# Patient Record
Sex: Female | Born: 1985 | Race: White | Hispanic: No | Marital: Single | Smoking: Never smoker
Health system: Southern US, Community
[De-identification: ages and names within clinical notes are randomized; demographics above are authoritative.]

## PROBLEM LIST (undated history)

## (undated) DIAGNOSIS — L89151 Pressure ulcer of sacral region, stage 1: Secondary | ICD-10-CM

## (undated) DIAGNOSIS — S14109A Unspecified injury at unspecified level of cervical spinal cord, initial encounter: Secondary | ICD-10-CM

## (undated) DIAGNOSIS — L8990 Pressure ulcer of unspecified site, unspecified stage: Secondary | ICD-10-CM

## (undated) DIAGNOSIS — L89302 Pressure ulcer of unspecified buttock, stage 2: Secondary | ICD-10-CM

## (undated) DIAGNOSIS — M6250 Muscle wasting and atrophy, not elsewhere classified, unspecified site: Secondary | ICD-10-CM

## (undated) DIAGNOSIS — A419 Sepsis, unspecified organism: Secondary | ICD-10-CM

## (undated) DIAGNOSIS — M869 Osteomyelitis, unspecified: Secondary | ICD-10-CM

## (undated) DIAGNOSIS — Z95828 Presence of other vascular implants and grafts: Secondary | ICD-10-CM

## (undated) DIAGNOSIS — G904 Autonomic dysreflexia: Secondary | ICD-10-CM

## (undated) DIAGNOSIS — Z85820 Personal history of malignant melanoma of skin: Secondary | ICD-10-CM

## (undated) DIAGNOSIS — Q6602 Congenital talipes equinovarus, left foot: Secondary | ICD-10-CM

## (undated) DIAGNOSIS — N390 Urinary tract infection, site not specified: Secondary | ICD-10-CM

## (undated) DIAGNOSIS — F339 Major depressive disorder, recurrent, unspecified: Secondary | ICD-10-CM

## (undated) DIAGNOSIS — G8250 Quadriplegia, unspecified: Secondary | ICD-10-CM

## (undated) DIAGNOSIS — T83511A Infection and inflammatory reaction due to indwelling urethral catheter, initial encounter: Secondary | ICD-10-CM

## (undated) DIAGNOSIS — R896 Abnormal cytological findings in specimens from other organs, systems and tissues: Secondary | ICD-10-CM

## (undated) DIAGNOSIS — Q631 Lobulated, fused and horseshoe kidney: Secondary | ICD-10-CM

## (undated) DIAGNOSIS — L408 Other psoriasis: Secondary | ICD-10-CM

## (undated) DIAGNOSIS — M41115 Juvenile idiopathic scoliosis, thoracolumbar region: Secondary | ICD-10-CM

## (undated) DIAGNOSIS — Q6601 Congenital talipes equinovarus, right foot: Secondary | ICD-10-CM

## (undated) DIAGNOSIS — IMO0002 Reserved for concepts with insufficient information to code with codable children: Secondary | ICD-10-CM

## (undated) DIAGNOSIS — D509 Iron deficiency anemia, unspecified: Secondary | ICD-10-CM

## (undated) DIAGNOSIS — N915 Oligomenorrhea, unspecified: Secondary | ICD-10-CM

## (undated) DIAGNOSIS — H60313 Diffuse otitis externa, bilateral: Secondary | ICD-10-CM

## (undated) DIAGNOSIS — Z87440 Personal history of urinary (tract) infections: Secondary | ICD-10-CM

## (undated) DIAGNOSIS — D649 Anemia, unspecified: Secondary | ICD-10-CM

## (undated) DIAGNOSIS — M47812 Spondylosis without myelopathy or radiculopathy, cervical region: Secondary | ICD-10-CM

## (undated) DIAGNOSIS — R87619 Unspecified abnormal cytological findings in specimens from cervix uteri: Secondary | ICD-10-CM

## (undated) DIAGNOSIS — M5430 Sciatica, unspecified side: Secondary | ICD-10-CM

## (undated) DIAGNOSIS — N200 Calculus of kidney: Secondary | ICD-10-CM

## (undated) DIAGNOSIS — T148XXA Other injury of unspecified body region, initial encounter: Secondary | ICD-10-CM

## (undated) DIAGNOSIS — Q600 Renal agenesis, unilateral: Secondary | ICD-10-CM

## (undated) DIAGNOSIS — L089 Local infection of the skin and subcutaneous tissue, unspecified: Secondary | ICD-10-CM

## (undated) DIAGNOSIS — M79606 Pain in leg, unspecified: Secondary | ICD-10-CM

## (undated) DIAGNOSIS — G8929 Other chronic pain: Secondary | ICD-10-CM

## (undated) DIAGNOSIS — M2450 Contracture, unspecified joint: Secondary | ICD-10-CM

## (undated) HISTORY — PX: TONSILLECTOMY: SUR1361

## (undated) HISTORY — DX: Abnormal cytological findings in specimens from other organs, systems and tissues: R89.6

## (undated) HISTORY — PX: ORTHOPEDIC SURGERY: SHX850

## (undated) HISTORY — PX: LITHOTRIPSY: SUR834

## (undated) HISTORY — PX: VASCULAR SURGERY: SHX849

## (undated) HISTORY — DX: Personal history of urinary (tract) infections: Z87.440

## (undated) HISTORY — DX: Unspecified abnormal cytological findings in specimens from cervix uteri: R87.619

## (undated) HISTORY — PX: CLUB FOOT RELEASE: SHX1363

## (undated) HISTORY — DX: Reserved for concepts with insufficient information to code with codable children: IMO0002

## (undated) HISTORY — PX: BACK SURGERY: SHX140

---
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
## 2011-01-16 ENCOUNTER — Emergency Department (HOSPITAL_COMMUNITY)
Admission: EM | Admit: 2011-01-16 | Discharge: 2011-01-17 | Payer: Self-pay | Source: Home / Self Care | Admitting: Emergency Medicine

## 2011-01-24 ENCOUNTER — Emergency Department (HOSPITAL_COMMUNITY): Payer: Medicare Other

## 2011-01-24 ENCOUNTER — Emergency Department (HOSPITAL_COMMUNITY)
Admission: EM | Admit: 2011-01-24 | Discharge: 2011-01-24 | Disposition: A | Discharge status: Home / Self Care | Payer: Medicare Other | Attending: Emergency Medicine | Admitting: Emergency Medicine

## 2011-01-24 DIAGNOSIS — J40 Bronchitis, not specified as acute or chronic: Secondary | ICD-10-CM | POA: Insufficient documentation

## 2011-01-24 DIAGNOSIS — R339 Retention of urine, unspecified: Secondary | ICD-10-CM | POA: Insufficient documentation

## 2011-01-24 DIAGNOSIS — R079 Chest pain, unspecified: Secondary | ICD-10-CM | POA: Insufficient documentation

## 2011-01-24 DIAGNOSIS — F410 Panic disorder [episodic paroxysmal anxiety] without agoraphobia: Secondary | ICD-10-CM | POA: Insufficient documentation

## 2011-01-24 DIAGNOSIS — R0602 Shortness of breath: Secondary | ICD-10-CM | POA: Insufficient documentation

## 2011-01-24 LAB — CBC
MCH: 29.3 pg (ref 26.0–34.0)
MCV: 87.2 fL (ref 78.0–100.0)
Platelets: 282 10*3/uL (ref 150–400)
RBC: 4.6 MIL/uL (ref 3.87–5.11)
RDW: 15.1 % (ref 11.5–15.5)
WBC: 6.5 10*3/uL (ref 4.0–10.5)

## 2011-01-24 LAB — BASIC METABOLIC PANEL
BUN: 8 mg/dL (ref 6–23)
Chloride: 103 mEq/L (ref 96–112)
Creatinine, Ser: 0.54 mg/dL (ref 0.4–1.2)
GFR calc Af Amer: >60 mL/min (ref >60)
GFR calc non Af Amer: >60 mL/min (ref >60)

## 2011-01-24 LAB — DIFFERENTIAL
Basophils Relative: 1 % (ref 0–1)
Eosinophils Absolute: 0.3 10*3/uL (ref 0.0–0.7)
Eosinophils Relative: 5 % (ref 0–5)
Monocytes Relative: 6 % (ref 3–12)
Neutrophils Relative %: 51 % (ref 43–77)

## 2011-01-24 LAB — D-DIMER, QUANTITATIVE: D-Dimer, Quant: 1.82 ug/mL-FEU — ABNORMAL HIGH (ref 0.00–0.48)

## 2011-01-25 ENCOUNTER — Emergency Department (HOSPITAL_COMMUNITY): Payer: Medicare Other

## 2011-01-25 ENCOUNTER — Inpatient Hospital Stay (HOSPITAL_COMMUNITY)
Admission: EM | Admit: 2011-01-25 | Discharge: 2011-01-30 | DRG: 202 | Disposition: A | Discharge status: Home Health | Payer: Medicare Other | Attending: Internal Medicine | Admitting: Internal Medicine

## 2011-01-25 DIAGNOSIS — M549 Dorsalgia, unspecified: Secondary | ICD-10-CM | POA: Diagnosis present

## 2011-01-25 DIAGNOSIS — G8929 Other chronic pain: Secondary | ICD-10-CM | POA: Diagnosis present

## 2011-01-25 DIAGNOSIS — L8990 Pressure ulcer of unspecified site, unspecified stage: Secondary | ICD-10-CM | POA: Diagnosis present

## 2011-01-25 DIAGNOSIS — Z9119 Patient's noncompliance with other medical treatment and regimen: Secondary | ICD-10-CM

## 2011-01-25 DIAGNOSIS — L89109 Pressure ulcer of unspecified part of back, unspecified stage: Secondary | ICD-10-CM | POA: Diagnosis present

## 2011-01-25 DIAGNOSIS — J209 Acute bronchitis, unspecified: Principal | ICD-10-CM | POA: Diagnosis present

## 2011-01-25 DIAGNOSIS — Z91199 Patient's noncompliance with other medical treatment and regimen due to unspecified reason: Secondary | ICD-10-CM

## 2011-01-25 DIAGNOSIS — B961 Klebsiella pneumoniae [K. pneumoniae] as the cause of diseases classified elsewhere: Secondary | ICD-10-CM | POA: Diagnosis present

## 2011-01-25 DIAGNOSIS — N319 Neuromuscular dysfunction of bladder, unspecified: Secondary | ICD-10-CM | POA: Diagnosis present

## 2011-01-25 DIAGNOSIS — G8220 Paraplegia, unspecified: Secondary | ICD-10-CM | POA: Diagnosis present

## 2011-01-25 DIAGNOSIS — M5430 Sciatica, unspecified side: Secondary | ICD-10-CM | POA: Diagnosis present

## 2011-01-25 DIAGNOSIS — F411 Generalized anxiety disorder: Secondary | ICD-10-CM | POA: Diagnosis present

## 2011-01-25 DIAGNOSIS — N9089 Other specified noninflammatory disorders of vulva and perineum: Secondary | ICD-10-CM | POA: Diagnosis present

## 2011-01-25 DIAGNOSIS — N390 Urinary tract infection, site not specified: Secondary | ICD-10-CM | POA: Diagnosis present

## 2011-01-25 LAB — DIFFERENTIAL
Eosinophils Absolute: 0.1 10*3/uL (ref 0.0–0.7)
Eosinophils Relative: 1 % (ref 0–5)
Lymphocytes Relative: 17 % (ref 12–46)
Lymphs Abs: 1.6 10*3/uL (ref 0.7–4.0)
Monocytes Absolute: 0.5 10*3/uL (ref 0.1–1.0)
Monocytes Relative: 5 % (ref 3–12)

## 2011-01-25 LAB — URINALYSIS, ROUTINE W REFLEX MICROSCOPIC
Ketones, ur: NEGATIVE mg/dL
Nitrite: NEGATIVE
Protein, ur: 100 mg/dL — AB
pH: 7.5 (ref 5.0–8.0)

## 2011-01-25 LAB — CBC
HCT: 38.5 % (ref 36.0–46.0)
MCH: 29.4 pg (ref 26.0–34.0)
MCHC: 34 g/dL (ref 30.0–36.0)
MCV: 86.3 fL (ref 78.0–100.0)
Platelets: 320 10*3/uL (ref 150–400)
RDW: 15 % (ref 11.5–15.5)

## 2011-01-25 LAB — COMPREHENSIVE METABOLIC PANEL
Albumin: 3.5 g/dL (ref 3.5–5.2)
Alkaline Phosphatase: 82 U/L (ref 39–117)
BUN: 8 mg/dL (ref 6–23)
Calcium: 9.5 mg/dL (ref 8.4–10.5)
Creatinine, Ser: 0.65 mg/dL (ref 0.4–1.2)
Glucose, Bld: 114 mg/dL — ABNORMAL HIGH (ref 70–99)
Potassium: 4 mEq/L (ref 3.5–5.1)
Total Protein: 8 g/dL (ref 6.0–8.3)

## 2011-01-25 LAB — POCT CARDIAC MARKERS: Troponin i, poc: <0.05 ng/mL (ref 0.00–0.09)

## 2011-01-25 LAB — URINE MICROSCOPIC-ADD ON

## 2011-01-25 LAB — POCT PREGNANCY, URINE: Preg Test, Ur: NEGATIVE

## 2011-01-26 LAB — DIFFERENTIAL
Basophils Relative: 0 % (ref 0–1)
Eosinophils Absolute: 0.1 10*3/uL (ref 0.0–0.7)
Eosinophils Relative: 2 % (ref 0–5)
Lymphs Abs: 2.1 10*3/uL (ref 0.7–4.0)
Monocytes Relative: 7 % (ref 3–12)

## 2011-01-26 LAB — CBC
MCH: 28.7 pg (ref 26.0–34.0)
MCV: 87.4 fL (ref 78.0–100.0)
Platelets: 233 10*3/uL (ref 150–400)
RDW: 15.2 % (ref 11.5–15.5)

## 2011-01-27 LAB — COMPREHENSIVE METABOLIC PANEL
ALT: 27 U/L (ref 0–35)
AST: 25 U/L (ref 0–37)
Albumin: 2.7 g/dL — ABNORMAL LOW (ref 3.5–5.2)
Alkaline Phosphatase: 51 U/L (ref 39–117)
BUN: 8 mg/dL (ref 6–23)
GFR calc Af Amer: >60 mL/min (ref >60)
Potassium: 3.6 mEq/L (ref 3.5–5.1)
Sodium: 140 mEq/L (ref 135–145)
Total Protein: 6.3 g/dL (ref 6.0–8.3)

## 2011-01-27 LAB — DIFFERENTIAL
Basophils Absolute: 0 10*3/uL (ref 0.0–0.1)
Basophils Relative: 0 % (ref 0–1)
Eosinophils Absolute: 0.4 10*3/uL (ref 0.0–0.7)
Eosinophils Relative: 6 % — ABNORMAL HIGH (ref 0–5)
Neutrophils Relative %: 52 % (ref 43–77)

## 2011-01-27 LAB — CBC
Platelets: 212 10*3/uL (ref 150–400)
RDW: 15.2 % (ref 11.5–15.5)
WBC: 6.4 10*3/uL (ref 4.0–10.5)

## 2011-01-29 LAB — BASIC METABOLIC PANEL
BUN: 9 mg/dL (ref 6–23)
CO2: 28 mEq/L (ref 19–32)
Calcium: 8.8 mg/dL (ref 8.4–10.5)
Glucose, Bld: 95 mg/dL (ref 70–99)
Potassium: 4.3 mEq/L (ref 3.5–5.1)
Sodium: 142 mEq/L (ref 135–145)

## 2011-01-29 LAB — CBC
HCT: 34.1 % — ABNORMAL LOW (ref 36.0–46.0)
Hemoglobin: 11.3 g/dL — ABNORMAL LOW (ref 12.0–15.0)
MCHC: 33.1 g/dL (ref 30.0–36.0)
MCV: 87.9 fL (ref 78.0–100.0)
RDW: 15.3 % (ref 11.5–15.5)
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
## 2011-02-06 NOTE — Discharge Summary (Signed)
[name], [name]             ACCOUNT NO.:  [id number]  MEDICAL RECORD NO.:  [id number]           PATIENT TYPE:  I  LOCATION:  1332                         FACILITY:  [facility]  PHYSICIAN:  Altha Harm, MDDATE OF BIRTH:  01-Oct-1986  DATE OF ADMISSION:  01/25/2011 DATE OF DISCHARGE:  01/30/2011                              DISCHARGE SUMMARY   DISCHARGE DISPOSITION:  Home.  DISCHARGE DIAGNOSIS: 1. Klebsiella urinary tract infection. 2. Sacral decubitus. 3. Perineal trauma present prehospital felt to be secondary to     intermittent catheterization. 4. Acute bronchitis, initially thought to be a pneumonia. 5. Paraplegic. 6. Neurogenic bladder. 7. Sciatica. 8. Chronic back pain. 9. Anxiety. 10.Medical noncompliance.  DISCHARGE MEDICATIONS:  Include the following: 1. Guaifenesin 600 mg p.o. b.i.d. 2. Bactrim DS one tablet p.o. b.i.d. times 7 days. 3. Advil Migraine one to two tablets p.o. q.6 hours p.r.n. menstrual     pain. 4. Diazepam 10 mg p.o. b.i.d. 5. Dilaudid 4 mg p.o. q.4 hours p.r.n. pain. 6. Enablex 15 mg p.o. daily. 7. MS Contin 30 mg p.o. q.12 hours. 8. Naproxen sodium 220 mg two tablets p.o. q.8 hours p.r.n. pain. 9. Ortho-Novum contraceptive daily as directed. 10.Ambien 10 mg p.o. q.h.s. 11.Zyrtec 10 mg p.o. daily.  CONSULTATIONS:  None.  PROCEDURES:  None.  DIAGNOSTIC STUDIES: 1. CT angiogram of the chest done on February 3 which shows no     pulmonary embolus. Impression: 1. Lower lobe predominant linear opacities felt to represent areas of     scarring. 2. Supple reticulonodular opacities in the upper lobe.  Cannot exclude     mild subacute infection.  Atypical bacteria and viral etiologies     considered. 3. Two-view chest x-ray on February 4 which shows patchy opacities in     the bilateral lower lobes.  Cannot rule out bronchopneumonia.  PERTINENT LABORATORY STUDIES:  Urine culture which shows Klebsiella pneumonia resistant to all  tested medications except for Bactrim, imipenem and gentamicin.  CHIEF COMPLAINT:  Chest pain, nausea and vomiting.  BRIEF HISTORY OF PRESENT ILLNESS:  Please refer to the H and P dictated by Dr. Phineas Douglas for details of the HPI.  However, in short this is a 25- year-old paraplegic who was recently hospitalized and then sent to a nursing home.  The patient left the nursing home against medical advice three days ago to live with her fiance.  She presented with fever, chills, cold sweats, cough, chest pain and emesis.  HOSPITAL COURSE: 1. Hospital-acquired pneumonia versus acute bronchitis.  The patient     actually had presented to the emergency room on the day before     where she had a CT angiogram of her chest done to rule out a PE.     The CT showed areas of atelectasis and scarring in the bilateral     lower lobe.  At the time that she presented on February 4, a chest     x-ray showed opacities in the very same area.  The patient had no     elevation of her white blood cell count, however, and no recorded  fever here in the hospital or in the emergency room.  However,     please note that the patient does take Naprosyn at home which could     have masked the fever.  Nevertheless, the patient had no fever, no     elevation of white blood cell count, but was started on broad-     spectrum antibiotics including vancomycin and Zosyn for an assumed     healthcare-acquired pneumonia.  Additionally, the patient had     wheezing and received nebulizer treatments in the form of     albuterol.  The patient had no increased oxygen requirement.     Initially when she came to the emergency room with her complaints,     oxygen was placed on her.  However, that oxygen was removed within     3 to 4 hours with O2 saturations at 98%.  It is felt, in     retrospect, that the patient likely did not have a pneumonia but     rather had an acute bronchitis with the opacities representing      atelectasis and scarring in a patient who is paraplegic and does     not move or sit in the upright position on a regular basis.     Nevertheless, the patient was treated for 5 days with vancomycin     and Zosyn, mostly due to the fact that she was awaiting     sensitivities for a urinary tract infection.  Presently the patient     has no respiratory distress.  She has no increased work of     breathing.  She has no increased oxygen requirement.  She has no     wheezing and from the standpoint of respiratory the patient is     completely compensated. 2. Urinary tract infection.  The patient had an indwelling Foley that     has been there since January.  The patient does have a neurogenic     bladder secondary to her paraplegia.  However, I do believe that     the patient's symptoms were secondary to her urinary tract     infection which proved to be Klebsiella pneumoniae pan-resistant.     The patient had been on Macrodantin at some point for suppression     of her urinary tract infection.  However, on admission that     medication was not listed on her list.  At this time, I am putting     the patient on Bactrim for a total of 7 days as per the     sensitivities.  I have given the patient a copy of her urine     culture with sensitivities so that she can take this to her     urologist.  I will defer to her urologist to make a determination     as to whether or not the patient needs to go back on prophylactic     antibiotics as she has reported, but currently does not have in her     list of medications. 3. Perineal trauma.  The patient had an indwelling Foley which has     been placed there since January due to the trauma around the     perineum secondary to intermittent catheterization.  The patient is     going to be left on her indwelling Foley and she will follow up     with her urologist and her primary care physician  to further     evaluate when the Foley should be removed. 4.  Chronic pain.  The patient has had multiple back surgeries and     complains of chronic pain.  The patient is on MS Contin and     Dilaudid.  However, here in the hospital she has been demanding IV     Dilaudid.  The patient, however, is being discharged home on her     usual dose of medications and any further adjustments can be made     by her primary care physician Dr. Ladona Ridgel in [place]. 5. Bilateral ischial tuberosity ulcerations.  The patient has     bilateral ischial tuberosity ulcerations which were present pre-     admission.  Wound Ostomy Care was consulted and examined the left     ischial tuberosity.  However, the patient refused examination of     the right ischial tuberosity.  Suggestions from Wound Care were for     the patient have a silver hydrofiber strip with a Monday, Wednesday     and Friday change by a home health R.N. 6. Medical noncompliance.  The patient was noncompliant in terms of     the fact that she left against medical advice from a nursing     facility and has been noncompliant with recommendations from the     medical community.  Additionally, the patient is seeking IV     Dilaudid when she has p.o. Dilaudid.  She refuses to have a     discussion about whether or not the p.o. Dilaudid is sufficiently  controlling her pain but only states that she will only accept IV     Dilaudid.  The patient has refused her p.o. Dilaudid throughout the     night and at this time has asked for a dose of IV Dilaudid.  In     light of the fact that the patient does have chronic pain and takes     p.o. Dilaudid on a regular basis and has been without her p.o.     Dilaudid throughout the night I will give her one dose of IV     Dilaudid 2 mg as a one-time dose prior to her discharge.  However,     the patient will need to further reevaluate her pain medication     needs with her primary care physician.  PHYSICAL EXAMINATION ON DISCHARGE:  VITAL SIGNS:  Temperature  98.9, heart rate 89, blood pressure 127/91, respiratory rate 18, O2 saturations 98% on room air. HEENT EXAMINATION:  The patient is normocephalic, atraumatic.  She has a piercing in the right nostril.  Oropharynx is moist.  No exudate, erythema or lesions are noted. NECK EXAMINATION:  Trachea is midline.  No masses.  No thyromegaly.  No JVD.  No carotid bruit. RESPIRATORY EXAMINATION:  She has a normal respiratory effort.  Equal excursion bilaterally.  No wheezing or rhonchi noted.  CARDIOVASCULAR: She has a normal S1 and S2.  No murmurs, rubs or gallops are noted.  PMI is nondisplaced.  No heaves or thrills on palpation. SKIN:  The patient is refusing examination of her wounds.  They are presently covered with dressing.  DIETARY RESTRICTIONS:  None.  PHYSICAL RESTRICTIONS:  The patient is paraplegic but she will sit up in her chair as much as possible.  FOLLOWUP:  The patient is to follow up with her primary care physician Dr. Caffie Pinto within 3 to 5 days.  She is  also to follow up with her urologist as previously scheduled.  HOME HEALTH FOLLOWUP:  We will order home health nursing for wound care and the recommendation is that the patient should have a silver hydrofiber strip with dressing changes Monday, Wednesday and Friday.  Total time to coordinate this discharge 45 minutes.     Altha Harm, MD     MAM/MEDQ  D:  01/30/2011  T:  01/30/2011  Job:  [id number]  Electronically Signed by Marthann Schiller MD on 02/06/2011 12:59:30 PM

## 2011-02-07 ENCOUNTER — Emergency Department (HOSPITAL_COMMUNITY): Payer: Medicare Other

## 2011-02-07 ENCOUNTER — Emergency Department (HOSPITAL_COMMUNITY)
Admission: EM | Admit: 2011-02-07 | Discharge: 2011-02-07 | Disposition: A | Discharge status: Home / Self Care | Payer: Medicare Other | Attending: Emergency Medicine | Admitting: Emergency Medicine

## 2011-02-07 DIAGNOSIS — R059 Cough, unspecified: Secondary | ICD-10-CM | POA: Insufficient documentation

## 2011-02-07 DIAGNOSIS — R05 Cough: Secondary | ICD-10-CM | POA: Insufficient documentation

## 2011-02-07 DIAGNOSIS — R109 Unspecified abdominal pain: Secondary | ICD-10-CM | POA: Insufficient documentation

## 2011-02-07 DIAGNOSIS — R339 Retention of urine, unspecified: Secondary | ICD-10-CM | POA: Insufficient documentation

## 2011-02-07 DIAGNOSIS — G8220 Paraplegia, unspecified: Secondary | ICD-10-CM | POA: Insufficient documentation

## 2011-02-07 DIAGNOSIS — M4120 Other idiopathic scoliosis, site unspecified: Secondary | ICD-10-CM | POA: Insufficient documentation

## 2011-02-07 DIAGNOSIS — G904 Autonomic dysreflexia: Secondary | ICD-10-CM | POA: Insufficient documentation

## 2011-02-09 ENCOUNTER — Emergency Department (HOSPITAL_COMMUNITY)
Admission: EM | Admit: 2011-02-09 | Discharge: 2011-02-10 | Disposition: A | Discharge status: Home / Self Care | Payer: Medicare Other | Attending: Emergency Medicine | Admitting: Emergency Medicine

## 2011-02-09 DIAGNOSIS — R5383 Other fatigue: Secondary | ICD-10-CM | POA: Insufficient documentation

## 2011-02-09 DIAGNOSIS — G8929 Other chronic pain: Secondary | ICD-10-CM | POA: Insufficient documentation

## 2011-02-09 DIAGNOSIS — R51 Headache: Secondary | ICD-10-CM | POA: Insufficient documentation

## 2011-02-09 DIAGNOSIS — Z79899 Other long term (current) drug therapy: Secondary | ICD-10-CM | POA: Insufficient documentation

## 2011-02-09 DIAGNOSIS — R110 Nausea: Secondary | ICD-10-CM | POA: Insufficient documentation

## 2011-02-09 DIAGNOSIS — G8220 Paraplegia, unspecified: Secondary | ICD-10-CM | POA: Insufficient documentation

## 2011-02-09 DIAGNOSIS — R5381 Other malaise: Secondary | ICD-10-CM | POA: Insufficient documentation

## 2011-02-09 DIAGNOSIS — N390 Urinary tract infection, site not specified: Secondary | ICD-10-CM | POA: Insufficient documentation

## 2011-02-09 LAB — URINALYSIS, ROUTINE W REFLEX MICROSCOPIC
Ketones, ur: NEGATIVE mg/dL
Nitrite: NEGATIVE
Specific Gravity, Urine: 1.02 (ref 1.005–1.030)
pH: 6 (ref 5.0–8.0)

## 2011-02-09 LAB — RAPID URINE DRUG SCREEN, HOSP PERFORMED
Amphetamines: NOT DETECTED
Barbiturates: NOT DETECTED
Benzodiazepines: POSITIVE — AB
Tetrahydrocannabinol: NOT DETECTED

## 2011-02-09 LAB — CBC
HCT: 43.6 % (ref 36.0–46.0)
MCHC: 33.3 g/dL (ref 30.0–36.0)
MCV: 87 fL (ref 78.0–100.0)
Platelets: 375 10*3/uL (ref 150–400)
RDW: 14.8 % (ref 11.5–15.5)

## 2011-02-09 LAB — URINE MICROSCOPIC-ADD ON

## 2011-02-09 LAB — COMPREHENSIVE METABOLIC PANEL
BUN: 12 mg/dL (ref 6–23)
Calcium: 9.5 mg/dL (ref 8.4–10.5)
Creatinine, Ser: 0.68 mg/dL (ref 0.4–1.2)
Glucose, Bld: 98 mg/dL (ref 70–99)
Total Protein: 8.8 g/dL — ABNORMAL HIGH (ref 6.0–8.3)

## 2011-02-09 LAB — DIFFERENTIAL
Eosinophils Absolute: 0.3 10*3/uL (ref 0.0–0.7)
Eosinophils Relative: 4 % (ref 0–5)
Lymphocytes Relative: 37 % (ref 12–46)
Lymphs Abs: 2.9 10*3/uL (ref 0.7–4.0)
Monocytes Absolute: 0.6 10*3/uL (ref 0.1–1.0)

## 2011-02-09 LAB — PREGNANCY, URINE: Preg Test, Ur: NEGATIVE

## 2011-02-11 LAB — URINE CULTURE

## 2011-02-22 ENCOUNTER — Inpatient Hospital Stay (HOSPITAL_COMMUNITY)
Admission: EM | Admit: 2011-02-22 | Discharge: 2011-02-24 | DRG: 392 | Disposition: A | Discharge status: Home Health | Payer: Medicare Other | Attending: Internal Medicine | Admitting: Internal Medicine

## 2011-02-22 DIAGNOSIS — Z87440 Personal history of urinary (tract) infections: Secondary | ICD-10-CM

## 2011-02-22 DIAGNOSIS — Z9889 Other specified postprocedural states: Secondary | ICD-10-CM

## 2011-02-22 DIAGNOSIS — N319 Neuromuscular dysfunction of bladder, unspecified: Secondary | ICD-10-CM | POA: Diagnosis present

## 2011-02-22 DIAGNOSIS — G8220 Paraplegia, unspecified: Secondary | ICD-10-CM | POA: Diagnosis present

## 2011-02-22 DIAGNOSIS — D500 Iron deficiency anemia secondary to blood loss (chronic): Secondary | ICD-10-CM | POA: Diagnosis present

## 2011-02-22 DIAGNOSIS — R112 Nausea with vomiting, unspecified: Secondary | ICD-10-CM | POA: Diagnosis present

## 2011-02-22 DIAGNOSIS — G904 Autonomic dysreflexia: Secondary | ICD-10-CM | POA: Diagnosis present

## 2011-02-22 DIAGNOSIS — Z228 Carrier of other infectious diseases: Secondary | ICD-10-CM

## 2011-02-22 DIAGNOSIS — F411 Generalized anxiety disorder: Secondary | ICD-10-CM | POA: Diagnosis present

## 2011-02-22 DIAGNOSIS — G8929 Other chronic pain: Secondary | ICD-10-CM | POA: Diagnosis present

## 2011-02-22 DIAGNOSIS — M549 Dorsalgia, unspecified: Secondary | ICD-10-CM | POA: Diagnosis present

## 2011-02-22 DIAGNOSIS — R109 Unspecified abdominal pain: Principal | ICD-10-CM | POA: Diagnosis present

## 2011-02-22 LAB — BASIC METABOLIC PANEL
BUN: 14 mg/dL (ref 6–23)
Chloride: 103 mEq/L (ref 96–112)
Glucose, Bld: 78 mg/dL (ref 70–99)
Potassium: 4 mEq/L (ref 3.5–5.1)

## 2011-02-22 LAB — URINALYSIS, ROUTINE W REFLEX MICROSCOPIC
Bilirubin Urine: NEGATIVE
Hgb urine dipstick: NEGATIVE
Specific Gravity, Urine: 1.023 (ref 1.005–1.030)
pH: 6 (ref 5.0–8.0)

## 2011-02-22 LAB — CBC
HCT: 38.3 % (ref 36.0–46.0)
MCV: 90.5 fL (ref 78.0–100.0)
RBC: 4.23 MIL/uL (ref 3.87–5.11)
WBC: 9.1 10*3/uL (ref 4.0–10.5)

## 2011-02-22 LAB — DIFFERENTIAL
Eosinophils Relative: 3 % (ref 0–5)
Lymphocytes Relative: 32 % (ref 12–46)
Lymphs Abs: 2.9 10*3/uL (ref 0.7–4.0)

## 2011-02-22 LAB — URINE MICROSCOPIC-ADD ON

## 2011-02-23 LAB — CBC
Hemoglobin: 10.7 g/dL — ABNORMAL LOW (ref 12.0–15.0)
MCHC: 32.7 g/dL (ref 30.0–36.0)
Platelets: 178 10*3/uL (ref 150–400)
RBC: 3.61 MIL/uL — ABNORMAL LOW (ref 3.87–5.11)

## 2011-02-23 LAB — DIFFERENTIAL
Basophils Absolute: 0 10*3/uL (ref 0.0–0.1)
Basophils Relative: 1 % (ref 0–1)
Monocytes Absolute: 0.5 10*3/uL (ref 0.1–1.0)
Neutro Abs: 1.9 10*3/uL (ref 1.7–7.7)
Neutrophils Relative %: 36 % — ABNORMAL LOW (ref 43–77)

## 2011-02-23 LAB — COMPREHENSIVE METABOLIC PANEL
ALT: 17 U/L (ref 0–35)
AST: 23 U/L (ref 0–37)
Albumin: 2.8 g/dL — ABNORMAL LOW (ref 3.5–5.2)
CO2: 26 mEq/L (ref 19–32)
Calcium: 8.1 mg/dL — ABNORMAL LOW (ref 8.4–10.5)
GFR calc Af Amer: >60 mL/min (ref >60)
GFR calc non Af Amer: >60 mL/min (ref >60)
Sodium: 139 mEq/L (ref 135–145)

## 2011-02-24 LAB — URINALYSIS, DIPSTICK ONLY
Bilirubin Urine: NEGATIVE
Hgb urine dipstick: NEGATIVE
Specific Gravity, Urine: 1.009 (ref 1.005–1.030)
pH: 6.5 (ref 5.0–8.0)

## 2011-02-24 LAB — CBC
HCT: 33.7 % — ABNORMAL LOW (ref 36.0–46.0)
MCH: 29.1 pg (ref 26.0–34.0)
MCHC: 32.3 g/dL (ref 30.0–36.0)
MCV: 90.1 fL (ref 78.0–100.0)
RDW: 14.2 % (ref 11.5–15.5)

## 2011-02-24 LAB — BASIC METABOLIC PANEL
BUN: 9 mg/dL (ref 6–23)
CO2: 26 mEq/L (ref 19–32)
Calcium: 8 mg/dL — ABNORMAL LOW (ref 8.4–10.5)
Creatinine, Ser: 0.55 mg/dL (ref 0.4–1.2)
GFR calc non Af Amer: >60 mL/min (ref >60)
Glucose, Bld: 99 mg/dL (ref 70–99)
Sodium: 137 mEq/L (ref 135–145)

## 2011-02-26 ENCOUNTER — Emergency Department (HOSPITAL_COMMUNITY)
Admission: EM | Admit: 2011-02-26 | Discharge: 2011-02-27 | Disposition: A | Discharge status: Home / Self Care | Payer: Medicare Other | Attending: Emergency Medicine | Admitting: Emergency Medicine

## 2011-02-26 ENCOUNTER — Other Ambulatory Visit (HOSPITAL_COMMUNITY): Payer: Medicare Other

## 2011-02-26 ENCOUNTER — Emergency Department (HOSPITAL_COMMUNITY): Payer: Medicare Other

## 2011-02-26 DIAGNOSIS — R112 Nausea with vomiting, unspecified: Secondary | ICD-10-CM | POA: Insufficient documentation

## 2011-02-26 DIAGNOSIS — N390 Urinary tract infection, site not specified: Secondary | ICD-10-CM | POA: Insufficient documentation

## 2011-02-26 DIAGNOSIS — G8929 Other chronic pain: Secondary | ICD-10-CM | POA: Insufficient documentation

## 2011-02-26 DIAGNOSIS — G904 Autonomic dysreflexia: Secondary | ICD-10-CM | POA: Insufficient documentation

## 2011-02-26 DIAGNOSIS — G8220 Paraplegia, unspecified: Secondary | ICD-10-CM | POA: Insufficient documentation

## 2011-02-26 DIAGNOSIS — F411 Generalized anxiety disorder: Secondary | ICD-10-CM | POA: Insufficient documentation

## 2011-02-26 DIAGNOSIS — R109 Unspecified abdominal pain: Secondary | ICD-10-CM | POA: Insufficient documentation

## 2011-02-26 LAB — URINALYSIS, ROUTINE W REFLEX MICROSCOPIC
Nitrite: NEGATIVE
Protein, ur: NEGATIVE mg/dL
Specific Gravity, Urine: 1.018 (ref 1.005–1.030)
Urobilinogen, UA: 0.2 mg/dL (ref 0.0–1.0)

## 2011-02-26 LAB — BASIC METABOLIC PANEL
BUN: 16 mg/dL (ref 6–23)
CO2: 26 mEq/L (ref 19–32)
Calcium: 9.2 mg/dL (ref 8.4–10.5)
Chloride: 104 mEq/L (ref 96–112)
Creatinine, Ser: 0.72 mg/dL (ref 0.4–1.2)
GFR calc Af Amer: >60 mL/min (ref >60)
Glucose, Bld: 78 mg/dL (ref 70–99)

## 2011-02-26 LAB — CBC
HCT: 35.1 % — ABNORMAL LOW (ref 36.0–46.0)
MCHC: 32.2 g/dL (ref 30.0–36.0)
RDW: 14.8 % (ref 11.5–15.5)
WBC: 7.6 10*3/uL (ref 4.0–10.5)

## 2011-02-26 LAB — DIFFERENTIAL
Basophils Absolute: 0 10*3/uL (ref 0.0–0.1)
Basophils Relative: 1 % (ref 0–1)
Eosinophils Relative: 3 % (ref 0–5)
Lymphocytes Relative: 35 % (ref 12–46)
Neutro Abs: 4.1 10*3/uL (ref 1.7–7.7)

## 2011-02-26 LAB — URINE MICROSCOPIC-ADD ON

## 2011-02-26 LAB — POCT PREGNANCY, URINE: Preg Test, Ur: NEGATIVE

## 2011-02-27 ENCOUNTER — Ambulatory Visit (HOSPITAL_COMMUNITY)
Admission: RE | Admit: 2011-02-27 | Discharge: 2011-02-27 | Disposition: A | Discharge status: Home / Self Care | Payer: Medicare Other | Source: Ambulatory Visit | Attending: Emergency Medicine | Admitting: Emergency Medicine

## 2011-02-27 ENCOUNTER — Other Ambulatory Visit (HOSPITAL_COMMUNITY): Payer: Self-pay | Admitting: Emergency Medicine

## 2011-02-27 DIAGNOSIS — G8250 Quadriplegia, unspecified: Secondary | ICD-10-CM | POA: Insufficient documentation

## 2011-02-27 DIAGNOSIS — R1032 Left lower quadrant pain: Secondary | ICD-10-CM | POA: Insufficient documentation

## 2011-02-27 LAB — URINE CULTURE
Colony Count: >=100000
Colony Count: >=100000
Culture  Setup Time: 201202051117
Special Requests: NEGATIVE

## 2011-02-27 LAB — CULTURE, BLOOD (ROUTINE X 2): Culture  Setup Time: 201202051118
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
## 2011-03-01 NOTE — Consult Note (Signed)
  NAMESHAWNYA, [name]             ACCOUNT NO.:  [id number]  MEDICAL RECORD NO.:  [id number]           PATIENT TYPE:  LOCATION:                                 FACILITY:  PHYSICIAN:  Conley Canal, MD      DATE OF BIRTH:  Oct 08, 1986  DATE OF CONSULTATION: DATE OF DISCHARGE:                                CONSULTATION   PRIMARY CARE PHYSICIAN:  Dr. Florentina Jenny  I was asked by Dr. Ignacia Palma in the emergency room to see [name] who has been seen by the hospitalist service and was discharged on February 24, 2011.  She came in this time with complaints of some abdominal pain and said she has a urinary tract infection.  She complained of right kidney pain which she says is relieved by IV narcotics.  I went over records from the emergency room as well as from recent hospitalization to assess the need for hospitalization.  The patient herself said that she thought she needed to come in as she is convinced she has a UTI; however, there is a history of colonization as she has an indwelling Foley catheter. Since I was seeing her for the first time, I proposed to bring her in to adequately work her up; however, she was demanding IV pain medications which I did not see a justification for.  Because she did not have guarantee for IV pain meds, the patient decided not to get admitted. The emergency room physician also had doubt about her getting admitted, and he had asked [place] to see if it was appropriate to admit her, so at this point the patient is adamant that she will go home.  Her labs today include a white count of 7.6, hemoglobin 11.3, hematocrit 35.1, platelet count 205.  Urinalysis shows nitrites negative, leukocytes large. Microscopy shows bacteria too numerous to count.  Electrolytes are within normal limits, so the patient will sign herself out from the emergency room at this point.     Conley Canal, MD     SR/MEDQ  D:  02/27/2011  T:  02/27/2011  Job:  [id number]  cc:   Dr.  Florentina Jenny  Electronically Signed by Conley Canal  on 03/01/2011 05:08:13 AM

## 2011-03-08 ENCOUNTER — Emergency Department (HOSPITAL_COMMUNITY)
Admission: EM | Admit: 2011-03-08 | Discharge: 2011-03-08 | Disposition: A | Discharge status: Home / Self Care | Payer: Medicare Other | Attending: Emergency Medicine | Admitting: Emergency Medicine

## 2011-03-08 ENCOUNTER — Emergency Department (HOSPITAL_COMMUNITY): Payer: Medicare Other

## 2011-03-08 DIAGNOSIS — G8220 Paraplegia, unspecified: Secondary | ICD-10-CM | POA: Insufficient documentation

## 2011-03-08 DIAGNOSIS — R109 Unspecified abdominal pain: Secondary | ICD-10-CM | POA: Insufficient documentation

## 2011-03-08 DIAGNOSIS — N390 Urinary tract infection, site not specified: Secondary | ICD-10-CM | POA: Insufficient documentation

## 2011-03-08 DIAGNOSIS — K5900 Constipation, unspecified: Secondary | ICD-10-CM | POA: Insufficient documentation

## 2011-03-08 DIAGNOSIS — Q638 Other specified congenital malformations of kidney: Secondary | ICD-10-CM | POA: Insufficient documentation

## 2011-03-08 LAB — DIFFERENTIAL
Basophils Absolute: 0 10*3/uL (ref 0.0–0.1)
Basophils Relative: 0 % (ref 0–1)
Eosinophils Relative: 2 % (ref 0–5)
Monocytes Absolute: 0.5 10*3/uL (ref 0.1–1.0)
Monocytes Relative: 6 % (ref 3–12)
Neutro Abs: 5.7 10*3/uL (ref 1.7–7.7)

## 2011-03-08 LAB — COMPREHENSIVE METABOLIC PANEL
ALT: 21 U/L (ref 0–35)
AST: 25 U/L (ref 0–37)
Alkaline Phosphatase: 114 U/L (ref 39–117)
Glucose, Bld: 82 mg/dL (ref 70–99)
Potassium: 4.7 mEq/L (ref 3.5–5.1)
Sodium: 134 mEq/L — ABNORMAL LOW (ref 135–145)
Total Protein: 7.7 g/dL (ref 6.0–8.3)

## 2011-03-08 LAB — URINALYSIS, ROUTINE W REFLEX MICROSCOPIC
Bilirubin Urine: NEGATIVE
Glucose, UA: NEGATIVE mg/dL
Ketones, ur: NEGATIVE mg/dL
Nitrite: POSITIVE — AB
pH: 7 (ref 5.0–8.0)

## 2011-03-08 LAB — CBC
HCT: 38 % (ref 36.0–46.0)
Hemoglobin: 12.5 g/dL (ref 12.0–15.0)
MCV: 89.4 fL (ref 78.0–100.0)
Platelets: 237 10*3/uL (ref 150–400)

## 2011-03-08 LAB — URINE MICROSCOPIC-ADD ON

## 2011-03-08 LAB — LIPASE, BLOOD: Lipase: 23 U/L (ref 11–59)

## 2011-03-08 MED ORDER — IOHEXOL 300 MG/ML  SOLN
100.0000 mL | Freq: Once | INTRAMUSCULAR | Status: AC | PRN
  Administered 2011-03-08: 100 mL via INTRAVENOUS

## 2011-03-17 LAB — URINE CULTURE

## 2011-03-23 ENCOUNTER — Emergency Department (HOSPITAL_COMMUNITY)
Admission: EM | Admit: 2011-03-23 | Discharge: 2011-03-24 | Disposition: A | Discharge status: Home / Self Care | Payer: Medicare Other | Attending: Emergency Medicine | Admitting: Emergency Medicine

## 2011-03-23 DIAGNOSIS — G8929 Other chronic pain: Secondary | ICD-10-CM | POA: Insufficient documentation

## 2011-03-23 DIAGNOSIS — F411 Generalized anxiety disorder: Secondary | ICD-10-CM | POA: Insufficient documentation

## 2011-03-23 DIAGNOSIS — N390 Urinary tract infection, site not specified: Secondary | ICD-10-CM | POA: Insufficient documentation

## 2011-03-23 DIAGNOSIS — Z79899 Other long term (current) drug therapy: Secondary | ICD-10-CM | POA: Insufficient documentation

## 2011-03-23 DIAGNOSIS — G904 Autonomic dysreflexia: Secondary | ICD-10-CM | POA: Insufficient documentation

## 2011-03-23 DIAGNOSIS — N898 Other specified noninflammatory disorders of vagina: Secondary | ICD-10-CM | POA: Insufficient documentation

## 2011-03-23 DIAGNOSIS — R079 Chest pain, unspecified: Secondary | ICD-10-CM | POA: Insufficient documentation

## 2011-03-23 DIAGNOSIS — M549 Dorsalgia, unspecified: Secondary | ICD-10-CM | POA: Insufficient documentation

## 2011-03-23 DIAGNOSIS — R10819 Abdominal tenderness, unspecified site: Secondary | ICD-10-CM | POA: Insufficient documentation

## 2011-03-23 DIAGNOSIS — G8250 Quadriplegia, unspecified: Secondary | ICD-10-CM | POA: Insufficient documentation

## 2011-03-23 DIAGNOSIS — R112 Nausea with vomiting, unspecified: Secondary | ICD-10-CM | POA: Insufficient documentation

## 2011-03-23 DIAGNOSIS — R109 Unspecified abdominal pain: Secondary | ICD-10-CM | POA: Insufficient documentation

## 2011-03-23 LAB — BASIC METABOLIC PANEL
BUN: 10 mg/dL (ref 6–23)
Chloride: 106 mEq/L (ref 96–112)
Creatinine, Ser: 0.52 mg/dL (ref 0.4–1.2)
GFR calc Af Amer: >60 mL/min (ref >60)
GFR calc non Af Amer: >60 mL/min (ref >60)

## 2011-03-23 LAB — PREGNANCY, URINE: Preg Test, Ur: NEGATIVE

## 2011-03-23 LAB — URINALYSIS, ROUTINE W REFLEX MICROSCOPIC
Bilirubin Urine: NEGATIVE
Glucose, UA: NEGATIVE mg/dL
Specific Gravity, Urine: 1.021 (ref 1.005–1.030)
pH: 6.5 (ref 5.0–8.0)

## 2011-03-23 LAB — URINE MICROSCOPIC-ADD ON

## 2011-03-23 LAB — CBC
MCH: 29.9 pg (ref 26.0–34.0)
MCV: 87.7 fL (ref 78.0–100.0)
Platelets: 215 10*3/uL (ref 150–400)
RBC: 3.98 MIL/uL (ref 3.87–5.11)
RDW: 13.5 % (ref 11.5–15.5)
WBC: 6.7 10*3/uL (ref 4.0–10.5)

## 2011-03-23 LAB — DIFFERENTIAL
Basophils Relative: 0 % (ref 0–1)
Eosinophils Absolute: 0.3 10*3/uL (ref 0.0–0.7)
Eosinophils Relative: 4 % (ref 0–5)
Neutrophils Relative %: 53 % (ref 43–77)

## 2011-03-27 LAB — URINE CULTURE: Colony Count: >=100000

## 2011-04-03 NOTE — H&P (Signed)
[name], [name]             ACCOUNT NO.:  [id number]  MEDICAL RECORD NO.:  [id number]           PATIENT TYPE:  E  LOCATION:  WLED                         FACILITY:  [facility]  PHYSICIAN:  Massie Maroon, MD        DATE OF BIRTH:  16-Jul-1986  DATE OF ADMISSION:  02/22/2011 DATE OF DISCHARGE:                             HISTORY & PHYSICAL   CHIEF COMPLAINT:  Bilateral flank pain.  HISTORY OF PRESENT ILLNESS:  A 25 year old female with a history of urinary retention, autonomic dysreflexia, indwelling Foley catheter, horseshoe kidney apparently complains of flank pain starting tonight. Her urine has been dark and cloudy today but she denies any fever, chills, dysuria.  She has had some slight nausea and vomiting and has been unable to keep her pills down.  The patient will be admitted for workup of UTI/pyelonephritis.  PAST MEDICAL HISTORY: 1. Urinary retention. 2. Autonomic dysreflexia 3. Chronic back pain. 4. Scoliosis with rods in the back. 5. C4-5 fracture. 6. Anxiety. 7. Sciatica.  PAST SURGICAL HISTORY: 1. Back surgery. 2. Neck surgery. 3. Bilateral lower extremity surgery multiple times as a child for leg     deformities.  SOCIAL HISTORY:  The patient does not drink or smoke.  She is living on Social Security Disability.  She has no children.  She was born in [place], [place].  Code status full code, able to move her arms and legs.  Able to take a few steps with assistance.  FAMILY HISTORY:  Positive for diabetes, stroke, heart disease and cancer.  ALLERGIES: 1. BENADRYL. 2. CIPRO. 3. REGLAN.  MEDICATIONS: 1. Zyrtec. 2. Protonix 40 mg daily p.r.n. 3. Ambien 10 mg p.o. at bedtime 4. Nitrofurantoin 50 mg p.o. daily. 5. Promethazine 25 mg p.o. q.6h. p.r.n. 6. Dilaudid 8 mg p.o. q.6 h. p.r.n. 7. Diazepam 10 mg p.o. q.a.m. and at bedtime 8. Enablex 15 mg p.o. q.a.m. 9. Morphine sulfate 30 mg p.o. b.i.d. 10.Nortrel 7/7/7 one p.o. daily.  REVIEW OF  SYSTEMS:  Negative for all 10 organ systems except for pertinent positives stated above.  PHYSICAL EXAMINATION:  VITAL SIGNS:  Temperature 98.0, pulse 90, blood pressure 124/73, pulse ox 99% on room air. HEENT:  Anicteric. NECK:  No JVD, no bruit, scar anteriorly and posteriorly. HEART:  Regular rate and rhythm.  S1, S2.  No murmurs, gallops or rubs. LUNGS:  Clear to auscultation bilaterally. ABDOMEN:  Soft, nontender, nondistended.  Positive bowel sounds. EXTREMITIES:  No cyanosis, clubbing or edema. SKIN:  No rashes. LYMPH NODES:  No adenopathy. NEURO EXAM:  Weakness in all 4 extremities, however, she is able move them.  Bilateral CVA tenderness.  LABORATORY DATA:  Urinalysis, wbc's 21-58, rbc's 0-2, nitrite positive, leukocyte esterase large.  Urine pregnancy test negative.  Sodium 137, potassium 4.0, BUN 14, creatinine 0.46.  WBC 9.1, hemoglobin 12.2, platelet count 201.  ASSESSMENT/PLAN: 1. Nausea, vomiting secondary to pyelonephritis.  We will treat with     Zofran and Phenergan symptomatically. 2. Pyelonephritis:  Ceftriaxone 1 g IV daily, await urine cultures     before tailoring antibiotics. 3. Chronic back pain.  Continue morphine,  continue Dilaudid as needed.     Continue diazepam for muscle spasm. 4. Urinary retention.  Continue Enablex. 5. Insomnia.  Continue Zolpidem. 6. Deep venous thrombosis prophylaxis, SCDs.     Massie Maroon, MD     JYK/MEDQ  D:  02/22/2011  T:  02/23/2011  Job:  [id number]  cc:   Florentina Jenny, MD  Dr. Holley Bouche  Electronically Signed by Pearson Grippe MD on 04/03/2011 12:56:38 AM

## 2011-04-08 ENCOUNTER — Inpatient Hospital Stay (HOSPITAL_COMMUNITY)
Admission: EM | Admit: 2011-04-08 | Discharge: 2011-04-11 | DRG: 394 | Disposition: A | Discharge status: Home / Self Care | Payer: Medicare Other | Attending: Internal Medicine | Admitting: Internal Medicine

## 2011-04-08 DIAGNOSIS — R339 Retention of urine, unspecified: Secondary | ICD-10-CM | POA: Diagnosis present

## 2011-04-08 DIAGNOSIS — N319 Neuromuscular dysfunction of bladder, unspecified: Secondary | ICD-10-CM | POA: Diagnosis present

## 2011-04-08 DIAGNOSIS — K602 Anal fissure, unspecified: Principal | ICD-10-CM | POA: Diagnosis present

## 2011-04-08 DIAGNOSIS — K5900 Constipation, unspecified: Secondary | ICD-10-CM | POA: Diagnosis present

## 2011-04-08 DIAGNOSIS — E876 Hypokalemia: Secondary | ICD-10-CM | POA: Diagnosis present

## 2011-04-08 DIAGNOSIS — G8220 Paraplegia, unspecified: Secondary | ICD-10-CM | POA: Diagnosis present

## 2011-04-08 DIAGNOSIS — N390 Urinary tract infection, site not specified: Secondary | ICD-10-CM | POA: Diagnosis present

## 2011-04-08 DIAGNOSIS — L89209 Pressure ulcer of unspecified hip, unspecified stage: Secondary | ICD-10-CM | POA: Diagnosis present

## 2011-04-08 DIAGNOSIS — G904 Autonomic dysreflexia: Secondary | ICD-10-CM | POA: Diagnosis present

## 2011-04-08 DIAGNOSIS — F411 Generalized anxiety disorder: Secondary | ICD-10-CM | POA: Diagnosis present

## 2011-04-08 DIAGNOSIS — L8990 Pressure ulcer of unspecified site, unspecified stage: Secondary | ICD-10-CM | POA: Diagnosis present

## 2011-04-08 DIAGNOSIS — D6489 Other specified anemias: Secondary | ICD-10-CM | POA: Diagnosis present

## 2011-04-08 DIAGNOSIS — M86659 Other chronic osteomyelitis, unspecified thigh: Secondary | ICD-10-CM | POA: Diagnosis present

## 2011-04-08 LAB — CBC
HCT: 35.8 % — ABNORMAL LOW (ref 36.0–46.0)
HCT: 33.8 % — ABNORMAL LOW (ref 36.0–46.0)
Hemoglobin: 11.8 g/dL — ABNORMAL LOW (ref 12.0–15.0)
Hemoglobin: 11.1 g/dL — ABNORMAL LOW (ref 12.0–15.0)
Hemoglobin: 11 g/dL — ABNORMAL LOW (ref 12.0–15.0)
MCH: 28.5 pg (ref 26.0–34.0)
MCH: 28.9 pg (ref 26.0–34.0)
MCV: 86.5 fL (ref 78.0–100.0)
MCV: 87.5 fL (ref 78.0–100.0)
MCV: 86.7 fL (ref 78.0–100.0)
RBC: 4.14 MIL/uL (ref 3.87–5.11)
RBC: 3.84 MIL/uL — ABNORMAL LOW (ref 3.87–5.11)
RDW: 12.9 % (ref 11.5–15.5)
WBC: 6 10*3/uL (ref 4.0–10.5)
WBC: 6.6 10*3/uL (ref 4.0–10.5)

## 2011-04-08 LAB — IRON AND TIBC
Iron: 25 ug/dL — ABNORMAL LOW (ref 42–135)
TIBC: 284 ug/dL (ref 250–470)

## 2011-04-08 LAB — DIFFERENTIAL
Eosinophils Absolute: 0.3 10*3/uL (ref 0.0–0.7)
Lymphocytes Relative: 33 % (ref 12–46)
Lymphs Abs: 2.5 10*3/uL (ref 0.7–4.0)
Monocytes Relative: 10 % (ref 3–12)
Neutro Abs: 3.9 10*3/uL (ref 1.7–7.7)
Neutrophils Relative %: 52 % (ref 43–77)

## 2011-04-08 LAB — BASIC METABOLIC PANEL
BUN: 11 mg/dL (ref 6–23)
CO2: 22 mEq/L (ref 19–32)
Chloride: 105 mEq/L (ref 96–112)
Creatinine, Ser: 0.67 mg/dL (ref 0.4–1.2)
Glucose, Bld: 99 mg/dL (ref 70–99)
Potassium: 3.7 mEq/L (ref 3.5–5.1)

## 2011-04-08 LAB — HEPATIC FUNCTION PANEL
ALT: 19 U/L (ref 0–35)
AST: 20 U/L (ref 0–37)
Bilirubin, Direct: <0.1 mg/dL (ref 0.0–0.3)
Total Protein: 6.1 g/dL (ref 6.0–8.3)

## 2011-04-08 LAB — VITAMIN B12: Vitamin B-12: 300 pg/mL (ref 211–911)

## 2011-04-08 LAB — MRSA PCR SCREENING: MRSA by PCR: NEGATIVE

## 2011-04-08 LAB — GLUCOSE, CAPILLARY: Glucose-Capillary: 82 mg/dL (ref 70–99)

## 2011-04-09 ENCOUNTER — Inpatient Hospital Stay (HOSPITAL_COMMUNITY): Payer: Medicare Other

## 2011-04-09 ENCOUNTER — Encounter (HOSPITAL_COMMUNITY): Payer: Self-pay | Admitting: Radiology

## 2011-04-09 LAB — CBC
HCT: 42.2 % (ref 36.0–46.0)
HCT: 44.1 % (ref 36.0–46.0)
HCT: 40.4 % (ref 36.0–46.0)
Hemoglobin: 14.8 g/dL (ref 12.0–15.0)
Hemoglobin: 15.3 g/dL — ABNORMAL HIGH (ref 12.0–15.0)
MCH: 29.4 pg (ref 26.0–34.0)
MCH: 29.1 pg (ref 26.0–34.0)
MCHC: 34.7 g/dL (ref 30.0–36.0)
MCHC: 34.9 g/dL (ref 30.0–36.0)
MCV: 83.7 fL (ref 78.0–100.0)
MCV: 84 fL (ref 78.0–100.0)
MCV: 84 fL (ref 78.0–100.0)
Platelets: 281 10*3/uL (ref 150–400)
RBC: 5.04 MIL/uL (ref 3.87–5.11)
RDW: 12.7 % (ref 11.5–15.5)
RDW: 12.7 % (ref 11.5–15.5)
WBC: 13.6 10*3/uL — ABNORMAL HIGH (ref 4.0–10.5)

## 2011-04-09 LAB — BASIC METABOLIC PANEL
BUN: 7 mg/dL (ref 6–23)
BUN: 6 mg/dL (ref 6–23)
CO2: 16 mEq/L — ABNORMAL LOW (ref 19–32)
Calcium: 8.6 mg/dL (ref 8.4–10.5)
Creatinine, Ser: 0.47 mg/dL (ref 0.4–1.2)
GFR calc non Af Amer: >60 mL/min (ref >60)
GFR calc non Af Amer: >60 mL/min (ref >60)
Glucose, Bld: 120 mg/dL — ABNORMAL HIGH (ref 70–99)
Glucose, Bld: 91 mg/dL (ref 70–99)
Potassium: 4.3 mEq/L (ref 3.5–5.1)
Sodium: 137 mEq/L (ref 135–145)

## 2011-04-09 MED ORDER — IOHEXOL 300 MG/ML  SOLN
80.0000 mL | Freq: Once | INTRAMUSCULAR | Status: AC | PRN
  Administered 2011-04-09: 80 mL via INTRAVENOUS
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
## 2011-04-10 ENCOUNTER — Inpatient Hospital Stay (HOSPITAL_COMMUNITY): Payer: Medicare Other

## 2011-04-10 LAB — COMPREHENSIVE METABOLIC PANEL
ALT: 15 U/L (ref 0–35)
AST: 17 U/L (ref 0–37)
Albumin: 2.4 g/dL — ABNORMAL LOW (ref 3.5–5.2)
Alkaline Phosphatase: 161 U/L — ABNORMAL HIGH (ref 39–117)
CO2: 20 mEq/L (ref 19–32)
Chloride: 112 mEq/L (ref 96–112)
Creatinine, Ser: 0.7 mg/dL (ref 0.4–1.2)
GFR calc Af Amer: >60 mL/min (ref >60)
GFR calc non Af Amer: >60 mL/min (ref >60)
Potassium: 3.2 mEq/L — ABNORMAL LOW (ref 3.5–5.1)
Total Bilirubin: 0.5 mg/dL (ref 0.3–1.2)

## 2011-04-10 LAB — CBC
Hemoglobin: 10.4 g/dL — ABNORMAL LOW (ref 12.0–15.0)
MCH: 27.8 pg (ref 26.0–34.0)
Platelets: 242 10*3/uL (ref 150–400)
RBC: 3.74 MIL/uL — ABNORMAL LOW (ref 3.87–5.11)
WBC: 9.7 10*3/uL (ref 4.0–10.5)

## 2011-04-11 ENCOUNTER — Inpatient Hospital Stay (HOSPITAL_COMMUNITY): Payer: Medicare Other

## 2011-04-11 LAB — CBC
Hemoglobin: 10.9 g/dL — ABNORMAL LOW (ref 12.0–15.0)
MCH: 28.2 pg (ref 26.0–34.0)
MCH: 28.6 pg (ref 26.0–34.0)
MCHC: 32.6 g/dL (ref 30.0–36.0)
MCHC: 33.9 g/dL (ref 30.0–36.0)
Platelets: 237 10*3/uL (ref 150–400)
Platelets: 273 10*3/uL (ref 150–400)
RBC: 3.98 MIL/uL (ref 3.87–5.11)
RDW: 13.3 % (ref 11.5–15.5)

## 2011-04-11 LAB — CROSSMATCH: Unit division: 0

## 2011-04-11 LAB — BASIC METABOLIC PANEL
Calcium: 8.1 mg/dL — ABNORMAL LOW (ref 8.4–10.5)
Creatinine, Ser: 0.53 mg/dL (ref 0.4–1.2)
GFR calc Af Amer: >60 mL/min (ref >60)
GFR calc non Af Amer: >60 mL/min (ref >60)
Sodium: 137 mEq/L (ref 135–145)

## 2011-04-11 LAB — MAGNESIUM: Magnesium: 1.8 mg/dL (ref 1.5–2.5)
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
## 2011-04-19 NOTE — Discharge Summary (Signed)
  [name], [name]             ACCOUNT NO.:  [id number]  MEDICAL RECORD NO.:  [id number]           PATIENT TYPE:  LOCATION:                                 FACILITY:  PHYSICIAN:  Baltazar Najjar, MD     DATE OF BIRTH:  02-22-1986  DATE OF ADMISSION: DATE OF DISCHARGE:                              DISCHARGE SUMMARY   ADDENDUM:  Addendum to discharge medication.  DISCHARGE MEDICATIONS: 1. Cardizem gel 2% rectally 3 times a day for 30 days. 2. MiraLax powder 17 g p.o. daily as needed. 3. Senokot-S 2 tablets p.o. daily for constipation.          ______________________________ Baltazar Najjar, MD     SA/MEDQ  D:  04/11/2011  T:  04/11/2011  Job:  [id number]  Electronically Signed by Hannah Beat MD on 04/19/2011 07:46:10 PM

## 2011-04-26 ENCOUNTER — Emergency Department (HOSPITAL_COMMUNITY): Payer: Medicare Other

## 2011-04-26 ENCOUNTER — Emergency Department (HOSPITAL_COMMUNITY)
Admission: EM | Admit: 2011-04-26 | Discharge: 2011-04-27 | Disposition: A | Discharge status: Home / Self Care | Payer: Medicare Other | Attending: Emergency Medicine | Admitting: Emergency Medicine

## 2011-04-26 DIAGNOSIS — S93609A Unspecified sprain of unspecified foot, initial encounter: Secondary | ICD-10-CM | POA: Insufficient documentation

## 2011-04-26 DIAGNOSIS — M79609 Pain in unspecified limb: Secondary | ICD-10-CM | POA: Insufficient documentation

## 2011-04-26 DIAGNOSIS — X58XXXA Exposure to other specified factors, initial encounter: Secondary | ICD-10-CM | POA: Insufficient documentation

## 2011-04-26 DIAGNOSIS — G8250 Quadriplegia, unspecified: Secondary | ICD-10-CM | POA: Insufficient documentation

## 2011-04-26 DIAGNOSIS — M4120 Other idiopathic scoliosis, site unspecified: Secondary | ICD-10-CM | POA: Insufficient documentation

## 2011-05-05 ENCOUNTER — Emergency Department (HOSPITAL_COMMUNITY)
Admission: EM | Admit: 2011-05-05 | Discharge: 2011-05-05 | Disposition: A | Discharge status: Home / Self Care | Payer: Medicare Other | Attending: Emergency Medicine | Admitting: Emergency Medicine

## 2011-05-05 DIAGNOSIS — T83091A Other mechanical complication of indwelling urethral catheter, initial encounter: Secondary | ICD-10-CM | POA: Insufficient documentation

## 2011-05-05 DIAGNOSIS — G8250 Quadriplegia, unspecified: Secondary | ICD-10-CM | POA: Insufficient documentation

## 2011-05-05 DIAGNOSIS — Y846 Urinary catheterization as the cause of abnormal reaction of the patient, or of later complication, without mention of misadventure at the time of the procedure: Secondary | ICD-10-CM | POA: Insufficient documentation

## 2011-05-05 DIAGNOSIS — N390 Urinary tract infection, site not specified: Secondary | ICD-10-CM | POA: Insufficient documentation

## 2011-05-05 LAB — URINALYSIS, ROUTINE W REFLEX MICROSCOPIC
Bilirubin Urine: NEGATIVE
Glucose, UA: NEGATIVE mg/dL
Ketones, ur: NEGATIVE mg/dL
Nitrite: NEGATIVE
Protein, ur: NEGATIVE mg/dL
Specific Gravity, Urine: 1.008 (ref 1.005–1.030)
Urobilinogen, UA: 1 mg/dL (ref 0.0–1.0)
pH: 6.5 (ref 5.0–8.0)

## 2011-05-05 LAB — URINE MICROSCOPIC-ADD ON

## 2011-05-05 LAB — POCT PREGNANCY, URINE: Preg Test, Ur: NEGATIVE

## 2011-05-06 LAB — URINE CULTURE
Colony Count: 40000
Culture  Setup Time: 201205141459

## 2011-05-12 ENCOUNTER — Emergency Department (HOSPITAL_COMMUNITY): Payer: Medicare Other

## 2011-05-12 ENCOUNTER — Observation Stay (HOSPITAL_COMMUNITY)
Admission: EM | Admit: 2011-05-12 | Discharge: 2011-05-13 | Disposition: A | Discharge status: Home / Self Care | Payer: Medicare Other | Attending: Emergency Medicine | Admitting: Emergency Medicine

## 2011-05-12 DIAGNOSIS — N12 Tubulo-interstitial nephritis, not specified as acute or chronic: Principal | ICD-10-CM | POA: Insufficient documentation

## 2011-05-12 DIAGNOSIS — R109 Unspecified abdominal pain: Secondary | ICD-10-CM | POA: Insufficient documentation

## 2011-05-12 LAB — URINALYSIS, ROUTINE W REFLEX MICROSCOPIC
Glucose, UA: NEGATIVE mg/dL
Hgb urine dipstick: NEGATIVE
Specific Gravity, Urine: 1.022 (ref 1.005–1.030)

## 2011-05-12 LAB — COMPREHENSIVE METABOLIC PANEL
ALT: 11 U/L (ref 0–35)
AST: 18 U/L (ref 0–37)
Albumin: 3.6 g/dL (ref 3.5–5.2)
Alkaline Phosphatase: 133 U/L — ABNORMAL HIGH (ref 39–117)
BUN: 17 mg/dL (ref 6–23)
GFR calc Af Amer: >60 mL/min (ref >60)
Potassium: 4.5 mEq/L (ref 3.5–5.1)
Sodium: 135 mEq/L (ref 135–145)
Total Protein: 8.1 g/dL (ref 6.0–8.3)

## 2011-05-12 LAB — DIFFERENTIAL
Lymphs Abs: 0.9 10*3/uL (ref 0.7–4.0)
Monocytes Absolute: 0.7 10*3/uL (ref 0.1–1.0)
Monocytes Relative: 3 % (ref 3–12)
Neutro Abs: 20.9 10*3/uL — ABNORMAL HIGH (ref 1.7–7.7)
Neutrophils Relative %: 93 % — ABNORMAL HIGH (ref 43–77)

## 2011-05-12 LAB — CBC
HCT: 38.4 % (ref 36.0–46.0)
Hemoglobin: 13.2 g/dL (ref 12.0–15.0)
MCH: 28.6 pg (ref 26.0–34.0)
MCHC: 34.4 g/dL (ref 30.0–36.0)
MCV: 83.1 fL (ref 78.0–100.0)
RBC: 4.62 MIL/uL (ref 3.87–5.11)

## 2011-05-12 LAB — LACTIC ACID, PLASMA: Lactic Acid, Venous: 1.3 mmol/L (ref 0.5–2.2)

## 2011-05-12 LAB — URINE MICROSCOPIC-ADD ON

## 2011-05-13 LAB — DIFFERENTIAL
Eosinophils Relative: 0 % (ref 0–5)
Lymphocytes Relative: 7 % — ABNORMAL LOW (ref 12–46)
Lymphs Abs: 1.1 10*3/uL (ref 0.7–4.0)
Monocytes Relative: 2 % — ABNORMAL LOW (ref 3–12)

## 2011-05-13 LAB — BASIC METABOLIC PANEL
CO2: 23 mEq/L (ref 19–32)
Chloride: 100 mEq/L (ref 96–112)
GFR calc Af Amer: >60 mL/min (ref >60)
Potassium: 4 mEq/L (ref 3.5–5.1)
Sodium: 135 mEq/L (ref 135–145)

## 2011-05-13 LAB — CBC
Hemoglobin: 10.9 g/dL — ABNORMAL LOW (ref 12.0–15.0)
MCH: 27.2 pg (ref 26.0–34.0)
RBC: 4.01 MIL/uL (ref 3.87–5.11)
WBC: 16.9 10*3/uL — ABNORMAL HIGH (ref 4.0–10.5)

## 2011-05-13 LAB — PREGNANCY, URINE: Preg Test, Ur: NEGATIVE

## 2011-05-14 LAB — URINE CULTURE
Colony Count: >=100000
Culture  Setup Time: 201205212140

## 2011-06-06 ENCOUNTER — Emergency Department (HOSPITAL_COMMUNITY)
Admission: EM | Admit: 2011-06-06 | Discharge: 2011-06-07 | Disposition: A | Discharge status: Home / Self Care | Payer: Medicare Other | Attending: Emergency Medicine | Admitting: Emergency Medicine

## 2011-06-06 ENCOUNTER — Emergency Department (HOSPITAL_COMMUNITY): Payer: Medicare Other

## 2011-06-06 DIAGNOSIS — G43909 Migraine, unspecified, not intractable, without status migrainosus: Secondary | ICD-10-CM | POA: Insufficient documentation

## 2011-06-06 DIAGNOSIS — G8250 Quadriplegia, unspecified: Secondary | ICD-10-CM | POA: Insufficient documentation

## 2011-06-06 DIAGNOSIS — H5710 Ocular pain, unspecified eye: Secondary | ICD-10-CM | POA: Insufficient documentation

## 2011-06-16 ENCOUNTER — Emergency Department (HOSPITAL_COMMUNITY)
Admission: EM | Admit: 2011-06-16 | Discharge: 2011-06-16 | Disposition: A | Discharge status: Home / Self Care | Payer: Medicare Other | Attending: Emergency Medicine | Admitting: Emergency Medicine

## 2011-06-16 DIAGNOSIS — R112 Nausea with vomiting, unspecified: Secondary | ICD-10-CM | POA: Insufficient documentation

## 2011-06-16 DIAGNOSIS — N390 Urinary tract infection, site not specified: Secondary | ICD-10-CM | POA: Insufficient documentation

## 2011-06-16 DIAGNOSIS — G8250 Quadriplegia, unspecified: Secondary | ICD-10-CM | POA: Insufficient documentation

## 2011-06-16 LAB — DIFFERENTIAL
Basophils Absolute: 0.1 10*3/uL (ref 0.0–0.1)
Lymphs Abs: 2.9 10*3/uL (ref 0.7–4.0)
Monocytes Absolute: 0.5 10*3/uL (ref 0.1–1.0)
Neutro Abs: 2.8 10*3/uL (ref 1.7–7.7)

## 2011-06-16 LAB — URINALYSIS, ROUTINE W REFLEX MICROSCOPIC
Glucose, UA: NEGATIVE mg/dL
Ketones, ur: NEGATIVE mg/dL
Protein, ur: NEGATIVE mg/dL
Urobilinogen, UA: 1 mg/dL (ref 0.0–1.0)

## 2011-06-16 LAB — BASIC METABOLIC PANEL
BUN: 9 mg/dL (ref 6–23)
Creatinine, Ser: 0.57 mg/dL (ref 0.50–1.10)
GFR calc Af Amer: >60 mL/min (ref >60)
GFR calc non Af Amer: >60 mL/min (ref >60)
Glucose, Bld: 86 mg/dL (ref 70–99)

## 2011-06-16 LAB — CBC
HCT: 33.3 % — ABNORMAL LOW (ref 36.0–46.0)
MCH: 27 pg (ref 26.0–34.0)
MCHC: 32.1 g/dL (ref 30.0–36.0)
RDW: 15.3 % (ref 11.5–15.5)

## 2011-06-16 LAB — URINE MICROSCOPIC-ADD ON

## 2011-06-16 LAB — POCT PREGNANCY, URINE: Preg Test, Ur: NEGATIVE

## 2011-06-19 LAB — URINE CULTURE: Culture  Setup Time: 201206260132

## 2011-07-02 ENCOUNTER — Emergency Department (HOSPITAL_COMMUNITY)
Admission: EM | Admit: 2011-07-02 | Discharge: 2011-07-02 | Disposition: A | Discharge status: Home / Self Care | Payer: Medicare Other | Attending: Emergency Medicine | Admitting: Emergency Medicine

## 2011-07-02 DIAGNOSIS — R1110 Vomiting, unspecified: Secondary | ICD-10-CM | POA: Insufficient documentation

## 2011-07-02 DIAGNOSIS — G8250 Quadriplegia, unspecified: Secondary | ICD-10-CM | POA: Insufficient documentation

## 2011-07-02 DIAGNOSIS — R51 Headache: Secondary | ICD-10-CM | POA: Insufficient documentation

## 2011-07-02 DIAGNOSIS — M4120 Other idiopathic scoliosis, site unspecified: Secondary | ICD-10-CM | POA: Insufficient documentation

## 2011-07-22 ENCOUNTER — Emergency Department (HOSPITAL_COMMUNITY)
Admission: EM | Admit: 2011-07-22 | Discharge: 2011-07-23 | Disposition: A | Discharge status: Home / Self Care | Payer: Medicare Other | Attending: Emergency Medicine | Admitting: Emergency Medicine

## 2011-07-22 DIAGNOSIS — R109 Unspecified abdominal pain: Secondary | ICD-10-CM | POA: Insufficient documentation

## 2011-07-22 DIAGNOSIS — G8250 Quadriplegia, unspecified: Secondary | ICD-10-CM | POA: Insufficient documentation

## 2011-07-22 DIAGNOSIS — R112 Nausea with vomiting, unspecified: Secondary | ICD-10-CM | POA: Insufficient documentation

## 2011-07-22 DIAGNOSIS — N390 Urinary tract infection, site not specified: Secondary | ICD-10-CM | POA: Insufficient documentation

## 2011-07-23 LAB — URINALYSIS, ROUTINE W REFLEX MICROSCOPIC
Bilirubin Urine: NEGATIVE
Glucose, UA: NEGATIVE mg/dL
Hgb urine dipstick: NEGATIVE
Specific Gravity, Urine: 1.016 (ref 1.005–1.030)
Urobilinogen, UA: 1 mg/dL (ref 0.0–1.0)
pH: 7 (ref 5.0–8.0)

## 2011-07-23 LAB — URINE MICROSCOPIC-ADD ON

## 2011-07-28 LAB — URINE CULTURE

## 2011-08-04 ENCOUNTER — Inpatient Hospital Stay (HOSPITAL_COMMUNITY)
Admission: EM | Admit: 2011-08-04 | Discharge: 2011-08-08 | DRG: 689 | Disposition: A | Discharge status: Home Health | Payer: Medicare Other | Attending: Internal Medicine | Admitting: Internal Medicine

## 2011-08-04 DIAGNOSIS — F411 Generalized anxiety disorder: Secondary | ICD-10-CM | POA: Diagnosis present

## 2011-08-04 DIAGNOSIS — F1920 Other psychoactive substance dependence, uncomplicated: Secondary | ICD-10-CM | POA: Diagnosis present

## 2011-08-04 DIAGNOSIS — N319 Neuromuscular dysfunction of bladder, unspecified: Secondary | ICD-10-CM | POA: Diagnosis present

## 2011-08-04 DIAGNOSIS — IMO0002 Reserved for concepts with insufficient information to code with codable children: Secondary | ICD-10-CM

## 2011-08-04 DIAGNOSIS — K219 Gastro-esophageal reflux disease without esophagitis: Secondary | ICD-10-CM | POA: Diagnosis present

## 2011-08-04 DIAGNOSIS — Q638 Other specified congenital malformations of kidney: Secondary | ICD-10-CM

## 2011-08-04 DIAGNOSIS — G43909 Migraine, unspecified, not intractable, without status migrainosus: Secondary | ICD-10-CM | POA: Diagnosis present

## 2011-08-04 DIAGNOSIS — R112 Nausea with vomiting, unspecified: Secondary | ICD-10-CM | POA: Diagnosis present

## 2011-08-04 DIAGNOSIS — G904 Autonomic dysreflexia: Secondary | ICD-10-CM | POA: Diagnosis present

## 2011-08-04 DIAGNOSIS — G8929 Other chronic pain: Secondary | ICD-10-CM | POA: Diagnosis present

## 2011-08-04 DIAGNOSIS — N946 Dysmenorrhea, unspecified: Secondary | ICD-10-CM | POA: Diagnosis not present

## 2011-08-04 DIAGNOSIS — Z8614 Personal history of Methicillin resistant Staphylococcus aureus infection: Secondary | ICD-10-CM

## 2011-08-04 DIAGNOSIS — G4700 Insomnia, unspecified: Secondary | ICD-10-CM | POA: Diagnosis present

## 2011-08-04 DIAGNOSIS — M4120 Other idiopathic scoliosis, site unspecified: Secondary | ICD-10-CM | POA: Diagnosis present

## 2011-08-04 DIAGNOSIS — L89309 Pressure ulcer of unspecified buttock, unspecified stage: Secondary | ICD-10-CM | POA: Diagnosis present

## 2011-08-04 DIAGNOSIS — Z87440 Personal history of urinary (tract) infections: Secondary | ICD-10-CM

## 2011-08-04 DIAGNOSIS — Z79899 Other long term (current) drug therapy: Secondary | ICD-10-CM

## 2011-08-04 DIAGNOSIS — N12 Tubulo-interstitial nephritis, not specified as acute or chronic: Principal | ICD-10-CM | POA: Diagnosis present

## 2011-08-04 DIAGNOSIS — Z87442 Personal history of urinary calculi: Secondary | ICD-10-CM

## 2011-08-04 DIAGNOSIS — G8220 Paraplegia, unspecified: Secondary | ICD-10-CM | POA: Diagnosis present

## 2011-08-04 DIAGNOSIS — L8993 Pressure ulcer of unspecified site, stage 3: Secondary | ICD-10-CM | POA: Diagnosis present

## 2011-08-04 DIAGNOSIS — K649 Unspecified hemorrhoids: Secondary | ICD-10-CM | POA: Diagnosis present

## 2011-08-04 LAB — POCT I-STAT, CHEM 8
BUN: 16 mg/dL (ref 6–23)
Calcium, Ion: 1.06 mmol/L — ABNORMAL LOW (ref 1.12–1.32)
Chloride: 102 mEq/L (ref 96–112)
Glucose, Bld: 82 mg/dL (ref 70–99)
HCT: 43 % (ref 36.0–46.0)
TCO2: 23 mmol/L (ref 0–100)

## 2011-08-04 LAB — CBC
MCH: 28 pg (ref 26.0–34.0)
MCV: 84.2 fL (ref 78.0–100.0)
Platelets: 224 10*3/uL (ref 150–400)
RDW: 14.5 % (ref 11.5–15.5)

## 2011-08-04 LAB — URINALYSIS, ROUTINE W REFLEX MICROSCOPIC
Glucose, UA: NEGATIVE mg/dL
Protein, ur: NEGATIVE mg/dL
Specific Gravity, Urine: 1.024 (ref 1.005–1.030)

## 2011-08-04 LAB — DIFFERENTIAL
Basophils Relative: 1 % (ref 0–1)
Eosinophils Absolute: 0.3 10*3/uL (ref 0.0–0.7)
Eosinophils Relative: 3 % (ref 0–5)
Lymphs Abs: 3.2 10*3/uL (ref 0.7–4.0)
Monocytes Relative: 7 % (ref 3–12)

## 2011-08-04 LAB — URINE MICROSCOPIC-ADD ON

## 2011-08-05 ENCOUNTER — Inpatient Hospital Stay (HOSPITAL_COMMUNITY): Payer: Medicare Other
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
## 2011-08-06 LAB — BASIC METABOLIC PANEL
Calcium: 9.1 mg/dL (ref 8.4–10.5)
Creatinine, Ser: 0.61 mg/dL (ref 0.50–1.10)
GFR calc Af Amer: >60 mL/min (ref >60)
Sodium: 138 mEq/L (ref 135–145)

## 2011-08-06 LAB — CBC
MCH: 27.3 pg (ref 26.0–34.0)
MCV: 85.4 fL (ref 78.0–100.0)
Platelets: 196 10*3/uL (ref 150–400)
RDW: 14.6 % (ref 11.5–15.5)
WBC: 5.6 10*3/uL (ref 4.0–10.5)

## 2011-08-07 LAB — CBC
Platelets: 192 10*3/uL (ref 150–400)
RBC: 4.4 MIL/uL (ref 3.87–5.11)
RDW: 14.2 % (ref 11.5–15.5)
WBC: 6.1 10*3/uL (ref 4.0–10.5)

## 2011-08-07 LAB — URINE CULTURE

## 2011-08-07 LAB — BASIC METABOLIC PANEL
Chloride: 103 mEq/L (ref 96–112)
GFR calc Af Amer: >60 mL/min (ref >60)
Potassium: 3.9 mEq/L (ref 3.5–5.1)
Sodium: 137 mEq/L (ref 135–145)

## 2011-08-08 LAB — BASIC METABOLIC PANEL
BUN: 6 mg/dL (ref 6–23)
CO2: 29 mEq/L (ref 19–32)
Chloride: 103 mEq/L (ref 96–112)
GFR calc Af Amer: >60 mL/min (ref >60)
Glucose, Bld: 85 mg/dL (ref 70–99)
Potassium: 4.2 mEq/L (ref 3.5–5.1)

## 2011-08-08 LAB — CBC
HCT: 36.3 % (ref 36.0–46.0)
Hemoglobin: 11.8 g/dL — ABNORMAL LOW (ref 12.0–15.0)
RBC: 4.28 MIL/uL (ref 3.87–5.11)
RDW: 14.1 % (ref 11.5–15.5)
WBC: 5.2 10*3/uL (ref 4.0–10.5)

## 2011-08-10 ENCOUNTER — Emergency Department (HOSPITAL_COMMUNITY)
Admission: EM | Admit: 2011-08-10 | Discharge: 2011-08-10 | Disposition: A | Discharge status: Home / Self Care | Payer: Medicare Other | Attending: Emergency Medicine | Admitting: Emergency Medicine

## 2011-08-10 ENCOUNTER — Emergency Department (HOSPITAL_COMMUNITY): Payer: Medicare Other

## 2011-08-10 DIAGNOSIS — R0602 Shortness of breath: Secondary | ICD-10-CM | POA: Insufficient documentation

## 2011-08-10 DIAGNOSIS — R079 Chest pain, unspecified: Secondary | ICD-10-CM | POA: Insufficient documentation

## 2011-08-10 DIAGNOSIS — K219 Gastro-esophageal reflux disease without esophagitis: Secondary | ICD-10-CM | POA: Insufficient documentation

## 2011-08-10 DIAGNOSIS — G8250 Quadriplegia, unspecified: Secondary | ICD-10-CM | POA: Insufficient documentation

## 2011-08-10 LAB — COMPREHENSIVE METABOLIC PANEL
Alkaline Phosphatase: 113 U/L (ref 39–117)
BUN: 9 mg/dL (ref 6–23)
CO2: 23 mEq/L (ref 19–32)
Chloride: 100 mEq/L (ref 96–112)
GFR calc Af Amer: >60 mL/min (ref >60)
Glucose, Bld: 86 mg/dL (ref 70–99)
Potassium: 3.8 mEq/L (ref 3.5–5.1)
Total Bilirubin: 0.3 mg/dL (ref 0.3–1.2)

## 2011-08-10 LAB — CBC
HCT: 35.7 % — ABNORMAL LOW (ref 36.0–46.0)
MCV: 84.4 fL (ref 78.0–100.0)
RBC: 4.23 MIL/uL (ref 3.87–5.11)
WBC: 8.2 10*3/uL (ref 4.0–10.5)

## 2011-08-10 LAB — POCT I-STAT, CHEM 8
BUN: 9 mg/dL (ref 6–23)
Calcium, Ion: 1.13 mmol/L (ref 1.12–1.32)
Chloride: 103 mEq/L (ref 96–112)

## 2011-08-10 LAB — DIFFERENTIAL
Basophils Relative: 1 % (ref 0–1)
Eosinophils Relative: 4 % (ref 0–5)
Monocytes Absolute: 0.6 10*3/uL (ref 0.1–1.0)
Neutrophils Relative %: 62 % (ref 43–77)

## 2011-08-11 ENCOUNTER — Other Ambulatory Visit (HOSPITAL_COMMUNITY): Payer: Self-pay | Admitting: Geriatric Medicine

## 2011-08-11 DIAGNOSIS — B999 Unspecified infectious disease: Secondary | ICD-10-CM

## 2011-08-12 ENCOUNTER — Ambulatory Visit (HOSPITAL_COMMUNITY)
Admission: RE | Admit: 2011-08-12 | Discharge: 2011-08-12 | Payer: Medicare Other | Source: Ambulatory Visit | Attending: Geriatric Medicine | Admitting: Geriatric Medicine
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
## 2011-08-18 ENCOUNTER — Emergency Department (HOSPITAL_COMMUNITY)
Admission: EM | Admit: 2011-08-18 | Discharge: 2011-08-18 | Disposition: A | Discharge status: Home / Self Care | Payer: Medicare Other | Attending: Emergency Medicine | Admitting: Emergency Medicine

## 2011-08-18 ENCOUNTER — Emergency Department (HOSPITAL_COMMUNITY): Payer: Medicare Other

## 2011-08-18 DIAGNOSIS — R5381 Other malaise: Secondary | ICD-10-CM | POA: Insufficient documentation

## 2011-08-18 DIAGNOSIS — G8929 Other chronic pain: Secondary | ICD-10-CM | POA: Insufficient documentation

## 2011-08-18 DIAGNOSIS — R110 Nausea: Secondary | ICD-10-CM | POA: Insufficient documentation

## 2011-08-18 DIAGNOSIS — N390 Urinary tract infection, site not specified: Secondary | ICD-10-CM | POA: Insufficient documentation

## 2011-08-18 DIAGNOSIS — G8250 Quadriplegia, unspecified: Secondary | ICD-10-CM | POA: Insufficient documentation

## 2011-08-18 DIAGNOSIS — R5383 Other fatigue: Secondary | ICD-10-CM | POA: Insufficient documentation

## 2011-08-18 DIAGNOSIS — R29898 Other symptoms and signs involving the musculoskeletal system: Secondary | ICD-10-CM | POA: Insufficient documentation

## 2011-08-18 DIAGNOSIS — Z79899 Other long term (current) drug therapy: Secondary | ICD-10-CM | POA: Insufficient documentation

## 2011-08-18 DIAGNOSIS — R109 Unspecified abdominal pain: Secondary | ICD-10-CM | POA: Insufficient documentation

## 2011-08-18 LAB — URINALYSIS, ROUTINE W REFLEX MICROSCOPIC
Glucose, UA: NEGATIVE mg/dL
Specific Gravity, Urine: 1.014 (ref 1.005–1.030)
Urobilinogen, UA: 1 mg/dL (ref 0.0–1.0)

## 2011-08-18 LAB — COMPREHENSIVE METABOLIC PANEL
ALT: 8 U/L (ref 0–35)
AST: 17 U/L (ref 0–37)
Albumin: 3.1 g/dL — ABNORMAL LOW (ref 3.5–5.2)
CO2: 23 mEq/L (ref 19–32)
Calcium: 9.1 mg/dL (ref 8.4–10.5)
Chloride: 103 mEq/L (ref 96–112)
GFR calc non Af Amer: >60 mL/min (ref >60)
Sodium: 134 mEq/L — ABNORMAL LOW (ref 135–145)

## 2011-08-18 LAB — DIFFERENTIAL
Basophils Relative: 1 % (ref 0–1)
Eosinophils Absolute: 0.3 10*3/uL (ref 0.0–0.7)
Neutrophils Relative %: 52 % (ref 43–77)

## 2011-08-18 LAB — URINE MICROSCOPIC-ADD ON

## 2011-08-18 LAB — CBC
MCH: 28.5 pg (ref 26.0–34.0)
Platelets: 259 10*3/uL (ref 150–400)
RBC: 4.25 MIL/uL (ref 3.87–5.11)
RDW: 14.6 % (ref 11.5–15.5)
WBC: 7.9 10*3/uL (ref 4.0–10.5)

## 2011-08-19 LAB — URINE CULTURE

## 2011-09-01 ENCOUNTER — Emergency Department (HOSPITAL_COMMUNITY): Payer: Medicare Other

## 2011-09-01 ENCOUNTER — Emergency Department (HOSPITAL_COMMUNITY)
Admission: EM | Admit: 2011-09-01 | Discharge: 2011-09-02 | Disposition: A | Discharge status: Home / Self Care | Payer: Medicare Other | Attending: Emergency Medicine | Admitting: Emergency Medicine

## 2011-09-01 DIAGNOSIS — K5900 Constipation, unspecified: Secondary | ICD-10-CM | POA: Insufficient documentation

## 2011-09-01 DIAGNOSIS — G8220 Paraplegia, unspecified: Secondary | ICD-10-CM | POA: Insufficient documentation

## 2011-09-01 DIAGNOSIS — M4120 Other idiopathic scoliosis, site unspecified: Secondary | ICD-10-CM | POA: Insufficient documentation

## 2011-09-01 DIAGNOSIS — R1033 Periumbilical pain: Secondary | ICD-10-CM | POA: Insufficient documentation

## 2011-09-01 DIAGNOSIS — N390 Urinary tract infection, site not specified: Secondary | ICD-10-CM | POA: Insufficient documentation

## 2011-09-01 LAB — DIFFERENTIAL
Lymphocytes Relative: 34 % (ref 12–46)
Lymphs Abs: 3 10*3/uL (ref 0.7–4.0)
Monocytes Absolute: 0.7 10*3/uL (ref 0.1–1.0)
Monocytes Relative: 8 % (ref 3–12)
Neutro Abs: 4.7 10*3/uL (ref 1.7–7.7)
Neutrophils Relative %: 54 % (ref 43–77)

## 2011-09-01 LAB — CBC
HCT: 35 % — ABNORMAL LOW (ref 36.0–46.0)
Hemoglobin: 11.7 g/dL — ABNORMAL LOW (ref 12.0–15.0)
MCH: 28.4 pg (ref 26.0–34.0)
MCHC: 33.4 g/dL (ref 30.0–36.0)
RBC: 4.12 MIL/uL (ref 3.87–5.11)

## 2011-09-02 LAB — URINALYSIS, ROUTINE W REFLEX MICROSCOPIC
Nitrite: NEGATIVE
Protein, ur: NEGATIVE mg/dL
Specific Gravity, Urine: 1.023 (ref 1.005–1.030)
Urobilinogen, UA: 1 mg/dL (ref 0.0–1.0)

## 2011-09-02 LAB — BASIC METABOLIC PANEL
BUN: 14 mg/dL (ref 6–23)
CO2: 27 mEq/L (ref 19–32)
Calcium: 9 mg/dL (ref 8.4–10.5)
GFR calc non Af Amer: >60 mL/min (ref >60)
Glucose, Bld: 109 mg/dL — ABNORMAL HIGH (ref 70–99)
Sodium: 135 mEq/L (ref 135–145)

## 2011-09-02 LAB — URINE MICROSCOPIC-ADD ON

## 2011-09-02 LAB — PREGNANCY, URINE: Preg Test, Ur: NEGATIVE

## 2011-09-04 LAB — URINE CULTURE
Colony Count: >=100000
Culture  Setup Time: 201209110437

## 2011-10-14 ENCOUNTER — Emergency Department (HOSPITAL_COMMUNITY)
Admission: EM | Admit: 2011-10-14 | Discharge: 2011-10-14 | Disposition: A | Discharge status: Home / Self Care | Payer: Medicare Other | Attending: Emergency Medicine | Admitting: Emergency Medicine

## 2011-10-14 DIAGNOSIS — G8220 Paraplegia, unspecified: Secondary | ICD-10-CM | POA: Insufficient documentation

## 2011-10-14 DIAGNOSIS — R109 Unspecified abdominal pain: Secondary | ICD-10-CM | POA: Insufficient documentation

## 2011-10-14 DIAGNOSIS — N390 Urinary tract infection, site not specified: Secondary | ICD-10-CM | POA: Insufficient documentation

## 2011-10-14 DIAGNOSIS — N739 Female pelvic inflammatory disease, unspecified: Secondary | ICD-10-CM | POA: Insufficient documentation

## 2011-10-14 DIAGNOSIS — M545 Low back pain, unspecified: Secondary | ICD-10-CM | POA: Insufficient documentation

## 2011-10-14 DIAGNOSIS — Z79899 Other long term (current) drug therapy: Secondary | ICD-10-CM | POA: Insufficient documentation

## 2011-10-14 DIAGNOSIS — M4120 Other idiopathic scoliosis, site unspecified: Secondary | ICD-10-CM | POA: Insufficient documentation

## 2011-10-14 LAB — POCT PREGNANCY, URINE: Preg Test, Ur: NEGATIVE

## 2011-10-14 LAB — URINALYSIS, ROUTINE W REFLEX MICROSCOPIC
Bilirubin Urine: NEGATIVE
Glucose, UA: NEGATIVE mg/dL
Ketones, ur: NEGATIVE mg/dL
Nitrite: POSITIVE — AB
Protein, ur: 30 mg/dL — AB

## 2011-10-14 LAB — URINE MICROSCOPIC-ADD ON

## 2011-10-14 LAB — WET PREP, GENITAL
Clue Cells Wet Prep HPF POC: NONE SEEN
Trich, Wet Prep: NONE SEEN

## 2011-10-15 LAB — GC/CHLAMYDIA PROBE AMP, GENITAL: Chlamydia, DNA Probe: NEGATIVE

## 2011-10-23 DIAGNOSIS — IMO0001 Reserved for inherently not codable concepts without codable children: Secondary | ICD-10-CM

## 2011-10-23 HISTORY — DX: Reserved for inherently not codable concepts without codable children: IMO0001

## 2011-11-27 DIAGNOSIS — Z87440 Personal history of urinary (tract) infections: Secondary | ICD-10-CM

## 2011-11-27 HISTORY — DX: Personal history of urinary (tract) infections: Z87.440

## 2012-01-30 ENCOUNTER — Encounter (HOSPITAL_COMMUNITY): Payer: Self-pay

## 2012-01-30 ENCOUNTER — Ambulatory Visit (HOSPITAL_COMMUNITY)
Admission: RE | Admit: 2012-01-30 | Discharge: 2012-01-30 | Disposition: A | Discharge status: Home / Self Care | Payer: Medicare Other | Source: Ambulatory Visit | Attending: Obstetrics and Gynecology | Admitting: Obstetrics and Gynecology

## 2012-01-30 ENCOUNTER — Encounter: Payer: Self-pay | Admitting: Obstetrics and Gynecology

## 2012-01-30 DIAGNOSIS — Z1231 Encounter for screening mammogram for malignant neoplasm of breast: Secondary | ICD-10-CM | POA: Insufficient documentation

## 2012-01-30 HISTORY — DX: Sciatica, unspecified side: M54.30

## 2012-01-30 HISTORY — DX: Unspecified injury at unspecified level of cervical spinal cord, initial encounter: S14.109A

## 2012-01-30 HISTORY — DX: Urinary tract infection, site not specified: N39.0
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
## 2012-01-30 NOTE — Progress Notes (Signed)
Encounter addended by: Rema Fendt, MD on: 01/30/2012  3:44 PM<BR>     Documentation filed: Notes Section

## 2012-02-03 ENCOUNTER — Telehealth (HOSPITAL_COMMUNITY): Payer: Self-pay

## 2012-02-03 NOTE — Telephone Encounter (Signed)
Pt made aware of quantitative HcG results from 2.8.13.   (not pregnant)

## 2012-03-10 ENCOUNTER — Encounter (HOSPITAL_COMMUNITY): Payer: Self-pay | Admitting: Emergency Medicine

## 2012-03-10 ENCOUNTER — Emergency Department (HOSPITAL_COMMUNITY)
Admission: EM | Admit: 2012-03-10 | Discharge: 2012-03-10 | Disposition: A | Discharge status: Home Health | Payer: Medicare Other | Attending: Emergency Medicine | Admitting: Emergency Medicine

## 2012-03-10 ENCOUNTER — Emergency Department (HOSPITAL_COMMUNITY): Payer: Medicare Other

## 2012-03-10 DIAGNOSIS — G8929 Other chronic pain: Secondary | ICD-10-CM | POA: Insufficient documentation

## 2012-03-10 DIAGNOSIS — J45909 Unspecified asthma, uncomplicated: Secondary | ICD-10-CM | POA: Insufficient documentation

## 2012-03-10 DIAGNOSIS — R109 Unspecified abdominal pain: Secondary | ICD-10-CM | POA: Insufficient documentation

## 2012-03-10 DIAGNOSIS — R61 Generalized hyperhidrosis: Secondary | ICD-10-CM | POA: Insufficient documentation

## 2012-03-10 DIAGNOSIS — M549 Dorsalgia, unspecified: Secondary | ICD-10-CM | POA: Insufficient documentation

## 2012-03-10 DIAGNOSIS — R509 Fever, unspecified: Secondary | ICD-10-CM | POA: Insufficient documentation

## 2012-03-10 DIAGNOSIS — R110 Nausea: Secondary | ICD-10-CM | POA: Insufficient documentation

## 2012-03-10 DIAGNOSIS — N898 Other specified noninflammatory disorders of vagina: Secondary | ICD-10-CM | POA: Insufficient documentation

## 2012-03-10 DIAGNOSIS — Z79899 Other long term (current) drug therapy: Secondary | ICD-10-CM | POA: Insufficient documentation

## 2012-03-10 DIAGNOSIS — N390 Urinary tract infection, site not specified: Secondary | ICD-10-CM | POA: Insufficient documentation

## 2012-03-10 LAB — COMPREHENSIVE METABOLIC PANEL
ALT: 14 U/L (ref 0–35)
AST: 24 U/L (ref 0–37)
Albumin: 3.8 g/dL (ref 3.5–5.2)
Alkaline Phosphatase: 110 U/L (ref 39–117)
BUN: 14 mg/dL (ref 6–23)
CO2: 22 mEq/L (ref 19–32)
Calcium: 8.7 mg/dL (ref 8.4–10.5)
Chloride: 100 mEq/L (ref 96–112)
Creatinine, Ser: 0.6 mg/dL (ref 0.50–1.10)
GFR calc Af Amer: >90 mL/min (ref >90)
GFR calc non Af Amer: >90 mL/min (ref >90)
Glucose, Bld: 120 mg/dL — ABNORMAL HIGH (ref 70–99)
Potassium: 4.5 mEq/L (ref 3.5–5.1)
Sodium: 136 mEq/L (ref 135–145)
Total Bilirubin: 0.6 mg/dL (ref 0.3–1.2)
Total Protein: 8.1 g/dL (ref 6.0–8.3)

## 2012-03-10 LAB — DIFFERENTIAL
Basophils Absolute: 0 10*3/uL (ref 0.0–0.1)
Basophils Relative: 0 % (ref 0–1)
Lymphocytes Relative: 6 % — ABNORMAL LOW (ref 12–46)
Monocytes Absolute: 0.5 10*3/uL (ref 0.1–1.0)
Monocytes Relative: 3 % (ref 3–12)
Neutro Abs: 13.1 10*3/uL — ABNORMAL HIGH (ref 1.7–7.7)
Neutrophils Relative %: 91 % — ABNORMAL HIGH (ref 43–77)

## 2012-03-10 LAB — CBC
HCT: 40.5 % (ref 36.0–46.0)
Hemoglobin: 13.8 g/dL (ref 12.0–15.0)
MCH: 29.7 pg (ref 26.0–34.0)
MCHC: 34.1 g/dL (ref 30.0–36.0)
MCV: 87.3 fL (ref 78.0–100.0)
Platelets: 179 K/uL (ref 150–400)
RBC: 4.64 MIL/uL (ref 3.87–5.11)
RDW: 13.2 % (ref 11.5–15.5)
WBC: 14.5 K/uL — ABNORMAL HIGH (ref 4.0–10.5)

## 2012-03-10 LAB — URINE MICROSCOPIC-ADD ON

## 2012-03-10 LAB — POCT PREGNANCY, URINE: Preg Test, Ur: NEGATIVE

## 2012-03-10 LAB — URINALYSIS, ROUTINE W REFLEX MICROSCOPIC
Glucose, UA: NEGATIVE mg/dL
Hgb urine dipstick: NEGATIVE
Ketones, ur: NEGATIVE mg/dL
Nitrite: POSITIVE — AB
Protein, ur: NEGATIVE mg/dL
Specific Gravity, Urine: 1.026 (ref 1.005–1.030)
Urobilinogen, UA: 2 mg/dL — ABNORMAL HIGH (ref 0.0–1.0)
pH: 7.5 (ref 5.0–8.0)

## 2012-03-10 MED ORDER — CIPROFLOXACIN HCL 500 MG PO TABS: 500.0000 mg | ORAL_TABLET | Freq: Two times a day (BID) | ORAL | Status: AC

## 2012-03-10 MED ORDER — CIPROFLOXACIN IN D5W 400 MG/200ML IV SOLN
400.0000 mg | Freq: Once | INTRAVENOUS | Status: AC
  Administered 2012-03-10: 400 mg via INTRAVENOUS
  Filled 2012-03-10: qty 200

## 2012-03-10 MED ORDER — ONDANSETRON HCL 4 MG/2ML IJ SOLN
4.0000 mg | Freq: Once | INTRAMUSCULAR | Status: AC
  Administered 2012-03-10: 4 mg via INTRAVENOUS
  Filled 2012-03-10: qty 2

## 2012-03-10 MED ORDER — HYDROMORPHONE HCL PF 1 MG/ML IJ SOLN
1.0000 mg | Freq: Once | INTRAMUSCULAR | Status: AC
  Administered 2012-03-10: 1 mg via INTRAMUSCULAR
  Filled 2012-03-10: qty 1

## 2012-03-10 MED ORDER — MORPHINE SULFATE 4 MG/ML IJ SOLN
4.0000 mg | Freq: Once | INTRAMUSCULAR | Status: AC
  Administered 2012-03-10: 4 mg via INTRAVENOUS
  Filled 2012-03-10: qty 1

## 2012-03-10 MED ORDER — IOHEXOL 300 MG/ML  SOLN: 100.0000 mL | Freq: Once | INTRAMUSCULAR | Status: DC | PRN

## 2012-03-10 MED ORDER — ACETAMINOPHEN 500 MG PO TABS
1000.0000 mg | ORAL_TABLET | Freq: Once | ORAL | Status: AC
  Administered 2012-03-10: 1000 mg via ORAL
  Filled 2012-03-10: qty 2

## 2012-03-10 NOTE — ED Provider Notes (Signed)
History     CSN: [id number]  Arrival date & time 03/10/12  1303   First MD Initiated Contact with Patient 03/10/12 1322      No chief complaint on file.   (Consider location/radiation/quality/duration/timing/severity/associated sxs/prior treatment) HPI [name] is a 26 yo female with a  History of cervical region spinal cord injury and scoliosis who presents to the ED today after waking up this morning with chills and a temperature of 102 F. States that she has had some abdominal pain, but it has been progressively worsening.  She has had UTIs in the past, but since being placed on Nitrofurantoin she states this has decreased.  She is also worried about being pregnant as she has had some spotting, but no true menstrual period since January.  She is not actively taking her birth control and is sexually active.  Has been dry heaving this morning with some nausea, but denies any diarrhea.  No changes in her LOC or mentation.  No changes in weight, night sweats, chest pain or SOB.  She has chronic back pain from her limited mobility and scoliosis, but feels this has worsened over the past 24 hours.   Past Medical History  Diagnosis Date  . Spinal cord injury, cervical region C4-C5  . Asthma Mild, only with exposure to cigarette smoke  . Sciatica   . Recurrent UTI requires self catheterization    Past Surgical History  Procedure Date  . Vascular surgery IVC filter placed in 2005  . Back surgery Scoliosis, Harrington Rods in place  . Orthopedic surgery Multiple lower extremity surgeries as a child    No family history on file.  History  Substance Use Topics  . Smoking status: Unknown If Ever Smoked  . Smokeless tobacco: Not on file  . Alcohol Use: No    OB History    Grav Para Term Preterm Abortions TAB SAB Ect Mult Living   0               Review of Systems All pertinent positives and negatives reviewed in the history of present illness  Allergies  Bactrim; Benadryl; and  Reglan  Home Medications   Current Outpatient Rx  Name Route Sig Dispense Refill  . DIAZEPAM 10 MG PO TABS Oral Take 15 mg by mouth 2 (two) times daily. Pt takes 1 and 1/2 tab twice daily    . HYDROMORPHONE HCL 4 MG PO TABS Oral Take 4 mg by mouth 2 (two) times daily as needed. pain    . MORPHINE SULFATE ER 30 MG PO TB12 Oral Take 30 mg by mouth 2 (two) times daily.    Marland Kitchen NITROFURANTOIN MACROCRYSTAL 100 MG PO CAPS Oral Take 100 mg by mouth daily.     Marland Kitchen PANTOPRAZOLE SODIUM 40 MG PO TBEC Oral Take 40 mg by mouth daily.    Marland Kitchen PROMETHAZINE HCL 25 MG PO TABS Oral Take 25 mg by mouth every 6 (six) hours as needed.    Marland Kitchen SOLIFENACIN SUCCINATE 10 MG PO TABS Oral Take 10 mg by mouth daily.     Marland Kitchen ZOLPIDEM TARTRATE 10 MG PO TABS Oral Take 10 mg by mouth at bedtime as needed.      BP 102/65  Pulse 121  Temp(Src) 102.2 F (39 C) (Oral)  SpO2 95%  Physical Exam  Constitutional: She is oriented to person, place, and time. She appears well-developed and well-nourished. No distress.  Cardiovascular: Normal rate, regular rhythm, normal heart sounds and intact distal pulses.  No murmur heard. Pulmonary/Chest: Effort normal and breath sounds normal. No respiratory distress. She has no wheezes.  Abdominal: Soft. Bowel sounds are normal. She exhibits no distension. There is tenderness (diffuse).  Neurological: She is alert and oriented to person, place, and time.  Skin: Skin is warm. No rash noted. She is diaphoretic. No erythema.    ED Course  Procedures (including critical care time)   Labs Reviewed  URINALYSIS, ROUTINE W REFLEX MICROSCOPIC  COMPREHENSIVE METABOLIC PANEL  CBC  DIFFERENTIAL   No results found.  Pt seen and assessed.  Will order labs, IV fluids, and fever-reducer. 4:04 PM The patient rechecked and seems to be less diaphoretic. She is feeling less warm. Nurse failed at IV access and lab states blood was clotted. 8:26 PM Patient is vastly improved and she states she would like  to go home because she has an appointment tomorrow with her urologist.  She feels better at this time.  Patient was given ciprofloxacin for 7 days. MDM   MDM Reviewed: vitals, nursing note and previous chart Reviewed previous: labs Interpretation: labs and CT scan          Carlyle Dolly, PA-C 03/10/12 2027

## 2012-03-10 NOTE — ED Notes (Signed)
Pt taken out of ED by [facility] prior to signing discharge documentation.

## 2012-03-10 NOTE — Discharge Instructions (Signed)
Return here for any worsening in her condition.  Increase your fluid intake.  Followup with your urologist and primary care doctor for a recheck.  Her CT scan did not show any abnormalities other than constipation.

## 2012-03-10 NOTE — ED Notes (Signed)
[facility] contacted for pt transport home.

## 2012-03-10 NOTE — ED Notes (Signed)
Per EMS pt c/o of fever and chills today and abdominal pain x 4 quadrants. Dry heaving. Pain 9/10 to abdomen. Back pain is 10/10. Pt reports that she is trying to get pregnant. No vomiting. Fever is 102 at home.

## 2012-03-26 NOTE — ED Provider Notes (Signed)
Medical screening examination/treatment/procedure(s) were performed by non-physician practitioner and as supervising physician I was immediately available for consultation/collaboration.  Raeford Razor, MD 03/26/12 [phone]

## 2012-04-06 ENCOUNTER — Emergency Department (HOSPITAL_COMMUNITY): Payer: Medicare Other

## 2012-04-06 ENCOUNTER — Emergency Department (HOSPITAL_COMMUNITY)
Admission: EM | Admit: 2012-04-06 | Discharge: 2012-04-06 | Disposition: A | Discharge status: Home / Self Care | Payer: Medicare Other | Attending: Emergency Medicine | Admitting: Emergency Medicine

## 2012-04-06 DIAGNOSIS — Z79899 Other long term (current) drug therapy: Secondary | ICD-10-CM | POA: Insufficient documentation

## 2012-04-06 DIAGNOSIS — J45909 Unspecified asthma, uncomplicated: Secondary | ICD-10-CM | POA: Insufficient documentation

## 2012-04-06 DIAGNOSIS — S139XXA Sprain of joints and ligaments of unspecified parts of neck, initial encounter: Secondary | ICD-10-CM | POA: Insufficient documentation

## 2012-04-06 DIAGNOSIS — M542 Cervicalgia: Secondary | ICD-10-CM | POA: Insufficient documentation

## 2012-04-06 DIAGNOSIS — G8220 Paraplegia, unspecified: Secondary | ICD-10-CM | POA: Insufficient documentation

## 2012-04-06 DIAGNOSIS — X500XXA Overexertion from strenuous movement or load, initial encounter: Secondary | ICD-10-CM | POA: Insufficient documentation

## 2012-04-06 DIAGNOSIS — S161XXA Strain of muscle, fascia and tendon at neck level, initial encounter: Secondary | ICD-10-CM

## 2012-04-06 MED ORDER — HYDROMORPHONE HCL PF 2 MG/ML IJ SOLN
2.0000 mg | Freq: Once | INTRAMUSCULAR | Status: AC
  Administered 2012-04-06: 2 mg via INTRAMUSCULAR
  Filled 2012-04-06: qty 1

## 2012-04-06 NOTE — Discharge Instructions (Signed)
Keep your scheduled appointment with your neurosurgeon in [place] next month. Take yourDilaudid as needed for pain. Take the disc given to you today to your appointment with your neurosurgeon

## 2012-04-06 NOTE — ED Notes (Signed)
Pt c/o bilateral neck pain radiating into bilateral shoulders. Pt reports moving from her wheelchair to the bed Saturday and felt something pop in her neck. Pt describes her pain as something moving in her neck

## 2012-04-06 NOTE — ED Provider Notes (Addendum)
History     CSN: [id number]  Arrival date & time 04/06/12  1526   First MD Initiated Contact with Patient 04/06/12 1612      Chief Complaint  Patient presents with  . Neck Pain    (Consider location/radiation/quality/duration/timing/severity/associated sxs/prior treatment) HPI Complains of neck pain onset 3 days ago after her husband went to lie her down and she felt a pull in her neck. Pain is worse with movement. Treated with oral dilaudid with partial relief no other associated symptoms pain is nonradiating sharp moderate to severe Past Medical History  Diagnosis Date  . Spinal cord injury, cervical region C4-C5  . Asthma Mild, only with exposure to cigarette smoke  . Sciatica   . Recurrent UTI requires self catheterization    Past Surgical History  Procedure Date  . Vascular surgery IVC filter placed in 2005  . Back surgery Scoliosis, Harrington Rods in place  . Orthopedic surgery Multiple lower extremity surgeries as a child    No family history on file.  History  Substance Use Topics  . Smoking status: Unknown If Ever Smoked  . Smokeless tobacco: Not on file  . Alcohol Use: No    OB History    Grav Para Term Preterm Abortions TAB SAB Ect Mult Living   0               Review of Systems  Constitutional: Negative.   HENT: Negative.   Respiratory: Negative.   Cardiovascular: Negative.   Gastrointestinal: Negative.   Musculoskeletal:       Neck pain  Skin: Positive for wound.       Chronic wounds on buttocks  Neurological: Negative.        Paraplegic  Hematological: Negative.   Psychiatric/Behavioral: Negative.   All other systems reviewed and are negative.    Allergies  Bactrim; Reglan; and Benadryl  Home Medications   Current Outpatient Rx  Name Route Sig Dispense Refill  . ALBUTEROL SULFATE HFA 108 (90 BASE) MCG/ACT IN AERS Inhalation Inhale 2 puffs into the lungs every 6 (six) hours as needed. For wheezing    . AZO-CRANBERRY PO Oral Take 2  tablets by mouth every morning.    Marland Kitchen DIAZEPAM 10 MG PO TABS Oral Take 15 mg by mouth 2 (two) times daily. Pt takes 1 and 1/2 tab twice daily    . HYDROMORPHONE HCL 4 MG PO TABS Oral Take 4 mg by mouth 2 (two) times daily as needed. pain    . NITROFURANTOIN MACROCRYSTAL 100 MG PO CAPS Oral Take 100 mg by mouth at bedtime.     Marland Kitchen OVER THE COUNTER MEDICATION Oral Take 1 tablet by mouth every morning. Medication:AZO-YEAST    . PANTOPRAZOLE SODIUM 40 MG PO TBEC Oral Take 40 mg by mouth every morning.     . AZO-STANDARD PO Oral Take 1 tablet by mouth daily as needed. For UTI    . PRENATAL PO Oral Take 1 tablet by mouth every morning.    Marland Kitchen PROMETHAZINE HCL 25 MG PO TABS Oral Take 25 mg by mouth every 6 (six) hours as needed. For nausea    . SOLIFENACIN SUCCINATE 10 MG PO TABS Oral Take 10 mg by mouth every morning.     Marland Kitchen ZOLPIDEM TARTRATE 10 MG PO TABS Oral Take 10 mg by mouth at bedtime.       BP 93/61  Pulse 81  Temp(Src) 96.4 F (35.8 C) (Oral)  Resp 16  SpO2 97%  LMP 02/25/2012  Physical Exam  Nursing note and vitals reviewed. Constitutional: She is oriented to person, place, and time. She appears well-developed and well-nourished. No distress.  HENT:  Head: Normocephalic and atraumatic.  Eyes: Conjunctivae are normal. Pupils are equal, round, and reactive to light.  Neck: Neck supple. No tracheal deviation present. No thyromegaly present.  Cardiovascular: Normal rate.   No murmur heard. Pulmonary/Chest: Effort normal.  Abdominal: Soft. She exhibits no distension.  Musculoskeletal: She exhibits tenderness. She exhibits no edema.       Cervical spine tender  Neurological: She is alert and oriented to person, place, and time. Coordination normal.       Paraplegic motor strength of bilateral upper extremities 5 over 5  Skin: Skin is warm and dry. No rash noted.  Psychiatric: She has a normal mood and affect.    ED Course  Procedures (including critical care time)  Labs Reviewed -  No data to display No results found.   No diagnosis found.  Results for orders placed during the hospital encounter of 03/10/12  URINALYSIS, ROUTINE W REFLEX MICROSCOPIC      Component Value Range   Color, Urine ORANGE (*) YELLOW    APPearance CLEAR  CLEAR    Specific Gravity, Urine 1.026  1.005 - 1.030    pH 7.5  5.0 - 8.0    Glucose, UA NEGATIVE  NEGATIVE (mg/dL)   Hgb urine dipstick NEGATIVE  NEGATIVE    Bilirubin Urine SMALL (*) NEGATIVE    Ketones, ur NEGATIVE  NEGATIVE (mg/dL)   Protein, ur NEGATIVE  NEGATIVE (mg/dL)   Urobilinogen, UA 2.0 (*) 0.0 - 1.0 (mg/dL)   Nitrite POSITIVE (*) NEGATIVE    Leukocytes, UA SMALL (*) NEGATIVE   COMPREHENSIVE METABOLIC PANEL      Component Value Range   Sodium 136  135 - 145 (mEq/L)   Potassium 4.5  3.5 - 5.1 (mEq/L)   Chloride 100  96 - 112 (mEq/L)   CO2 22  19 - 32 (mEq/L)   Glucose, Bld 120 (*) 70 - 99 (mg/dL)   BUN 14  6 - 23 (mg/dL)   Creatinine, Ser 1.47  0.50 - 1.10 (mg/dL)   Calcium 8.7  8.4 - 82.9 (mg/dL)   Total Protein 8.1  6.0 - 8.3 (g/dL)   Albumin 3.8  3.5 - 5.2 (g/dL)   AST 24  0 - 37 (U/L)   ALT 14  0 - 35 (U/L)   Alkaline Phosphatase 110  39 - 117 (U/L)   Total Bilirubin 0.6  0.3 - 1.2 (mg/dL)   GFR calc non Af Amer >90  >90 (mL/min)   GFR calc Af Amer >90  >90 (mL/min)  POCT PREGNANCY, URINE      Component Value Range   Preg Test, Ur NEGATIVE  NEGATIVE   CBC      Component Value Range   WBC 14.5 (*) 4.0 - 10.5 (K/uL)   RBC 4.64  3.87 - 5.11 (MIL/uL)   Hemoglobin 13.8  12.0 - 15.0 (g/dL)   HCT 56.2  13.0 - 86.5 (%)   MCV 87.3  78.0 - 100.0 (fL)   MCH 29.7  26.0 - 34.0 (pg)   MCHC 34.1  30.0 - 36.0 (g/dL)   RDW 78.4  69.6 - 29.5 (%)   Platelets 179  150 - 400 (K/uL)  DIFFERENTIAL      Component Value Range   Neutrophils Relative 91 (*) 43 - 77 (%)   Neutro Abs 13.1 (*) 1.7 - 7.7 (  K/uL)   Lymphocytes Relative 6 (*) 12 - 46 (%)   Lymphs Abs 0.8  0.7 - 4.0 (K/uL)   Monocytes Relative 3  3 - 12 (%)    Monocytes Absolute 0.5  0.1 - 1.0 (K/uL)   Eosinophils Relative 0  0 - 5 (%)   Eosinophils Absolute 0.0  0.0 - 0.7 (K/uL)   Basophils Relative 0  0 - 1 (%)   Basophils Absolute 0.0  0.0 - 0.1 (K/uL)  URINE MICROSCOPIC-ADD ON      Component Value Range   Squamous Epithelial / LPF FEW (*) RARE    WBC, UA 3-6  <3 (WBC/hpf)   Bacteria, UA FEW (*) RARE    Urine-Other MUCOUS PRESENT     Ct Cervical Spine Wo Contrast  04/06/2012  *RADIOLOGY REPORT*  Clinical Data: Bilateral neck pain radiating to shoulders  CT CERVICAL SPINE WITHOUT CONTRAST  Technique:  Multidetector CT imaging of the cervical spine was performed. Multiplanar CT image reconstructions were also generated.  Comparison: None.  Findings: Levocurvature of the cervical spine.  No evidence of fracture or dislocation.  Vertebral body heights are maintained.  The dens appears intact.  No prevertebral soft tissue swelling.  Anterior cervical fixation hardware at C4-5.  Posterior pedicle screws at C4-5.  Posterior Harrington rods starting at T1, incompletely visualized.  Mild degenerative changes at C3-4.  Osseous fusion at C5-6 and C7- T2.  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  Visualized thyroid is unremarkable.  Visualized lung apices are clear.  IMPRESSION: No fracture or dislocation is seen.  Extensive postsurgical changes and congenital anomalies, as described above.  Original Report Authenticated By: Charline Bills, M.D.   Ct Abdomen Pelvis W Contrast  03/10/2012  *RADIOLOGY REPORT*  Clinical Data: Fever and chills.  Abdominal pain.  Vomiting.  CT ABDOMEN AND PELVIS WITH CONTRAST  Technique:  Multidetector CT imaging of the abdomen and pelvis was performed following the standard protocol during bolus administration of intravenous contrast.  Contrast:  100 ml Omnipaque-300  Comparison: 08/18/2011  Findings: There is chronic scarring at the lung bases.  No evidence of active pneumonia or collapse.  The liver has a normal  appearance without focal lesions or biliary ductal dilatation.  No calcified gallstones.  The spleen is normal. The pancreas is normal.  The adrenal glands are normal.  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  The aorta appears unremarkable.  IVC filter is in place.  Legs extend through the wall of the IVC and I do not see IVC patency below the filter.  In the pelvis, uterus appears normal.  Both ovaries appear normal.  No free fluid.  There is a large amount of fecal matter in the colon but otherwise no intestine bowel finding of note.  There are ischial tuberosity decubitus ulcers bilaterally.  There are some chronic bony changes in the ischial tuberosity regions.  IMPRESSION: Large amount of fecal matter in the colon.  No acute bowel finding otherwise.   IVC filter.  Scarring at the lung bases.  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  Bilateral decubitus ulcers in the ischial tuberosity regions. Chronic bony changes, particularly on the right.  Cannot rule out chronic osteomyelitis.  Original Report Authenticated By: Thomasenia Sales, M.D.     MDM  No instability her CT scan of cervical spine Plan patient can continue to take her hydromorphone at home as needed for pain Keep  scheduled appointment with her neurosurgeon in [place] next month A CD of today's CT scan was given to the patient Diagnosis cervical strain        Doug Sou, MD 04/06/12 1751  Doug Sou, MD 04/06/12 2117
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
## 2012-05-19 ENCOUNTER — Ambulatory Visit (INDEPENDENT_AMBULATORY_CARE_PROVIDER_SITE_OTHER): Payer: Medicare Other | Admitting: Obstetrics and Gynecology

## 2012-05-19 ENCOUNTER — Encounter: Payer: Self-pay | Admitting: Obstetrics and Gynecology

## 2012-05-19 ENCOUNTER — Other Ambulatory Visit: Payer: Self-pay | Admitting: Obstetrics and Gynecology

## 2012-05-19 VITALS — BP 96/66 | Wt 100.0 lb

## 2012-05-19 DIAGNOSIS — N912 Amenorrhea, unspecified: Secondary | ICD-10-CM

## 2012-05-19 DIAGNOSIS — N915 Oligomenorrhea, unspecified: Secondary | ICD-10-CM

## 2012-05-19 DIAGNOSIS — E559 Vitamin D deficiency, unspecified: Secondary | ICD-10-CM

## 2012-05-19 HISTORY — DX: Oligomenorrhea, unspecified: N91.5

## 2012-05-19 NOTE — Progress Notes (Signed)
Last Pap: 11/05/2011 Trying to Conceive: Yes C/o only spotting one day with cycle Labs today Scheduled labs day 3 and 21 Fasting labs scheduled RTO after all blood work done

## 2012-05-20 LAB — PROLACTIN: Prolactin: 9.4 ng/mL

## 2012-05-20 LAB — CBC
MCH: 29.3 pg (ref 26.0–34.0)
MCHC: 34.1 g/dL (ref 30.0–36.0)
MCV: 85.7 fL (ref 78.0–100.0)
Platelets: 260 10*3/uL (ref 150–400)
RDW: 14.5 % (ref 11.5–15.5)

## 2012-05-31 ENCOUNTER — Other Ambulatory Visit: Payer: Medicare Other

## 2012-05-31 DIAGNOSIS — N912 Amenorrhea, unspecified: Secondary | ICD-10-CM

## 2012-05-31 DIAGNOSIS — N915 Oligomenorrhea, unspecified: Secondary | ICD-10-CM

## 2012-06-01 LAB — LUTEINIZING HORMONE: LH: 12.4 m[IU]/mL

## 2012-06-03 NOTE — Progress Notes (Signed)
Dr. Su Hilt, the pt stated that she was not fasting and also stated that she has been evaluated for DM by home health nurse and various doctors in the past. Mathis Bud

## 2012-06-15 ENCOUNTER — Telehealth: Payer: Self-pay

## 2012-06-15 ENCOUNTER — Other Ambulatory Visit: Payer: Self-pay | Admitting: Obstetrics and Gynecology

## 2012-06-15 ENCOUNTER — Other Ambulatory Visit (INDEPENDENT_AMBULATORY_CARE_PROVIDER_SITE_OTHER): Payer: Medicare Other

## 2012-06-15 DIAGNOSIS — N915 Oligomenorrhea, unspecified: Secondary | ICD-10-CM

## 2012-06-15 DIAGNOSIS — N912 Amenorrhea, unspecified: Secondary | ICD-10-CM

## 2012-06-15 NOTE — Addendum Note (Signed)
Addended by: Larwance Rote on: 06/15/2012 03:52 PM   Modules accepted: Level of Service

## 2012-06-16 ENCOUNTER — Emergency Department (HOSPITAL_COMMUNITY): Payer: Medicare Other

## 2012-06-16 ENCOUNTER — Emergency Department (HOSPITAL_COMMUNITY)
Admission: EM | Admit: 2012-06-16 | Discharge: 2012-06-17 | Disposition: A | Discharge status: Home / Self Care | Payer: Medicare Other | Attending: Emergency Medicine | Admitting: Emergency Medicine

## 2012-06-16 ENCOUNTER — Encounter (HOSPITAL_COMMUNITY): Payer: Self-pay | Admitting: Emergency Medicine

## 2012-06-16 DIAGNOSIS — R109 Unspecified abdominal pain: Secondary | ICD-10-CM | POA: Insufficient documentation

## 2012-06-16 DIAGNOSIS — R112 Nausea with vomiting, unspecified: Secondary | ICD-10-CM | POA: Insufficient documentation

## 2012-06-16 DIAGNOSIS — N12 Tubulo-interstitial nephritis, not specified as acute or chronic: Secondary | ICD-10-CM | POA: Insufficient documentation

## 2012-06-16 DIAGNOSIS — G8220 Paraplegia, unspecified: Secondary | ICD-10-CM | POA: Insufficient documentation

## 2012-06-16 LAB — CBC WITH DIFFERENTIAL/PLATELET
Basophils Relative: 1 % (ref 0–1)
HCT: 35.8 % — ABNORMAL LOW (ref 36.0–46.0)
Hemoglobin: 12 g/dL (ref 12.0–15.0)
Lymphocytes Relative: 42 % (ref 12–46)
Lymphs Abs: 2.6 10*3/uL (ref 0.7–4.0)
MCHC: 33.5 g/dL (ref 30.0–36.0)
Monocytes Relative: 8 % (ref 3–12)
Neutro Abs: 2.7 10*3/uL (ref 1.7–7.7)
Neutrophils Relative %: 44 % (ref 43–77)
RBC: 4 MIL/uL (ref 3.87–5.11)
WBC: 6.1 10*3/uL (ref 4.0–10.5)

## 2012-06-16 LAB — PROGESTERONE: Progesterone: <0.2 ng/mL

## 2012-06-16 LAB — BASIC METABOLIC PANEL
BUN: 11 mg/dL (ref 6–23)
CO2: 29 mEq/L (ref 19–32)
Chloride: 102 mEq/L (ref 96–112)
GFR calc Af Amer: >90 mL/min (ref >90)
Potassium: 3.7 mEq/L (ref 3.5–5.1)

## 2012-06-16 MED ORDER — HYDROMORPHONE HCL PF 1 MG/ML IJ SOLN: 0.5000 mg | Freq: Once | INTRAMUSCULAR | Status: DC

## 2012-06-16 MED ORDER — ONDANSETRON HCL 4 MG/2ML IJ SOLN: 4.0000 mg | Freq: Once | INTRAMUSCULAR | Status: DC

## 2012-06-16 MED ORDER — SODIUM CHLORIDE 0.9 % IV BOLUS (SEPSIS): 500.0000 mL | Freq: Once | INTRAVENOUS | Status: DC

## 2012-06-16 NOTE — ED Notes (Signed)
C/o L flank and pelvic pain x 2 days.  States it has been hurting when her husband in and out caths her over the past couple of days.  States she is trying to get pregnant and she was seen at Urologist yesterday to see if she had a UTI and was told she didn't.

## 2012-06-16 NOTE — ED Provider Notes (Signed)
History     CSN: [id number]  Arrival date & time 06/16/12  1726   First MD Initiated Contact with Patient 06/16/12 05/30/2148      Chief Complaint  Patient presents with  . Flank Pain    (Consider location/radiation/quality/duration/timing/severity/associated sxs/prior treatment) Patient is a 26 y.o. female presenting with flank pain. The history is provided by the patient and the spouse.  Flank Pain This is a recurrent problem. The current episode started yesterday. The problem occurs constantly. The problem has been gradually worsening. Associated symptoms include nausea and vomiting. Pertinent negatives include no abdominal pain, chest pain, chills, coughing, fever, neck pain or rash. Exacerbated by: history of self-catheterizations for history of cervical spine injury. She has tried oral narcotics for the symptoms. The treatment provided moderate relief.    Past Medical History  Diagnosis Date  . Spinal cord injury, cervical region C4-C5  . Asthma Mild, only with exposure to cigarette smoke  . Sciatica   . Recurrent UTI requires self catheterization  . Abnormal Pap smear   . ASCUS (atypical squamous cells of undetermined significance) on Pap smear 10/2011  . Hx: UTI (urinary tract infection) 11/27/11  . H/O scoliosis     Past Surgical History  Procedure Date  . Vascular surgery IVC filter placed in 05-30-04  . Back surgery Scoliosis, Harrington Rods in place  . Orthopedic surgery Multiple lower extremity surgeries as a child    Family History  Problem Relation Age of Onset  . Heart disease Mother     Massive MI in May 30, 2005 ; died    History  Substance Use Topics  . Smoking status: Never Smoker   . Smokeless tobacco: Never Used  . Alcohol Use: No    OB History    Grav Para Term Preterm Abortions TAB SAB Ect Mult Living   0               Review of Systems  Constitutional: Negative for fever, chills, activity change and appetite change.  HENT: Negative for neck pain and  neck stiffness.   Respiratory: Negative for cough, chest tightness, shortness of breath and wheezing.   Cardiovascular: Negative for chest pain and palpitations.  Gastrointestinal: Positive for nausea and vomiting. Negative for abdominal pain, diarrhea and constipation.  Genitourinary: Positive for flank pain (left flank pain with radiation to the groin). Negative for dysuria, decreased urine volume and difficulty urinating.  Skin: Negative for rash and wound.  Neurological: Negative for seizures, syncope, facial asymmetry and light-headedness.  Psychiatric/Behavioral: Negative for confusion and agitation.  All other systems reviewed and are negative.    Allergies  Bactrim; Keflex; Metoclopramide hcl; and Benadryl  Home Medications   Current Outpatient Rx  Name Route Sig Dispense Refill  . ALBUTEROL SULFATE HFA 108 (90 BASE) MCG/ACT IN AERS Inhalation Inhale 2 puffs into the lungs every 6 (six) hours as needed. For wheezing    . AZO-CRANBERRY PO Oral Take 2 tablets by mouth every morning.    Marland Kitchen DIAZEPAM 10 MG PO TABS Oral Take 15 mg by mouth 2 (two) times daily. Pt takes 1 and 1/2 tab twice daily    . HYDROMORPHONE HCL 4 MG PO TABS Oral Take 4 mg by mouth 2 (two) times daily as needed. pain    . NITROFURANTOIN MACROCRYSTAL 100 MG PO CAPS Oral Take 100 mg by mouth at bedtime.     Marland Kitchen OVER THE COUNTER MEDICATION Oral Take 1 tablet by mouth every morning. Medication:AZO-YEAST    .  PANTOPRAZOLE SODIUM 40 MG PO TBEC Oral Take 40 mg by mouth every morning.     . AZO-STANDARD PO Oral Take 1 tablet by mouth daily as needed. For UTI    . PRENATAL PO Oral Take 1 tablet by mouth every morning.    Marland Kitchen PROMETHAZINE HCL 25 MG PO TABS Oral Take 25 mg by mouth every 6 (six) hours as needed. For nausea    . SOLIFENACIN SUCCINATE 10 MG PO TABS Oral Take 10 mg by mouth every morning.     Marland Kitchen ZOLPIDEM TARTRATE 10 MG PO TABS Oral Take 10 mg by mouth at bedtime.       BP 82/27  Pulse 55  Temp 97.4 F (36.3 C)  (Oral)  Resp 16  SpO2 100%  LMP 05/17/2012  Physical Exam  Nursing note and vitals reviewed. Constitutional: She is oriented to person, place, and time. She appears well-developed and well-nourished.  HENT:  Head: Normocephalic and atraumatic.  Right Ear: External ear normal.  Left Ear: External ear normal.  Nose: Nose normal.  Mouth/Throat: Oropharynx is clear and moist. No oropharyngeal exudate.  Eyes: Conjunctivae are normal. Pupils are equal, round, and reactive to light.  Neck: Normal range of motion. Neck supple.  Cardiovascular: Normal rate, regular rhythm, normal heart sounds and intact distal pulses.  Exam reveals no gallop and no friction rub.   No murmur heard. Pulmonary/Chest: Effort normal and breath sounds normal. No respiratory distress. She has no wheezes. She has no rales. She exhibits no tenderness.  Abdominal: Soft. Bowel sounds are normal. She exhibits no distension and no mass. There is tenderness (moderate left-sided CVA TTP). There is no rebound and no guarding.  Musculoskeletal:       Well-healed scars overlying bilateral knees Decreased muscle strength in bilateral legs   Neurological: She is alert and oriented to person, place, and time.  Skin: Skin is warm and dry.  Psychiatric: She has a normal mood and affect. Her behavior is normal. Judgment and thought content normal.    ED Course  Procedures (including critical care time)  Labs Reviewed  CBC WITH DIFFERENTIAL - Abnormal; Notable for the following:    HCT 35.8 (*)     All other components within normal limits  URINALYSIS, ROUTINE W REFLEX MICROSCOPIC - Abnormal; Notable for the following:    Color, Urine AMBER (*)  BIOCHEMICALS MAY BE AFFECTED BY COLOR   APPearance CLOUDY (*)     Ketones, ur 15 (*)     Nitrite POSITIVE (*)     Leukocytes, UA SMALL (*)     All other components within normal limits  URINE MICROSCOPIC-ADD ON - Abnormal; Notable for the following:    Bacteria, UA FEW (*)     All  other components within normal limits  BASIC METABOLIC PANEL  URINE CULTURE   US Renal  06/16/2012  *RADIOLOGY REPORT*  Clinical Data: Left flank pain.  History of horseshoe kidney.  RENAL/URINARY TRACT ULTRASOUND COMPLETE  Comparison:  Ultrasound 08/05/2011.  C t abdomen pelvis 03/10/2012.  Findings:  Right Kidney:  The right renal fossa is clear.  There appears represent crossed fused right renal ectopia to the left.  Left Kidney:  The left renal gland is measured at 8.9 cm length. No hydronephrosis demonstrated in either the left kidney or the fused right kidney.  No parenchymal atrophy.  No focal parenchymal mass demonstrated.  Bladder:  The bladder is incompletely distended without evidence of significant wall thickening or filling defect.  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  No evidence of renal mass or hydronephrosis.  Original Report Authenticated By: Marlon Pel, M.D.     1. Pyelonephritis       MDM  26 yo F w/hx of paraplegia secondary to spinal cord injury and recurrent ureterolithiasis presents with several days of left flank pain with radiation to the groin and associated nausea/emesis. AFVSS. Moderate tenderness overlying left kidney but abdominal exam benign. Patient tolerating oral intake in ED and clinical picture not concerning for dehydration. Patient had abdominal CT scan March 20th (reviewed by me) showing horseshoe-shaped left kidney and atrophic right kidney but no ureterolithiasis. Ultrasound negative for evidence of ureterolithiasis or hydronephrosis. U/A consistent with UTI in this patient who self-catheterizes. Suspect pyelonephritis. Patient has documented allergy to Keflex; however, she has recently tolerated keflex prescribed by her urologist. Will administer Rocephin and provide course of Keflex. Patient given return precautions, including worsening of signs or symptoms. Patient instructed to follow-up  with her urologist regarding pyelonephritis.           Clemetine Marker, MD 06/17/12 [phone]

## 2012-06-16 NOTE — ED Notes (Signed)
Pt alert and oriented and in no apparent distress. Triage nurse notified of drop in vital signs.

## 2012-06-17 LAB — URINALYSIS, ROUTINE W REFLEX MICROSCOPIC
Bilirubin Urine: NEGATIVE
Glucose, UA: NEGATIVE mg/dL
Nitrite: POSITIVE — AB
Specific Gravity, Urine: 1.029 (ref 1.005–1.030)
pH: 6.5 (ref 5.0–8.0)

## 2012-06-17 LAB — URINE MICROSCOPIC-ADD ON

## 2012-06-17 MED ORDER — CEFTRIAXONE SODIUM 1 G IJ SOLR
1.0000 g | Freq: Once | INTRAMUSCULAR | Status: AC
  Administered 2012-06-17: 1 g via INTRAMUSCULAR
  Filled 2012-06-17: qty 10

## 2012-06-17 MED ORDER — HYDROMORPHONE HCL PF 1 MG/ML IJ SOLN
1.0000 mg | Freq: Once | INTRAMUSCULAR | Status: AC
  Administered 2012-06-17: 1 mg via INTRAMUSCULAR
  Filled 2012-06-17: qty 1

## 2012-06-17 MED ORDER — CIPROFLOXACIN HCL 500 MG PO TABS: 500.0000 mg | ORAL_TABLET | Freq: Two times a day (BID) | ORAL | Status: AC

## 2012-06-17 MED ORDER — ONDANSETRON HCL 8 MG PO TABS
4.0000 mg | ORAL_TABLET | Freq: Once | ORAL | Status: AC
  Administered 2012-06-17: 4 mg via ORAL
  Filled 2012-06-17: qty 2

## 2012-06-17 MED ORDER — CEPHALEXIN 500 MG PO CAPS: 500.0000 mg | ORAL_CAPSULE | Freq: Four times a day (QID) | ORAL | Status: AC

## 2012-06-17 MED ORDER — CIPROFLOXACIN HCL 500 MG PO TABS
500.0000 mg | ORAL_TABLET | Freq: Once | ORAL | Status: AC
  Administered 2012-06-17: 500 mg via ORAL
  Filled 2012-06-17: qty 1

## 2012-06-17 MED ORDER — CIPROFLOXACIN IN D5W 400 MG/200ML IV SOLN: 400.0000 mg | Freq: Once | INTRAVENOUS | Status: DC

## 2012-06-17 MED ORDER — LIDOCAINE HCL (PF) 1 % IJ SOLN
INTRAMUSCULAR | Status: AC
  Administered 2012-06-17: 2 mL
  Filled 2012-06-17: qty 5

## 2012-06-17 NOTE — Discharge Instructions (Signed)
Pyelonephritis, Adult Pyelonephritis is a kidney infection. A kidney infection can happen quickly, or it can last for a long time. HOME CARE   Take your medicine (antibiotics) as told. Finish it even if you start to feel better.   Keep all doctor visits as told.   Drink enough fluids to keep your pee (urine) clear or pale yellow.   Only take medicine as told by your doctor.  GET HELP RIGHT AWAY IF:   You have a fever.   You cannot take your medicine or drink fluids as told.   You have chills and shaking.   You feel very weak or pass out (faint).   You do not feel better after 2 days.  MAKE SURE YOU:  Understand these instructions.   Will watch your condition.   Will get help right away if you are not doing well or get worse.  Document Released: 01/15/2005 Document Revised: 11/27/2011 Document Reviewed: 05/28/2011 [facility] Patient Information 2012 [place], [place].Urinary Tract Infection Infections of the urinary tract can start in several places. A bladder infection (cystitis), a kidney infection (pyelonephritis), and a prostate infection (prostatitis) are different types of urinary tract infections (UTIs). They usually get better if treated with medicines (antibiotics) that kill germs. Take all the medicine until it is gone. You or your child may feel better in a few days, but TAKE ALL MEDICINE or the infection may not respond and may become more difficult to treat. HOME CARE INSTRUCTIONS   Drink enough water and fluids to keep the urine clear or pale yellow. Cranberry juice is especially recommended, in addition to large amounts of water.   Avoid caffeine, tea, and carbonated beverages. They tend to irritate the bladder.   Alcohol may irritate the prostate.   Only take over-the-counter or prescription medicines for pain, discomfort, or fever as directed by your caregiver.  To prevent further infections:  Empty the bladder often. Avoid holding urine for long periods of  time.   After a bowel movement, women should cleanse from front to back. Use each tissue only once.   Empty the bladder before and after sexual intercourse.  FINDING OUT THE RESULTS OF YOUR TEST Not all test results are available during your visit. If your or your child's test results are not back during the visit, make an appointment with your caregiver to find out the results. Do not assume everything is normal if you have not heard from your caregiver or the medical facility. It is important for you to follow up on all test results. SEEK MEDICAL CARE IF:   There is back pain.   Your baby is older than 3 months with a rectal temperature of 100.5 F (38.1 C) or higher for more than 1 day.   Your or your child's problems (symptoms) are no better in 3 days. Return sooner if you or your child is getting worse.  SEEK IMMEDIATE MEDICAL CARE IF:   There is severe back pain or lower abdominal pain.   You or your child develops chills.   You have a fever.   Your baby is older than 3 months with a rectal temperature of 102 F (38.9 C) or higher.   Your baby is 89 months old or younger with a rectal temperature of 100.4 F (38 C) or higher.   There is nausea or vomiting.   There is continued burning or discomfort with urination.  MAKE SURE YOU:   Understand these instructions.   Will watch your condition.  Will get help right away if you are not doing well or get worse.  Document Released: 09/17/2005 Document Revised: 11/27/2011 Document Reviewed: 04/22/2007 [facility] Patient Information 2012 [place], [place].Urine Culture Collection, Female  You will collect a sample of pee (urine) in a cup. Read the instructions below before beginning. If you have any questions, ask the nurse before you begin. Follow the instructions carefully. 1. Wash your hands with soap and water and dry them thoroughly.  2. Open the lid of the cup. Be careful not to touch the inside.  3. Clean the private  (genital) area. 1. Sit over the toilet. Use the fingers of one hand to separate and hold open the folds of the skin in your private area.  2. Clean the pee (urinary) opening and surrounding area with the gauze, wiping from front to back. Throw away the gauze in the trash, not the toilet.  3. Repeat step "b"2 more times.  4. With the folds of skin still separated, pee a small amount into toilet. STOP, then pee into the cup. Fill the cup half way.  5. Put the lid on the cup tightly.  6. Wash your hands with soap and water.  7. If you were given a label, put the label on the cup.  8. Give the cup to the nurse.  Document Released: 11/20/2008 Document Revised: 11/27/2011 Document Reviewed: 11/20/2008 [facility] Patient Information 2012 [place], [place].

## 2012-06-17 NOTE — ED Notes (Signed)
[facility] called to transport pt home 

## 2012-06-17 NOTE — ED Provider Notes (Signed)
26 year old female who is a paraplegic from a spinal cord injury has been having left flank pain. She saw her urologist and reportedly had a negative urine. She's worried about kidney stone. She's been having fever, chills, sweats. On exam, she does have left CVA tenderness with no gum O. tenderness. She had a CT scan done in March and I reviewed the images and there was no nephrolithiasis seen. 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 Ultrasound tonight showed no evidence of hydronephrosis and no echogenic calculi. Urinalysis is pending.  Dione Booze, MD 06/17/12 [phone]

## 2012-06-18 LAB — URINE CULTURE

## 2012-06-18 LAB — GLUCOSE, RANDOM: Glucose, Bld: 80 mg/dL (ref 70–99)

## 2012-06-18 NOTE — ED Provider Notes (Signed)
I saw and evaluated the patient, reviewed the resident's note and I agree with the findings and plan.   Dione Booze, MD 06/18/12 0010

## 2012-07-01 ENCOUNTER — Encounter (HOSPITAL_COMMUNITY): Payer: Self-pay | Admitting: *Deleted

## 2012-07-01 ENCOUNTER — Emergency Department (INDEPENDENT_AMBULATORY_CARE_PROVIDER_SITE_OTHER)
Admission: EM | Admit: 2012-07-01 | Discharge: 2012-07-01 | Disposition: A | Discharge status: Home / Self Care | Payer: Medicare Other | Source: Home / Self Care | Attending: Emergency Medicine | Admitting: Emergency Medicine

## 2012-07-01 DIAGNOSIS — N390 Urinary tract infection, site not specified: Secondary | ICD-10-CM

## 2012-07-01 HISTORY — DX: Urinary tract infection, site not specified: N39.0

## 2012-07-01 HISTORY — DX: Calculus of kidney: N20.0

## 2012-07-01 HISTORY — DX: Renal agenesis, unilateral: Q60.0

## 2012-07-01 HISTORY — DX: Lobulated, fused and horseshoe kidney: Q63.1

## 2012-07-01 LAB — POCT URINALYSIS DIP (DEVICE)
Bilirubin Urine: NEGATIVE
Hgb urine dipstick: NEGATIVE
Ketones, ur: NEGATIVE mg/dL
Protein, ur: NEGATIVE mg/dL
Specific Gravity, Urine: 1.025 (ref 1.005–1.030)
pH: 6.5 (ref 5.0–8.0)

## 2012-07-01 LAB — URINALYSIS, ROUTINE W REFLEX MICROSCOPIC
Hgb urine dipstick: NEGATIVE
Nitrite: NEGATIVE
Specific Gravity, Urine: 1.026 (ref 1.005–1.030)
Urobilinogen, UA: 1 mg/dL (ref 0.0–1.0)

## 2012-07-01 LAB — URINE MICROSCOPIC-ADD ON

## 2012-07-01 MED ORDER — CIPROFLOXACIN HCL 500 MG PO TABS: 500.0000 mg | ORAL_TABLET | Freq: Two times a day (BID) | ORAL | Status: AC

## 2012-07-01 MED ORDER — PHENAZOPYRIDINE HCL 200 MG PO TABS: 200.0000 mg | ORAL_TABLET | Freq: Three times a day (TID) | ORAL | Status: AC | PRN

## 2012-07-01 NOTE — ED Provider Notes (Signed)
History     CSN: [id number]  Arrival date & time 07/01/12  1756   First MD Initiated Contact with Patient 07/01/12 1757      Chief Complaint  Patient presents with  . Urinary Tract Infection    (Consider location/radiation/quality/duration/timing/severity/associated sxs/prior treatment) HPI Comments: Patient reports urinary urgency, frequency, left flank and lower back pain for several days. She is increasing her fluids, taking AZO without relief. 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  Husband states that the urine has generally been clear, nonodorous with occasional sediment. No hematuria. She was seen in the ER on 6/25 for left flank pain, reporting fever, chills, sweats with left CVA tenderness. Ultrasound was negative for hydro, or echogenic stone. She was thought to have pyelonephritis, was given Rocephin and sent home on Keflex. Udip positive for nitrite and leukocytes, but Urine culture was negative. She states she finished all the Keflex, and was getting better, but her symptoms recurred shortly thereafter. She also reports some irregular vaginal bleeding, states that she has gotten her period "twice" this month. She is currently not on birth control, and is trying to become pregnant with her husband. No vaginal discharge. Reports no the past 2 weeks vomiting, fevers over the past several days. Reports lower abdominal distention, decreased appetite, nausea in the morning. States she's having regular bowel movements.  Also has history of recurrent kidney stones. No history of pregnancy, STIs.  ROS as noted in HPI. All other ROS negative.   Patient is a 26 y.o. female presenting with dysuria. The history is provided by the patient. No language interpreter was used.  Dysuria  This is a recurrent problem. The current episode started more than 2 days ago. The quality of the pain is  described as burning and aching. There has been no fever. She is sexually active. There is a history of pyelonephritis. Associated symptoms include frequency, urgency and flank pain. Pertinent negatives include no chills, no vomiting, no discharge and no hematuria. She has tried antibiotics for the symptoms. Her past medical history is significant for kidney stones, single kidney, urological procedure, recurrent UTIs and catheterization.    Past Medical History  Diagnosis Date  . Spinal cord injury, cervical region C4-C5  . Asthma Mild, only with exposure to cigarette smoke  . Sciatica   . Recurrent UTI requires self catheterization  . Abnormal Pap smear   . ASCUS (atypical squamous cells of undetermined significance) on Pap smear 10/2011  . Hx: UTI (urinary tract infection) 11/27/11  . H/O scoliosis   . Urinary tract infection   . Horseshoe kidney   . Congenital absence of right kidney   . Recurrent nephrolithiasis     s/p stent    Past Surgical History  Procedure Date  . Vascular surgery IVC filter placed in 05/20/2004  . Back surgery Scoliosis, Harrington Rods in place  . Orthopedic surgery Multiple lower extremity surgeries as a child  . Lithotripsy     Family History  Problem Relation Age of Onset  . Heart disease Mother     Massive MI in 05-20-2005 ; died    History  Substance Use Topics  . Smoking status: Never Smoker   . Smokeless tobacco: Never Used  . Alcohol Use: No    OB History    Grav Para Term Preterm Abortions TAB SAB Ect Mult Living   0  Review of Systems  Constitutional: Negative for chills.  Gastrointestinal: Negative for vomiting.  Genitourinary: Positive for dysuria, urgency, frequency and flank pain. Negative for hematuria.    Allergies  Bactrim; Metoclopramide hcl; and Benadryl  Home Medications   Current Outpatient Rx  Name Route Sig Dispense Refill  . DIAZEPAM 10 MG PO TABS Oral Take 15 mg by mouth 2 (two) times daily. Pt takes 1  and 1/2 tab twice daily    . HYDROMORPHONE HCL 4 MG PO TABS Oral Take 4 mg by mouth 2 (two) times daily as needed. pain    . NITROFURANTOIN MACROCRYSTAL 100 MG PO CAPS Oral Take 100 mg by mouth at bedtime.     Marland Kitchen OVER THE COUNTER MEDICATION Oral Take 1 tablet by mouth every morning. Medication:AZO-YEAST    . OVER THE COUNTER MEDICATION  Acid reflux med    . PRENATAL PO Oral Take 1 tablet by mouth every morning.    Marland Kitchen PROMETHAZINE HCL 25 MG PO TABS Oral Take 25 mg by mouth every 6 (six) hours as needed. For nausea    . SOLIFENACIN SUCCINATE 10 MG PO TABS Oral Take 10 mg by mouth every morning.     Marland Kitchen ZOLPIDEM TARTRATE 10 MG PO TABS Oral Take 10 mg by mouth at bedtime.     . ALBUTEROL SULFATE HFA 108 (90 BASE) MCG/ACT IN AERS Inhalation Inhale 2 puffs into the lungs every 6 (six) hours as needed. For wheezing    . CIPROFLOXACIN HCL 500 MG PO TABS Oral Take 1 tablet (500 mg total) by mouth 2 (two) times daily. X 7 days 14 tablet 0  . PANTOPRAZOLE SODIUM 40 MG PO TBEC Oral Take 40 mg by mouth every morning.     Marland Kitchen PHENAZOPYRIDINE HCL 200 MG PO TABS Oral Take 1 tablet (200 mg total) by mouth 3 (three) times daily as needed for pain. 6 tablet 0    BP 116/76  Pulse 88  Temp 98.3 F (36.8 C) (Oral)  Resp 20  SpO2 98%  LMP 06/21/2012  Physical Exam  Nursing note and vitals reviewed. Constitutional: She is oriented to person, place, and time. She appears well-developed and well-nourished. No distress.  HENT:  Head: Normocephalic and atraumatic.  Eyes: Conjunctivae and EOM are normal.  Neck: Normal range of motion.  Cardiovascular: Normal rate and regular rhythm.   Pulmonary/Chest: Effort normal.  Abdominal: Normal appearance and bowel sounds are normal. She exhibits no distension. There is tenderness in the suprapubic area. There is CVA tenderness. There is no rigidity, no rebound and no guarding.       Suprapubic tenderness, tenderness at left UVJ.  Musculoskeletal: Normal range of motion.    Neurological: She is alert and oriented to person, place, and time. Coordination normal.  Skin: Skin is warm and dry.  Psychiatric: She has a normal mood and affect. Her behavior is normal. Judgment and thought content normal.    ED Course  Procedures (including critical care time)  Labs Reviewed  POCT URINALYSIS DIP (DEVICE) - Abnormal; Notable for the following:    Leukocytes, UA TRACE (*)  Biochemical Testing Only. Please order routine urinalysis from main lab if confirmatory testing is needed.   All other components within normal limits  POCT PREGNANCY, URINE  URINALYSIS, ROUTINE W REFLEX MICROSCOPIC  URINE CULTURE   No results found.   1. UTI (lower urinary tract infection)     Results for orders placed during the hospital encounter of 07/01/12  POCT URINALYSIS DIP (  DEVICE)      Component Value Range   Glucose, UA NEGATIVE  NEGATIVE mg/dL   Bilirubin Urine NEGATIVE  NEGATIVE   Ketones, ur NEGATIVE  NEGATIVE mg/dL   Specific Gravity, Urine 1.025  1.005 - 1.030   Hgb urine dipstick NEGATIVE  NEGATIVE   pH 6.5  5.0 - 8.0   Protein, ur NEGATIVE  NEGATIVE mg/dL   Urobilinogen, UA 1.0  0.0 - 1.0 mg/dL   Nitrite NEGATIVE  NEGATIVE   Leukocytes, UA TRACE (*) NEGATIVE  POCT PREGNANCY, URINE      Component Value Range   Preg Test, Ur NEGATIVE  NEGATIVE    MDM  Previous records reviewed. As noted in hpi.  Labs reviewed. . Patient afebrile, normotensive. She appears nontoxic, and mildly uncomfortable. is tolerating by mouth. H&P most consistent with recurring UTI. She may have low-grade inflammation from recurrent catheterizations. Doubt pyelonephritis, or obstructing kidney stone at this time. Will start her on Cipro as a second line antibiotic, as she just finished Keflex and is on baseline Macrobid. Will have her start probiotics as well, think that the distention may be from the recent antibiotics. Discussed labs, MDM with patient and husband. Gave strict return precautions,  Discussed signs and symptoms that should prompt immediate return to the ER. Patient and husband agreed with plan.  Luiz Blare, MD 07/01/12 [id number]

## 2012-07-01 NOTE — ED Notes (Addendum)
Pt seen and treated [place] ed treated UTI keflex pt completed antibiotic - pt with c/o left flank pain low abd pain x approx one week - denies vaginal discharge - pt husband in and out cath pt everyday

## 2012-07-01 NOTE — ED Notes (Signed)
In and out cath completed pt requested husband insert catheter RN cleansed area and assisted husband inserted catheter wearing sterile gloves sterile technique with RN

## 2012-07-02 LAB — URINE CULTURE

## 2012-07-06 ENCOUNTER — Telehealth: Payer: Self-pay | Admitting: Obstetrics and Gynecology

## 2012-07-06 NOTE — Telephone Encounter (Signed)
[name]

## 2012-07-07 ENCOUNTER — Telehealth: Payer: Self-pay | Admitting: Obstetrics and Gynecology

## 2012-07-07 NOTE — Telephone Encounter (Signed)
[name] °

## 2012-07-12 ENCOUNTER — Telehealth: Payer: Self-pay | Admitting: Obstetrics and Gynecology

## 2012-07-12 NOTE — Telephone Encounter (Signed)
Triage/epic 

## 2012-07-12 NOTE — Telephone Encounter (Signed)
Ar pt 

## 2012-10-13 ENCOUNTER — Telehealth: Payer: Self-pay | Admitting: Obstetrics and Gynecology

## 2012-10-13 NOTE — Telephone Encounter (Signed)
Spoke with pt rgd msg pt was progesterone results from June advised pt both test done in June showed no ovulation pt voice understanding

## 2012-10-25 NOTE — Telephone Encounter (Signed)
Note to close enc. Iva Posten, Jacqueline A  

## 2013-04-08 ENCOUNTER — Emergency Department (HOSPITAL_COMMUNITY)
Admission: EM | Admit: 2013-04-08 | Discharge: 2013-04-08 | Disposition: A | Discharge status: Home / Self Care | Payer: Medicare Other | Attending: Emergency Medicine | Admitting: Emergency Medicine

## 2013-04-08 ENCOUNTER — Encounter (HOSPITAL_COMMUNITY): Payer: Self-pay

## 2013-04-08 ENCOUNTER — Emergency Department (HOSPITAL_COMMUNITY): Payer: Medicare Other

## 2013-04-08 DIAGNOSIS — Z3202 Encounter for pregnancy test, result negative: Secondary | ICD-10-CM | POA: Insufficient documentation

## 2013-04-08 DIAGNOSIS — Z87440 Personal history of urinary (tract) infections: Secondary | ICD-10-CM | POA: Insufficient documentation

## 2013-04-08 DIAGNOSIS — Z87828 Personal history of other (healed) physical injury and trauma: Secondary | ICD-10-CM | POA: Insufficient documentation

## 2013-04-08 DIAGNOSIS — Z87442 Personal history of urinary calculi: Secondary | ICD-10-CM | POA: Insufficient documentation

## 2013-04-08 DIAGNOSIS — R5383 Other fatigue: Secondary | ICD-10-CM | POA: Insufficient documentation

## 2013-04-08 DIAGNOSIS — Z87448 Personal history of other diseases of urinary system: Secondary | ICD-10-CM | POA: Insufficient documentation

## 2013-04-08 DIAGNOSIS — R110 Nausea: Secondary | ICD-10-CM | POA: Insufficient documentation

## 2013-04-08 DIAGNOSIS — J45909 Unspecified asthma, uncomplicated: Secondary | ICD-10-CM | POA: Insufficient documentation

## 2013-04-08 DIAGNOSIS — R1012 Left upper quadrant pain: Secondary | ICD-10-CM | POA: Insufficient documentation

## 2013-04-08 DIAGNOSIS — R5381 Other malaise: Secondary | ICD-10-CM | POA: Insufficient documentation

## 2013-04-08 DIAGNOSIS — Z79899 Other long term (current) drug therapy: Secondary | ICD-10-CM | POA: Insufficient documentation

## 2013-04-08 DIAGNOSIS — Z8739 Personal history of other diseases of the musculoskeletal system and connective tissue: Secondary | ICD-10-CM | POA: Insufficient documentation

## 2013-04-08 DIAGNOSIS — Z9889 Other specified postprocedural states: Secondary | ICD-10-CM | POA: Insufficient documentation

## 2013-04-08 DIAGNOSIS — M549 Dorsalgia, unspecified: Secondary | ICD-10-CM | POA: Insufficient documentation

## 2013-04-08 DIAGNOSIS — R109 Unspecified abdominal pain: Secondary | ICD-10-CM

## 2013-04-08 LAB — URINALYSIS, ROUTINE W REFLEX MICROSCOPIC
Glucose, UA: NEGATIVE mg/dL
Ketones, ur: 40 mg/dL — AB
Protein, ur: NEGATIVE mg/dL
pH: 7 (ref 5.0–8.0)

## 2013-04-08 LAB — CBC WITH DIFFERENTIAL/PLATELET
Eosinophils Absolute: 0.1 10*3/uL (ref 0.0–0.7)
Eosinophils Relative: 2 % (ref 0–5)
HCT: 36.9 % (ref 36.0–46.0)
Lymphocytes Relative: 31 % (ref 12–46)
Lymphs Abs: 1.9 10*3/uL (ref 0.7–4.0)
MCH: 30.3 pg (ref 26.0–34.0)
MCV: 88.7 fL (ref 78.0–100.0)
Monocytes Absolute: 0.5 10*3/uL (ref 0.1–1.0)
Monocytes Relative: 8 % (ref 3–12)
RBC: 4.16 MIL/uL (ref 3.87–5.11)
WBC: 6.2 10*3/uL (ref 4.0–10.5)

## 2013-04-08 LAB — COMPREHENSIVE METABOLIC PANEL
AST: 12 U/L (ref 0–37)
Albumin: 3.5 g/dL (ref 3.5–5.2)
Alkaline Phosphatase: 58 U/L (ref 39–117)
Chloride: 102 mEq/L (ref 96–112)
Potassium: 3.8 mEq/L (ref 3.5–5.1)
Total Bilirubin: 0.4 mg/dL (ref 0.3–1.2)
Total Protein: 6.8 g/dL (ref 6.0–8.3)

## 2013-04-08 LAB — URINE MICROSCOPIC-ADD ON

## 2013-04-08 MED ORDER — PANTOPRAZOLE SODIUM 40 MG IV SOLR
40.0000 mg | Freq: Once | INTRAVENOUS | Status: AC
  Administered 2013-04-08: 40 mg via INTRAVENOUS
  Filled 2013-04-08: qty 40

## 2013-04-08 MED ORDER — IOHEXOL 300 MG/ML  SOLN
50.0000 mL | Freq: Once | INTRAMUSCULAR | Status: AC | PRN
  Administered 2013-04-08: 50 mL via ORAL

## 2013-04-08 MED ORDER — HYDROMORPHONE HCL PF 1 MG/ML IJ SOLN
1.0000 mg | Freq: Once | INTRAMUSCULAR | Status: AC
  Administered 2013-04-08: 1 mg via INTRAVENOUS
  Filled 2013-04-08: qty 1

## 2013-04-08 MED ORDER — ONDANSETRON HCL 4 MG/2ML IJ SOLN
4.0000 mg | Freq: Once | INTRAMUSCULAR | Status: AC
  Administered 2013-04-08: 4 mg via INTRAVENOUS
  Filled 2013-04-08: qty 2

## 2013-04-08 MED ORDER — IOHEXOL 300 MG/ML  SOLN
80.0000 mL | Freq: Once | INTRAMUSCULAR | Status: AC | PRN
  Administered 2013-04-08: 80 mL via INTRAVENOUS
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
## 2013-04-08 NOTE — ED Notes (Signed)
FAO:[id number]<MV> Expected date:<BR> Expected time:<BR> Means of arrival:<BR> Comments:<BR> Heart burn

## 2013-04-08 NOTE — ED Notes (Signed)
Patient is alert and oriented x3.  She was given DC instructions and follow up visit instructions.  Patient gave verbal understanding. She was DC via stretcher to home.  V/S stable.  He was not showing any signs of distress on DC  

## 2013-04-08 NOTE — ED Provider Notes (Signed)
History     CSN: [id number]  Arrival date & time 04/08/13  29-May-1716   First MD Initiated Contact with Patient 04/08/13 1746      Chief Complaint  Patient presents with  . Abdominal Pain    (Consider location/radiation/quality/duration/timing/severity/associated sxs/prior treatment) HPI Presents with abdominal pain epigastric and left upper quadrant radiating to her throat onset 2 weeks ago accompanied by nausea. Treated with proton this morning without relief. Denies vomiting denies fever. Last bowel movement yesterday, normal. She is currently on her menses. Nothing makes symptoms better or worse. No other associated symptoms Past Medical History  Diagnosis Date  . Spinal cord injury, cervical region C4-C5  . Asthma Mild, only with exposure to cigarette smoke  . Sciatica   . Recurrent UTI requires self catheterization  . Abnormal Pap smear   . ASCUS (atypical squamous cells of undetermined significance) on Pap smear 10/2011  . Hx: UTI (urinary tract infection) 11/27/11  . H/O scoliosis   . Urinary tract infection   . Horseshoe kidney   . Congenital absence of right kidney   . Recurrent nephrolithiasis     s/p stent    Past Surgical History  Procedure Laterality Date  . Back surgery  Scoliosis, Harrington Rods in place  . Orthopedic surgery  Multiple lower extremity surgeries as a child  . Lithotripsy    . Vascular surgery  IVC filter placed in 05/29/2004    Family History  Problem Relation Age of Onset  . Heart disease Mother     Massive MI in May 29, 2005 ; died    History  Substance Use Topics  . Smoking status: Never Smoker   . Smokeless tobacco: Never Used  . Alcohol Use: No    OB History   Grav Para Term Preterm Abortions TAB SAB Ect Mult Living   0               Review of Systems  HENT: Negative.   Respiratory: Negative.   Cardiovascular: Negative.   Gastrointestinal: Positive for nausea and abdominal pain.       Requires digital stimulation the trigger bowel  movements  Genitourinary:       Currently on menses  Musculoskeletal: Positive for back pain.       Chronic back pain  Skin: Negative.   Neurological: Positive for weakness.       Quadriparesis  Psychiatric/Behavioral: Negative.   All other systems reviewed and are negative.    Allergies  Bactrim; Metoclopramide hcl; and Benadryl  Home Medications   Current Outpatient Rx  Name  Route  Sig  Dispense  Refill  . albuterol (PROVENTIL HFA;VENTOLIN HFA) 108 (90 BASE) MCG/ACT inhaler   Inhalation   Inhale 2 puffs into the lungs every 6 (six) hours as needed. For wheezing         . diazepam (VALIUM) 10 MG tablet   Oral   Take 15 mg by mouth 2 (two) times daily. Pt takes 1 and 1/2 tab twice daily         . HYDROmorphone (DILAUDID) 4 MG tablet   Oral   Take 4 mg by mouth 2 (two) times daily as needed. pain         . nitrofurantoin (MACRODANTIN) 100 MG capsule   Oral   Take 100 mg by mouth at bedtime.          Marland Kitchen OVER THE COUNTER MEDICATION   Oral   Take 1 tablet by mouth every morning. Medication:AZO-YEAST         .  OVER THE COUNTER MEDICATION      Acid reflux med         . pantoprazole (PROTONIX) 40 MG tablet   Oral   Take 40 mg by mouth every morning.          . solifenacin (VESICARE) 10 MG tablet   Oral   Take 10 mg by mouth every morning.          . zolpidem (AMBIEN) 10 MG tablet   Oral   Take 10 mg by mouth at bedtime.            BP 103/54  Pulse 52  Temp(Src) 98.2 F (36.8 C) (Oral)  Resp 18  SpO2 97%  LMP 04/08/2013  Physical Exam  Nursing note and vitals reviewed. Constitutional: She is oriented to person, place, and time.  Chronically ill-appearing  HENT:  Head: Normocephalic and atraumatic.  Eyes: Conjunctivae are normal. Pupils are equal, round, and reactive to light.  Neck: Neck supple. No tracheal deviation present. No thyromegaly present.  Cardiovascular: Normal rate and regular rhythm.   No murmur  heard. Pulmonary/Chest: Effort normal and breath sounds normal.  Abdominal: Soft. Bowel sounds are normal. She exhibits no distension and no mass. There is tenderness. There is no rebound.  Tender at epigastrium and left upper quadrant  Musculoskeletal: She exhibits no edema and no tenderness.  Muscular atrophy all 4 extremities  Neurological: She is alert and oriented to person, place, and time. Coordination normal.  Moves all extremities  Skin: Skin is warm and dry. No rash noted.  Psychiatric: She has a normal mood and affect.    ED Course  Procedures (including critical care time)  Labs Reviewed - No data to display No results found.   No diagnosis found.  Results for orders placed during the hospital encounter of 04/08/13  COMPREHENSIVE METABOLIC PANEL      Result Value Range   Sodium 135  135 - 145 mEq/L   Potassium 3.8  3.5 - 5.1 mEq/L   Chloride 102  96 - 112 mEq/L   CO2 25  19 - 32 mEq/L   Glucose, Bld 96  70 - 99 mg/dL   BUN 15  6 - 23 mg/dL   Creatinine, Ser 1.32 (*) 0.50 - 1.10 mg/dL   Calcium 9.1  8.4 - 44.0 mg/dL   Total Protein 6.8  6.0 - 8.3 g/dL   Albumin 3.5  3.5 - 5.2 g/dL   AST 12  0 - 37 U/L   ALT 6  0 - 35 U/L   Alkaline Phosphatase 58  39 - 117 U/L   Total Bilirubin 0.4  0.3 - 1.2 mg/dL   GFR calc non Af Amer >90  >90 mL/min   GFR calc Af Amer >90  >90 mL/min  LIPASE, BLOOD      Result Value Range   Lipase 21  11 - 59 U/L  CBC WITH DIFFERENTIAL      Result Value Range   WBC 6.2  4.0 - 10.5 K/uL   RBC 4.16  3.87 - 5.11 MIL/uL   Hemoglobin 12.6  12.0 - 15.0 g/dL   HCT 10.2  72.5 - 36.6 %   MCV 88.7  78.0 - 100.0 fL   MCH 30.3  26.0 - 34.0 pg   MCHC 34.1  30.0 - 36.0 g/dL   RDW 44.0  34.7 - 42.5 %   Platelets 148 (*) 150 - 400 K/uL   Neutrophils Relative 59  43 -  77 %   Neutro Abs 3.6  1.7 - 7.7 K/uL   Lymphocytes Relative 31  12 - 46 %   Lymphs Abs 1.9  0.7 - 4.0 K/uL   Monocytes Relative 8  3 - 12 %   Monocytes Absolute 0.5  0.1 - 1.0  K/uL   Eosinophils Relative 2  0 - 5 %   Eosinophils Absolute 0.1  0.0 - 0.7 K/uL   Basophils Relative 1  0 - 1 %   Basophils Absolute 0.0  0.0 - 0.1 K/uL  URINALYSIS, ROUTINE W REFLEX MICROSCOPIC      Result Value Range   Color, Urine AMBER (*) YELLOW   APPearance CLOUDY (*) CLEAR   Specific Gravity, Urine 1.031 (*) 1.005 - 1.030   pH 7.0  5.0 - 8.0   Glucose, UA NEGATIVE  NEGATIVE mg/dL   Hgb urine dipstick MODERATE (*) NEGATIVE   Bilirubin Urine SMALL (*) NEGATIVE   Ketones, ur 40 (*) NEGATIVE mg/dL   Protein, ur NEGATIVE  NEGATIVE mg/dL   Urobilinogen, UA 1.0  0.0 - 1.0 mg/dL   Nitrite NEGATIVE  NEGATIVE   Leukocytes, UA TRACE (*) NEGATIVE  URINE MICROSCOPIC-ADD ON      Result Value Range   Squamous Epithelial / LPF FEW (*) RARE   WBC, UA 0-2  <3 WBC/hpf   Bacteria, UA RARE  RARE   Urine-Other MUCOUS PRESENT    POCT PREGNANCY, URINE      Result Value Range   Preg Test, Ur NEGATIVE  NEGATIVE   Ct Abdomen Pelvis W Contrast  04/08/2013  *RADIOLOGY REPORT*  Clinical Data: Abdominal pain.  Nausea.  CT ABDOMEN AND PELVIS WITH CONTRAST  Technique:  Multidetector CT imaging of the abdomen and pelvis was performed following the standard protocol during bolus administration of intravenous contrast.  Contrast: 50mL OMNIPAQUE IOHEXOL 300 MG/ML  SOLN, 80mL OMNIPAQUE IOHEXOL 300 MG/ML  SOLN  Comparison: 03/10/2012  Findings: The heart is mildly enlarged.  Scarring in the lung bases, stable since prior study.  No pleural effusion. Postsurgical changes from posterior fusion in the thoracic and upper lumbar spine with severe scoliosis.  Liver, gallbladder, stomach, spleen, pancreas and adrenals unremarkable.  Crossed fused renal ectopia again noted.  No hydronephrosis or stones.  Uterus, right ovary and urinary bladder unremarkable.  4.8 cm cyst in the left ovary.  Moderate to large stool burden throughout the colon.  Small bowel is decompressed.  Appendix is visualized and is normal.  No free  fluid, free air or adenopathy.  No acute bony abnormality.  IVC filter in place, stable.  The legs extend through the wall of the IVC. Chronic decubitus ulcers overlying the ischial tuberosity bilaterally, stable.  IMPRESSION: 4.8 cm left ovarian cyst.  Stable chronic bilateral issue of decubitus ulcers.  Moderate to large stool burden throughout the colon.   Original Report Authenticated By: Charlett Nose, M.D.     A 40 5 PM patient feels improved after treatment with intravenous opioids, protonix and Zofran. Feels ready to home MDM  Symptoms likely secondary to constipation and GERD Plan encourage oral fluids, MiraLax, Colace, increase Protonix to 40 mg twice daily. Followup with PMD in [place] as needed Diagnosis abdominal pain        Doug Sou, MD 04/08/13 2056

## 2013-07-02 ENCOUNTER — Emergency Department (HOSPITAL_COMMUNITY): Payer: Medicare Other

## 2013-07-02 ENCOUNTER — Encounter (HOSPITAL_COMMUNITY): Payer: Self-pay | Admitting: Emergency Medicine

## 2013-07-02 ENCOUNTER — Emergency Department (HOSPITAL_COMMUNITY)
Admission: EM | Admit: 2013-07-02 | Discharge: 2013-07-03 | Disposition: A | Discharge status: Home / Self Care | Payer: Medicare Other | Attending: Emergency Medicine | Admitting: Emergency Medicine

## 2013-07-02 DIAGNOSIS — Z87828 Personal history of other (healed) physical injury and trauma: Secondary | ICD-10-CM | POA: Insufficient documentation

## 2013-07-02 DIAGNOSIS — J45909 Unspecified asthma, uncomplicated: Secondary | ICD-10-CM | POA: Insufficient documentation

## 2013-07-02 DIAGNOSIS — Z87442 Personal history of urinary calculi: Secondary | ICD-10-CM | POA: Insufficient documentation

## 2013-07-02 DIAGNOSIS — S99919A Unspecified injury of unspecified ankle, initial encounter: Secondary | ICD-10-CM | POA: Insufficient documentation

## 2013-07-02 DIAGNOSIS — Z79899 Other long term (current) drug therapy: Secondary | ICD-10-CM | POA: Insufficient documentation

## 2013-07-02 DIAGNOSIS — Z87440 Personal history of urinary (tract) infections: Secondary | ICD-10-CM | POA: Insufficient documentation

## 2013-07-02 DIAGNOSIS — IMO0002 Reserved for concepts with insufficient information to code with codable children: Secondary | ICD-10-CM | POA: Insufficient documentation

## 2013-07-02 DIAGNOSIS — X500XXA Overexertion from strenuous movement or load, initial encounter: Secondary | ICD-10-CM | POA: Insufficient documentation

## 2013-07-02 DIAGNOSIS — S76012A Strain of muscle, fascia and tendon of left hip, initial encounter: Secondary | ICD-10-CM

## 2013-07-02 DIAGNOSIS — Z87448 Personal history of other diseases of urinary system: Secondary | ICD-10-CM | POA: Insufficient documentation

## 2013-07-02 DIAGNOSIS — Z8739 Personal history of other diseases of the musculoskeletal system and connective tissue: Secondary | ICD-10-CM | POA: Insufficient documentation

## 2013-07-02 DIAGNOSIS — Y9389 Activity, other specified: Secondary | ICD-10-CM | POA: Insufficient documentation

## 2013-07-02 DIAGNOSIS — Y929 Unspecified place or not applicable: Secondary | ICD-10-CM | POA: Insufficient documentation

## 2013-07-02 DIAGNOSIS — M25552 Pain in left hip: Secondary | ICD-10-CM

## 2013-07-02 DIAGNOSIS — S8990XA Unspecified injury of unspecified lower leg, initial encounter: Secondary | ICD-10-CM | POA: Insufficient documentation

## 2013-07-02 MED ORDER — DIAZEPAM 5 MG/ML IJ SOLN
5.0000 mg | Freq: Once | INTRAMUSCULAR | Status: AC
  Administered 2013-07-02: 5 mg via INTRAVENOUS
  Filled 2013-07-02: qty 2

## 2013-07-02 MED ORDER — SODIUM CHLORIDE 0.9 % IV BOLUS (SEPSIS)
1000.0000 mL | Freq: Once | INTRAVENOUS | Status: AC
  Administered 2013-07-02: 1000 mL via INTRAVENOUS

## 2013-07-02 MED ORDER — HYDROMORPHONE HCL PF 2 MG/ML IJ SOLN
2.0000 mg | Freq: Once | INTRAMUSCULAR | Status: AC
  Administered 2013-07-02: 2 mg via INTRAVENOUS
  Filled 2013-07-02: qty 1

## 2013-07-02 MED ORDER — ONDANSETRON HCL 4 MG/2ML IJ SOLN
4.0000 mg | Freq: Once | INTRAMUSCULAR | Status: AC
  Administered 2013-07-02: 4 mg via INTRAVENOUS
  Filled 2013-07-02: qty 2

## 2013-07-02 MED ORDER — DIAZEPAM 5 MG PO TABS: 5.0000 mg | ORAL_TABLET | Freq: Four times a day (QID) | ORAL | Status: DC | PRN

## 2013-07-02 MED ORDER — IBUPROFEN 800 MG PO TABS: 800.0000 mg | ORAL_TABLET | Freq: Three times a day (TID) | ORAL | Status: DC

## 2013-07-02 NOTE — ED Provider Notes (Signed)
Medical screening examination/treatment/procedure(s) were performed by non-physician practitioner and as supervising physician I was immediately available for consultation/collaboration.   Mariselda Badalamenti L Purvi Ruehl, MD 07/02/13 2331 

## 2013-07-02 NOTE — ED Provider Notes (Signed)
History    CSN: [id number] Arrival date & time 07/02/13  1928  First MD Initiated Contact with Patient 07/02/13 1934     Chief Complaint  Patient presents with  . Leg Pain  . Hip Pain   (Consider location/radiation/quality/duration/timing/severity/associated sxs/prior Treatment) The history is provided by the patient and medical records. No language interpreter was used.    [name] is a 27 y.o. female  with a hx of spinal cord injury (C4-C5), autonomic dysreflexia, hypotension presents to the Emergency Department complaining of gradual, persistent, progressively worsening L hip pain onset acutely, 1 week while she was attempting to roll over in the bed. Pt states she has sensation to the lower extremities but is unable to walk.  Pt takes dilaudid 4mg  tabs at home for pain and has also tried ice and head which are not helping at all.  Pt states she takes the PO dilaudid for her sciatica, but this pain is completely different.  Pt is unable to sit up 2/2 pain at the hip joint.  Pt has a IVC filter to prevent migration of any possible blood clots but has not ever had a blood clot.  Associated symptoms include pain radiation down the L leg.  Pt denies fevers, chills, headache, neck pain, chest pain, abd pain, vomiting diarrhea, dysuria.      Past Medical History  Diagnosis Date  . Spinal cord injury, cervical region C4-C5  . Asthma Mild, only with exposure to cigarette smoke  . Sciatica   . Recurrent UTI requires self catheterization  . Abnormal Pap smear   . ASCUS (atypical squamous cells of undetermined significance) on Pap smear 10/2011  . Hx: UTI (urinary tract infection) 11/27/11  . H/O scoliosis   . Urinary tract infection   . Horseshoe kidney   . Congenital absence of right kidney   . Recurrent nephrolithiasis     s/p stent   Past Surgical History  Procedure Laterality Date  . Back surgery  Scoliosis, Harrington Rods in place  . Orthopedic surgery  Multiple lower  extremity surgeries as a child  . Lithotripsy    . Vascular surgery  IVC filter placed in 05-16-04   Family History  Problem Relation Age of Onset  . Heart disease Mother     Massive MI in 2005-05-16 ; died   History  Substance Use Topics  . Smoking status: Never Smoker   . Smokeless tobacco: Never Used  . Alcohol Use: No   OB History   Grav Para Term Preterm Abortions TAB SAB Ect Mult Living   0              Review of Systems  Constitutional: Negative for fever, diaphoresis, appetite change, fatigue and unexpected weight change.  HENT: Negative for mouth sores and neck stiffness.   Eyes: Negative for visual disturbance.  Respiratory: Negative for cough, chest tightness, shortness of breath and wheezing.   Cardiovascular: Negative for chest pain.  Gastrointestinal: Negative for nausea, vomiting, abdominal pain, diarrhea and constipation.  Endocrine: Negative for polydipsia, polyphagia and polyuria.  Genitourinary: Negative for dysuria, urgency, frequency and hematuria.  Musculoskeletal: Positive for arthralgias and gait problem (at baseline). Negative for back pain (L hip).  Skin: Negative for rash.  Allergic/Immunologic: Negative for immunocompromised state.  Neurological: Negative for syncope, light-headedness and headaches.  Hematological: Does not bruise/bleed easily.  Psychiatric/Behavioral: Negative for sleep disturbance. The patient is not nervous/anxious.     Allergies  Bactrim; Lactose intolerance (gi); Metoclopramide hcl; and  Benadryl  Home Medications   Current Outpatient Rx  Name  Route  Sig  Dispense  Refill  . docusate sodium (COLACE) 100 MG capsule   Oral   Take 100 mg by mouth 2 (two) times daily as needed for constipation.          Marland Kitchen HYDROmorphone (DILAUDID) 4 MG tablet   Oral   Take 4 mg by mouth 2 (two) times daily as needed for pain. pain         . nitrofurantoin (MACRODANTIN) 100 MG capsule   Oral   Take 100 mg by mouth at bedtime.          .  pantoprazole (PROTONIX) 40 MG tablet   Oral   Take 40 mg by mouth every morning.          . promethazine (PHENERGAN) 25 MG tablet   Oral   Take 25 mg by mouth every 8 (eight) hours as needed for nausea.         . solifenacin (VESICARE) 10 MG tablet   Oral   Take 10 mg by mouth every morning.          . zolpidem (AMBIEN) 10 MG tablet   Oral   Take 10 mg by mouth at bedtime.          Marland Kitchen albuterol (PROVENTIL HFA;VENTOLIN HFA) 108 (90 BASE) MCG/ACT inhaler   Inhalation   Inhale 2 puffs into the lungs every 6 (six) hours as needed for wheezing. For wheezing         . diazepam (VALIUM) 5 MG tablet   Oral   Take 1 tablet (5 mg total) by mouth every 6 (six) hours as needed (for muscle spasms).   15 tablet   0   . ibuprofen (ADVIL,MOTRIN) 800 MG tablet   Oral   Take 1 tablet (800 mg total) by mouth 3 (three) times daily.   21 tablet   0    BP 88/43  Pulse 64  Temp(Src) 98.4 F (36.9 C) (Oral)  Resp 12  SpO2 99%  LMP 06/02/2013 Physical Exam  Nursing note and vitals reviewed. Constitutional: She appears well-developed and well-nourished. No distress.  HENT:  Head: Normocephalic and atraumatic.  Mouth/Throat: Oropharynx is clear and moist. No oropharyngeal exudate.  Eyes: Conjunctivae are normal. Pupils are equal, round, and reactive to light.  Neck: Normal range of motion. Neck supple.  Full ROM without pain  Cardiovascular: Normal rate, regular rhythm, S1 normal, normal heart sounds and intact distal pulses.   No murmur heard. Pulses:      Radial pulses are 2+ on the right side, and 2+ on the left side.       Dorsalis pedis pulses are 2+ on the right side, and 2+ on the left side.       Posterior tibial pulses are 2+ on the right side, and 2+ on the left side.  Capillary refill < 3 sec  Pulmonary/Chest: Effort normal and breath sounds normal. No respiratory distress. She has no wheezes. She has no rales.  Abdominal: Soft. She exhibits no distension. There is  no tenderness.  Musculoskeletal: She exhibits tenderness. She exhibits no edema.       Left hip: She exhibits decreased strength (at baseline) and tenderness. She exhibits no swelling, no crepitus, no deformity and no laceration.       Legs: No tenderness to palpation of the spinous processes of the T-spine or L-spine No tenderness to palpation of the paraspinous muscles  of the L-spine No swelling of the lower extremities, negative Homans sign, no tenderness bilateral calves, no palpable cord  Lymphadenopathy:    She has no cervical adenopathy.  Neurological: She is alert. She has normal reflexes. She displays atrophy. Coordination normal. GCS eye subscore is 4. GCS verbal subscore is 5. GCS motor subscore is 6.  Speech is clear and goal oriented, follows commands Pt with atrophy and decreased strength to all 4 extremities at baseline.   Pt does not ambulate at baseline but has decreased ROM from baseline to the L hip 2/2 pain  Skin: Skin is warm and dry. No rash noted. She is not diaphoretic. No erythema.  No tenting of the skin NO ecchymosis, erythema or induration Large sacral ulcer, well healing and without evidence of infection  Psychiatric: She has a normal mood and affect.    ED Course  Procedures (including critical care time) Labs Reviewed - No data to display Dg Hip Complete Left  07/02/2013   *RADIOLOGY REPORT*  Clinical Data: Left hip pain.  No known injury.  LEFT HIP - COMPLETE 2+ VIEW  Comparison: None.  Findings: Diffuse osteopenia.  Malformed pelvic bones with no fracture or dislocation seen.  Partially included inferior vena cava filter.  IMPRESSION: No fracture or dislocation.   Original Report Authenticated By: Beckie Salts, M.D.   1. Hip pain, acute, left   2. Hip strain, left, initial encounter     MDM  Elie Confer presents with Hx and PE consistent with quadraceps strain vs IT band inflammation.  No trauma to indicate hip fracture, but will obtain x-ray to r/o  AVN.    Patient X-Ray negative for obvious fracture or dislocation. I personally reviewed the imaging tests through PACS system.  I reviewed available ER/hospitalization records through the EMR.  Pain managed in ED. Pt advised to follow up with orthopedics if symptoms persist for further evaluation. Conservative therapy recommended and discussed. Patient will be dc home & is agreeable with above plan.   Dahlia Client Nadira Single, PA-C 07/02/13 2322

## 2013-07-02 NOTE — ED Notes (Signed)
Pt arrived to the ED with a chief complaint of pain in her leg and hip.  Pt is a spinal cord injury and has rehabilitated herself to a position of being able to adjust herself in bed.  Pt is not ambulatory.  Last week pt moved and felt a pop in her left hip and has had pain associated with this hip since.

## 2013-07-02 NOTE — ED Notes (Signed)
ZOX:[id number]<AV> Expected date:07/02/13<BR> Expected time: 7:24 PM<BR> Means of arrival:Ambulance<BR> Comments:<BR> Hip and back pain

## 2013-10-11 ENCOUNTER — Emergency Department (HOSPITAL_COMMUNITY): Payer: Medicare Other

## 2013-10-11 ENCOUNTER — Emergency Department (HOSPITAL_COMMUNITY)
Admission: EM | Admit: 2013-10-11 | Discharge: 2013-10-11 | Disposition: A | Discharge status: Home / Self Care | Payer: Medicare Other | Attending: Emergency Medicine | Admitting: Emergency Medicine

## 2013-10-11 ENCOUNTER — Encounter (HOSPITAL_COMMUNITY): Payer: Self-pay | Admitting: Emergency Medicine

## 2013-10-11 DIAGNOSIS — R112 Nausea with vomiting, unspecified: Secondary | ICD-10-CM | POA: Insufficient documentation

## 2013-10-11 DIAGNOSIS — R51 Headache: Secondary | ICD-10-CM | POA: Insufficient documentation

## 2013-10-11 DIAGNOSIS — Y929 Unspecified place or not applicable: Secondary | ICD-10-CM | POA: Insufficient documentation

## 2013-10-11 DIAGNOSIS — Z87442 Personal history of urinary calculi: Secondary | ICD-10-CM | POA: Insufficient documentation

## 2013-10-11 DIAGNOSIS — Z87828 Personal history of other (healed) physical injury and trauma: Secondary | ICD-10-CM | POA: Insufficient documentation

## 2013-10-11 DIAGNOSIS — L89109 Pressure ulcer of unspecified part of back, unspecified stage: Secondary | ICD-10-CM | POA: Insufficient documentation

## 2013-10-11 DIAGNOSIS — Z8739 Personal history of other diseases of the musculoskeletal system and connective tissue: Secondary | ICD-10-CM | POA: Insufficient documentation

## 2013-10-11 DIAGNOSIS — Z87440 Personal history of urinary (tract) infections: Secondary | ICD-10-CM | POA: Insufficient documentation

## 2013-10-11 DIAGNOSIS — Z79899 Other long term (current) drug therapy: Secondary | ICD-10-CM | POA: Insufficient documentation

## 2013-10-11 DIAGNOSIS — Q638 Other specified congenital malformations of kidney: Secondary | ICD-10-CM | POA: Insufficient documentation

## 2013-10-11 DIAGNOSIS — W050XXA Fall from non-moving wheelchair, initial encounter: Secondary | ICD-10-CM | POA: Insufficient documentation

## 2013-10-11 DIAGNOSIS — Z8742 Personal history of other diseases of the female genital tract: Secondary | ICD-10-CM | POA: Insufficient documentation

## 2013-10-11 DIAGNOSIS — Q605 Renal hypoplasia, unspecified: Secondary | ICD-10-CM | POA: Insufficient documentation

## 2013-10-11 DIAGNOSIS — Q602 Renal agenesis, unspecified: Secondary | ICD-10-CM | POA: Insufficient documentation

## 2013-10-11 DIAGNOSIS — M6281 Muscle weakness (generalized): Secondary | ICD-10-CM | POA: Insufficient documentation

## 2013-10-11 DIAGNOSIS — Y939 Activity, unspecified: Secondary | ICD-10-CM | POA: Insufficient documentation

## 2013-10-11 DIAGNOSIS — Z881 Allergy status to other antibiotic agents status: Secondary | ICD-10-CM | POA: Insufficient documentation

## 2013-10-11 DIAGNOSIS — Z888 Allergy status to other drugs, medicaments and biological substances status: Secondary | ICD-10-CM | POA: Insufficient documentation

## 2013-10-11 DIAGNOSIS — W19XXXA Unspecified fall, initial encounter: Secondary | ICD-10-CM

## 2013-10-11 DIAGNOSIS — R339 Retention of urine, unspecified: Secondary | ICD-10-CM | POA: Insufficient documentation

## 2013-10-11 DIAGNOSIS — S0990XA Unspecified injury of head, initial encounter: Secondary | ICD-10-CM

## 2013-10-11 DIAGNOSIS — S139XXA Sprain of joints and ligaments of unspecified parts of neck, initial encounter: Secondary | ICD-10-CM | POA: Insufficient documentation

## 2013-10-11 DIAGNOSIS — Z993 Dependence on wheelchair: Secondary | ICD-10-CM | POA: Insufficient documentation

## 2013-10-11 DIAGNOSIS — J45909 Unspecified asthma, uncomplicated: Secondary | ICD-10-CM | POA: Insufficient documentation

## 2013-10-11 MED ORDER — HYDROMORPHONE HCL PF 1 MG/ML IJ SOLN
1.0000 mg | Freq: Once | INTRAMUSCULAR | Status: AC
  Administered 2013-10-11: 1 mg via INTRAVENOUS
  Filled 2013-10-11: qty 1

## 2013-10-11 MED ORDER — ONDANSETRON HCL 4 MG/2ML IJ SOLN
4.0000 mg | Freq: Once | INTRAMUSCULAR | Status: AC
  Administered 2013-10-11: 4 mg via INTRAVENOUS
  Filled 2013-10-11: qty 2

## 2013-10-11 MED ORDER — SODIUM CHLORIDE 0.9 % IV SOLN
1.0000 mg/h | INTRAVENOUS | Status: DC
  Filled 2013-10-11: qty 5

## 2013-10-11 NOTE — ED Notes (Signed)
Received report from off going RN and introduced self to the pt. 

## 2013-10-11 NOTE — ED Provider Notes (Signed)
CSN: [id number]     Arrival date & time 10/11/13  1840 History   First MD Initiated Contact with Patient 10/11/13 1844     Chief Complaint  Patient presents with  . Fall   (Consider location/radiation/quality/duration/timing/severity/associated sxs/prior Treatment) Patient is a 27 y.o. female presenting with fall.  Fall This is a new problem. The current episode started today. Episode frequency: once. The problem has been unchanged. Associated symptoms include headaches, nausea, vomiting and weakness (chronically in bil le). Pertinent negatives include no abdominal pain, chest pain, chills, congestion, coughing, fever, numbness, rash or sore throat. Nothing aggravates the symptoms. She has tried nothing for the symptoms.    Past Medical History  Diagnosis Date  . Spinal cord injury, cervical region C4-C5  . Asthma Mild, only with exposure to cigarette smoke  . Sciatica   . Recurrent UTI requires self catheterization  . Abnormal Pap smear   . ASCUS (atypical squamous cells of undetermined significance) on Pap smear 10/2011  . Hx: UTI (urinary tract infection) 11/27/11  . H/O scoliosis   . Urinary tract infection   . Horseshoe kidney   . Congenital absence of right kidney   . Recurrent nephrolithiasis     s/p stent   Past Surgical History  Procedure Laterality Date  . Back surgery  Scoliosis, Harrington Rods in place  . Orthopedic surgery  Multiple lower extremity surgeries as a child  . Lithotripsy    . Vascular surgery  IVC filter placed in 05/10/2004   Family History  Problem Relation Age of Onset  . Heart disease Mother     Massive MI in 05/10/05 ; died   History  Substance Use Topics  . Smoking status: Never Smoker   . Smokeless tobacco: Never Used  . Alcohol Use: No   OB History   Grav Para Term Preterm Abortions TAB SAB Ect Mult Living   0              Review of Systems  Constitutional: Negative for fever and chills.  HENT: Negative for congestion, rhinorrhea and sore  throat.   Eyes: Negative for photophobia and visual disturbance.  Respiratory: Negative for cough and shortness of breath.   Cardiovascular: Negative for chest pain and leg swelling.  Gastrointestinal: Positive for nausea and vomiting. Negative for abdominal pain, diarrhea and constipation.  Endocrine: Negative for polyphagia and polyuria.  Genitourinary: Negative for dysuria, flank pain, vaginal bleeding, vaginal discharge and enuresis.  Musculoskeletal: Negative for back pain and gait problem.  Skin: Negative for color change and rash.  Neurological: Positive for weakness (chronically in bil le) and headaches. Negative for dizziness, syncope, light-headedness and numbness.  Hematological: Negative for adenopathy. Does not bruise/bleed easily.  All other systems reviewed and are negative.    Allergies  Bactrim; Lactose intolerance (gi); Metoclopramide hcl; and Benadryl  Home Medications   Current Outpatient Rx  Name  Route  Sig  Dispense  Refill  . diazepam (VALIUM) 5 MG tablet   Oral   Take 1 tablet (5 mg total) by mouth every 6 (six) hours as needed (for muscle spasms).   15 tablet   0   . HYDROmorphone (DILAUDID) 4 MG tablet   Oral   Take 4 mg by mouth 2 (two) times daily as needed for pain. pain         . ibuprofen (ADVIL,MOTRIN) 800 MG tablet   Oral   Take 1 tablet (800 mg total) by mouth 3 (three) times daily.  21 tablet   0   . nitrofurantoin (MACRODANTIN) 100 MG capsule   Oral   Take 100 mg by mouth at bedtime.          . pantoprazole (PROTONIX) 40 MG tablet   Oral   Take 40 mg by mouth every morning.          . promethazine (PHENERGAN) 25 MG tablet   Oral   Take 25 mg by mouth every 8 (eight) hours as needed for nausea.         . solifenacin (VESICARE) 10 MG tablet   Oral   Take 10 mg by mouth every morning.          . zolpidem (AMBIEN) 10 MG tablet   Oral   Take 10 mg by mouth at bedtime.          Marland Kitchen albuterol (PROVENTIL HFA;VENTOLIN  HFA) 108 (90 BASE) MCG/ACT inhaler   Inhalation   Inhale 2 puffs into the lungs every 6 (six) hours as needed for wheezing. For wheezing          BP 130/88  Pulse 54  Temp(Src) 97.8 F (36.6 C) (Oral)  Resp 20  Ht 4\' 8"  (1.422 m)  Wt 100 lb (45.36 kg)  BMI 22.43 kg/m2  SpO2 97%  LMP 10/04/2013 Physical Exam  Vitals reviewed. Constitutional: She is oriented to person, place, and time. She appears well-developed and well-nourished.  HENT:  Head: Normocephalic and atraumatic.  Right Ear: External ear normal.  Left Ear: External ear normal.  Eyes: Conjunctivae and EOM are normal. Pupils are equal, round, and reactive to light.  Neck: Normal range of motion. Neck supple.  Cardiovascular: Normal rate, regular rhythm, normal heart sounds and intact distal pulses.   Pulmonary/Chest: Effort normal and breath sounds normal.  Abdominal: Soft. Bowel sounds are normal. There is no tenderness.  Musculoskeletal: Normal range of motion.       Cervical back: She exhibits tenderness.       Thoracic back: She exhibits no tenderness and no bony tenderness.       Lumbar back: She exhibits no tenderness and no bony tenderness.  Decub ulcers to bil ischial area without erythema, purulence, or other signs of infection  Neurological: She is alert and oriented to person, place, and time.  Strength 3/5 bil le from cervical injury  Skin: Skin is warm and dry.    ED Course  Procedures (including critical care time) Labs Review Labs Reviewed - No data to display Imaging Review Ct Head Wo Contrast  10/11/2013   CLINICAL DATA:  Fall  EXAM: CT HEAD WITHOUT CONTRAST  CT CERVICAL SPINE WITHOUT CONTRAST  TECHNIQUE: Multidetector CT imaging of the head and cervical spine was performed following the standard protocol without intravenous contrast. Multiplanar CT image reconstructions of the cervical spine were also generated.  COMPARISON:  CT head 06/06/2011  FINDINGS: CT HEAD FINDINGS  Ventricle size is  normal. Negative for intracranial hemorrhage. Negative for infarct or mass. Negative for skull fracture.  CT CERVICAL SPINE FINDINGS  Negative for acute fracture. Normal alignment.  ACDF with anterior fusion at C4-5 and C5-6. Posterior hardware fusion C4 through C6 bilaterally.  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 Additional vertebral body anomalies are noted. There is fusion of C7, T1 T2 and T3 anteriorly and in the posterior elements. Posterior Harrington rods are present. Diffuse posterior elements fusion .  IMPRESSION: No acute intracranial abnormality.  Negative for cervical spine fracture.   Electronically Signed   By: Marlan Palau M.D.   On: 10/11/2013 20:20   Ct Cervical Spine Wo Contrast  10/11/2013   CLINICAL DATA:  Fall  EXAM: CT HEAD WITHOUT CONTRAST  CT CERVICAL SPINE WITHOUT CONTRAST  TECHNIQUE: Multidetector CT imaging of the head and cervical spine was performed following the standard protocol without intravenous contrast. Multiplanar CT image reconstructions of the cervical spine were also generated.  COMPARISON:  CT head 06/06/2011  FINDINGS: CT HEAD FINDINGS  Ventricle size is normal. Negative for intracranial hemorrhage. Negative for infarct or mass. Negative for skull fracture.  CT CERVICAL SPINE FINDINGS  Negative for acute fracture. Normal alignment.  ACDF with anterior fusion at C4-5 and C5-6. Posterior hardware fusion C4 through C6 bilaterally.  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 Additional vertebral body anomalies are noted. There is fusion of C7, T1 T2 and T3 anteriorly and in the posterior elements. Posterior Harrington rods are present. Diffuse posterior elements fusion .  IMPRESSION: No acute intracranial abnormality.  Negative for cervical spine fracture.   Electronically Signed   By: Marlan Palau M.D.   On: 10/11/2013 20:20    EKG  Interpretation   None       MDM   1. Fall, initial encounter   2. Closed head injury, initial encounter   3. Neck sprain, initial encounter    27 y.o. female  with pertinent PMH of c4-5 spinal cord injury with residual bil le weakness presents with headache, nausea, vomiting after fall shortly prior to arrival. Patient was seated in her wheelchair and after a dog hit the wheelchair the patient fell backwards and landed on her head. She denies LOC, however since that time has had a headache, nausea, vomiting, and neck pain. She denies any new neurologic weakness or sensory loss. She has chronic urinary retention for which she self catheterizes, urine obtained today by in and out cath. Physical exam as above without thoracic or lumbar spinal tenderness, however patient does have cervical spinal tenderness.  Also no abdominal tenderness, other signs of extremity injury. Obtain CT imaging as above which was unremarkable. Likely etiology of patient's symptoms closed head injury. He should get in standard return precautions voiced understanding and agreed to followup.      Labs and imaging as above reviewed by myself and attending,Dr. Juleen China, with whom case was discussed.   1. Fall, initial encounter   2. Closed head injury, initial encounter   3. Neck sprain, initial encounter         Noel Gerold, MD 10/11/13 2129

## 2013-10-11 NOTE — ED Notes (Signed)
Patient transported to CT 

## 2013-10-11 NOTE — ED Notes (Addendum)
Pt arrives via [facility] from home, EMS reports that pt flipped out of her wheelchair at 1600 today while outside, ems reports pt hit the back of her head on pavement. Pt husband picked pt up and brought her back in house. Upone arrival pt was complaining of numbness and tingling in left arm. Pt has previous spinal cord injurs x9 years ago that caused pt to have trouble moving the legs and wheelchair bound. Pt also has pins  In c3-c6, and rods in both shoulders to rib cage. Pt also has to cath herself every day in order to void. Pt pupils were equal and reactive, pt denies another symptoms just complaining of pain. Pt reports that dilaudid is the only medicine that helps with pain. C-spine cleared at arrival by RN staff. Pt is axo X4 and nad noted

## 2013-10-12 NOTE — ED Provider Notes (Signed)
Medical screening examination/treatment/procedure(s) were performed by non-physician practitioner and as supervising physician I was immediately available for consultation/collaboration.  EKG Interpretation   None      27 year old female presenting after a fall. She struck the back of her head. Complaining of headache and neck pain. No loss of consciousness. Patient with a past history of cervical spine injury. She is at her neurological baseline. Imaging of the head and C-spine did not show any acute abnormality.   Raeford Razor, MD 10/12/13 Perlie Mayo

## 2015-04-09 ENCOUNTER — Encounter (HOSPITAL_BASED_OUTPATIENT_CLINIC_OR_DEPARTMENT_OTHER): Payer: Medicare Other

## 2015-04-27 ENCOUNTER — Emergency Department (INDEPENDENT_AMBULATORY_CARE_PROVIDER_SITE_OTHER)
Admission: EM | Admit: 2015-04-27 | Discharge: 2015-04-27 | Disposition: A | Discharge status: Home / Self Care | Payer: Medicare Other | Source: Home / Self Care | Attending: Family Medicine | Admitting: Family Medicine

## 2015-04-27 ENCOUNTER — Encounter (HOSPITAL_COMMUNITY): Payer: Self-pay | Admitting: Emergency Medicine

## 2015-04-27 DIAGNOSIS — N390 Urinary tract infection, site not specified: Secondary | ICD-10-CM | POA: Diagnosis not present

## 2015-04-27 MED ORDER — DOXYCYCLINE HYCLATE 100 MG PO CAPS: 100.0000 mg | ORAL_CAPSULE | Freq: Two times a day (BID) | ORAL | Status: DC

## 2015-04-27 NOTE — ED Notes (Signed)
C/o UTI sx onset 3 weeks Sx include urinary freq/urgency, abd pain, cloudy Denies fevers, hematuria Alert, no signs of acute disterss

## 2015-04-27 NOTE — ED Provider Notes (Addendum)
CSN: [id number]     Arrival date & time 04/27/15  1534 History   First MD Initiated Contact with Patient 04/27/15 1726     Chief Complaint  Patient presents with  . Urinary Tract Infection   (Consider location/radiation/quality/duration/timing/severity/associated sxs/prior Treatment) HPI Comments:  29 year old female who has several chronic and debilitating problems including spinal cord injury, asthma, sciatica, recurrent UTIs, scoliosis, chronic pain, congenital absence of the right kidney, recurrent nephrolithiasis presents with "a UTI". She states that she has to be cathed several times a day by her husband. She denies dysuria, states the urine is cloudy and she has pelvic pain and urinary frequency.    Past Medical History  Diagnosis Date  . Spinal cord injury, cervical region C4-C5  . Asthma Mild, only with exposure to cigarette smoke  . Sciatica   . Recurrent UTI requires self catheterization  . Abnormal Pap smear   . ASCUS (atypical squamous cells of undetermined significance) on Pap smear 10/2011  . Hx: UTI (urinary tract infection) 11/27/11  . H/O scoliosis   . Urinary tract infection   . Horseshoe kidney   . Congenital absence of right kidney   . Recurrent nephrolithiasis     s/p stent   Past Surgical History  Procedure Laterality Date  . Back surgery  Scoliosis, Harrington Rods in place  . Orthopedic surgery  Multiple lower extremity surgeries as a child  . Lithotripsy    . Vascular surgery  IVC filter placed in 2005   Family History  Problem Relation Age of Onset  . Heart disease Mother     Massive MI in 2006 ; died   History  Substance Use Topics  . Smoking status: Never Smoker   . Smokeless tobacco: Never Used  . Alcohol Use: No   OB History    Gravida Para Term Preterm AB TAB SAB Ectopic Multiple Living   0              Review of Systems  Constitutional: Positive for fatigue. Negative for fever.  HENT: Negative.   Respiratory: Negative.    Cardiovascular: Negative for chest pain.  Gastrointestinal: Negative for abdominal pain.  Genitourinary: Positive for frequency and pelvic pain. Negative for dysuria, flank pain, vaginal bleeding and vaginal discharge.  Musculoskeletal: Positive for back pain.    Allergies  Bactrim; Lactose intolerance (gi); Metoclopramide hcl; and Benadryl  Home Medications   Prior to Admission medications   Medication Sig Start Date End Date Taking? Authorizing Provider  cyclobenzaprine (FLEXERIL) 10 MG tablet Take 10 mg by mouth 3 (three) times daily as needed for muscle spasms.   Yes Historical Provider, MD  diazepam (VALIUM) 5 MG tablet Take 1 tablet (5 mg total) by mouth every 6 (six) hours as needed (for muscle spasms). 07/02/13  Yes Hannah Muthersbaugh, PA-C  HYDROmorphone (DILAUDID) 4 MG tablet Take 4 mg by mouth 2 (two) times daily as needed for pain. pain   Yes Historical Provider, MD  ibuprofen (ADVIL,MOTRIN) 800 MG tablet Take 1 tablet (800 mg total) by mouth 3 (three) times daily. 07/02/13  Yes Hannah Muthersbaugh, PA-C  nitrofurantoin (MACRODANTIN) 100 MG capsule Take 100 mg by mouth at bedtime.    Yes Historical Provider, MD  solifenacin (VESICARE) 10 MG tablet Take 10 mg by mouth every morning.    Yes Historical Provider, MD  zolpidem (AMBIEN) 10 MG tablet Take 10 mg by mouth at bedtime.    Yes Historical Provider, MD  albuterol (PROVENTIL HFA;VENTOLIN HFA) 108 (90  BASE) MCG/ACT inhaler Inhale 2 puffs into the lungs every 6 (six) hours as needed for wheezing. For wheezing    Historical Provider, MD  doxycycline (VIBRAMYCIN) 100 MG capsule Take 1 capsule (100 mg total) by mouth 2 (two) times daily. 04/27/15   Hayden Rasmussenavid Lezlee Gills, NP  pantoprazole (PROTONIX) 40 MG tablet Take 40 mg by mouth every morning.     Historical Provider, MD  promethazine (PHENERGAN) 25 MG tablet Take 25 mg by mouth every 8 (eight) hours as needed for nausea.    Historical Provider, MD   BP 116/80 mmHg  Pulse 132  Temp(Src) 98  F (36.7 C) (Oral)  Resp 18  SpO2 100%  LMP 04/06/2015 Physical Exam  Constitutional: She is oriented to person, place, and time. She appears well-developed and well-nourished. No distress.  Pulmonary/Chest: Effort normal. No respiratory distress.  Neurological: She is alert and oriented to person, place, and time.  Skin: Skin is warm and dry.  Psychiatric: She has a normal mood and affect.  Nursing note and vitals reviewed.   ED Course  Procedures (including critical care time) Labs Review Imaging Review No results found.  Urine MDM   1. UTI (lower urinary tract infection)    Patient is being treated with doxycycline. Patient has several medical maladies and one of which is urinary incontinence. She has to be cathed periodically. At one time she had several UTIs during particular year but this has slowed down apparently she has been on several antibiotics and according to the patient and her husband nothing has worked except for doxycycline the last few time she has had a UTI. She insists this is chronic problem and Doxy is the  the only one that she would take. Declines other ABX. Urine culture pending.    Hayden Rasmussenavid Adelfo Diebel, NP 04/27/15 1825  Hayden Rasmussenavid Beila Purdie, NP 04/30/15 2120

## 2015-04-27 NOTE — Discharge Instructions (Signed)

## 2015-04-30 LAB — URINE CULTURE

## 2015-05-07 ENCOUNTER — Encounter (HOSPITAL_BASED_OUTPATIENT_CLINIC_OR_DEPARTMENT_OTHER): Payer: Medicare Other

## 2015-05-10 ENCOUNTER — Telehealth (HOSPITAL_COMMUNITY): Payer: Self-pay | Admitting: *Deleted

## 2015-05-10 NOTE — ED Notes (Addendum)
Urine culture: >100,000 colonies Pseudomonas Aeruginosa. Pt. treated with Doxycycline but not on sensitivity.  Lab shown to Dr. Konrad DoloresMerrell.  He said call for clinical improvement.  If not better, pt. needs Cipro 500 mg. BID x 7 days. I called pt. Husband/caretaker verified x 2 and given results. Her husband said she was better while taking the Doxycycline but now feels worse again- still having pain in her lower pelvis and over her kidneys. I told him doxycycline is not on the sensitivity report.   It sounds like she needs the Cipro. [facility] to [facility] [address] [place].  Instructed that she needs to drink plenty of water and f/u with her PCP if not better after the medication.  He voiced understanding.  Rx. called to pharmacist @ [phone]. Vassie MoselleYork, Loriene Taunton M 05/10/2015

## 2015-06-11 ENCOUNTER — Encounter (HOSPITAL_BASED_OUTPATIENT_CLINIC_OR_DEPARTMENT_OTHER): Payer: Medicare Other | Attending: Plastic Surgery

## 2015-06-11 DIAGNOSIS — Q600 Renal agenesis, unilateral: Secondary | ICD-10-CM | POA: Insufficient documentation

## 2015-06-11 DIAGNOSIS — G8250 Quadriplegia, unspecified: Secondary | ICD-10-CM | POA: Diagnosis not present

## 2015-06-11 DIAGNOSIS — Z8619 Personal history of other infectious and parasitic diseases: Secondary | ICD-10-CM | POA: Diagnosis not present

## 2015-06-11 DIAGNOSIS — L89314 Pressure ulcer of right buttock, stage 4: Secondary | ICD-10-CM | POA: Insufficient documentation

## 2015-06-11 DIAGNOSIS — Z87440 Personal history of urinary (tract) infections: Secondary | ICD-10-CM | POA: Diagnosis not present

## 2015-06-11 DIAGNOSIS — L89324 Pressure ulcer of left buttock, stage 4: Secondary | ICD-10-CM | POA: Insufficient documentation

## 2015-06-11 DIAGNOSIS — L89322 Pressure ulcer of left buttock, stage 2: Secondary | ICD-10-CM | POA: Insufficient documentation

## 2015-07-09 ENCOUNTER — Encounter (HOSPITAL_BASED_OUTPATIENT_CLINIC_OR_DEPARTMENT_OTHER): Payer: Medicare Other | Attending: Plastic Surgery

## 2015-08-03 ENCOUNTER — Emergency Department (INDEPENDENT_AMBULATORY_CARE_PROVIDER_SITE_OTHER)
Admission: EM | Admit: 2015-08-03 | Discharge: 2015-08-03 | Disposition: A | Discharge status: Home / Self Care | Payer: Medicare Other | Source: Home / Self Care | Attending: Family Medicine | Admitting: Family Medicine

## 2015-08-03 ENCOUNTER — Encounter (HOSPITAL_COMMUNITY): Payer: Self-pay | Admitting: *Deleted

## 2015-08-03 DIAGNOSIS — N390 Urinary tract infection, site not specified: Secondary | ICD-10-CM | POA: Diagnosis not present

## 2015-08-03 LAB — POCT URINALYSIS DIP (DEVICE)
Bilirubin Urine: NEGATIVE
Glucose, UA: NEGATIVE mg/dL
KETONES UR: NEGATIVE mg/dL
Nitrite: POSITIVE — AB
Protein, ur: 100 mg/dL — AB
SPECIFIC GRAVITY, URINE: 1.02 (ref 1.005–1.030)
Urobilinogen, UA: 0.2 mg/dL (ref 0.0–1.0)
pH: 6.5 (ref 5.0–8.0)

## 2015-08-03 MED ORDER — DOXYCYCLINE HYCLATE 100 MG PO CAPS: 100.0000 mg | ORAL_CAPSULE | Freq: Two times a day (BID) | ORAL | Status: DC

## 2015-08-03 NOTE — Discharge Instructions (Signed)
Antibiotic Medication °Antibiotics are among the most frequently prescribed medicines. Antibiotics cure illness by assisting our body to injure or kill the bacteria that cause infection. While antibiotics are useful to treat a wide variety of infections they are useless against viruses. Antibiotics cannot cure colds, flu, or other viral infections.  °There are many types of antibiotics available. Your caregiver will decide which antibiotic will be useful for an illness. Never take or give someone else's antibiotics or left over medicine. °Your caregiver may also take into account: °· Allergies. °· The cost of the medicine. °· Dosing schedules. °· Taste. °· Common side effects when choosing an antibiotic for an infection. °Ask your caregiver if you have questions about why a certain medicine was chosen. °HOME CARE INSTRUCTIONS °Read all instructions and labels on medicine bottles carefully. Some antibiotics should be taken on an empty stomach while others should be taken with food. Taking antibiotics incorrectly may reduce how well they work. Some antibiotics need to be kept in the refrigerator. Others should be kept at room temperature. Ask your caregiver or pharmacist if you do not understand how to give the medicine. °Be sure to give the amount of medicine your caregiver has prescribed. Even if you feel better and your symptoms improve, bacteria may still remain alive in the body. Taking all of the medicine will prevent: °· The infection from returning and becoming harder to treat. °· Complications from partially treated infections. °If there is any medicine left over after you have taken the medicine as your caregiver has instructed, throw the medicine away. °Be sure to tell your caregiver if you: °· Are allergic to any medicines. °· Are pregnant or intend to become pregnant while using this medicine. °· Are breastfeeding. °· Are taking any other prescription, non-prescription medicine, or herbal  remedies. °· Have any other medical conditions or problems you have not already discussed. °If you are taking birth control pills, they may not work while you are on antibiotics. To avoid unwanted pregnancy: °· Continue taking your birth control pills as usual. °· Use a second form of birth control (such as condoms) while you are taking antibiotic medicine. °· When you finish taking the antibiotic medicine, continue using the second form of birth control until you are finished with your current 1 month cycle of birth control pills. °Try not to miss any doses of medicine. If you miss a dose, take it as soon as possible. However, if it is almost time for the next dose and the dosing schedule is: °· 2 doses a day, take the missed dose and the next dose 5 to 6 hours apart. °· 3 or more doses a day, take the missed dose and the next dose 2 to 4 hours apart, then go back to the normal schedule. °· If you are unable to make up a missed dose, take the next scheduled dose on time and complete the missed dose at the end of the prescribed time for your medicine. °SIDE EFFECTS TO TAKING ANTIBIOTICS °Common side effects to antibiotic use include: °· Soft stools or diarrhea. °· Mild stomach upset. °· Sun sensitivity. °SEEK MEDICAL CARE IF:  °· If you get worse or do not improve within a few days of starting the medicine. °· Vomiting develops. °· Diaper rash or rash on the genitals appears. °· Vaginal itching occurs. °· White patches appear on the tongue or in the mouth. °· Severe watery diarrhea and abdominal cramps occur. °· Signs of an allergy develop (hives, unknown   itchy rash appears). STOP TAKING THE ANTIBIOTIC. °SEEK IMMEDIATE MEDICAL CARE IF:  °· Urine turns dark or blood colored. °· Skin turns yellow. °· Easy bruising or bleeding occurs. °· Joint pain or muscle aches occur. °· Fever returns. °· Severe headache occurs. °· Signs of an allergy develop (trouble breathing, wheezing, swelling of the lips, face or tongue,  fainting, or blisters on the skin or in the mouth). STOP TAKING THE ANTIBIOTIC. °Document Released: 08/20/2004 Document Revised: 03/01/2012 Document Reviewed: 08/30/2009 °ExitCare® Patient Information ©2015 [employer]. This information is not intended to replace advice given to you by your health care provider. Make sure you discuss any questions you have with your health care provider. ° °Urinary Tract Infection °Urinary tract infections (UTIs) can develop anywhere along your urinary tract. Your urinary tract is your body's drainage system for removing wastes and extra water. Your urinary tract includes two kidneys, two ureters, a bladder, and a urethra. Your kidneys are a pair of bean-shaped organs. Each kidney is about the size of your fist. They are located below your ribs, one on each side of your spine. °CAUSES °Infections are caused by microbes, which are microscopic organisms, including fungi, viruses, and bacteria. These organisms are so small that they can only be seen through a microscope. Bacteria are the microbes that most commonly cause UTIs. °SYMPTOMS  °Symptoms of UTIs may vary by age and gender of the patient and by the location of the infection. Symptoms in young women typically include a frequent and intense urge to urinate and a painful, burning feeling in the bladder or urethra during urination. Older women and men are more likely to be tired, shaky, and weak and have muscle aches and abdominal pain. A fever may mean the infection is in your kidneys. Other symptoms of a kidney infection include pain in your back or sides below the ribs, nausea, and vomiting. °DIAGNOSIS °To diagnose a UTI, your caregiver will ask you about your symptoms. Your caregiver also will ask to provide a urine sample. The urine sample will be tested for bacteria and white blood cells. White blood cells are made by your body to help fight infection. °TREATMENT  °Typically, UTIs can be treated with medication. Because  most UTIs are caused by a bacterial infection, they usually can be treated with the use of antibiotics. The choice of antibiotic and length of treatment depend on your symptoms and the type of bacteria causing your infection. °HOME CARE INSTRUCTIONS °· If you were prescribed antibiotics, take them exactly as your caregiver instructs you. Finish the medication even if you feel better after you have only taken some of the medication. °· Drink enough water and fluids to keep your urine clear or pale yellow. °· Avoid caffeine, tea, and carbonated beverages. They tend to irritate your bladder. °· Empty your bladder often. Avoid holding urine for long periods of time. °· Empty your bladder before and after sexual intercourse. °· After a bowel movement, women should cleanse from front to back. Use each tissue only once. °SEEK MEDICAL CARE IF:  °· You have back pain. °· You develop a fever. °· Your symptoms do not begin to resolve within 3 days. °SEEK IMMEDIATE MEDICAL CARE IF:  °· You have severe back pain or lower abdominal pain. °· You develop chills. °· You have nausea or vomiting. °· You have continued burning or discomfort with urination. °MAKE SURE YOU:  °· Understand these instructions. °· Will watch your condition. °· Will get help right away   if you are not doing well or get worse. °Document Released: 09/17/2005 Document Revised: 06/08/2012 Document Reviewed: 01/16/2012 °ExitCare® Patient Information ©2015 [employer]. This information is not intended to replace advice given to you by your health care provider. Make sure you discuss any questions you have with your health care provider. ° °

## 2015-08-03 NOTE — ED Provider Notes (Signed)
CSN: [id number]     Arrival date & time 08/03/15  1454 History   First MD Initiated Contact with Patient 08/03/15 1646     Chief Complaint  Patient presents with  . Urinary Tract Infection   (Consider location/radiation/quality/duration/timing/severity/associated sxs/prior Treatment) HPI Comments: 29 year old female who is wheelchair bound and partially paralyzed is complaining of urinary infection symptoms. She has a history of frequent UTIs. She has been on several anabiotics and she states that the only one that will work and the only one that she will take is doxycycline. Her symptoms include dysuria, frequency, cloudy urine and pain with catheterization, bladder which she has to perform daily. Symptoms began 10-14 days ago. Denies fever, abdominal pain, vomiting. Complains some mild intermittent nausea and pain in the lower back.   Past Medical History  Diagnosis Date  . Spinal cord injury, cervical region C4-C5  . Asthma Mild, only with exposure to cigarette smoke  . Sciatica   . Recurrent UTI requires self catheterization  . Abnormal Pap smear   . ASCUS (atypical squamous cells of undetermined significance) on Pap smear 10/2011  . Hx: UTI (urinary tract infection) 11/27/11  . H/O scoliosis   . Urinary tract infection   . Horseshoe kidney   . Congenital absence of right kidney   . Recurrent nephrolithiasis     s/p stent   Past Surgical History  Procedure Laterality Date  . Back surgery  Scoliosis, Harrington Rods in place  . Orthopedic surgery  Multiple lower extremity surgeries as a child  . Lithotripsy    . Vascular surgery  IVC filter placed in 05/04/2004   Family History  Problem Relation Age of Onset  . Heart disease Mother     Massive MI in 05/04/05 ; died   Social History  Substance Use Topics  . Smoking status: Never Smoker   . Smokeless tobacco: Never Used  . Alcohol Use: No   OB History    Gravida Para Term Preterm AB TAB SAB Ectopic Multiple Living   0              Review of Systems  Constitutional: Negative.   HENT: Negative.   Cardiovascular: Negative.   Gastrointestinal: Positive for nausea. Negative for vomiting.  Genitourinary:       See history of present illness  Neurological: Negative.   Psychiatric/Behavioral: Negative.     Allergies  Bactrim; Lactose intolerance (gi); Metoclopramide hcl; and Benadryl  Home Medications   Prior to Admission medications   Medication Sig Start Date End Date Taking? Authorizing Provider  albuterol (PROVENTIL HFA;VENTOLIN HFA) 108 (90 BASE) MCG/ACT inhaler Inhale 2 puffs into the lungs every 6 (six) hours as needed for wheezing. For wheezing   Yes Historical Provider, MD  diazepam (VALIUM) 5 MG tablet Take 1 tablet (5 mg total) by mouth every 6 (six) hours as needed (for muscle spasms). 07/02/13  Yes Hannah Muthersbaugh, PA-C  HYDROmorphone (DILAUDID) 4 MG tablet Take 4 mg by mouth 2 (two) times daily as needed for pain. pain   Yes Historical Provider, MD  ibuprofen (ADVIL,MOTRIN) 800 MG tablet Take 1 tablet (800 mg total) by mouth 3 (three) times daily. 07/02/13  Yes Hannah Muthersbaugh, PA-C  nitrofurantoin (MACRODANTIN) 100 MG capsule Take 100 mg by mouth at bedtime.    Yes Historical Provider, MD  promethazine (PHENERGAN) 25 MG tablet Take 25 mg by mouth every 8 (eight) hours as needed for nausea.   Yes Historical Provider, MD  sertraline (ZOLOFT) 50 MG  tablet Take 75 mg by mouth daily.   Yes Historical Provider, MD  solifenacin (VESICARE) 10 MG tablet Take 10 mg by mouth every morning.    Yes Historical Provider, MD  zolpidem (AMBIEN) 10 MG tablet Take 10 mg by mouth at bedtime.    Yes Historical Provider, MD  cyclobenzaprine (FLEXERIL) 10 MG tablet Take 10 mg by mouth 3 (three) times daily as needed for muscle spasms.    Historical Provider, MD  doxycycline (VIBRAMYCIN) 100 MG capsule Take 1 capsule (100 mg total) by mouth 2 (two) times daily. 08/03/15   Hayden Rasmussen, NP   BP 105/60 mmHg  Pulse 113   Temp(Src) 97.7 F (36.5 C) (Oral)  Resp 16  SpO2 100%  LMP 04/03/2015 Physical Exam  Constitutional: No distress.  Cardiovascular: Normal rate.   Pulmonary/Chest: Effort normal. No respiratory distress.  Musculoskeletal: She exhibits no edema.  Neurological: She is alert. She exhibits normal muscle tone.  Skin: Skin is warm and dry.  Psychiatric: She has a normal mood and affect.  Nursing note and vitals reviewed.   ED Course  Procedures (including critical care time) Labs Review Labs Reviewed  POCT URINALYSIS DIP (DEVICE) - Abnormal; Notable for the following:    Hgb urine dipstick TRACE (*)    Protein, ur 100 (*)    Nitrite POSITIVE (*)    Leukocytes, UA LARGE (*)    All other components within normal limits  URINE CULTURE  URINALYSIS, DIPSTICK ONLY    Imaging Review No results found. Results for orders placed or performed during the hospital encounter of 08/03/15  POCT urinalysis dip (device)  Result Value Ref Range   Glucose, UA NEGATIVE NEGATIVE mg/dL   Bilirubin Urine NEGATIVE NEGATIVE   Ketones, ur NEGATIVE NEGATIVE mg/dL   Specific Gravity, Urine 1.020 1.005 - 1.030   Hgb urine dipstick TRACE (A) NEGATIVE   pH 6.5 5.0 - 8.0   Protein, ur 100 (A) NEGATIVE mg/dL   Urobilinogen, UA 0.2 0.0 - 1.0 mg/dL   Nitrite POSITIVE (A) NEGATIVE   Leukocytes, UA LARGE (A) NEGATIVE     MDM   1. UTI (lower urinary tract infection)    Doxy bid Lots of liquids Culture pending     Hayden Rasmussen, NP 08/03/15 1735

## 2015-08-03 NOTE — ED Notes (Signed)
Pt thinks she may have an UTI, she is wheelchair bound and is cathed daily.  She also reports burning   She denies fever

## 2016-02-01 ENCOUNTER — Emergency Department (HOSPITAL_COMMUNITY): Payer: Medicare Other

## 2016-02-01 ENCOUNTER — Emergency Department (HOSPITAL_COMMUNITY)
Admission: EM | Admit: 2016-02-01 | Discharge: 2016-02-02 | Disposition: A | Discharge status: Home / Self Care | Payer: Medicare Other | Attending: Emergency Medicine | Admitting: Emergency Medicine

## 2016-02-01 ENCOUNTER — Encounter (HOSPITAL_COMMUNITY): Payer: Self-pay

## 2016-02-01 DIAGNOSIS — Z3202 Encounter for pregnancy test, result negative: Secondary | ICD-10-CM | POA: Insufficient documentation

## 2016-02-01 DIAGNOSIS — Z87440 Personal history of urinary (tract) infections: Secondary | ICD-10-CM | POA: Diagnosis not present

## 2016-02-01 DIAGNOSIS — R109 Unspecified abdominal pain: Secondary | ICD-10-CM | POA: Diagnosis not present

## 2016-02-01 DIAGNOSIS — Z8739 Personal history of other diseases of the musculoskeletal system and connective tissue: Secondary | ICD-10-CM | POA: Diagnosis not present

## 2016-02-01 DIAGNOSIS — Z87442 Personal history of urinary calculi: Secondary | ICD-10-CM | POA: Diagnosis not present

## 2016-02-01 DIAGNOSIS — Z87828 Personal history of other (healed) physical injury and trauma: Secondary | ICD-10-CM | POA: Insufficient documentation

## 2016-02-01 DIAGNOSIS — Z79899 Other long term (current) drug therapy: Secondary | ICD-10-CM | POA: Diagnosis not present

## 2016-02-01 DIAGNOSIS — Z862 Personal history of diseases of the blood and blood-forming organs and certain disorders involving the immune mechanism: Secondary | ICD-10-CM | POA: Insufficient documentation

## 2016-02-01 DIAGNOSIS — R61 Generalized hyperhidrosis: Secondary | ICD-10-CM | POA: Insufficient documentation

## 2016-02-01 DIAGNOSIS — Z8669 Personal history of other diseases of the nervous system and sense organs: Secondary | ICD-10-CM | POA: Insufficient documentation

## 2016-02-01 DIAGNOSIS — Q631 Lobulated, fused and horseshoe kidney: Secondary | ICD-10-CM | POA: Diagnosis not present

## 2016-02-01 DIAGNOSIS — J45909 Unspecified asthma, uncomplicated: Secondary | ICD-10-CM | POA: Diagnosis not present

## 2016-02-01 DIAGNOSIS — R112 Nausea with vomiting, unspecified: Secondary | ICD-10-CM | POA: Diagnosis not present

## 2016-02-01 DIAGNOSIS — R140 Abdominal distension (gaseous): Secondary | ICD-10-CM | POA: Insufficient documentation

## 2016-02-01 DIAGNOSIS — Q600 Renal agenesis, unilateral: Secondary | ICD-10-CM | POA: Insufficient documentation

## 2016-02-01 DIAGNOSIS — R000 Tachycardia, unspecified: Secondary | ICD-10-CM | POA: Diagnosis not present

## 2016-02-01 HISTORY — DX: Autonomic dysreflexia: G90.4

## 2016-02-01 HISTORY — DX: Anemia, unspecified: D64.9

## 2016-02-01 LAB — CBC
HCT: 46 % (ref 36.0–46.0)
Hemoglobin: 14.3 g/dL (ref 12.0–15.0)
MCH: 26 pg (ref 26.0–34.0)
MCHC: 31.1 g/dL (ref 30.0–36.0)
MCV: 83.5 fL (ref 78.0–100.0)
PLATELETS: 543 10*3/uL — AB (ref 150–400)
RBC: 5.51 MIL/uL — AB (ref 3.87–5.11)
RDW: 20.5 % — ABNORMAL HIGH (ref 11.5–15.5)
WBC: 20.4 10*3/uL — ABNORMAL HIGH (ref 4.0–10.5)

## 2016-02-01 LAB — URINE MICROSCOPIC-ADD ON

## 2016-02-01 LAB — COMPREHENSIVE METABOLIC PANEL
ALK PHOS: 95 U/L (ref 38–126)
ALT: 15 U/L (ref 14–54)
AST: 21 U/L (ref 15–41)
Albumin: 4.3 g/dL (ref 3.5–5.0)
Anion gap: 17 — ABNORMAL HIGH (ref 5–15)
BUN: 32 mg/dL — ABNORMAL HIGH (ref 6–20)
CHLORIDE: 107 mmol/L (ref 101–111)
CO2: 23 mmol/L (ref 22–32)
CREATININE: 0.96 mg/dL (ref 0.44–1.00)
Calcium: 10.2 mg/dL (ref 8.9–10.3)
GFR calc Af Amer: >60 mL/min (ref >60)
GFR calc non Af Amer: >60 mL/min (ref >60)
GLUCOSE: 197 mg/dL — AB (ref 65–99)
Potassium: 3.4 mmol/L — ABNORMAL LOW (ref 3.5–5.1)
SODIUM: 147 mmol/L — AB (ref 135–145)
Total Bilirubin: 0.4 mg/dL (ref 0.3–1.2)
Total Protein: 10.1 g/dL — ABNORMAL HIGH (ref 6.5–8.1)

## 2016-02-01 LAB — URINALYSIS, ROUTINE W REFLEX MICROSCOPIC
Glucose, UA: NEGATIVE mg/dL
Hgb urine dipstick: NEGATIVE
KETONES UR: NEGATIVE mg/dL
NITRITE: NEGATIVE
PH: 5.5 (ref 5.0–8.0)
Protein, ur: 100 mg/dL — AB
Specific Gravity, Urine: 1.03 (ref 1.005–1.030)

## 2016-02-01 LAB — LIPASE, BLOOD: LIPASE: 32 U/L (ref 11–51)

## 2016-02-01 LAB — PREGNANCY, URINE: Preg Test, Ur: NEGATIVE

## 2016-02-01 MED ORDER — HYDROMORPHONE HCL 1 MG/ML IJ SOLN: 1.0000 mg | Freq: Once | INTRAMUSCULAR | Status: DC

## 2016-02-01 MED ORDER — SODIUM CHLORIDE 0.9 % IV BOLUS (SEPSIS)
1000.0000 mL | Freq: Once | INTRAVENOUS | Status: AC
  Administered 2016-02-01: 1000 mL via INTRAVENOUS

## 2016-02-01 MED ORDER — ONDANSETRON HCL 4 MG/2ML IJ SOLN
4.0000 mg | Freq: Once | INTRAMUSCULAR | Status: AC
  Administered 2016-02-01: 4 mg via INTRAVENOUS
  Filled 2016-02-01: qty 2

## 2016-02-01 MED ORDER — FENTANYL CITRATE (PF) 100 MCG/2ML IJ SOLN
25.0000 ug | INTRAMUSCULAR | Status: DC | PRN
  Administered 2016-02-01: 25 ug via INTRAVENOUS
  Filled 2016-02-01: qty 2

## 2016-02-01 MED ORDER — IOHEXOL 300 MG/ML  SOLN
75.0000 mL | Freq: Once | INTRAMUSCULAR | Status: AC | PRN
  Administered 2016-02-01: 75 mL via INTRAVENOUS

## 2016-02-01 MED ORDER — HYDROMORPHONE HCL 1 MG/ML IJ SOLN
1.0000 mg | Freq: Once | INTRAMUSCULAR | Status: AC
  Administered 2016-02-01: 1 mg via INTRAVENOUS
  Filled 2016-02-01: qty 1

## 2016-02-01 MED ORDER — HYDROMORPHONE HCL 1 MG/ML IJ SOLN
1.0000 mg | Freq: Once | INTRAMUSCULAR | Status: AC
  Administered 2016-02-02: 1 mg via INTRAVENOUS
  Filled 2016-02-01: qty 1

## 2016-02-01 NOTE — ED Provider Notes (Signed)
Medical screening examination/treatment/procedure(s) were conducted as a shared visit with non-physician practitioner(s) and myself.  I personally evaluated the patient during the encounter.   EKG Interpretation None     30 y.o. female presents with recurrent obstruction symptoms. Had a history of gastric outlet obstruction previously treated by NG decompression. No acute distress on exam. Considered feeding tube placement but patient declined on previous admissions. Her primary surgery team is at [facility] in [place]. Patient had recent CT scan which demonstrated the obstruction so plain film was attempted to re-demonstrate in an attempt to save the patient recurrent radiation. As plain film was unremarkable CT scan was eventually performed which was positive for recurrent gastric outlet obstruction. At this time I feel it is appropriate for the patient to be seen by her primary surgery team for continuity of care purposes and will arrange for transfer to that facility for further workup and management. Patient was requesting admission to this facility initially but the surgical team is not as familiar with her ongoing care and she agreed to the transfer. NG tube placed to begin decompression with IV fluid maintenance.  See related encounter note   Lyndal Pulley, MD 02/01/16 2340

## 2016-02-01 NOTE — ED Notes (Signed)
Patient states the color red that is in the canisters is V8 tomato juice. She says she loves to drink it.

## 2016-02-01 NOTE — ED Notes (Signed)
Patient transported to X-ray 

## 2016-02-01 NOTE — ED Provider Notes (Signed)
CSN: [id number]     Arrival date & time 02/01/16  1426 History   First MD Initiated Contact with Patient 02/01/16 1513     Chief Complaint  Patient presents with  . Abdominal Pain    HPI   [name] is a 30 y.o. female with a PMH of spinal cord injury, autonomic dysreflexia, recurrent UTI, congenital absence of right kidney, recurrent nephrolithiasis who presents to the ED with abdominal pain, nausea, and vomiting. Reports she was evaluated at [facility] for the same symptoms and discharged on Wednesday, and had recurrence of her symptoms last night. She states her symptoms are constant. She denies exacerbating factors, and states she has been using heat for symptom relief. She reports associated diaphoresis. She denies hematemesis, diarrhea, constipation, hematochezia, melena, dysuria, urgency, frequency.    Past Medical History  Diagnosis Date  . Spinal cord injury, cervical region (HCC) C4-C5  . Asthma Mild, only with exposure to cigarette smoke  . Sciatica   . Recurrent UTI requires self catheterization  . Abnormal Pap smear   . ASCUS (atypical squamous cells of undetermined significance) on Pap smear 10/2011  . Hx: UTI (urinary tract infection) 11/27/11  . H/O scoliosis   . Urinary tract infection   . Horseshoe kidney   . Congenital absence of right kidney   . Recurrent nephrolithiasis     s/p stent  . Autonomic dysreflexia   . Anemia    Past Surgical History  Procedure Laterality Date  . Back surgery  Scoliosis, Harrington Rods in place  . Orthopedic surgery  Multiple lower extremity surgeries as a child  . Lithotripsy    . Vascular surgery  IVC filter placed in 05/27/2004  . C3-6 spine surgery with rods    . Bilateral leg surgery    . Dvt filter placement     Family History  Problem Relation Age of Onset  . Heart disease Mother     Massive MI in 05/27/2005 ; died   Social History  Substance Use Topics  . Smoking status: Never Smoker   . Smokeless tobacco: Never Used  .  Alcohol Use: No   OB History    Gravida Para Term Preterm AB TAB SAB Ectopic Multiple Living   0               Review of Systems  Constitutional: Positive for diaphoresis.  Gastrointestinal: Positive for nausea, vomiting, abdominal pain and abdominal distention. Negative for diarrhea, constipation and blood in stool.  Genitourinary: Negative for dysuria, urgency and frequency.  All other systems reviewed and are negative.     Allergies  Lactose; Bactrim; Gabapentin; Metoclopramide hcl; Benadryl; and Morphine  Home Medications   Prior to Admission medications   Medication Sig Start Date End Date Taking? Authorizing Provider  albuterol (PROVENTIL HFA;VENTOLIN HFA) 108 (90 BASE) MCG/ACT inhaler Inhale 2 puffs into the lungs every 6 (six) hours as needed for wheezing. For wheezing   Yes Historical Provider, MD  diazepam (VALIUM) 5 MG tablet Take 1 tablet (5 mg total) by mouth every 6 (six) hours as needed (for muscle spasms). 07/02/13  Yes Hannah Muthersbaugh, PA-C  HYDROmorphone (DILAUDID) 4 MG tablet Take 4 mg by mouth 2 (two) times daily as needed for pain. pain   Yes Historical Provider, MD  nitrofurantoin (MACRODANTIN) 100 MG capsule Take 100 mg by mouth at bedtime.    Yes Historical Provider, MD  promethazine (PHENERGAN) 25 MG tablet Take 25 mg by mouth every 8 (eight) hours as  needed for nausea.   Yes Historical Provider, MD  sertraline (ZOLOFT) 50 MG tablet Take 75 mg by mouth daily.   Yes Historical Provider, MD  solifenacin (VESICARE) 10 MG tablet Take 10 mg by mouth every morning.    Yes Historical Provider, MD  zolpidem (AMBIEN) 10 MG tablet Take 10 mg by mouth at bedtime.    Yes Historical Provider, MD    BP 102/80 mmHg  Pulse 121  Temp(Src) 98.2 F (36.8 C) (Oral)  Resp 17  Ht 4\' 8"  (1.422 m)  Wt 40.824 kg  BMI 20.19 kg/m2  SpO2 92%  LMP 01/25/2016 Physical Exam  Constitutional: She is oriented to person, place, and time. She appears distressed.  Cachetic  appearing female, uncomfortable due to pain.  HENT:  Head: Normocephalic and atraumatic.  Right Ear: External ear normal.  Left Ear: External ear normal.  Nose: Nose normal.  Mouth/Throat: Uvula is midline, oropharynx is clear and moist and mucous membranes are normal.  Eyes: Conjunctivae, EOM and lids are normal. Pupils are equal, round, and reactive to light. Right eye exhibits no discharge. Left eye exhibits no discharge. No scleral icterus.  Neck: Normal range of motion. Neck supple.  Cardiovascular: Regular rhythm, normal heart sounds, intact distal pulses and normal pulses.  Tachycardia present.   Pulmonary/Chest: Effort normal and breath sounds normal. No respiratory distress. She has no wheezes. She has no rales.  Abdominal: Soft. Normal appearance and bowel sounds are normal. She exhibits distension. She exhibits no mass. There is tenderness. There is no rigidity, no rebound and no guarding.  Diffuse distention and TTP.  Musculoskeletal: Normal range of motion. She exhibits no edema or tenderness.  Neurological: She is alert and oriented to person, place, and time.  Skin: Skin is warm and intact. No rash noted. She is diaphoretic. No erythema. No pallor.  Psychiatric: She has a normal mood and affect. Her speech is normal and behavior is normal.  Nursing note and vitals reviewed.   ED Course  Procedures (including critical care time)  Labs Review Labs Reviewed  COMPREHENSIVE METABOLIC PANEL - Abnormal; Notable for the following:    Sodium 147 (*)    Potassium 3.4 (*)    Glucose, Bld 197 (*)    BUN 32 (*)    Total Protein 10.1 (*)    Anion gap 17 (*)    All other components within normal limits  CBC - Abnormal; Notable for the following:    WBC 20.4 (*)    RBC 5.51 (*)    RDW 20.5 (*)    Platelets 543 (*)    All other components within normal limits  URINALYSIS, ROUTINE W REFLEX MICROSCOPIC (NOT AT [facility]) - Abnormal; Notable for the following:    Color, Urine AMBER (*)     APPearance CLOUDY (*)    Bilirubin Urine MODERATE (*)    Protein, ur 100 (*)    Leukocytes, UA SMALL (*)    All other components within normal limits  URINE MICROSCOPIC-ADD ON - Abnormal; Notable for the following:    Squamous Epithelial / LPF 6-30 (*)    Bacteria, UA RARE (*)    Casts HYALINE CASTS (*)    All other components within normal limits  URINE CULTURE  LIPASE, BLOOD  PREGNANCY, URINE    Imaging Review Ct Abdomen Pelvis W Contrast  02/01/2016  CLINICAL DATA:  30 year old female with abdominal pain. EXAM: CT ABDOMEN AND PELVIS WITH CONTRAST TECHNIQUE: Multidetector CT imaging of the abdomen and pelvis  was performed using the standard protocol following bolus administration of intravenous contrast. CONTRAST:  75mL OMNIPAQUE IOHEXOL 300 MG/ML  SOLN COMPARISON:  CT dated 04/08/2013 FINDINGS: The visualized lung bases are clear. No intra-abdominal free air. Trace free fluid within the pelvis. The liver, gallbladder, pancreas, spleen, and the visualized adrenal glands appear unremarkable. 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 No hydronephrosis. The urinary bladder is partially decompressed around a Foley catheter. The uterus is anteverted and grossly unremarkable. An enteric tube is seen within the stomach at the gastroesophageal junction. Recommend advancement of the enteric tube into the stomach. The stomach is distended with gastric contents and fluid. Small amount of enteric fluid is noted within the duodenal bulb and proximal duodenum. The remainder of the small bowel are decompressed. A degree of gastric outlet obstruction or gastroparesis is not excluded. Clinical correlation is recommended. There is moderate stool throughout the colon. No evidence of small-bowel obstruction or inflammation. Normal appendix. The abdominal aorta appears unremarkable. An IVC filter is noted. No portal venous gas identified. There is no adenopathy. There is paucity of the subcutaneous soft  tissue fat and cachexia. There are bilateral ischial decubitus ulcers. No drainable fluid collection for abscess identified. There are sclerotic changes of the ischial tuberosity, increased from prior study. MRI or a white blood cell scintigraphy may provide better evaluation if there is clinical concern for acute osteomyelitis. There is scoliosis of the spine. Harrington rods are partially visualized in the upper thoracic spine with no acute fracture. IMPRESSION: Distention of the stomach with decompressed appearance of the small bowel concerning for a degree of gastric outlet obstruction versus gastroparesis. Clinical correlation is recommended. The tip of the enteric tube is likely level of the gastroesophageal junction. Recommend advancement of the tube into the stomach. No evidence of small-bowel obstruction. 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 Sclerotic changes of the ischial tuberosities, new from prior study. MRI or white blood cells scintigraphy may provide better evaluation if there is clinical concern for acute osteomyelitis. No drainable fluid collection/abscess identified. Electronically Signed   By: Elgie Collard M.D.   On: 02/01/2016 22:12   Dg Abd 2 Views  02/01/2016  CLINICAL DATA:  30 year old female with abdominal pain, distention and vomiting. EXAM: ABDOMEN - 2 VIEW COMPARISON:  None. FINDINGS: Gas and fluid in the ascending colon is noted. A moderate amount of stool within the descending and sigmoid colon noted. There is no evidence of pneumoperitoneum or dilated small bowel loops. Thoracolumbar surgical spinal hardware, IVC filter and NG tube overlying the proximal stomach noted. IMPRESSION: Moderate stool within the descending and sigmoid colon with some gas and fluid in the ascending colon noted. No evidence of small bowel obstruction or pneumoperitoneum. Electronically Signed   By: Harmon Pier M.D.   On: 02/01/2016 19:01   I have personally reviewed and  evaluated these images and lab results as part of my medical decision-making.   EKG Interpretation None      MDM   Final diagnoses:  Abdominal distention  Abdominal pain  Abdominal pain    30 year old female presents with abdominal distention, nausea, and vomiting. Per record review, patient evaluated recently at [facility] (admitted 1/31 and 2/5) for the same symptoms, which were thought to be related to severe malnutrition. She underwent NG tube placement. 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  Patient is afebrile. Tachycardic. Patient is diaphoretic and appears uncomfortable due to pain. Abdomen distended, with diffuse TTP.  Will place foley and NG tube to suction. Ordered fluids, antiemetic, pain medication. Will obtain plain film of abdomen. Patient discussed with Dr. Clydene Pugh. Anticipate transfer to [facility] for continuation of care by general surgery.  CBC remarkable for leukocytosis of 20.4, no anemia. CMP remarkable for sodium 147, potassium 3.4. Lipase within normal limits. UA negative for infection. Urine pregnancy negative. Plain film of abdomen remarkable for moderate stool, no evidence of small bowel obstruction or pneumoperitoneum. CT abdomen pelvis consistent with gastric outlet obstruction. Discussed findings with patient.   Spoke with patient at length regarding importance of admission. Patient initially requesting admission to this facility, however given patient has been followed by general surgery at [facility], feel transfer is appropriate at this time. Patient agrees to admission at [facility].  General surgery consulted. Unable to get a hold of general surgery. Spoke with [facility] ED attending Dr. Tora Perches. Patient to be transferred to [facility] ED for further evaluation and management.  BP 102/80 mmHg  Pulse 121  Temp(Src) 98.2 F  (36.8 C) (Oral)  Resp 17  Ht  (1.422 m)  Wt 40.824 kg  BMI 20.19 kg/m2  SpO2 92%  LMP 01/25/2016      Mady Gemma, PA-C 02/01/16 2350  Lyndal Pulley, MD 02/02/16 [phone]

## 2016-02-01 NOTE — ED Notes (Signed)
Patient returned from xray.

## 2016-02-01 NOTE — ED Notes (Signed)
IV start unsuccessful x2. IV team consult order put in for [place] IV access.

## 2016-02-01 NOTE — ED Notes (Signed)
Patient presents to ED by EMS for abdominal distension. Patient seen at [facility] on Wednesday for same problem. Patient's abdomen is distended and firm. Patient reports vomiting.

## 2016-02-01 NOTE — ED Notes (Signed)
Patient placed on Low Intermittent suction via NG tube. Orange liquid returned as output.

## 2016-02-02 DIAGNOSIS — Z862 Personal history of diseases of the blood and blood-forming organs and certain disorders involving the immune mechanism: Secondary | ICD-10-CM | POA: Diagnosis not present

## 2016-02-02 DIAGNOSIS — R000 Tachycardia, unspecified: Secondary | ICD-10-CM | POA: Diagnosis not present

## 2016-02-02 DIAGNOSIS — Q600 Renal agenesis, unilateral: Secondary | ICD-10-CM | POA: Diagnosis not present

## 2016-02-02 DIAGNOSIS — Z87442 Personal history of urinary calculi: Secondary | ICD-10-CM | POA: Diagnosis not present

## 2016-02-02 DIAGNOSIS — Z87828 Personal history of other (healed) physical injury and trauma: Secondary | ICD-10-CM | POA: Diagnosis not present

## 2016-02-02 DIAGNOSIS — Z3202 Encounter for pregnancy test, result negative: Secondary | ICD-10-CM | POA: Diagnosis not present

## 2016-02-02 DIAGNOSIS — Z79899 Other long term (current) drug therapy: Secondary | ICD-10-CM | POA: Diagnosis not present

## 2016-02-02 DIAGNOSIS — R112 Nausea with vomiting, unspecified: Secondary | ICD-10-CM | POA: Diagnosis not present

## 2016-02-02 DIAGNOSIS — Z87440 Personal history of urinary (tract) infections: Secondary | ICD-10-CM | POA: Diagnosis not present

## 2016-02-02 DIAGNOSIS — R109 Unspecified abdominal pain: Secondary | ICD-10-CM | POA: Diagnosis present

## 2016-02-02 DIAGNOSIS — R140 Abdominal distension (gaseous): Secondary | ICD-10-CM | POA: Diagnosis not present

## 2016-02-02 DIAGNOSIS — Z8739 Personal history of other diseases of the musculoskeletal system and connective tissue: Secondary | ICD-10-CM | POA: Diagnosis not present

## 2016-02-02 DIAGNOSIS — Z8669 Personal history of other diseases of the nervous system and sense organs: Secondary | ICD-10-CM | POA: Diagnosis not present

## 2016-02-02 DIAGNOSIS — R61 Generalized hyperhidrosis: Secondary | ICD-10-CM | POA: Diagnosis not present

## 2016-02-02 DIAGNOSIS — Q631 Lobulated, fused and horseshoe kidney: Secondary | ICD-10-CM | POA: Diagnosis not present

## 2016-02-02 DIAGNOSIS — J45909 Unspecified asthma, uncomplicated: Secondary | ICD-10-CM | POA: Diagnosis not present

## 2016-02-04 LAB — URINE CULTURE: Culture: >=100000

## 2016-02-06 ENCOUNTER — Telehealth (HOSPITAL_COMMUNITY): Payer: Self-pay

## 2016-02-06 NOTE — Telephone Encounter (Signed)
Post ED Visit - Positive Culture Follow-up: Successful Patient Follow-Up  Culture assessed and recommendations reviewed by:  Enzo Bi, Pharm.D.  Celedonio Miyamoto, Pharm.D., BCPS  Garvin Fila, Pharm.D.  Georgina Pillion, Pharm.D., BCPS  Williams, [address].D., BCPS, AAHIVP  Estella Husk, Pharm.D., BCPS, AAHIVP  Cassie Stewart, Pharm.D.  Sherle Poe, [address].D.  Positive urine culture, >/= 100,000 colonies -> E Coli  Pt transferred to [facility] -> Fax over results  Contacted patient, date 02/06/2016, time 10:10 Spoke w/[facility], pt not there.  Spoke w/E. Daphine Deutscher PharmD regarding pt not being there and she said no further f/u as was not pts primary problem.   Arvid Right 02/06/2016, 10:17 AM

## 2016-12-10 ENCOUNTER — Emergency Department (HOSPITAL_COMMUNITY): Payer: Medicare Other

## 2016-12-10 ENCOUNTER — Emergency Department (HOSPITAL_COMMUNITY)
Admission: EM | Admit: 2016-12-10 | Discharge: 2016-12-10 | Disposition: A | Discharge status: Home / Self Care | Payer: Medicare Other | Attending: Emergency Medicine | Admitting: Emergency Medicine

## 2016-12-10 ENCOUNTER — Other Ambulatory Visit (HOSPITAL_COMMUNITY): Payer: Self-pay

## 2016-12-10 DIAGNOSIS — R112 Nausea with vomiting, unspecified: Secondary | ICD-10-CM | POA: Diagnosis not present

## 2016-12-10 DIAGNOSIS — G8929 Other chronic pain: Secondary | ICD-10-CM | POA: Insufficient documentation

## 2016-12-10 DIAGNOSIS — R109 Unspecified abdominal pain: Secondary | ICD-10-CM | POA: Insufficient documentation

## 2016-12-10 DIAGNOSIS — J45909 Unspecified asthma, uncomplicated: Secondary | ICD-10-CM | POA: Diagnosis not present

## 2016-12-10 DIAGNOSIS — R202 Paresthesia of skin: Secondary | ICD-10-CM | POA: Diagnosis not present

## 2016-12-10 DIAGNOSIS — K5900 Constipation, unspecified: Secondary | ICD-10-CM | POA: Insufficient documentation

## 2016-12-10 DIAGNOSIS — M545 Low back pain: Secondary | ICD-10-CM | POA: Insufficient documentation

## 2016-12-10 LAB — URINALYSIS, ROUTINE W REFLEX MICROSCOPIC
BILIRUBIN URINE: NEGATIVE
Glucose, UA: NEGATIVE mg/dL
Hgb urine dipstick: NEGATIVE
Ketones, ur: NEGATIVE mg/dL
Nitrite: POSITIVE — AB
PH: 6 (ref 5.0–8.0)
Protein, ur: 30 mg/dL — AB
SPECIFIC GRAVITY, URINE: 1.017 (ref 1.005–1.030)
Squamous Epithelial / LPF: NONE SEEN

## 2016-12-10 LAB — COMPREHENSIVE METABOLIC PANEL
ALT: 8 U/L — ABNORMAL LOW (ref 14–54)
ANION GAP: 8 (ref 5–15)
AST: 14 U/L — AB (ref 15–41)
Albumin: 3.1 g/dL — ABNORMAL LOW (ref 3.5–5.0)
Alkaline Phosphatase: 61 U/L (ref 38–126)
BILIRUBIN TOTAL: 0.6 mg/dL (ref 0.3–1.2)
BUN: 11 mg/dL (ref 6–20)
CALCIUM: 8.4 mg/dL — AB (ref 8.9–10.3)
CO2: 25 mmol/L (ref 22–32)
Chloride: 105 mmol/L (ref 101–111)
Creatinine, Ser: 0.46 mg/dL (ref 0.44–1.00)
GFR calc Af Amer: >60 mL/min (ref >60)
Glucose, Bld: 90 mg/dL (ref 65–99)
POTASSIUM: 3.7 mmol/L (ref 3.5–5.1)
Sodium: 138 mmol/L (ref 135–145)
TOTAL PROTEIN: 7.1 g/dL (ref 6.5–8.1)

## 2016-12-10 LAB — CBC WITH DIFFERENTIAL/PLATELET
Basophils Absolute: 0 10*3/uL (ref 0.0–0.1)
Basophils Relative: 1 %
Eosinophils Absolute: 0.2 10*3/uL (ref 0.0–0.7)
Eosinophils Relative: 3 %
HEMATOCRIT: 35.3 % — AB (ref 36.0–46.0)
Hemoglobin: 11 g/dL — ABNORMAL LOW (ref 12.0–15.0)
LYMPHS PCT: 30 %
Lymphs Abs: 1.7 10*3/uL (ref 0.7–4.0)
MCH: 25.5 pg — ABNORMAL LOW (ref 26.0–34.0)
MCHC: 31.2 g/dL (ref 30.0–36.0)
MCV: 81.9 fL (ref 78.0–100.0)
MONO ABS: 0.3 10*3/uL (ref 0.1–1.0)
MONOS PCT: 6 %
NEUTROS ABS: 3.4 10*3/uL (ref 1.7–7.7)
Neutrophils Relative %: 60 %
Platelets: 232 10*3/uL (ref 150–400)
RBC: 4.31 MIL/uL (ref 3.87–5.11)
RDW: 15.3 % (ref 11.5–15.5)
WBC: 5.6 10*3/uL (ref 4.0–10.5)

## 2016-12-10 LAB — I-STAT BETA HCG BLOOD, ED (MC, WL, AP ONLY): I-stat hCG, quantitative: <5 m[IU]/mL (ref <5)

## 2016-12-10 MED ORDER — HYDROMORPHONE HCL 2 MG/ML IJ SOLN
2.0000 mg | Freq: Once | INTRAMUSCULAR | Status: AC
  Administered 2016-12-10: 2 mg via INTRAVENOUS
  Filled 2016-12-10: qty 1

## 2016-12-10 MED ORDER — SODIUM CHLORIDE 0.9 % IV BOLUS (SEPSIS)
1000.0000 mL | Freq: Once | INTRAVENOUS | Status: AC
  Administered 2016-12-10: 1000 mL via INTRAVENOUS

## 2016-12-10 MED ORDER — HYDROMORPHONE HCL 2 MG/ML IJ SOLN
0.5000 mg | Freq: Once | INTRAMUSCULAR | Status: AC
  Administered 2016-12-10: 0.5 mg via INTRAVENOUS
  Filled 2016-12-10: qty 1

## 2016-12-10 MED ORDER — HYDROMORPHONE HCL 2 MG/ML IJ SOLN
0.5000 mg | Freq: Once | INTRAMUSCULAR | Status: AC
Start: 2016-12-10 — End: 2016-12-10
  Administered 2016-12-10: 0.5 mg via INTRAVENOUS
  Filled 2016-12-10: qty 1

## 2016-12-10 MED ORDER — ONDANSETRON HCL 4 MG/2ML IJ SOLN
4.0000 mg | Freq: Once | INTRAMUSCULAR | Status: AC
  Administered 2016-12-10: 4 mg via INTRAVENOUS
  Filled 2016-12-10: qty 2

## 2016-12-10 MED ORDER — DIAZEPAM 5 MG PO TABS
5.0000 mg | ORAL_TABLET | Freq: Once | ORAL | Status: AC
  Administered 2016-12-10: 5 mg via ORAL
  Filled 2016-12-10: qty 1

## 2016-12-10 MED ORDER — FOSFOMYCIN TROMETHAMINE 3 G PO PACK
3.0000 g | PACK | Freq: Once | ORAL | Status: AC
  Administered 2016-12-10: 3 g via ORAL
  Filled 2016-12-10: qty 3

## 2016-12-10 NOTE — Discharge Instructions (Signed)
Urine shows that you do have a urinary tract infection.  You have a one-time dose of antibiotic, Monurol, and  will not need any further antibiotics at this time.  Please continue taking your normal home medications x-ray also shows that you're constipated.  Please try using Mira lax or suppositories for more frequent stools

## 2016-12-10 NOTE — ED Notes (Signed)
Pt verbalizes understanding of discharge and follow up isntructions. [facility]

## 2016-12-10 NOTE — ED Notes (Signed)
Pt given meal

## 2016-12-10 NOTE — ED Triage Notes (Addendum)
Lives  At home with boyfriend and  Since sat has had increasing back pain that radiates dow her leg has had on and off numbness also she states has hx of spinal cord injury in 05 and has been I n [facility] since, pt has had no falls but has had some nausea and vom ited x 2 yesterday has  Not been able to  Take her pain meds or nausea meds

## 2016-12-10 NOTE — ED Notes (Signed)
[facility] at bedside. Pt changed and updated on impending discharge.
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
## 2016-12-10 NOTE — ED Notes (Signed)
Per previous RN caring for patient, several attempts at catheterization have been done without success, currently awaiting pt's significant other to arrive to assist in attempt to cath patient.

## 2016-12-10 NOTE — ED Notes (Addendum)
There have been 9 nurses that have attempted to cath this pt.  Unable the boyfirend is coming from home to attempt

## 2016-12-10 NOTE — ED Notes (Signed)
Pt tolerating PO crackers and gingerale without issue

## 2016-12-10 NOTE — ED Notes (Addendum)
Numerus unsuccessful attempts to cath this pt  Her boyfriend usually does it he home sick in bed

## 2016-12-10 NOTE — ED Notes (Signed)
Pt alert, NAD, calm, interactive, resps e/u, no dyspnea noted, speech clear, states, "feel fine", VSS, BP consistently low per norm per pt, "ready to go", [facility].

## 2016-12-10 NOTE — ED Notes (Signed)
Pt retuirned from us wants pain med

## 2016-12-10 NOTE — ED Provider Notes (Signed)
Review of urine shows that she has a urinary tract infection due to her numerous drug allergies.  She's been given a 3 g dose of Manurol and will not need any further antibiotic.   Earley FavorGail Danyla Wattley, NP 12/10/16 2127    Earley FavorGail Kasey Ewings, NP 12/10/16 [id number]    Jacalyn LefevreJulie Haviland, MD 12/11/16 [phone]

## 2016-12-13 LAB — URINE CULTURE

## 2016-12-14 ENCOUNTER — Telehealth: Payer: Self-pay

## 2016-12-14 NOTE — Progress Notes (Signed)
ED Antimicrobial Stewardship Positive Culture Follow Up   [name] is an 30 y.o. female who [facility] with a chief complaint of  Chief Complaint  Patient presents with  . Back Pain    Recent Results (from the past 720 hour(s))  Urine culture     Status: Abnormal   Collection Time: 12/10/16  8:41 PM  Result Value Ref Range Status   Specimen Description URINE, CATHETERIZED  Final   Special Requests NONE  Final   Culture (A)  Final    >=100,000 COLONIES/mL ESCHERICHIA COLI >=100,000 COLONIES/mL PSEUDOMONAS AERUGINOSA    Report Status 12/13/2016 FINAL  Final   Organism ID, Bacteria ESCHERICHIA COLI (A)  Final   Organism ID, Bacteria PSEUDOMONAS AERUGINOSA (A)  Final      Susceptibility   Escherichia coli - MIC*    AMPICILLIN <=2 SENSITIVE Sensitive     CEFAZOLIN <=4 SENSITIVE Sensitive     CEFTRIAXONE <=1 SENSITIVE Sensitive     CIPROFLOXACIN <=0.25 SENSITIVE Sensitive     GENTAMICIN <=1 SENSITIVE Sensitive     IMIPENEM <=0.25 SENSITIVE Sensitive     NITROFURANTOIN <=16 SENSITIVE Sensitive     TRIMETH/SULFA <=20 SENSITIVE Sensitive     AMPICILLIN/SULBACTAM <=2 SENSITIVE Sensitive     PIP/TAZO <=4 SENSITIVE Sensitive     Extended ESBL NEGATIVE Sensitive     * >=100,000 COLONIES/mL ESCHERICHIA COLI   Pseudomonas aeruginosa - MIC*    CEFTAZIDIME 2 SENSITIVE Sensitive     CIPROFLOXACIN <=0.25 SENSITIVE Sensitive     GENTAMICIN <=1 SENSITIVE Sensitive     IMIPENEM 2 SENSITIVE Sensitive     PIP/TAZO 8 SENSITIVE Sensitive     CEFEPIME <=1 SENSITIVE Sensitive     * >=100,000 COLONIES/mL PSEUDOMONAS AERUGINOSA    [x]  Patient discharged originally without antimicrobial agent and treatment is now indicated  Plan: Pt received fosfomycin 3g x1. Call pt to see if still having symptoms. If yes, give ciprofloxacin 500 mg PO BID for 7 days. If no, no treatment indicated.  ED Provider: Melburn HakeNicole Nadeau, PA-C   Mackie Paienee Ackley, PharmD PGY1 Pharmacy  Resident Pager: [phone] 12/14/2016 10:11 AM

## 2016-12-14 NOTE — Telephone Encounter (Signed)
Post ED Visit - Positive Culture Follow-up: Unsuccessful Patient Follow-up  Culture assessed and recommendations reviewed by: []  Enzo BiNathan Batchelder, Pharm.D. []  Celedonio MiyamotoJeremy Frens, Pharm.D., BCPS []  Garvin FilaMike Maccia, Pharm.D. []  Georgina PillionElizabeth Martin, [address]Ph[address]D., BCPS []  Shell LakeMinh Pham, [address]Ph[address]D., BCPS, AAHIVP []  Estella HuskMichelle Turner, Pharm.D., BCPS, AAHIVP []  Tennis Mustassie Stewart, Pharm.D. []  Sherle Poeob Vincent, [place]. Renee Ackley Pharm D Positive urine culture  [x]  Patient discharged without antimicrobial prescription and treatment is now indicated []  Organism is resistant to prescribed ED discharge antimicrobial []  Patient with positive blood cultures   *Check to see if still having symptoms if so needs Abx  Unable to contact patient after 3 attempts, letter will be sent to address on file  [name], [name] 12/14/2016, 12:15 PM

## 2016-12-17 ENCOUNTER — Encounter (HOSPITAL_COMMUNITY): Payer: Self-pay | Admitting: Emergency Medicine

## 2016-12-17 ENCOUNTER — Emergency Department (HOSPITAL_COMMUNITY)
Admission: EM | Admit: 2016-12-17 | Discharge: 2016-12-17 | Disposition: A | Discharge status: Home / Self Care | Payer: Medicare Other | Attending: Emergency Medicine | Admitting: Emergency Medicine

## 2016-12-17 DIAGNOSIS — R197 Diarrhea, unspecified: Secondary | ICD-10-CM | POA: Diagnosis not present

## 2016-12-17 DIAGNOSIS — J45909 Unspecified asthma, uncomplicated: Secondary | ICD-10-CM | POA: Insufficient documentation

## 2016-12-17 DIAGNOSIS — R112 Nausea with vomiting, unspecified: Secondary | ICD-10-CM | POA: Insufficient documentation

## 2016-12-17 LAB — URINALYSIS, ROUTINE W REFLEX MICROSCOPIC
BILIRUBIN URINE: NEGATIVE
Bacteria, UA: NONE SEEN
GLUCOSE, UA: NEGATIVE mg/dL
KETONES UR: NEGATIVE mg/dL
Nitrite: NEGATIVE
PH: 5 (ref 5.0–8.0)
PROTEIN: NEGATIVE mg/dL
Specific Gravity, Urine: 1.019 (ref 1.005–1.030)

## 2016-12-17 LAB — COMPREHENSIVE METABOLIC PANEL
ALK PHOS: 63 U/L (ref 38–126)
ALT: 8 U/L — ABNORMAL LOW (ref 14–54)
ANION GAP: 7 (ref 5–15)
AST: 16 U/L (ref 15–41)
Albumin: 3 g/dL — ABNORMAL LOW (ref 3.5–5.0)
BUN: 14 mg/dL (ref 6–20)
CALCIUM: 8.1 mg/dL — AB (ref 8.9–10.3)
CHLORIDE: 111 mmol/L (ref 101–111)
CO2: 24 mmol/L (ref 22–32)
Creatinine, Ser: 0.38 mg/dL — ABNORMAL LOW (ref 0.44–1.00)
Glucose, Bld: 90 mg/dL (ref 65–99)
Potassium: 4.5 mmol/L (ref 3.5–5.1)
SODIUM: 142 mmol/L (ref 135–145)
Total Bilirubin: 0.4 mg/dL (ref 0.3–1.2)
Total Protein: 6.4 g/dL — ABNORMAL LOW (ref 6.5–8.1)

## 2016-12-17 LAB — CBC
HCT: 34.1 % — ABNORMAL LOW (ref 36.0–46.0)
HEMOGLOBIN: 10.8 g/dL — AB (ref 12.0–15.0)
MCH: 26 pg (ref 26.0–34.0)
MCHC: 31.7 g/dL (ref 30.0–36.0)
MCV: 82 fL (ref 78.0–100.0)
PLATELETS: 203 10*3/uL (ref 150–400)
RBC: 4.16 MIL/uL (ref 3.87–5.11)
RDW: 15.4 % (ref 11.5–15.5)
WBC: 7.4 10*3/uL (ref 4.0–10.5)

## 2016-12-17 LAB — LIPASE, BLOOD: LIPASE: 16 U/L (ref 11–51)

## 2016-12-17 LAB — I-STAT BETA HCG BLOOD, ED (MC, WL, AP ONLY): I-stat hCG, quantitative: <5 m[IU]/mL (ref <5)

## 2016-12-17 MED ORDER — ONDANSETRON HCL 4 MG/2ML IJ SOLN
4.0000 mg | Freq: Once | INTRAMUSCULAR | Status: AC
  Administered 2016-12-17: 4 mg via INTRAVENOUS
  Filled 2016-12-17: qty 2

## 2016-12-17 MED ORDER — HYDROMORPHONE HCL 2 MG/ML IJ SOLN
1.0000 mg | Freq: Once | INTRAMUSCULAR | Status: AC
  Administered 2016-12-17: 1 mg via INTRAVENOUS
  Filled 2016-12-17: qty 1

## 2016-12-17 MED ORDER — SODIUM CHLORIDE 0.9 % IV BOLUS (SEPSIS)
1000.0000 mL | Freq: Once | INTRAVENOUS | Status: AC
  Administered 2016-12-17: 1000 mL via INTRAVENOUS

## 2016-12-17 MED ORDER — GI COCKTAIL ~~LOC~~
30.0000 mL | Freq: Once | ORAL | Status: AC
  Administered 2016-12-17: 30 mL via ORAL
  Filled 2016-12-17: qty 30

## 2016-12-17 NOTE — ED Notes (Signed)
Pt needs to be in and out catheterized for a urine sample but it cannot be obtained in a [facility] bed.  MD made aware.

## 2016-12-17 NOTE — ED Triage Notes (Signed)
Pt from home with complaints of abdominal pain that began around 11am. 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 Pt is nonambulatory due to spinal cord injury as well. Pt has complaints of 4 episodes of emesis today and "copious amounts of diarrhea without blood". Pt also has complaints of pressure ulcers on her bottom.

## 2016-12-17 NOTE — ED Notes (Signed)
Unsuccessful blood draw attempt x2. 

## 2016-12-17 NOTE — ED Notes (Signed)
[facility] called for transport.  

## 2016-12-17 NOTE — Discharge Instructions (Signed)
As discussed, your evaluation today has been largely reassuring.  But, it is important that you monitor your condition carefully, and do not hesitate to return to the ED if you develop new, or concerning changes in your condition. ? ?Otherwise, please follow-up with your physician for appropriate ongoing care. ? ?

## 2016-12-17 NOTE — ED Provider Notes (Signed)
[facility]-EMERGENCY DEPT Provider Note   CSN: [id number] Arrival date & time: 12/17/16  1541     History   Chief Complaint Chief Complaint  Patient presents with  . Nausea  . Abdominal Pain    HPI [name] is a 30 y.o. female.  HPI Patient w Hx of cervical spine injury p/w n/v/d and abd pain.  She was in her [facility] until ~5h pta.  She recalls eating at a new fast food restaurant yesterday, but no other recent med / diet / activity changes.  Since onset of Sx there has been mid abd soreness, and n/v persistently.  She is unable to take her zofran and dilaudid.  Typically the patient requires manual rectal stimulation for defecation, but has had spontaneous diarrhea during this illness. She denies other complaints including fever, confusion, syncope, chest pain  Past Medical History:  Diagnosis Date  . Abnormal Pap smear   . Anemia   . ASCUS (atypical squamous cells of undetermined significance) on Pap smear 10/2011  . Asthma Mild, only with exposure to cigarette smoke  . Autonomic dysreflexia   . Congenital absence of right kidney   . H/O scoliosis   . Horseshoe kidney   . Hx: UTI (urinary tract infection) 11/27/11  . Recurrent nephrolithiasis    s/p stent  . Recurrent UTI requires self catheterization  . Sciatica   . Spinal cord injury, cervical region (HCC) C4-C5  . Urinary tract infection     Patient Active Problem List   Diagnosis Date Noted  . Oligomenorrhea 05/19/2012    Past Surgical History:  Procedure Laterality Date  . BACK SURGERY  Scoliosis, Harrington Rods in place  . Bilateral leg surgery    . C3-6 spine surgery with rods    . dvt filter placement    . LITHOTRIPSY    . ORTHOPEDIC SURGERY  Multiple lower extremity surgeries as a child  . VASCULAR SURGERY  IVC filter placed in 2005    OB History    Gravida Para Term Preterm AB Living   0             SAB TAB Ectopic Multiple Live Births                   Home Medications    Prior to  Admission medications   Medication Sig Start Date End Date Taking? Authorizing Provider  albuterol (PROVENTIL HFA;VENTOLIN HFA) 108 (90 BASE) MCG/ACT inhaler Inhale 2 puffs into the lungs every 6 (six) hours as needed for wheezing. For wheezing    Historical Provider, MD  diazepam (VALIUM) 5 MG tablet Take 1 tablet (5 mg total) by mouth every 6 (six) hours as needed (for muscle spasms). 07/02/13   Hannah Muthersbaugh, PA-C  HYDROmorphone (DILAUDID) 4 MG tablet Take 4 mg by mouth 2 (two) times daily as needed for pain. pain    Historical Provider, MD  nitrofurantoin (MACRODANTIN) 100 MG capsule Take 100 mg by mouth at bedtime.     Historical Provider, MD  promethazine (PHENERGAN) 25 MG tablet Take 25 mg by mouth every 8 (eight) hours as needed for nausea.    Historical Provider, MD  sertraline (ZOLOFT) 50 MG tablet Take 75 mg by mouth daily.    Historical Provider, MD  solifenacin (VESICARE) 10 MG tablet Take 10 mg by mouth every morning.     Historical Provider, MD  zolpidem (AMBIEN) 10 MG tablet Take 10 mg by mouth at bedtime.     Historical Provider, MD  Family History Family History  Problem Relation Age of Onset  . Heart disease Mother     Massive MI in 2006 ; died    Social History Social History  Substance Use Topics  . Smoking status: Never Smoker  . Smokeless tobacco: Never Used  . Alcohol use No     Allergies   Lactose; Bactrim [sulfamethoxazole-trimethoprim]; Gabapentin; Metoclopramide hcl; Benadryl [diphenhydramine hcl]; and Morphine   Review of Systems Review of Systems  Constitutional:       Per HPI, otherwise negative  HENT:       Per HPI, otherwise negative  Respiratory:       Per HPI, otherwise negative  Cardiovascular:       Per HPI, otherwise negative  Gastrointestinal: Positive for abdominal pain, diarrhea, nausea and vomiting.  Endocrine:       Negative aside from HPI  Genitourinary:       Neg aside from HPI   Musculoskeletal:       Per HPI,  otherwise negative  Skin: Negative.   Neurological: Positive for weakness. Negative for syncope.     Physical Exam Updated Vital Signs BP 98/60 (BP Location: Left Arm)   Pulse 77   Temp 97.5 F (36.4 C) (Oral)   Resp 18   SpO2 96%   Physical Exam  Constitutional: She is oriented to person, place, and time. No distress.  Ill-appearing young female awake, alert, speaking clearly  HENT:  Head: Normocephalic and atraumatic.  Eyes: Conjunctivae and EOM are normal.  Cardiovascular: Normal rate and regular rhythm.   Pulmonary/Chest: Effort normal and breath sounds normal. No stridor. No respiratory distress.  Abdominal: She exhibits no distension. There is no hepatosplenomegaly. There is tenderness in the epigastric area. There is no rigidity and no guarding.  Musculoskeletal: She exhibits no edema.  Neurological: She is alert and oriented to person, place, and time. She displays atrophy. No cranial nerve deficit. She exhibits abnormal muscle tone.  Skin: Skin is warm and dry.  Psychiatric: She has a normal mood and affect.  Nursing note and vitals reviewed.  Patient has neurogenic bladder, requires Foley catheter when receiving fluid boluses  ED Treatments / Results  Labs (all labs ordered are listed, but only abnormal results are displayed) Labs Reviewed  COMPREHENSIVE METABOLIC PANEL - Abnormal; Notable for the following:       Result Value   Creatinine, Ser 0.38 (*)    Calcium 8.1 (*)    Total Protein 6.4 (*)    Albumin 3.0 (*)    ALT 8 (*)    All other components within normal limits  CBC - Abnormal; Notable for the following:    Hemoglobin 10.8 (*)    HCT 34.1 (*)    All other components within normal limits  URINALYSIS, ROUTINE W REFLEX MICROSCOPIC - Abnormal; Notable for the following:    Hgb urine dipstick SMALL (*)    Leukocytes, UA SMALL (*)    Squamous Epithelial / LPF 0-5 (*)    All other components within normal limits  LIPASE, BLOOD  I-STAT BETA HCG  BLOOD, ED ([facility], [facility], [facility] ONLY)    Procedures Procedures (including critical care time)  Medications Ordered in ED Medications  sodium chloride 0.9 % bolus 1,000 mL (0 mLs Intravenous Stopped 12/17/16 1750)  ondansetron (ZOFRAN) injection 4 mg (4 mg Intravenous Given 12/17/16 1737)  HYDROmorphone (DILAUDID) injection 1 mg (1 mg Intravenous Given 12/17/16 1737)  gi cocktail (Maalox,Lidocaine,Donnatal) (30 mLs Oral Given 12/17/16 1738)  HYDROmorphone (  DILAUDID) injection 1 mg (1 mg Intravenous Given 12/17/16 1930)  ondansetron (ZOFRAN) injection 4 mg (4 mg Intravenous Given 12/17/16 1930)     Initial Impression / Assessment and Plan / ED Course  I have reviewed the triage vital signs and the nursing notes.  Pertinent labs & imaging results that were available during my care of the patient were reviewed by me and considered in my medical decision making (see chart for details).  Clinical Course     9:50 PM Patient awake and alert using her cellular telephone, in no distress. Vital signs remained unremarkable. I again discussed with her all findings, concern for viral gastroenteritis, and now, with no ongoing vomiting, no ongoing diarrhea, patient seems to be improving substantially. She has tolerated fluid resuscitation. Encouraged her to continue fluids, rest, follow-up with primary care. Absent evidence for peritonitis, acute abnormality on labs, vitals, patient discharged in stable condition.  Final Clinical Impressions(s) / ED Diagnoses  Nausea and vomiting   Gerhard Munchobert Suraya Vidrine, MD 12/17/16 2151

## 2017-03-22 DIAGNOSIS — N390 Urinary tract infection, site not specified: Secondary | ICD-10-CM

## 2017-03-22 HISTORY — DX: Urinary tract infection, site not specified: N39.0

## 2017-04-02 ENCOUNTER — Emergency Department (HOSPITAL_COMMUNITY): Payer: Medicare Other

## 2017-04-02 ENCOUNTER — Encounter (HOSPITAL_COMMUNITY): Payer: Self-pay | Admitting: Emergency Medicine

## 2017-04-02 ENCOUNTER — Emergency Department (HOSPITAL_COMMUNITY)
Admission: EM | Admit: 2017-04-02 | Discharge: 2017-04-02 | Disposition: A | Discharge status: Home / Self Care | Payer: Medicare Other | Attending: Emergency Medicine | Admitting: Emergency Medicine

## 2017-04-02 DIAGNOSIS — J45909 Unspecified asthma, uncomplicated: Secondary | ICD-10-CM | POA: Diagnosis not present

## 2017-04-02 DIAGNOSIS — J181 Lobar pneumonia, unspecified organism: Secondary | ICD-10-CM | POA: Insufficient documentation

## 2017-04-02 DIAGNOSIS — N3001 Acute cystitis with hematuria: Secondary | ICD-10-CM | POA: Diagnosis not present

## 2017-04-02 DIAGNOSIS — Z79899 Other long term (current) drug therapy: Secondary | ICD-10-CM | POA: Insufficient documentation

## 2017-04-02 DIAGNOSIS — R05 Cough: Secondary | ICD-10-CM | POA: Diagnosis present

## 2017-04-02 DIAGNOSIS — Z87828 Personal history of other (healed) physical injury and trauma: Secondary | ICD-10-CM | POA: Diagnosis not present

## 2017-04-02 DIAGNOSIS — J189 Pneumonia, unspecified organism: Secondary | ICD-10-CM

## 2017-04-02 LAB — COMPREHENSIVE METABOLIC PANEL
ALBUMIN: 3.5 g/dL (ref 3.5–5.0)
ALK PHOS: 83 U/L (ref 38–126)
ALT: 12 U/L — ABNORMAL LOW (ref 14–54)
AST: 21 U/L (ref 15–41)
Anion gap: 9 (ref 5–15)
BUN: 9 mg/dL (ref 6–20)
CALCIUM: 8.6 mg/dL — AB (ref 8.9–10.3)
CHLORIDE: 99 mmol/L — AB (ref 101–111)
CO2: 28 mmol/L (ref 22–32)
CREATININE: 0.63 mg/dL (ref 0.44–1.00)
GFR calc Af Amer: >60 mL/min (ref >60)
GFR calc non Af Amer: >60 mL/min (ref >60)
GLUCOSE: 111 mg/dL — AB (ref 65–99)
Potassium: 3.5 mmol/L (ref 3.5–5.1)
SODIUM: 136 mmol/L (ref 135–145)
Total Bilirubin: 0.8 mg/dL (ref 0.3–1.2)
Total Protein: 8.8 g/dL — ABNORMAL HIGH (ref 6.5–8.1)

## 2017-04-02 LAB — INFLUENZA PANEL BY PCR (TYPE A & B)
INFLBPCR: NEGATIVE
Influenza A By PCR: NEGATIVE

## 2017-04-02 LAB — URINALYSIS, ROUTINE W REFLEX MICROSCOPIC
Bilirubin Urine: NEGATIVE
Glucose, UA: NEGATIVE mg/dL
KETONES UR: NEGATIVE mg/dL
Nitrite: POSITIVE — AB
PH: 5 (ref 5.0–8.0)
PROTEIN: 30 mg/dL — AB
Specific Gravity, Urine: 1.016 (ref 1.005–1.030)

## 2017-04-02 LAB — CBC WITH DIFFERENTIAL/PLATELET
BASOS PCT: 0 %
Basophils Absolute: 0 10*3/uL (ref 0.0–0.1)
EOS ABS: 0.1 10*3/uL (ref 0.0–0.7)
Eosinophils Relative: 1 %
HCT: 34.6 % — ABNORMAL LOW (ref 36.0–46.0)
HEMOGLOBIN: 10.9 g/dL — AB (ref 12.0–15.0)
LYMPHS ABS: 1.1 10*3/uL (ref 0.7–4.0)
Lymphocytes Relative: 9 %
MCH: 22.9 pg — AB (ref 26.0–34.0)
MCHC: 31.5 g/dL (ref 30.0–36.0)
MCV: 72.7 fL — ABNORMAL LOW (ref 78.0–100.0)
MONO ABS: 1 10*3/uL (ref 0.1–1.0)
MONOS PCT: 8 %
NEUTROS PCT: 82 %
Neutro Abs: 10.4 10*3/uL — ABNORMAL HIGH (ref 1.7–7.7)
Platelets: 247 10*3/uL (ref 150–400)
RBC: 4.76 MIL/uL (ref 3.87–5.11)
RDW: 15.7 % — AB (ref 11.5–15.5)
WBC: 12.6 10*3/uL — ABNORMAL HIGH (ref 4.0–10.5)

## 2017-04-02 LAB — I-STAT CG4 LACTIC ACID, ED: Lactic Acid, Venous: 1.2 mmol/L (ref 0.5–1.9)

## 2017-04-02 MED ORDER — DEXTROSE 5 % IV SOLN
1.0000 g | Freq: Once | INTRAVENOUS | Status: AC
  Administered 2017-04-02: 1 g via INTRAVENOUS
  Filled 2017-04-02: qty 10

## 2017-04-02 MED ORDER — ACETAMINOPHEN 325 MG PO TABS: 650.0000 mg | ORAL_TABLET | Freq: Four times a day (QID) | ORAL | 0 refills | Status: DC | PRN

## 2017-04-02 MED ORDER — SODIUM CHLORIDE 0.9 % IV BOLUS (SEPSIS)
500.0000 mL | Freq: Once | INTRAVENOUS | Status: AC
  Administered 2017-04-02: 500 mL via INTRAVENOUS

## 2017-04-02 MED ORDER — IBUPROFEN 800 MG PO TABS
800.0000 mg | ORAL_TABLET | Freq: Once | ORAL | Status: AC
  Administered 2017-04-02: 800 mg via ORAL
  Filled 2017-04-02: qty 1

## 2017-04-02 MED ORDER — AZITHROMYCIN 250 MG PO TABS
500.0000 mg | ORAL_TABLET | Freq: Once | ORAL | Status: AC
  Administered 2017-04-02: 500 mg via ORAL
  Filled 2017-04-02: qty 2

## 2017-04-02 MED ORDER — LEVOFLOXACIN 750 MG PO TABS: 750.0000 mg | ORAL_TABLET | Freq: Every day | ORAL | 0 refills | Status: DC

## 2017-04-02 MED ORDER — ACETAMINOPHEN 325 MG PO TABS
650.0000 mg | ORAL_TABLET | Freq: Once | ORAL | Status: AC
  Administered 2017-04-02: 650 mg via ORAL
  Filled 2017-04-02: qty 2

## 2017-04-02 NOTE — ED Notes (Signed)
Called [facility] 

## 2017-04-02 NOTE — ED Provider Notes (Signed)
[facility]PT Provider Note   CSN: [id number] Arrival date & time: 04/02/17  1209     History   Chief Complaint Chief Complaint  Patient presents with  . Fatigue    HPI [name] is a 31 y.o. female.  [name] is a 31 y.o. Female with a history of spinal cord injury and quadriplegia and history of UTIs who presents to the emergency department complaining of fatigue, subjective fevers, cough, body aches and feelings of urinary tract infection. Patient reports her symptoms began approximately 3 days ago with fevers, coughing and body aches. Patient also reports today she felt she might have a urinary tract infection and has some fullness to her suprapubic region. She does report a history of several urinary tract infections. She reports subjective fevers at home. No antipyretics or treatments attempted prior to arrival today. She denies vomiting, diarrhea, rashes, shortness of breath, chest pain, trouble swallowing, neck pain.    The history is provided by the patient and medical records. No language interpreter was used.    Past Medical History:  Diagnosis Date  . Abnormal Pap smear   . Anemia   . ASCUS (atypical squamous cells of undetermined significance) on Pap smear 10/2011  . Asthma Mild, only with exposure to cigarette smoke  . Autonomic dysreflexia   . Congenital absence of right kidney   . H/O scoliosis   . Horseshoe kidney   . Hx: UTI (urinary tract infection) 11/27/11  . Recurrent nephrolithiasis    s/p stent  . Recurrent UTI requires self catheterization  . Sciatica   . Spinal cord injury, cervical region (HCC) C4-C5  . Urinary tract infection     Patient Active Problem List   Diagnosis Date Noted  . Oligomenorrhea 05/19/2012    Past Surgical History:  Procedure Laterality Date  . BACK SURGERY  Scoliosis, Harrington Rods in place  . Bilateral leg surgery    . C3-6 spine surgery with rods    . dvt filter placement    . LITHOTRIPSY    .  ORTHOPEDIC SURGERY  Multiple lower extremity surgeries as a child  . VASCULAR SURGERY  IVC filter placed in 2005    OB History    Gravida Para Term Preterm AB Living   0             SAB TAB Ectopic Multiple Live Births                   Home Medications    Prior to Admission medications   Medication Sig Start Date End Date Taking? Authorizing Provider  albuterol (PROVENTIL HFA;VENTOLIN HFA) 108 (90 BASE) MCG/ACT inhaler Inhale 2 puffs into the lungs every 4 (four) hours as needed for wheezing or shortness of breath.    Yes Historical Provider, MD  diazepam (VALIUM) 5 MG tablet Take 10 mg by mouth every 12 (twelve) hours. 12/03/16  Yes Historical Provider, MD  DM-GG-PE & APAP-Diphenhyd-PE (MUCINEX FAST-MAX DAY/NIGHT M/S) (Tablet) MISC Take 2 tablets by mouth every 6 (six) hours as needed (for cold).   Yes Historical Provider, MD  guaifenesin (ROBITUSSIN) 100 MG/5ML syrup Take 200 mg by mouth every 6 (six) hours as needed for cough.   Yes Historical Provider, MD  HYDROmorphone (DILAUDID) 4 MG tablet Take 4 mg by mouth every 6 (six) hours.    Yes Historical Provider, MD  LYRICA 50 MG capsule Take 50 mg by mouth 3 (three) times daily. 03/24/17  Yes Historical Provider, MD  nitrofurantoin (  MACRODANTIN) 100 MG capsule Take 100 mg by mouth at bedtime.    Yes Historical Provider, MD  promethazine (PHENERGAN) 25 MG tablet Take 25 mg by mouth every 8 (eight) hours as needed for nausea.   Yes Historical Provider, MD  solifenacin (VESICARE) 10 MG tablet Take 10 mg by mouth every morning.    Yes Historical Provider, MD  Throat Lozenges (COUGH DROPS MENTHOL) LOZG Use as directed 1 lozenge in the mouth or throat as needed (for cough/sore throat).   Yes Historical Provider, MD  zolpidem (AMBIEN) 10 MG tablet Take 10 mg by mouth at bedtime.    Yes Historical Provider, MD  acetaminophen (TYLENOL) 325 MG tablet Take 2 tablets (650 mg total) by mouth every 6 (six) hours as needed for mild pain, moderate pain  or fever. 04/02/17   Everlene Farrier, PA-C  levofloxacin (LEVAQUIN) 750 MG tablet Take 1 tablet (750 mg total) by mouth daily. 04/02/17   Everlene Farrier, PA-C    Family History Family History  Problem Relation Age of Onset  . Heart disease Mother     Massive MI in 2005/05/07 ; died    Social History Social History  Substance Use Topics  . Smoking status: Never Smoker  . Smokeless tobacco: Never Used  . Alcohol use No     Allergies   Lactose; Bactrim [sulfamethoxazole-trimethoprim]; Gabapentin; Metoclopramide hcl; Benadryl [diphenhydramine hcl]; and Morphine   Review of Systems Review of Systems  Constitutional: Positive for fatigue and fever (Subjective fever).  HENT: Positive for rhinorrhea. Negative for congestion and sore throat.   Eyes: Negative for visual disturbance.  Respiratory: Positive for cough. Negative for shortness of breath and wheezing.   Cardiovascular: Negative for chest pain.  Gastrointestinal: Negative for abdominal pain, diarrhea, nausea and vomiting.  Genitourinary: Negative for genital sores and hematuria.  Musculoskeletal: Negative for back pain and neck pain.  Skin: Negative for rash.  Neurological: Negative for headaches.     Physical Exam Updated Vital Signs BP (!) 85/44 (BP Location: Right Arm)   Pulse 82   Temp 99.2 F (37.3 C) (Oral)   Resp 18   LMP 03/02/2017   SpO2 100%   Physical Exam  Constitutional: She is oriented to person, place, and time. She appears well-developed and well-nourished. No distress.  Nontoxic appearing.  HENT:  Head: Normocephalic and atraumatic.  Mouth/Throat: Oropharynx is clear and moist.  Eyes: Conjunctivae are normal. Pupils are equal, round, and reactive to light. Right eye exhibits no discharge. Left eye exhibits no discharge.  Neck: Neck supple.  Cardiovascular: Normal rate, regular rhythm, normal heart sounds and intact distal pulses.   Pulmonary/Chest: Effort normal. No respiratory distress. She has no  wheezes. She has no rales.  Lung sounds are slightly diminished her bilateral bases. No increased work of breathing. No rales or rhonchi.  Abdominal: Soft. Bowel sounds are normal. She exhibits no distension. There is tenderness. There is no guarding.  Mild suprapubic tenderness to palpation. No peritoneal signs. Abdomen is soft.  Musculoskeletal: She exhibits no edema.  Lymphadenopathy:    She has no cervical adenopathy.  Neurological: She is alert and oriented to person, place, and time. Coordination normal.  Skin: Skin is warm and dry. Capillary refill takes less than 2 seconds. No rash noted. She is not diaphoretic. No erythema. No pallor.  Psychiatric: She has a normal mood and affect. Her behavior is normal.  Nursing note and vitals reviewed.    ED Treatments / Results  Labs (all labs ordered  are listed, but only abnormal results are displayed) Labs Reviewed  COMPREHENSIVE METABOLIC PANEL - Abnormal; Notable for the following:       Result Value   Chloride 99 (*)    Glucose, Bld 111 (*)    Calcium 8.6 (*)    Total Protein 8.8 (*)    ALT 12 (*)    All other components within normal limits  CBC WITH DIFFERENTIAL/PLATELET - Abnormal; Notable for the following:    WBC 12.6 (*)    Hemoglobin 10.9 (*)    HCT 34.6 (*)    MCV 72.7 (*)    MCH 22.9 (*)    RDW 15.7 (*)    Neutro Abs 10.4 (*)    All other components within normal limits  URINALYSIS, ROUTINE W REFLEX MICROSCOPIC - Abnormal; Notable for the following:    Color, Urine AMBER (*)    APPearance CLOUDY (*)    Hgb urine dipstick MODERATE (*)    Protein, ur 30 (*)    Nitrite POSITIVE (*)    Leukocytes, UA LARGE (*)    Bacteria, UA MANY (*)    Squamous Epithelial / LPF 0-5 (*)    All other components within normal limits  URINE CULTURE  INFLUENZA PANEL BY PCR (TYPE A & B)  I-STAT CG4 LACTIC ACID, ED  I-STAT CG4 LACTIC ACID, ED    EKG  EKG Interpretation None       Radiology Dg Chest 2 View  Result Date:  04/02/2017 CLINICAL DATA:  Cough, fever. EXAM: CHEST  2 VIEW COMPARISON:  Radiographs of August 10, 2011. FINDINGS: Stable cardiomediastinal silhouette. Status post surgical fusion of thoracic spine with moderate levoscoliosis of upper thoracic spine. No pneumothorax is noted. Right lung is clear. Mild left perihilar opacity is noted concerning for possible pneumonia. No significant pleural effusion is noted. IMPRESSION: Mild left perihilar opacity is noted concerning for possible pneumonia. Electronically Signed   By: Lupita Raider, M.D.   On: 04/02/2017 15:05    Procedures Procedures (including critical care time)  Medications Ordered in ED Medications  sodium chloride 0.9 % bolus 500 mL (0 mLs Intravenous Stopped 04/02/17 1530)  acetaminophen (TYLENOL) tablet 650 mg (650 mg Oral Given 04/02/17 1431)  cefTRIAXone (ROCEPHIN) 1 g in dextrose 5 % 50 mL IVPB (0 g Intravenous Stopped 04/02/17 1611)  ibuprofen (ADVIL,MOTRIN) tablet 800 mg (800 mg Oral Given 04/02/17 1541)  azithromycin (ZITHROMAX) tablet 500 mg (500 mg Oral Given 04/02/17 1541)  sodium chloride 0.9 % bolus 500 mL (500 mLs Intravenous New Bag/Given 04/02/17 1658)     Initial Impression / Assessment and Plan / ED Course  I have reviewed the triage vital signs and the nursing notes.  Pertinent labs & imaging results that were available during my care of the patient were reviewed by me and considered in my medical decision making (see chart for details).    This is a 31 y.o. Female with a history of spinal cord injury and quadriplegia and history of UTIs who presents to the emergency department complaining of fatigue, subjective fevers, cough, body aches and feelings of urinary tract infection. Patient reports her symptoms began approximately 3 days ago with fevers, coughing and body aches. Patient also reports today she felt she might have a urinary tract infection and has some fullness to her suprapubic region. She does report a history  of several urinary tract infections. She reports subjective fevers at home. No antipyretics or treatments attempted prior to arrival today.  On  exam the patient is nontoxic appearing. She has a temperature 100.6 on arrival to the emergency department. Blood pressure is 93/70. Patient reports systolic blood pressure over 70 is normal for her. Slightly diminished lung sounds are bilateral bases. No increased work of breathing. Urinalysis is nitrite positive with large leukocytes. Urine sent for culture. Most recent urine culture grew out Escherichia coli which was pan positive antibiotics. Influenza screening is negative. Lactic acid is within normal limits. CBC is remarkable for white count of 12,600. Chest x-ray shows mild left perihilar opacity concerning for possible pneumonia. He received a gram Rocephin and azithromycin in the emergency department.  At reevaluation patient reports she is feeling much better. Fever has resolved. She reassures me that blood pressures over 70 systolic are typical for her. She feels ready for discharge. We'll discharge with Levaquin due to possible pneumonia and urinary tract infection.  I discussed strict and specific return precautions.  I advised the patient to follow-up with their primary care provider this week. I advised the patient to return to the emergency department with new or worsening symptoms or new concerns. The patient verbalized understanding and agreement with plan.    This patient was discussed with Dr. Rush Landmark who agrees with assessment and plan.    Final diagnoses:  Community acquired pneumonia of left lower lobe of lung (HCC)  Acute cystitis with hematuria  History of spinal cord injury    New Prescriptions New Prescriptions   ACETAMINOPHEN (TYLENOL) 325 MG TABLET    Take 2 tablets (650 mg total) by mouth every 6 (six) hours as needed for mild pain, moderate pain or fever.   LEVOFLOXACIN (LEVAQUIN) 750 MG TABLET    Take 1 tablet (750 mg  total) by mouth daily.     Everlene Farrier, PA-C 04/02/17 1717    Canary Brim Tegeler, MD 04/02/17 [phone]

## 2017-04-02 NOTE — ED Triage Notes (Signed)
Per [facility] pt from home , feeling lethargic. abd pain . Hx UTI. Also reports chest congestion, cold symptoms x 1 week. Cough . Alert and oriented x 4. quadriplegic from spinal injury 13 years ago.

## 2017-04-02 NOTE — ED Notes (Signed)
Bed: JJ88 Expected date:  Expected time:  Means of arrival:  Comments: quadrapalegic, not feeling well

## 2017-04-04 LAB — URINE CULTURE

## 2017-04-05 ENCOUNTER — Telehealth: Payer: Self-pay

## 2017-04-05 NOTE — Telephone Encounter (Signed)
Post ED Visit - Positive Culture Follow-up  Culture report reviewed by antimicrobial stewardship pharmacist:   Enzo Bi, Pharm.D.  Celedonio Miyamoto, Pharm.D., BCPS AQ-ID  Garvin Fila, Pharm.D., BCPS  Georgina Pillion, Pharm.D., BCPS  [place], [address].D., BCPS, AAHIVP  Estella Husk, Pharm.D., BCPS, AAHIVP  Lysle Pearl, PharmD, BCPS  Casilda Carls, PharmD, BCPS  Pollyann Samples, PharmD, BCPS  Positive urine culture Treated with Levofloxacin, organism sensitive to the same and no further patient follow-up is required at this time.  Jerry Caras 04/05/2017, 9:33 AM

## 2017-04-23 ENCOUNTER — Other Ambulatory Visit: Payer: Self-pay | Admitting: Urology

## 2017-04-24 ENCOUNTER — Other Ambulatory Visit: Payer: Self-pay | Admitting: Urology

## 2017-05-07 ENCOUNTER — Encounter (HOSPITAL_BASED_OUTPATIENT_CLINIC_OR_DEPARTMENT_OTHER): Payer: Self-pay | Admitting: *Deleted

## 2017-05-07 NOTE — Progress Notes (Signed)
[facility],urine pregnancy on arrival-Pt states fiance transfers her at home by lifting.Aware he will need to transfer her while at the surgery center,since equipment and staff not available Npo after Mn-solids,clear liquids( no dairy,pulp) until 0530,then Npo.Will take her pain med,valium with water in am-asked to bring albuterol inhaler.Much discussion about removing piercings, in particular her tongue piercing.Made clear both metal or plastic will be required per Anesthesia to be removed from tongue.Explained the safety reasons for doing this.States understands,but requests to have replaced as soon as possible after awake before opening closes.

## 2017-05-14 ENCOUNTER — Other Ambulatory Visit: Payer: Self-pay | Admitting: Urology

## 2017-05-14 NOTE — H&P (Signed)
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  She does have a history of urinary infections. She has had 4 UTIs in the past year. 4 infections required hospitalization. The following are the medication(s) that have been tried: cipro, macrobid, and levaquin.   She has had a kidney stone. She feels that she does empty her bladder. She has not gone through menopause. She has not been on hormone replacement therapy.   09/02/16: In 8/17 she was having what she described as on and off kidney pains. She had a urinalysis done and was told her urine was clear but eventually developed what sounds like pyelonephritis by her history. It required hospitalization and then the placement of a PICC line for intravenous antibiotics as an outpatient for 2 weeks. She is doing better now but says she still may have some slight discomfort in the kidney. She was concerned about the possibility of a UTI.   Interval history 01/13/17: she has been noting what she describes as some spasms. She says that throughout the day she will drink water and when she gets catheterized there is a very low volume but later in the evening she will begin to have increasing volumes on catheterization. She reports drinking fluids and then after being catheterized she will feel the need to urinate about an hour later and will be catheterized for a similar amount of fluid. She is not having any symptoms to suggest UTI. She's been using nitrofurantoin for low-dose prophylaxis. She does find that the Vesicare helps with her symptoms of needing to urinate and will occasionally take it b.i.d.   Interval history 01/14/17: She has been maintained on low-dose nightly nitrofurantoin for prophylaxis.   Interval history 04/21/17: She has been in the hospital for pneumonia recently and also found to have UTI. That was treated. She is currently on Keflex for some form of dental infection and wanted to discuss  suprapubic tube placement.     ALLERGIES: Bactrim TABS Benadryl CAPS morphine Reglan TABS    MEDICATIONS: Augmentin 500 mg-125 mg tablet 1 tablet PO Q 12 H  Nitrofurantoin 100 mg capsule 1 capsule PO Q HS  Nitrofurantoin Mono-Macro 100 mg capsule TAKE ONE CAPSULE BY MOUTH EVERY 12 HOURS  Vesicare 10 mg tablet 1 tablet PO Daily  Dilaudid 4 MG Oral Tablet Oral  Lyrica  Nitrofurantoin Monohyd Macro 100 MG Oral Capsule 0 Oral  Phenergan 1 PO Daily  Protonix Iv 40 mg vial Intravenous  Valium 10 MG Oral Tablet Oral  Ventolin Hfa 90 mcg hfa aerosol with adapter Inhalation  Zoloft 25 mg tablet Oral     GU PSH: None     PSH Notes: Spinal Fixation By Wiring Of Processes   NON-GU PSH: Insert Spine Fixation Device - 2012    GU PMH: Chronic cystitis (w/o hematuria) (Stable), She has been doing well without recurrent infections on low-dose nitrofurantoin prophylaxis. I will refill her prescription and maintain her on this. - 01/13/2017, - 12/29/2016 (Stable), A catheterized urine specimen was obtained today and will be cultured. I will contact her with the culture results she remains on low-dose, suppressive nitrofurantoin nightly., - 09/02/2016, Chronic cystitis, - 09/24/2015 Areflexic bladder (Stable), I have discussed with her today that because she is being catheterized regularly, does not leak between catheterizations and is not having difficulty with recurrent UTIs I did not recommend converting her to a suprapubic tube. 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 Her husband performs intermittent self-catheterization 6 times per day.  She has had urodynamics which revealed a functional bladder capacity of 600 cc. Her volume with catheterizations runs between 200 and 400 cc with occasional volumes of 500 cc. She has no leakage in between catheterizations.  She does however have a sensation of urgency that has been managed with VESIcare. 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   Recurrent cystitis: She was having difficulty with recurrent infections. She had previously been managed with nightly prophylaxis using nitrofurantoin but was off of that. We reinitiated that therapy in 12/12. This is done very well at preventing recurrent infections. A renal ultrasound done in 6/13 revealed no evidence of mass, stones or hydronephrosis.   Nephrolithiasis: She has a history of nephrolithiasis. She has undergone lithotripsy in the past as well as placement of ureteral stent. No stones were noted on ultrasound in 6/13.    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.*     NON-GU PMH: Encounter for general adult medical examination without abnormal findings, Encounter for preventive health examination - 11/07/2014 Anxiety, Anxiety (Symptom) - 2014 Asthma, Asthma - 2014 Personal history of diseases of the blood and blood-forming organs and certain disorders involving the immune mechanism, History of anemia - 2014 Personal history of other endocrine, nutritional and metabolic disease, History of hyperkalemia - 2014 Personal history of other mental and behavioral disorders, History of depression - 2014 Quadriplegia, unspecified, Quadriparesis - 2014    FAMILY HISTORY: Cancer - Runs In Family Diabetes - Runs In  Family Heart Disease - Runs In Family   SOCIAL HISTORY: Marital Status: Single Current Smoking Status: Patient has never smoked.  Has never drank.  Drinks 1 caffeinated drink per day.     Notes: Caffeine Use, Never A Smoker, Occupation:, Marital History - Single, Drug Use, Tobacco Use, Alcohol Use   REVIEW OF SYSTEMS:    GU Review Female:   Patient denies frequent urination, hard to postpone urination, burning /pain with urination, get up at night to urinate, leakage of urine, stream starts and stops, trouble starting your stream, have to strain to urinate, and currently pregnant.  Gastrointestinal (Upper):   Patient denies nausea, vomiting, and indigestion/ heartburn.  Gastrointestinal (Lower):   Patient denies diarrhea and constipation.  Constitutional:   Patient denies fever, night sweats, weight loss, and fatigue.  Skin:   Patient denies skin rash/ lesion and itching.  Eyes:   Patient denies blurred vision and double vision.  Ears/ Nose/ Throat:   Patient denies sore throat and sinus problems.  Hematologic/Lymphatic:   Patient denies swollen glands and easy bruising.  Cardiovascular:   Patient denies chest pains and leg swelling.  Respiratory:   Patient denies cough and shortness of breath.  Endocrine:   Patient denies excessive thirst.  Musculoskeletal:   Patient denies back pain and joint pain.  Neurological:   Patient denies headaches and  dizziness.  Psychologic:   Patient denies depression and anxiety.   VITAL SIGNS:       Weight 90 lb / 40.82 kg  Height 57 in / 144.78 cm  BP 73/47 mmHg  Pulse 88 /min  Pulse Oximetry 96 %  BMI 19.5 kg/m   MULTI-SYSTEM PHYSICAL EXAMINATION:    Constitutional: Well-nourished. No physical deformities. Normally developed. Good grooming.  Thin. Neck: Neck symmetrical, not swollen. Normal tracheal position.  Respiratory: No labored breathing, no use of accessory muscles.   Cardiovascular: Normal temperature, normal extremity pulses, no  swelling, no varicosities.  Lymphatic: No enlargement of neck, axillae, groin.  Skin: No paleness, no jaundice, no cyanosis. No lesion, no ulcer, no rash.  Neurologic / Psychiatric: Oriented to time, oriented to place, oriented to person. No depression, no anxiety, no agitation.  Gastrointestinal: No mass, no tenderness, no rigidity, non obese abdomen.  Eyes: Normal conjunctivae. Normal eyelids.  Ears, Nose, Mouth, and Throat: Left ear no scars, no lesions, no masses. Right ear no scars, no lesions, no masses. Nose no scars, no lesions, no masses. Normal hearing. Normal lips.  Musculoskeletal: Normal station of head and neck.  She is wheelchair-bound with lower and some upper extremity muscle atrophy.  She does have use of her upper extremities.   PAST DATA REVIEWED:  Source Of History:  Patient  Lab Test Review:   BUN/Creatinine, CBC with Diff  Records Review:   Previous Patient Records, POC Tool  Urine Test Review:   Urine Culture  Notes:                     Her creatinine on 04/02/17 was 0.63. Her white blood cell count was 12.6 and her urine culture grew pansensitive E. coli.   PROCEDURES: None   ASSESSMENT/PLAN:      ICD-10 Details  1 GU:   Chronic cystitis (w/o hematuria) - N30.20 Stable - She is currently on Keflex. She is going to get started back on her nitrofurantoin prophylaxis when she is off of that. She also has found an over-the-counter Azo-UTI defense medication that she wants to try as well. I told her I thought that was reasonable.  2   Bladder, Neuromuscular dysfunction, Unspec - N31.9 Stable - She wanted to consider placement of the suprapubic tube we had discussed this at her last visit. She does understand this carries no less risk of development of a UTI and a urethral catheter. She understands that it can be removed at any time if she is unhappy with no longer. She would like to proceed with SP tube placement and I went over the procedure with her in detail including the  risks and palpitations, the outpatient nature of the procedure and anticipated postoperative course. She came to the office 10 days before the procedure for a catheterized specimen for urine culture which was found to be negative.

## 2017-05-15 ENCOUNTER — Encounter (HOSPITAL_BASED_OUTPATIENT_CLINIC_OR_DEPARTMENT_OTHER)
Admission: RE | Disposition: A | Discharge status: Home / Self Care | Payer: Self-pay | Source: Ambulatory Visit | Attending: Urology

## 2017-05-15 ENCOUNTER — Encounter (HOSPITAL_BASED_OUTPATIENT_CLINIC_OR_DEPARTMENT_OTHER): Payer: Self-pay

## 2017-05-15 ENCOUNTER — Ambulatory Visit (HOSPITAL_BASED_OUTPATIENT_CLINIC_OR_DEPARTMENT_OTHER): Payer: Medicare Other | Admitting: Anesthesiology

## 2017-05-15 ENCOUNTER — Ambulatory Visit (HOSPITAL_BASED_OUTPATIENT_CLINIC_OR_DEPARTMENT_OTHER)
Admission: RE | Admit: 2017-05-15 | Discharge: 2017-05-15 | Disposition: A | Discharge status: Home / Self Care | Payer: Medicare Other | Source: Ambulatory Visit | Attending: Urology | Admitting: Urology

## 2017-05-15 DIAGNOSIS — N312 Flaccid neuropathic bladder, not elsewhere classified: Secondary | ICD-10-CM | POA: Diagnosis not present

## 2017-05-15 DIAGNOSIS — G8250 Quadriplegia, unspecified: Secondary | ICD-10-CM | POA: Diagnosis not present

## 2017-05-15 DIAGNOSIS — F419 Anxiety disorder, unspecified: Secondary | ICD-10-CM | POA: Diagnosis not present

## 2017-05-15 DIAGNOSIS — Z885 Allergy status to narcotic agent status: Secondary | ICD-10-CM | POA: Insufficient documentation

## 2017-05-15 DIAGNOSIS — S14109S Unspecified injury at unspecified level of cervical spinal cord, sequela: Secondary | ICD-10-CM | POA: Diagnosis not present

## 2017-05-15 DIAGNOSIS — J45909 Unspecified asthma, uncomplicated: Secondary | ICD-10-CM | POA: Diagnosis not present

## 2017-05-15 DIAGNOSIS — Z881 Allergy status to other antibiotic agents status: Secondary | ICD-10-CM | POA: Diagnosis not present

## 2017-05-15 DIAGNOSIS — Z91048 Other nonmedicinal substance allergy status: Secondary | ICD-10-CM | POA: Insufficient documentation

## 2017-05-15 DIAGNOSIS — Z780 Asymptomatic menopausal state: Secondary | ICD-10-CM | POA: Insufficient documentation

## 2017-05-15 HISTORY — PX: INSERTION OF SUPRAPUBIC CATHETER: SHX5870

## 2017-05-15 HISTORY — DX: Pressure ulcer of sacral region, stage 1: L89.151

## 2017-05-15 HISTORY — DX: Other chronic pain: G89.29

## 2017-05-15 HISTORY — DX: Pressure ulcer of unspecified buttock, stage 2: L89.302

## 2017-05-15 LAB — POCT HEMOGLOBIN-HEMACUE: Hemoglobin: 11 g/dL — ABNORMAL LOW (ref 12.0–15.0)

## 2017-05-15 SURGERY — INSERTION, SUPRAPUBIC CATHETER
Anesthesia: General | Site: Bladder

## 2017-05-15 MED ORDER — PROPOFOL 10 MG/ML IV BOLUS
INTRAVENOUS | Status: AC
  Filled 2017-05-15: qty 20

## 2017-05-15 MED ORDER — ONDANSETRON HCL 4 MG/2ML IJ SOLN
INTRAMUSCULAR | Status: DC | PRN
  Administered 2017-05-15: 4 mg via INTRAVENOUS

## 2017-05-15 MED ORDER — MIDAZOLAM HCL 5 MG/5ML IJ SOLN
INTRAMUSCULAR | Status: DC | PRN
  Administered 2017-05-15: 1 mg via INTRAVENOUS

## 2017-05-15 MED ORDER — LACTATED RINGERS IV SOLN
INTRAVENOUS | Status: DC
  Administered 2017-05-15: 11:00:00 via INTRAVENOUS
  Filled 2017-05-15: qty 1000

## 2017-05-15 MED ORDER — FENTANYL CITRATE (PF) 100 MCG/2ML IJ SOLN
25.0000 ug | INTRAMUSCULAR | Status: DC | PRN
  Administered 2017-05-15: 25 ug via INTRAVENOUS
  Administered 2017-05-15: 50 ug via INTRAVENOUS
  Administered 2017-05-15: 25 ug via INTRAVENOUS
  Filled 2017-05-15: qty 1

## 2017-05-15 MED ORDER — OXYCODONE HCL 5 MG PO TABS
5.0000 mg | ORAL_TABLET | Freq: Once | ORAL | Status: AC | PRN
  Administered 2017-05-15: 5 mg via ORAL
  Filled 2017-05-15: qty 1

## 2017-05-15 MED ORDER — ONDANSETRON HCL 4 MG/2ML IJ SOLN
4.0000 mg | Freq: Four times a day (QID) | INTRAMUSCULAR | Status: DC | PRN
  Filled 2017-05-15: qty 2

## 2017-05-15 MED ORDER — EPHEDRINE SULFATE 50 MG/ML IJ SOLN
INTRAMUSCULAR | Status: DC | PRN
  Administered 2017-05-15: 5 mg via INTRAVENOUS

## 2017-05-15 MED ORDER — PROPOFOL 10 MG/ML IV BOLUS
INTRAVENOUS | Status: DC | PRN
  Administered 2017-05-15: 100 mg via INTRAVENOUS

## 2017-05-15 MED ORDER — LIDOCAINE 2% (20 MG/ML) 5 ML SYRINGE
INTRAMUSCULAR | Status: AC
  Filled 2017-05-15: qty 5

## 2017-05-15 MED ORDER — FENTANYL CITRATE (PF) 100 MCG/2ML IJ SOLN
INTRAMUSCULAR | Status: DC | PRN
  Administered 2017-05-15 (×2): 25 ug via INTRAVENOUS
  Administered 2017-05-15: 50 ug via INTRAVENOUS

## 2017-05-15 MED ORDER — STERILE WATER FOR IRRIGATION IR SOLN
Status: DC | PRN
  Administered 2017-05-15: 1

## 2017-05-15 MED ORDER — OXYCODONE HCL 5 MG PO TABS
ORAL_TABLET | ORAL | Status: AC
  Filled 2017-05-15: qty 1

## 2017-05-15 MED ORDER — SODIUM CHLORIDE 0.9 % IR SOLN
Status: DC | PRN
  Administered 2017-05-15: 1 via INTRAVESICAL

## 2017-05-15 MED ORDER — CEFAZOLIN SODIUM-DEXTROSE 2-4 GM/100ML-% IV SOLN
2.0000 g | Freq: Once | INTRAVENOUS | Status: AC
  Administered 2017-05-15: 2 g via INTRAVENOUS
  Filled 2017-05-15: qty 100

## 2017-05-15 MED ORDER — OXYCODONE HCL 5 MG/5ML PO SOLN
5.0000 mg | Freq: Once | ORAL | Status: AC | PRN
  Filled 2017-05-15: qty 5

## 2017-05-15 MED ORDER — MIDAZOLAM HCL 2 MG/2ML IJ SOLN
INTRAMUSCULAR | Status: AC
  Filled 2017-05-15: qty 2

## 2017-05-15 MED ORDER — LIDOCAINE 2% (20 MG/ML) 5 ML SYRINGE
INTRAMUSCULAR | Status: DC | PRN
  Administered 2017-05-15: 50 mg via INTRAVENOUS

## 2017-05-15 MED ORDER — FENTANYL CITRATE (PF) 100 MCG/2ML IJ SOLN
INTRAMUSCULAR | Status: AC
  Filled 2017-05-15: qty 2

## 2017-05-15 MED ORDER — ONDANSETRON HCL 4 MG/2ML IJ SOLN
INTRAMUSCULAR | Status: AC
  Filled 2017-05-15: qty 2

## 2017-05-15 MED ORDER — OXYCODONE HCL 10 MG PO TABS: 10.0000 mg | ORAL_TABLET | ORAL | 0 refills | Status: DC | PRN

## 2017-05-15 MED ORDER — CEFAZOLIN SODIUM-DEXTROSE 2-4 GM/100ML-% IV SOLN
INTRAVENOUS | Status: AC
  Filled 2017-05-15: qty 100

## 2017-05-15 SURGICAL SUPPLY — 29 items
BAG DRAIN URO-CYSTO SKYTR STRL (DRAIN) ×2 IMPLANT
BAG URINE DRAINAGE (UROLOGICAL SUPPLIES) IMPLANT
BAG URINE LEG 19OZ MD ST LTX (BAG) IMPLANT
BLADE CLIPPER SURG (BLADE) ×2 IMPLANT
CATH BONANNO SUPRAPUBIC 14G (CATHETERS) IMPLANT
CATH FOLEY 2WAY SLVR  5CC 16FR (CATHETERS) ×1
CATH FOLEY 2WAY SLVR  5CC 18FR (CATHETERS)
CATH FOLEY 2WAY SLVR 5CC 16FR (CATHETERS) ×1 IMPLANT
CATH FOLEY 2WAY SLVR 5CC 18FR (CATHETERS) IMPLANT
CATH FOLEY INTRO SUPRA 16F (CATHETERS) ×2 IMPLANT
CLOTH BEACON ORANGE TIMEOUT ST (SAFETY) ×2 IMPLANT
ELECT REM PT RETURN 9FT ADLT (ELECTROSURGICAL)
ELECTRODE REM PT RTRN 9FT ADLT (ELECTROSURGICAL) IMPLANT
GLOVE BIO SURGEON STRL SZ8 (GLOVE) ×2 IMPLANT
GOWN STRL REUS W/ TWL XL LVL3 (GOWN DISPOSABLE) ×1 IMPLANT
GOWN STRL REUS W/TWL XL LVL3 (GOWN DISPOSABLE) ×1
HOLDER FOLEY CATH W/STRAP (MISCELLANEOUS) IMPLANT
IV NS IRRIG 3000ML ARTHROMATIC (IV SOLUTION) IMPLANT
KIT RM TURNOVER CYSTO AR (KITS) ×2 IMPLANT
KIT SUPRAPUBIC CATH (MISCELLANEOUS) IMPLANT
MANIFOLD NEPTUNE II (INSTRUMENTS) ×2 IMPLANT
NEEDLE HYPO 25X1 1.5 SAFETY (NEEDLE) ×2 IMPLANT
PACK CYSTO (CUSTOM PROCEDURE TRAY) ×2 IMPLANT
PENCIL BUTTON HOLSTER BLD 10FT (ELECTRODE) IMPLANT
SUT SILK 0 TIES 10X30 (SUTURE) ×2 IMPLANT
SUT SILK 2 0 FS (SUTURE) IMPLANT
SYRINGE IRR TOOMEY STRL 70CC (SYRINGE) IMPLANT
TUBE CONNECTING 12X1/4 (SUCTIONS) ×2 IMPLANT
WATER STERILE IRR 3000ML UROMA (IV SOLUTION) ×2 IMPLANT

## 2017-05-15 NOTE — Anesthesia Preprocedure Evaluation (Signed)
Anesthesia Evaluation  Patient identified by MRN, date of birth, ID band Patient awake    Reviewed: Allergy & Precautions, H&P , NPO status , Patient's Chart, lab work & pertinent test results  Airway Mallampati: II   Neck ROM: full    Dental   Pulmonary asthma ,    breath sounds clear to auscultation       Cardiovascular negative cardio ROS   Rhythm:regular Rate:Normal     Neuro/Psych H/o C-spinal cord injury 2005.  Neuromuscular disease    GI/Hepatic   Endo/Other    Renal/GU Renal disease     Musculoskeletal   Abdominal   Peds  Hematology   Anesthesia Other Findings   Reproductive/Obstetrics                             Anesthesia Physical Anesthesia Plan  ASA: III  Anesthesia Plan: General   Post-op Pain Management:    Induction: Intravenous  Airway Management Planned: LMA  Additional Equipment:   Intra-op Plan:   Post-operative Plan:   Informed Consent: I have reviewed the patients History and Physical, chart, labs and discussed the procedure including the risks, benefits and alternatives for the proposed anesthesia with the patient or authorized representative who has indicated his/her understanding and acceptance.     Plan Discussed with: CRNA, Anesthesiologist and Surgeon  Anesthesia Plan Comments:         Anesthesia Quick Evaluation

## 2017-05-15 NOTE — Transfer of Care (Signed)
Immediate Anesthesia Transfer of Care Note  Patient: Stacy CraftBritany D Selden  Procedure(s) Performed: Procedure(s): INSERTION OF SUPRAPUBIC CATHETER (N/A)  Patient Location: PACU  Anesthesia Type:General  Level of Consciousness: awake, alert  and oriented  Airway & Oxygen Therapy: Patient Spontanous Breathing and Patient connected to nasal cannula oxygen  Post-op Assessment: Report given to RN  Post vital signs: Reviewed and stable  Last Vitals: 138/92, 85, 16, 100% Vitals:   05/15/17 0957  BP: 108/65  Pulse: 73  Resp: 16  Temp: 36.9 C    Last Pain:  Vitals:   05/15/17 1014  TempSrc:   PainSc: 10-Worst pain ever         Complications: No apparent anesthesia complications

## 2017-05-15 NOTE — Discharge Instructions (Signed)
Call your surgeon if you experience:   1.  Fever over 101.0. 2.  Inability to urinate. 3.  Nausea and/or vomiting. 4.  Extreme swelling or bruising at the surgical site. 5.  Continued bleeding from the incision. 6.  Increased pain, redness or drainage from the incision. 7.  Problems related to your pain medication. 8.  Any problems and/or concerns Post Anesthesia Home Care Instructions  Activity: Get plenty of rest for the remainder of the day. A responsible individual must stay with you for 24 hours following the procedure.  For the next 24 hours, DO NOT: -Drive a car -Advertising copywriter -Drink alcoholic beverages -Take any medication unless instructed by your physician -Make any legal decisions or sign important papers.  Meals: Start with liquid foods such as gelatin or soup. Progress to regular foods as tolerated. Avoid greasy, spicy, heavy foods. If nausea and/or vomiting occur, drink only clear liquids until the nausea and/or vomiting subsides. Call your physician if vomiting continues.  Special Instructions/Symptoms: Your throat may feel dry or sore from the anesthesia or the breathing tube placed in your throat during surgery. If this causes discomfort, gargle with warm salt water. The discomfort should disappear within 24 hours.  If you had a scopolamine patch placed behind your ear for the management of post- operative nausea and/or vomiting:  1. The medication in the patch is effective for 72 hours, after which it should be removed.  Wrap patch in a tissue and discard in the trash. Wash hands thoroughly with soap and water. 2. You may remove the patch earlier than 72 hours if you experience unpleasant side effects which may include dry mouth, dizziness or visual disturbances. 3. Avoid touching the patch. Wash your hands with soap and water after contact with the patch.   Suprapubic Catheter Home Guide A suprapubic catheter is a rubber tube used to drain urine from the  bladder into a collection bag. The catheter is inserted into the bladder through a small opening in the in the lower abdomen, near the center of the body, above the pubic bone (suprapubic area). There is a tiny balloon filled with germ-free (sterile) water on the end of the catheter that is in the bladder. The balloon helps to keep the catheter in place. Your suprapubic catheter may need to be replaced every 4-6 weeks, or as often as recommended by your health care provider. The collection bag must be emptied every day and cleaned every 2-3 days. The collection bag can be put beside your bed at night and attached to your leg during the day. You may have a large collection bag to use at night and a smaller one to use during the day. What are the risks?  Urine flow can become blocked. This can happen if the catheter is not working correctly, or if you have a blood clot in your bladder or in the catheter.  Tissue near the catheter may can become irritated and bleed.  Bacteria may get into your bladder and cause a urinary tract infection. How do I change the catheter? Supplies needed   Two pairs of sterile gloves.  Catheter.  Two syringes.  Sterile water.  Sterile cleaning solution.  Lubricant.  Collection bags. Changing the catheter  To replace your catheter, take the following steps: 1. Drink plenty of fluids during the hours before you plan to change the catheter. 2. Wash your hands with soap and water. If soap and water are not available, use hand sanitizer. 3. Lorenz Coaster  on your back and put on sterile gloves. 4. Clean the skin around the catheter opening using the sterile cleaning solution. 5. Remove the water from the balloon using a syringe. 6. Slowly remove the catheter.  Do not pull on the catheter if it seems stuck.  Call your health care provider immediately if you have difficulty removing the catheter. 7. Take off the used gloves, and put on a new pair. 8. Put lubricant on the  end of the new catheter that will go into your bladder. 9. Gently slide the catheter through the opening in your abdomen and into your bladder. 10. Wait for some urine to start flowing through the catheter. When urine starts to flow through the catheter, use a new syringe to fill the balloon with sterile water. 11. Attach the collection bag to the end of the catheter. Make sure the connection is tight. 12. Remove the gloves and wash your hands with soap and water. How do I care for my skin around the catheter? Use a clean washcloth and soapy water to clean the skin around your catheter every day. Pat the area dry with a clean towel.  Do not pull on the catheter.  Do not use ointment or lotion on this area unless told by your health care provider.  Check your skin around the catheter every day for signs of infection. Check for:  Redness, swelling, or pain.  Fluid or blood.  Warmth.  Pus or a bad smell. How do I clean and empty the collection bag? Clean the collection bag every 2-3 days, or as often as told by your health care provider. To do this, take the following steps:  Wash your hands with soap and water. If soap and water are not available, use hand sanitizer.  Disconnect the bag from the catheter and immediately attach a new bag to the catheter.  Empty the used bag completely.  Clean the used bag using one of the following methods:  Rinse the used bag with warm water and soap.  Fill the bag with water and add 1 tsp of vinegar. Let it sit for about 30 minutes, then empty the bag.  Let the bag dry completely, and put it in a clean plastic bag before storing it. 1.  Empty the large collection bag every 8 hours. Empty the small collection bag when it is about ? full. To empty your large or small collection bag, take the following steps:  Always keep the bag below the level of the catheter. This keeps urine from flowing backwards into the catheter.  Hold the bag over the  toilet or another container. Turn the valve (spigot) at the bottom of the bag to empty the urine.  Do not touch the opening of the spigot.  Do not let the opening touch the toilet or container.  Close the spigot tightly when the bag is empty. What are some general tips?  Always wash your hands before and after caring for your catheter and collection bag. Use a mild, fragrance-free soap. If soap and water are not available, use hand sanitizer.  Clean the catheter with soap and water as often as told by your health care provider.  Always make sure there are no twists or curls (kinks) in the catheter tube.  Always make sure there are no leaks in the catheter or collection bag.  Drink enough fluid to keep your urine clear or pale yellow.  Do not take baths, swim, or use a hot tub.  When should I seek medical care? Seek medical care if:  You leak urine.  You have redness, swelling, or pain around your catheter opening.  You have fluid or blood coming from your catheter opening.  Your catheter opening feels warm to the touch.  You have pus or a bad smell coming from your catheter opening.  You have a fever or chills.  Your urine flow slows down.  Your urine becomes cloudy or smelly. When should I seek immediate medical care? Seek immediate medical care if your catheter comes out, or if you have:  Nausea.  Back pain.  Difficulty changing your catheter.  Blood in your urine.  No urine flow for 1 hour. Marland Kitchen

## 2017-05-15 NOTE — Anesthesia Procedure Notes (Signed)
Procedure Name: LMA Insertion Date/Time: 05/15/2017 10:58 AM Performed by: Maris BergerENENNY, Alanny Rivers T Pre-anesthesia Checklist: Patient identified, Emergency Drugs available, Suction available and Patient being monitored Patient Re-evaluated:Patient Re-evaluated prior to inductionOxygen Delivery Method: Circle system utilized Preoxygenation: Pre-oxygenation with 100% oxygen Intubation Type: IV induction Ventilation: Mask ventilation without difficulty LMA: LMA inserted LMA Size: 3.0 Number of attempts: 1 Airway Equipment and Method: Bite block Placement Confirmation: positive ETCO2 Tube secured with: Tape Dental Injury: Teeth and Oropharynx as per pre-operative assessment

## 2017-05-15 NOTE — Op Note (Signed)
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 She has been managing her bladder with self-catheterization which has been performed by her boyfriend and she is interested in greater independence. We discussed suprapubic tube placement and she has elected to proceed with this. Preoperative culture was found to be negative.  ANESTHESIA:  General  EBL:  Minimal  DRAINS: 16 French Foley catheter as the suprapubic tube  LOCAL MEDICATIONS USED:  None  SPECIMEN:  None  Description of procedure: After informed consent the patient was taken to the operating room and placed on the table in a supine position. General anesthesia was then administered. Once fully anesthetized the patient was moved to the dorsal lithotomy position and the genitalia were sterilely prepped and draped in standard fashion. An official timeout was then performed.  Initially the 23 French cystoscope with 30 lens was introduced in the bladder. The bladder was noted to be normal in appearance with 1+ trabeculation. The right and left ureteral orifices were normal.  I switched to the 70 lens and filled the bladder to capacity. The patient was then placed in the Trendelenburg position and I was able to easily indent the dome of the bladder with a hemostat through the skin. This location was marked and a transverse incision was then made in this location and the trocar and suprapubic tube introducer was then passed through the skin incision and through the dome of the bladder under direct cystoscopic visualization. The trocar was removed and a 60 French Foley cath was then passed through the sheath of the trocar into the bladder and the balloon was filled with sterile water. The sheath of  the trocar was then removed and the catheter was secured to the skin with 2-0 silk suture. The bladder was fully drained, a catheter plug was inserted and the patient was awakened and taken recovery room in stable and satisfactory condition. She tolerated the procedure well no intraoperative complications.  PLAN OF CARE: Discharge to home after PACU  PATIENT DISPOSITION:  PACU - hemodynamically stable.

## 2017-05-15 NOTE — Anesthesia Postprocedure Evaluation (Signed)
Anesthesia Post Note  Patient: Stacy CraftBritany D Nabi  Procedure(s) Performed: Procedure(s) (LRB): INSERTION OF SUPRAPUBIC CATHETER (N/A)  Patient location during evaluation: PACU Anesthesia Type: General Level of consciousness: awake and alert and patient cooperative Pain management: pain level controlled Vital Signs Assessment: post-procedure vital signs reviewed and stable Respiratory status: spontaneous breathing and respiratory function stable Cardiovascular status: stable Anesthetic complications: no       Last Vitals:  Vitals:   05/15/17 1145 05/15/17 1200  BP: 126/83 116/71  Pulse: 80 74  Resp: 11 10  Temp:      Last Pain:  Vitals:   05/15/17 1200  TempSrc:   PainSc: 7                  Raiza Kiesel S

## 2017-05-17 ENCOUNTER — Emergency Department (HOSPITAL_COMMUNITY): Payer: Medicare Other

## 2017-05-17 ENCOUNTER — Encounter (HOSPITAL_COMMUNITY): Payer: Self-pay | Admitting: Emergency Medicine

## 2017-05-17 ENCOUNTER — Emergency Department (HOSPITAL_COMMUNITY)
Admission: EM | Admit: 2017-05-17 | Discharge: 2017-05-17 | Disposition: A | Discharge status: Home / Self Care | Payer: Medicare Other | Attending: Emergency Medicine | Admitting: Emergency Medicine

## 2017-05-17 DIAGNOSIS — N3090 Cystitis, unspecified without hematuria: Secondary | ICD-10-CM

## 2017-05-17 DIAGNOSIS — M8668 Other chronic osteomyelitis, other site: Secondary | ICD-10-CM | POA: Diagnosis not present

## 2017-05-17 DIAGNOSIS — N3000 Acute cystitis without hematuria: Secondary | ICD-10-CM | POA: Diagnosis not present

## 2017-05-17 DIAGNOSIS — Y658 Other specified misadventures during surgical and medical care: Secondary | ICD-10-CM | POA: Insufficient documentation

## 2017-05-17 DIAGNOSIS — T83020A Displacement of cystostomy catheter, initial encounter: Secondary | ICD-10-CM | POA: Diagnosis present

## 2017-05-17 LAB — CBC WITH DIFFERENTIAL/PLATELET
Basophils Absolute: 0 10*3/uL (ref 0.0–0.1)
Basophils Relative: 0 %
EOS ABS: 0.2 10*3/uL (ref 0.0–0.7)
EOS PCT: 3 %
HCT: 33.5 % — ABNORMAL LOW (ref 36.0–46.0)
Hemoglobin: 10.2 g/dL — ABNORMAL LOW (ref 12.0–15.0)
LYMPHS ABS: 1.9 10*3/uL (ref 0.7–4.0)
Lymphocytes Relative: 29 %
MCH: 23.4 pg — AB (ref 26.0–34.0)
MCHC: 30.4 g/dL (ref 30.0–36.0)
MCV: 76.8 fL — ABNORMAL LOW (ref 78.0–100.0)
MONO ABS: 0.5 10*3/uL (ref 0.1–1.0)
MONOS PCT: 7 %
NEUTROS PCT: 61 %
Neutro Abs: 4 10*3/uL (ref 1.7–7.7)
Platelets: 216 10*3/uL (ref 150–400)
RBC: 4.36 MIL/uL (ref 3.87–5.11)
RDW: 17.9 % — AB (ref 11.5–15.5)
WBC: 6.6 10*3/uL (ref 4.0–10.5)

## 2017-05-17 LAB — URINALYSIS, ROUTINE W REFLEX MICROSCOPIC
BILIRUBIN URINE: NEGATIVE
Bacteria, UA: NONE SEEN
GLUCOSE, UA: NEGATIVE mg/dL
HGB URINE DIPSTICK: NEGATIVE
Ketones, ur: NEGATIVE mg/dL
Leukocytes, UA: NEGATIVE
NITRITE: NEGATIVE
PH: 6 (ref 5.0–8.0)
Protein, ur: 100 mg/dL — AB
SPECIFIC GRAVITY, URINE: 1.028 (ref 1.005–1.030)

## 2017-05-17 LAB — I-STAT CHEM 8, ED
BUN: 18 mg/dL (ref 6–20)
Calcium, Ion: 1.11 mmol/L — ABNORMAL LOW (ref 1.15–1.40)
Chloride: 99 mmol/L — ABNORMAL LOW (ref 101–111)
Creatinine, Ser: 0.6 mg/dL (ref 0.44–1.00)
Glucose, Bld: 93 mg/dL (ref 65–99)
HCT: 34 % — ABNORMAL LOW (ref 36.0–46.0)
HEMOGLOBIN: 11.6 g/dL — AB (ref 12.0–15.0)
Potassium: 4.2 mmol/L (ref 3.5–5.1)
Sodium: 137 mmol/L (ref 135–145)
TCO2: 30 mmol/L (ref 0–100)

## 2017-05-17 LAB — POC URINE PREG, ED: Preg Test, Ur: NEGATIVE

## 2017-05-17 MED ORDER — IOPAMIDOL (ISOVUE-300) INJECTION 61%
INTRAVENOUS | Status: AC
  Filled 2017-05-17: qty 100

## 2017-05-17 MED ORDER — IOPAMIDOL (ISOVUE-300) INJECTION 61%
15.0000 mL | Freq: Once | INTRAVENOUS | Status: AC | PRN
  Administered 2017-05-17: 15 mL via ORAL

## 2017-05-17 MED ORDER — OXYCODONE-ACETAMINOPHEN 5-325 MG PO TABS
1.0000 | ORAL_TABLET | Freq: Once | ORAL | Status: AC
  Administered 2017-05-17: 1 via ORAL
  Filled 2017-05-17: qty 1

## 2017-05-17 MED ORDER — ONDANSETRON HCL 4 MG/2ML IJ SOLN
4.0000 mg | Freq: Once | INTRAMUSCULAR | Status: AC
  Administered 2017-05-17: 4 mg via INTRAVENOUS
  Filled 2017-05-17: qty 2

## 2017-05-17 MED ORDER — HYDROMORPHONE HCL 1 MG/ML IJ SOLN
1.0000 mg | Freq: Once | INTRAMUSCULAR | Status: AC
  Administered 2017-05-17: 1 mg via INTRAVENOUS
  Filled 2017-05-17: qty 1

## 2017-05-17 MED ORDER — FENTANYL CITRATE (PF) 100 MCG/2ML IJ SOLN
50.0000 ug | Freq: Once | INTRAMUSCULAR | Status: AC
  Administered 2017-05-17: 50 ug via INTRAVENOUS
  Filled 2017-05-17: qty 2

## 2017-05-17 MED ORDER — IOPAMIDOL (ISOVUE-300) INJECTION 61%
100.0000 mL | Freq: Once | INTRAVENOUS | Status: AC | PRN
  Administered 2017-05-17: 100 mL via INTRAVENOUS

## 2017-05-17 MED ORDER — OXYCODONE-ACETAMINOPHEN 5-325 MG PO TABS
ORAL_TABLET | ORAL | Status: AC
  Filled 2017-05-17: qty 1

## 2017-05-17 MED ORDER — FOSFOMYCIN TROMETHAMINE 3 G PO PACK
3.0000 g | PACK | Freq: Once | ORAL | Status: AC
  Administered 2017-05-17: 3 g via ORAL
  Filled 2017-05-17: qty 3

## 2017-05-17 NOTE — ED Triage Notes (Signed)
Followed up with communications regarding transport, pt was not on list to transport. Updated communications with pt information. Continue to wait for transport.

## 2017-05-17 NOTE — ED Triage Notes (Signed)
Brought in by EMS from home with c/o "issues with suprapubic cath or wound incision".   Pt reported that she has had the suprapubic cath inserted on the 25th, and when her boyfriend changed her today, her bottom sheet was soaked--- unsure if cath is leaking around stoma or it came from the incisional wound.  Pt here to seek "peace of mind as to where the drainage came from".

## 2017-05-17 NOTE — ED Notes (Signed)
Bed: [facility] Expected date:  Expected time:  Means of arrival:  Comments: 

## 2017-05-17 NOTE — ED Notes (Signed)
Attempted to bladder scan pt, but nothing resulted.

## 2017-05-17 NOTE — Discharge Instructions (Addendum)
Please see the Urologist as soon as possible.  As discussed, there is concerns for osteomyelitis. You have opted to be treated as an outpatient, so please contact the Orthopedic doctor for an appointment and also see the infectious disease doctor. Schedule the infectious disease doctor's appointment after seeing the orthopedic team.  Please return to the ER if your symptoms worsen; you have increased pain, fevers, chills, inability to keep any medications down, confusion. Otherwise see the outpatient doctor as requested.
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
## 2017-05-18 LAB — URINE CULTURE: CULTURE: NO GROWTH

## 2017-05-19 ENCOUNTER — Encounter (HOSPITAL_BASED_OUTPATIENT_CLINIC_OR_DEPARTMENT_OTHER): Payer: Self-pay | Admitting: Urology

## 2017-08-22 ENCOUNTER — Emergency Department (HOSPITAL_COMMUNITY): Payer: Medicare Other

## 2017-08-22 ENCOUNTER — Encounter (HOSPITAL_COMMUNITY): Payer: Self-pay | Admitting: *Deleted

## 2017-08-22 ENCOUNTER — Emergency Department (HOSPITAL_COMMUNITY)
Admission: EM | Admit: 2017-08-22 | Discharge: 2017-08-22 | Disposition: A | Discharge status: Home / Self Care | Payer: Medicare Other | Attending: Emergency Medicine | Admitting: Emergency Medicine

## 2017-08-22 DIAGNOSIS — Z79899 Other long term (current) drug therapy: Secondary | ICD-10-CM | POA: Insufficient documentation

## 2017-08-22 DIAGNOSIS — R110 Nausea: Secondary | ICD-10-CM | POA: Diagnosis not present

## 2017-08-22 DIAGNOSIS — J45909 Unspecified asthma, uncomplicated: Secondary | ICD-10-CM | POA: Diagnosis not present

## 2017-08-22 DIAGNOSIS — Z885 Allergy status to narcotic agent status: Secondary | ICD-10-CM | POA: Diagnosis not present

## 2017-08-22 DIAGNOSIS — J069 Acute upper respiratory infection, unspecified: Secondary | ICD-10-CM | POA: Insufficient documentation

## 2017-08-22 DIAGNOSIS — R509 Fever, unspecified: Secondary | ICD-10-CM | POA: Diagnosis present

## 2017-08-22 DIAGNOSIS — R059 Cough, unspecified: Secondary | ICD-10-CM

## 2017-08-22 DIAGNOSIS — R05 Cough: Secondary | ICD-10-CM | POA: Diagnosis not present

## 2017-08-22 LAB — CBC WITH DIFFERENTIAL/PLATELET
BASOS PCT: 0 %
Basophils Absolute: 0 10*3/uL (ref 0.0–0.1)
EOS PCT: 2 %
Eosinophils Absolute: 0.1 10*3/uL (ref 0.0–0.7)
HCT: 29.6 % — ABNORMAL LOW (ref 36.0–46.0)
HEMOGLOBIN: 9.1 g/dL — AB (ref 12.0–15.0)
Lymphocytes Relative: 17 %
Lymphs Abs: 1.2 10*3/uL (ref 0.7–4.0)
MCH: 21.4 pg — AB (ref 26.0–34.0)
MCHC: 30.7 g/dL (ref 30.0–36.0)
MCV: 69.6 fL — AB (ref 78.0–100.0)
MONO ABS: 0.4 10*3/uL (ref 0.1–1.0)
Monocytes Relative: 6 %
NEUTROS PCT: 75 %
Neutro Abs: 5.3 10*3/uL (ref 1.7–7.7)
PLATELETS: 310 10*3/uL (ref 150–400)
RBC: 4.25 MIL/uL (ref 3.87–5.11)
RDW: 16.1 % — ABNORMAL HIGH (ref 11.5–15.5)
WBC: 7 10*3/uL (ref 4.0–10.5)

## 2017-08-22 LAB — COMPREHENSIVE METABOLIC PANEL
ALBUMIN: 2.9 g/dL — AB (ref 3.5–5.0)
ALT: 7 U/L — AB (ref 14–54)
AST: 13 U/L — AB (ref 15–41)
Alkaline Phosphatase: 59 U/L (ref 38–126)
Anion gap: 8 (ref 5–15)
BUN: 8 mg/dL (ref 6–20)
CHLORIDE: 102 mmol/L (ref 101–111)
CO2: 28 mmol/L (ref 22–32)
CREATININE: 0.48 mg/dL (ref 0.44–1.00)
Calcium: 8.6 mg/dL — ABNORMAL LOW (ref 8.9–10.3)
GFR calc non Af Amer: >60 mL/min (ref >60)
Glucose, Bld: 105 mg/dL — ABNORMAL HIGH (ref 65–99)
Potassium: 3.7 mmol/L (ref 3.5–5.1)
SODIUM: 138 mmol/L (ref 135–145)
Total Bilirubin: 0.3 mg/dL (ref 0.3–1.2)
Total Protein: 7.6 g/dL (ref 6.5–8.1)

## 2017-08-22 LAB — PREGNANCY, URINE: Preg Test, Ur: NEGATIVE

## 2017-08-22 LAB — URINALYSIS, ROUTINE W REFLEX MICROSCOPIC
BACTERIA UA: NONE SEEN
Bilirubin Urine: NEGATIVE
Glucose, UA: NEGATIVE mg/dL
Ketones, ur: NEGATIVE mg/dL
Nitrite: NEGATIVE
Protein, ur: 30 mg/dL — AB
Specific Gravity, Urine: 1.009 (ref 1.005–1.030)
pH: 8 (ref 5.0–8.0)

## 2017-08-22 LAB — LIPASE, BLOOD: Lipase: 22 U/L (ref 11–51)

## 2017-08-22 MED ORDER — ACETAMINOPHEN 325 MG PO TABS
650.0000 mg | ORAL_TABLET | Freq: Once | ORAL | Status: AC
  Administered 2017-08-22: 650 mg via ORAL
  Filled 2017-08-22: qty 2

## 2017-08-22 MED ORDER — ONDANSETRON 4 MG PO TBDP: 4.0000 mg | ORAL_TABLET | Freq: Three times a day (TID) | ORAL | 0 refills | Status: DC | PRN

## 2017-08-22 MED ORDER — SODIUM CHLORIDE 0.9 % IV BOLUS (SEPSIS)
500.0000 mL | Freq: Once | INTRAVENOUS | Status: AC
  Administered 2017-08-22: 500 mL via INTRAVENOUS

## 2017-08-22 MED ORDER — BENZONATATE 100 MG PO CAPS: 100.0000 mg | ORAL_CAPSULE | Freq: Three times a day (TID) | ORAL | 0 refills | Status: DC

## 2017-08-22 MED ORDER — ACETAMINOPHEN 325 MG PO TABS: 325.0000 mg | ORAL_TABLET | Freq: Once | ORAL | Status: DC

## 2017-08-22 MED ORDER — HYDROMORPHONE HCL 1 MG/ML IJ SOLN
0.5000 mg | Freq: Once | INTRAMUSCULAR | Status: AC
  Administered 2017-08-22: 0.5 mg via INTRAVENOUS
  Filled 2017-08-22: qty 1

## 2017-08-22 MED ORDER — PROMETHAZINE HCL 25 MG/ML IJ SOLN
6.2500 mg | Freq: Once | INTRAMUSCULAR | Status: AC
  Administered 2017-08-22: 6.25 mg via INTRAVENOUS
  Filled 2017-08-22: qty 1

## 2017-08-22 MED ORDER — ONDANSETRON HCL 4 MG/2ML IJ SOLN
4.0000 mg | Freq: Once | INTRAMUSCULAR | Status: AC
  Administered 2017-08-22: 4 mg via INTRAVENOUS
  Filled 2017-08-22: qty 2

## 2017-08-22 MED ORDER — ONDANSETRON 4 MG PO TBDP: 4.0000 mg | ORAL_TABLET | Freq: Once | ORAL | Status: DC

## 2017-08-22 NOTE — ED Notes (Signed)
Patient transported to X-ray 
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
## 2017-08-22 NOTE — ED Notes (Signed)
CALLED [facility]  

## 2017-08-22 NOTE — ED Notes (Signed)
Pt moved into hallway while waiting on [facility]. Within eyesight of nurses station.

## 2017-08-22 NOTE — ED Notes (Signed)
ED Provider at bedside. 

## 2017-08-22 NOTE — Discharge Instructions (Signed)
Please read attached information regarding your condition. Take Tessalon Perles as needed for cough. Take Zofran as needed for nausea. Continue home medications as previously prescribed. Follow-up with PCP for further evaluation. Return to ED for abdominal pain, increased vomiting, urinary symptoms, fevers, chest pain, trouble breathing.

## 2017-08-22 NOTE — ED Provider Notes (Signed)
[facility]-EMERGENCY DEPT Provider Note   CSN: [id number] Arrival date & time: 08/22/17  1008     History   Chief Complaint Chief Complaint  Patient presents with  . Fever  . Chills    HPI [name] is a 31 y.o. female.  HPI  Patient, With a past medical history of spinal cord injury 13 years ago and quadriplegia with suprapubic catheter placement 4 months ago, presents to ED for evaluation of fever, chills, cough and nausea for the past day. She states that yesterday she began feeling nauseous but no vomiting. She also reports productive cough with "phlegm" since yesterday. She is concerned because some of her friends have pneumonia. She has noticed that her urine has been darker in color for t since the placement of the suprapubic catheter. She denies any vomiting, vaginal discharge, vaginal complaints, urinary symptoms, blood in stool, bowel changes.  Past Medical History:  Diagnosis Date  . Abnormal Pap smear   . Anemia   . ASCUS (atypical squamous cells of undetermined significance) on Pap smear 10/2011  . Asthma Mild, only with exposure to cigarette smoke  . Autonomic dysreflexia   . Chronic pain 2005  . Congenital absence of right kidney   . Decubitus ulcer of buttock, stage 2    bilateral-nondraining-redressed q other day  . Decubitus ulcer of coccygeal region, stage 1   . H/O scoliosis   . Horseshoe kidney   . Hx: UTI (urinary tract infection) 11/27/11  . Recurrent nephrolithiasis    s/p stent  . Recurrent UTI requires self catheterization  . Sciatica   . Spinal cord injury, cervical region (HCC) 05/19/2004   movement,sensation intact-unable to stand/ transfer since 2017 after septic shock episode per pt  . Urinary tract infection 03/2017    Patient Active Problem List   Diagnosis Date Noted  . Oligomenorrhea 05/19/2012    Past Surgical History:  Procedure Laterality Date  . BACK SURGERY  Scoliosis, Harrington Rods in place  . Bilateral leg surgery    .  C3-6 spine surgery with rods    . dvt filter placement  2005  . INSERTION OF SUPRAPUBIC CATHETER N/A 05/15/2017   Procedure: INSERTION OF SUPRAPUBIC CATHETER;  Surgeon: Ihor Gully, MD;  Location: [facility];  Service: Urology;  Laterality: N/A;  . LITHOTRIPSY    . ORTHOPEDIC SURGERY  Multiple lower extremity surgeries as a child  . VASCULAR SURGERY  IVC filter placed in 2005    OB History    Gravida Para Term Preterm AB Living   0             SAB TAB Ectopic Multiple Live Births                   Home Medications    Prior to Admission medications   Medication Sig Start Date End Date Taking? Authorizing Provider  acetaminophen (TYLENOL) 325 MG tablet Take 2 tablets (650 mg total) by mouth every 6 (six) hours as needed for mild pain, moderate pain or fever. 04/02/17   Everlene Farrier, PA-C  albuterol (PROVENTIL HFA;VENTOLIN HFA) 108 (90 BASE) MCG/ACT inhaler Inhale 2 puffs into the lungs every 4 (four) hours as needed for wheezing or shortness of breath.     [provider]  benzonatate (TESSALON) 100 MG capsule Take 1 capsule (100 mg total) by mouth every 8 (eight) hours. 08/22/17   Jamira Barfuss, PA-C  diazepam (VALIUM) 5 MG tablet Take 10 mg by mouth every  12 (twelve) hours. 12/03/16   [provider]  HYDROmorphone (DILAUDID) 4 MG tablet Take 4 mg by mouth every 6 (six) hours.     [provider]  levofloxacin (LEVAQUIN) 750 MG tablet Take 1 tablet (750 mg total) by mouth daily. Patient not taking: Reported on 05/17/2017 04/02/17   Everlene Farrieransie, William, PA-C  LYRICA 50 MG capsule Take 50 mg by mouth 3 (three) times daily. 03/24/17   [provider]  nitrofurantoin (MACRODANTIN) 100 MG capsule Take 100 mg by mouth at bedtime.     [provider]  ondansetron (ZOFRAN ODT) 4 MG disintegrating tablet Take 1 tablet (4 mg total) by mouth every 8 (eight) hours as needed for nausea or vomiting. 08/22/17   Emalia Witkop, PA-C  Oxycodone HCl 10  MG TABS Take 1 tablet (10 mg total) by mouth every 4 (four) hours as needed. Patient taking differently: Take 10 mg by mouth every 4 (four) hours as needed (pain).  05/15/17   Ihor Gullyttelin, Mark, MD  promethazine (PHENERGAN) 25 MG tablet Take 25 mg by mouth every 8 (eight) hours as needed for nausea.    [provider]  solifenacin (VESICARE) 10 MG tablet Take 10 mg by mouth every morning.     [provider]  zolpidem (AMBIEN) 10 MG tablet Take 10 mg by mouth at bedtime.     [provider]    Family History Family History  Problem Relation Age of Onset  . Heart disease Mother        Massive MI in 2006 ; died    Social History Social History  Substance Use Topics  . Smoking status: Never Smoker  . Smokeless tobacco: Never Used  . Alcohol use No     Allergies   Metoclopramide hcl; Benadryl [diphenhydramine hcl]; Gabapentin; and Morphine   Review of Systems Review of Systems  Constitutional: Positive for appetite change, chills and fatigue. Negative for fever.  HENT: Negative for ear pain, rhinorrhea, sneezing and sore throat.   Eyes: Negative for photophobia and visual disturbance.  Respiratory: Positive for cough. Negative for chest tightness, shortness of breath and wheezing.   Cardiovascular: Negative for chest pain and palpitations.  Gastrointestinal: Positive for abdominal pain and nausea. Negative for blood in stool, constipation, diarrhea and vomiting.  Genitourinary: Negative for dysuria, hematuria and urgency.  Musculoskeletal: Negative for myalgias.  Skin: Negative for rash.  Neurological: Negative for dizziness, weakness and light-headedness.     Physical Exam Updated Vital Signs BP 97/63   Pulse 74   Temp 98.2 F (36.8 C) (Oral)   Resp 15   Ht 4\' 11"  (1.499 m)   Wt 40.8 kg (90 lb)   SpO2 98%   BMI 18.18 kg/m   Physical Exam  Constitutional: She appears well-developed and well-nourished. No distress.  Nontoxic appearing and in  no acute distress. Appears uncomfortable but speaking in full sentences.  HENT:  Head: Normocephalic and atraumatic.  Nose: Nose normal.  Eyes: Conjunctivae and EOM are normal. Right eye exhibits no discharge. Left eye exhibits no discharge. No scleral icterus.  Neck: Normal range of motion. Neck supple.  Cardiovascular: Normal rate, regular rhythm, normal heart sounds and intact distal pulses.  Exam reveals no gallop and no friction rub.   No murmur heard. Pulmonary/Chest: Effort normal and breath sounds normal. No respiratory distress.  Abdominal: Soft. Bowel sounds are normal. She exhibits no distension. There is no tenderness. There is no rebound and no guarding.  Suprapubic catheter site with  no signs of infection. Yellow urine noted in bag. No abdominal tenderness to palpation or discomfort.  Musculoskeletal: Normal range of motion. She exhibits no edema.  Neurological: She is alert.  Skin: Skin is warm and dry. No rash noted.  Psychiatric: She has a normal mood and affect.  Nursing note and vitals reviewed.    ED Treatments / Results  Labs (all labs ordered are listed, but only abnormal results are displayed) Labs Reviewed  URINALYSIS, ROUTINE W REFLEX MICROSCOPIC - Abnormal; Notable for the following:       Result Value   APPearance CLOUDY (*)    Hgb urine dipstick SMALL (*)    Protein, ur 30 (*)    Leukocytes, UA LARGE (*)    Squamous Epithelial / LPF 0-5 (*)    All other components within normal limits  COMPREHENSIVE METABOLIC PANEL - Abnormal; Notable for the following:    Glucose, Bld 105 (*)    Calcium 8.6 (*)    Albumin 2.9 (*)    AST 13 (*)    ALT 7 (*)    All other components within normal limits  CBC WITH DIFFERENTIAL/PLATELET - Abnormal; Notable for the following:    Hemoglobin 9.1 (*)    HCT 29.6 (*)    MCV 69.6 (*)    MCH 21.4 (*)    RDW 16.1 (*)    All other components within normal limits  PREGNANCY, URINE  LIPASE, BLOOD    EKG  EKG  Interpretation None       Radiology Dg Chest 2 View  Result Date: 08/22/2017 CLINICAL DATA:  Productive cough for the past 2 days. EXAM: CHEST  2 VIEW COMPARISON:  04/02/2017. FINDINGS: Normal sized heart. Clear lungs with normal vascularity. Inferior vena cava filter tip at the inferior L2 level. Moderate scoliosis with Harrington rods and cervical spine fixation hardware, without significant change. IMPRESSION: No acute abnormality. Electronically Signed   By: Beckie Salts M.D.   On: 08/22/2017 11:52    Procedures Procedures (including critical care time)  Medications Ordered in ED Medications  HYDROmorphone (DILAUDID) injection 0.5 mg (not administered)  promethazine (PHENERGAN) injection 6.25 mg (not administered)  sodium chloride 0.9 % bolus 500 mL (0 mLs Intravenous Stopped 08/22/17 1238)  ondansetron (ZOFRAN) injection 4 mg (4 mg Intravenous Given 08/22/17 1125)  acetaminophen (TYLENOL) tablet 650 mg (650 mg Oral Given 08/22/17 1126)  HYDROmorphone (DILAUDID) injection 0.5 mg (0.5 mg Intravenous Given 08/22/17 1238)     Initial Impression / Assessment and Plan / ED Course  I have reviewed the triage vital signs and the nursing notes.  Pertinent labs & imaging results that were available during my care of the patient were reviewed by me and considered in my medical decision making (see chart for details).     Patient presents to ED for evaluation of fever, cough, nausea for the past day. She denies any vomiting, hemoptysis, chest pain, abdominal pain, diarrhea, constipation or blood in stool. Here she is afebrile with no use of antipyretics since yesterday. She states that she has Dilaudid at home for pain but has been taking it without food in her stomach. She has no urinary changes and no signs of infection around site of suprapubic catheter. There is yellow urine noted in bag. Blood pressure in 90s over 60s here in the ED which patient states that it is normal for her blood pressure  to be 70 systolic. Lipase unremarkable. CBC with hemoglobin and hematocrit similar to baseline. CMP unremarkable.  Urinalysis with leukocytes but no bacteria or nitrites present. Patient given fluids, pain control and antiemetics here in the ED. She states that she continues to have pain. Will give Dilaudid here in the ED since patient states that she takes at home. I have low suspicion for acute surgical abnormality being the cause of her nausea considering her history, symptoms and lack of findings on abdominal exam and lab work. She continues to be afebrile here in the ED. Normal heart rate and blood pressure improved to 110 over 60s with fluids. We'll give Tessalon Perles and Zofran to help with symptoms. Encouraged her to follow-up with PCP for further evaluation. Patient appears stable for discharge at this time. Strict return precautions given.  Final Clinical Impressions(s) / ED Diagnoses   Final diagnoses:  Nausea  Viral upper respiratory tract infection  Cough    New Prescriptions New Prescriptions   BENZONATATE (TESSALON) 100 MG CAPSULE    Take 1 capsule (100 mg total) by mouth every 8 (eight) hours.   ONDANSETRON (ZOFRAN ODT) 4 MG DISINTEGRATING TABLET    Take 1 tablet (4 mg total) by mouth every 8 (eight) hours as needed for nausea or vomiting.     Dietrich Pates, PA-C 08/22/17 1522    Lorre Nick, MD 08/23/17 [phone]

## 2017-09-16 ENCOUNTER — Emergency Department (HOSPITAL_COMMUNITY)
Admission: EM | Admit: 2017-09-16 | Discharge: 2017-09-16 | Disposition: A | Discharge status: Home / Self Care | Payer: Medicare Other | Attending: Emergency Medicine | Admitting: Emergency Medicine

## 2017-09-16 ENCOUNTER — Emergency Department (HOSPITAL_COMMUNITY): Payer: Medicare Other

## 2017-09-16 ENCOUNTER — Encounter (HOSPITAL_COMMUNITY): Payer: Self-pay | Admitting: Emergency Medicine

## 2017-09-16 DIAGNOSIS — J45909 Unspecified asthma, uncomplicated: Secondary | ICD-10-CM | POA: Insufficient documentation

## 2017-09-16 DIAGNOSIS — N390 Urinary tract infection, site not specified: Secondary | ICD-10-CM | POA: Diagnosis not present

## 2017-09-16 DIAGNOSIS — M25552 Pain in left hip: Secondary | ICD-10-CM | POA: Diagnosis present

## 2017-09-16 DIAGNOSIS — Z79899 Other long term (current) drug therapy: Secondary | ICD-10-CM | POA: Diagnosis not present

## 2017-09-16 LAB — CBC WITH DIFFERENTIAL/PLATELET
BASOS ABS: 0 10*3/uL (ref 0.0–0.1)
Basophils Relative: 0 %
EOS PCT: 6 %
Eosinophils Absolute: 0.4 10*3/uL (ref 0.0–0.7)
HCT: 29.4 % — ABNORMAL LOW (ref 36.0–46.0)
Hemoglobin: 8.6 g/dL — ABNORMAL LOW (ref 12.0–15.0)
LYMPHS ABS: 1.8 10*3/uL (ref 0.7–4.0)
Lymphocytes Relative: 28 %
MCH: 20.6 pg — ABNORMAL LOW (ref 26.0–34.0)
MCHC: 29.3 g/dL — AB (ref 30.0–36.0)
MCV: 70.5 fL — AB (ref 78.0–100.0)
MONO ABS: 0.5 10*3/uL (ref 0.1–1.0)
Monocytes Relative: 8 %
NEUTROS ABS: 3.7 10*3/uL (ref 1.7–7.7)
Neutrophils Relative %: 58 %
PLATELETS: 283 10*3/uL (ref 150–400)
RBC: 4.17 MIL/uL (ref 3.87–5.11)
RDW: 17.2 % — AB (ref 11.5–15.5)
WBC: 6.4 10*3/uL (ref 4.0–10.5)

## 2017-09-16 LAB — URINALYSIS, ROUTINE W REFLEX MICROSCOPIC
Bilirubin Urine: NEGATIVE
GLUCOSE, UA: NEGATIVE mg/dL
KETONES UR: NEGATIVE mg/dL
Nitrite: POSITIVE — AB
PH: 6 (ref 5.0–8.0)
Protein, ur: 100 mg/dL — AB
SPECIFIC GRAVITY, URINE: 1.017 (ref 1.005–1.030)
SQUAMOUS EPITHELIAL / LPF: NONE SEEN

## 2017-09-16 LAB — I-STAT CHEM 8, ED
BUN: 15 mg/dL (ref 6–20)
Calcium, Ion: 1.08 mmol/L — ABNORMAL LOW (ref 1.15–1.40)
Chloride: 99 mmol/L — ABNORMAL LOW (ref 101–111)
Creatinine, Ser: 0.5 mg/dL (ref 0.44–1.00)
GLUCOSE: 95 mg/dL (ref 65–99)
HCT: 32 % — ABNORMAL LOW (ref 36.0–46.0)
HEMOGLOBIN: 10.9 g/dL — AB (ref 12.0–15.0)
Potassium: 3.8 mmol/L (ref 3.5–5.1)
Sodium: 138 mmol/L (ref 135–145)
TCO2: 33 mmol/L — AB (ref 22–32)

## 2017-09-16 LAB — POC URINE PREG, ED: Preg Test, Ur: NEGATIVE

## 2017-09-16 MED ORDER — HYDROMORPHONE HCL 1 MG/ML IJ SOLN
1.0000 mg | Freq: Once | INTRAMUSCULAR | Status: AC
  Administered 2017-09-16: 1 mg via INTRAVENOUS
  Filled 2017-09-16: qty 1

## 2017-09-16 MED ORDER — CEFTRIAXONE SODIUM 1 G IJ SOLR
1.0000 g | Freq: Once | INTRAMUSCULAR | Status: AC
  Administered 2017-09-16: 1 g via INTRAVENOUS
  Filled 2017-09-16: qty 10

## 2017-09-16 MED ORDER — ONDANSETRON HCL 4 MG/2ML IJ SOLN
4.0000 mg | Freq: Once | INTRAMUSCULAR | Status: AC
  Administered 2017-09-16: 4 mg via INTRAVENOUS
  Filled 2017-09-16: qty 2

## 2017-09-16 MED ORDER — SODIUM CHLORIDE 0.9 % IV BOLUS (SEPSIS)
500.0000 mL | Freq: Once | INTRAVENOUS | Status: AC
  Administered 2017-09-16: 500 mL via INTRAVENOUS

## 2017-09-16 MED ORDER — CEPHALEXIN 500 MG PO CAPS: 500.0000 mg | ORAL_CAPSULE | Freq: Three times a day (TID) | ORAL | 0 refills | Status: DC

## 2017-09-16 MED ORDER — IOPAMIDOL (ISOVUE-300) INJECTION 61%
INTRAVENOUS | Status: AC
  Filled 2017-09-16: qty 100

## 2017-09-16 MED ORDER — PROMETHAZINE HCL 25 MG PO TABS: 25.0000 mg | ORAL_TABLET | Freq: Three times a day (TID) | ORAL | 0 refills | Status: DC | PRN

## 2017-09-16 MED ORDER — IOPAMIDOL (ISOVUE-300) INJECTION 61%
INTRAVENOUS | Status: AC
  Administered 2017-09-16: 75 mL
  Filled 2017-09-16: qty 30

## 2017-09-16 NOTE — Progress Notes (Signed)
CSW consulted due to pt wanting to speak with a social worker before leaving the hospital. CSW spoke with pt and pt expressed being displeased with the care that pt received here while in the hospital from the doctor and nursing staff. CSW apologized to pt about any mishaps and offered support for pt as pt is returning home today. There are no further CSW needs. CSW signing off.    Claude Manges Marlayna Bannister, MSW, LCSW-A Emergency Department Clinical Social Worker [phone]

## 2017-09-16 NOTE — Discharge Instructions (Signed)
You have been evaluated for your condition.  Your CT scan did not show any concerning feature.  You are constipated.  You also have evidence of a urinary tract infection, which may cause your nausea.  Please take antibiotic and anti-nausea medication as prescribed.  Take your home pain medications and follow up with your doctor for further care.  Return if you have any concerns.

## 2017-09-16 NOTE — ED Triage Notes (Signed)
Pt in from home via [facility] with c/o L hip pain. 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 Hx of ulcers to L hip. Pt alert, BP 118/85 but usually 70's SBP per pt

## 2017-09-16 NOTE — ED Notes (Signed)
[facility] contacted to transport patient to home 

## 2017-09-16 NOTE — ED Notes (Signed)
Patient is resting with call bell in reach  

## 2017-09-16 NOTE — ED Provider Notes (Signed)
[facility]-EMERGENCY DEPT Provider Note   CSN: [id number] Arrival date & time: 09/16/17  4098     History   Chief Complaint Chief Complaint  Patient presents with  . Hip Pain    HPI [name] is a 31 y.o. female.  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 Patient states she was told that she has osteomyelitis of her left hip in 2015. She has had progressive worsening pain to her left hip since then which has intensified within the past year. She described the pain as "someone using an ax and hitting me repeatedly". Pain is located to the lateral aspects of her left hip, nonradiating. There is no associated fever, chills, abdominal pain. She does endorse nausea without vomiting or diarrhea. She has a supra pubic catheter which has been putting out normal amount of urine. She also has several chronic sacral decubitus ulcer that her fianc is currently cared for and she denies any increasing pain at this location. Her primary complaint is left hip pain. She is unable to give me a definitive timeframe of any recent changes in her pain. She does admits to being cared for by a pain specialist and takes dilaudid at home. She states her nausea for the past several days has prevented her from taking her pain medication.  Past Medical History:  Diagnosis Date  . Abnormal Pap smear   . Anemia   . ASCUS (atypical squamous cells of undetermined significance) on Pap smear 10/2011  . Asthma Mild, only with exposure to cigarette smoke  . Autonomic dysreflexia   . Chronic pain 2005  . Congenital absence of right kidney   . Decubitus ulcer of buttock, stage 2    bilateral-nondraining-redressed q other day  . Decubitus ulcer of coccygeal region, stage 1   . H/O scoliosis   . Horseshoe kidney   . Hx: UTI (urinary  tract infection) 11/27/11  . Recurrent nephrolithiasis    s/p stent  . Recurrent UTI requires self catheterization  . Sciatica   . Spinal cord injury, cervical region (HCC) 05/19/2004   movement,sensation intact-unable to stand/ transfer since 2017 after septic shock episode per pt  . Urinary tract infection 03/2017    Patient Active Problem List   Diagnosis Date Noted  . Oligomenorrhea 05/19/2012    Past Surgical History:  Procedure Laterality Date  . BACK SURGERY  Scoliosis, Harrington Rods in place  . Bilateral leg surgery    . C3-6 spine surgery with rods    . dvt filter placement  2005  . INSERTION OF SUPRAPUBIC CATHETER N/A 05/15/2017   Procedure: INSERTION OF SUPRAPUBIC CATHETER;  Surgeon: Ihor Gully, MD;  Location: [facility];  Service: Urology;  Laterality: N/A;  . LITHOTRIPSY    . ORTHOPEDIC SURGERY  Multiple lower extremity surgeries as a child  . VASCULAR SURGERY  IVC filter placed in 2005    OB History    Gravida Para Term Preterm AB Living   0             SAB TAB Ectopic Multiple Live Births                   Home Medications    Prior to Admission medications   Medication Sig Start Date End Date  Taking? Authorizing Provider  acetaminophen (TYLENOL) 325 MG tablet Take 2 tablets (650 mg total) by mouth every 6 (six) hours as needed for mild pain, moderate pain or fever. 04/02/17   Everlene Farrier, PA-C  albuterol (PROVENTIL HFA;VENTOLIN HFA) 108 (90 BASE) MCG/ACT inhaler Inhale 2 puffs into the lungs every 4 (four) hours as needed for wheezing or shortness of breath.     [provider]  benzonatate (TESSALON) 100 MG capsule Take 1 capsule (100 mg total) by mouth every 8 (eight) hours. 08/22/17   Khatri, Hina, PA-C  diazepam (VALIUM) 5 MG tablet Take 10 mg by mouth every 12 (twelve) hours. 12/03/16   [provider]  HYDROmorphone (DILAUDID) 4 MG tablet Take 4 mg by mouth every 6 (six) hours.     [provider]    levofloxacin (LEVAQUIN) 750 MG tablet Take 1 tablet (750 mg total) by mouth daily. Patient not taking: Reported on 05/17/2017 04/02/17   Everlene Farrier, PA-C  LYRICA 50 MG capsule Take 50 mg by mouth 3 (three) times daily. 03/24/17   [provider]  nitrofurantoin (MACRODANTIN) 100 MG capsule Take 100 mg by mouth at bedtime.     [provider]  ondansetron (ZOFRAN ODT) 4 MG disintegrating tablet Take 1 tablet (4 mg total) by mouth every 8 (eight) hours as needed for nausea or vomiting. 08/22/17   Khatri, Hina, PA-C  Oxycodone HCl 10 MG TABS Take 1 tablet (10 mg total) by mouth every 4 (four) hours as needed. Patient taking differently: Take 10 mg by mouth every 4 (four) hours as needed (pain).  05/15/17   Ihor Gully, MD  promethazine (PHENERGAN) 25 MG tablet Take 25 mg by mouth every 8 (eight) hours as needed for nausea.    [provider]  solifenacin (VESICARE) 10 MG tablet Take 10 mg by mouth every morning.     [provider]  zolpidem (AMBIEN) 10 MG tablet Take 10 mg by mouth at bedtime.     [provider]    Family History Family History  Problem Relation Age of Onset  . Heart disease Mother        Massive MI in 2005/05/09 ; died    Social History Social History  Substance Use Topics  . Smoking status: Never Smoker  . Smokeless tobacco: Never Used  . Alcohol use No     Allergies   Metoclopramide hcl; Benadryl [diphenhydramine hcl]; Gabapentin; and Morphine   Review of Systems Review of Systems  All other systems reviewed and are negative.    Physical Exam Updated Vital Signs BP (!) 124/91 (BP Location: Right Arm)   Pulse 82   Temp 97.9 F (36.6 C) (Oral)   Resp 16   Ht  (1.499 m)   Wt 40.8 kg (90 lb)   SpO2 98%   BMI 18.18 kg/m   Physical Exam  Constitutional:  Frail-appearing female nontoxic in appearance  HENT:  Dry flaky skin noted throughout face  Cardiovascular: Normal rate and regular rhythm.    Pulmonary/Chest: Effort normal and breath sounds normal.  Abdominal: Soft. Bowel sounds are normal. She exhibits no distension. There is no tenderness.  Suprapubic Foley catheter in place with normal appearance.  Musculoskeletal:  Chaperone present during exam. Tenderness to left lateral hip on palpation without any overlying skin changes bruising or deformity noted. Sacral decubitus ulcer, unstageable, well appearing.  BLE appears chronically contracted and atrophied.   Nursing note and vitals reviewed.    ED Treatments /  Results  Labs (all labs ordered are listed, but only abnormal results are displayed) Labs Reviewed  CBC WITH DIFFERENTIAL/PLATELET - Abnormal; Notable for the following:       Result Value   Hemoglobin 8.6 (*)    HCT 29.4 (*)    MCV 70.5 (*)    MCH 20.6 (*)    MCHC 29.3 (*)    RDW 17.2 (*)    All other components within normal limits  URINALYSIS, ROUTINE W REFLEX MICROSCOPIC - Abnormal; Notable for the following:    Color, Urine AMBER (*)    APPearance CLOUDY (*)    Hgb urine dipstick SMALL (*)    Protein, ur 100 (*)    Nitrite POSITIVE (*)    Leukocytes, UA LARGE (*)    Bacteria, UA MANY (*)    All other components within normal limits  I-STAT CHEM 8, ED - Abnormal; Notable for the following:    Chloride 99 (*)    Calcium, Ion 1.08 (*)    TCO2 33 (*)    Hemoglobin 10.9 (*)    HCT 32.0 (*)    All other components within normal limits  URINE CULTURE  POC URINE PREG, ED    EKG  EKG Interpretation None       Radiology Ct Abdomen Pelvis W Contrast  Result Date: 09/16/2017 CLINICAL DATA:  Ongoing diffuse abdomen pain for months. EXAM: CT ABDOMEN AND PELVIS WITH CONTRAST TECHNIQUE: Multidetector CT imaging of the abdomen and pelvis was performed using the standard protocol following bolus administration of intravenous contrast. CONTRAST:  75mL ISOVUE-300 IOPAMIDOL (ISOVUE-300) INJECTION 61% COMPARISON:  May 17, 2017 FINDINGS: Lower chest: The heart  size is upper limits of normal. The lung bases are clear. Hepatobiliary: No focal liver abnormality is seen. No gallstones, gallbladder wall thickening, or biliary dilatation. Pancreas: Unremarkable. No pancreatic ductal dilatation or surrounding inflammatory changes. Spleen: Normal in size without focal abnormality. 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 There is no hydronephrosis. The bladder is decompressed with suprapubic catheter in place. Stomach/Bowel: There is no bowel obstruction. Extensive bowel content is identified throughout colon. The appendix is not seen but no inflammation is noted around the cecum. The stomach is normal. Vascular/Lymphatic: The IVC filter is identified. The aorta is normal. There is no abdominal lymphadenopathy. Reproductive: Uterus and bilateral adnexa are unremarkable. Other: Ulcerations and defects of the posterior bilateral lower buttock and upper thigh are identified with associated chronic changes underlying bones stable compared prior exam. Musculoskeletal: Ulcerations and defects of the posterior bilateral lower buttock and upper thigh are identified with associated chronic changes underlying bones stable compared prior exam. Harrington rods are identified. IMPRESSION: No acute abnormality identified in the abdomen and pelvis. No bowel obstruction. Marked constipation. 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 Electronically Signed   By: Sherian Rein M.D.   On: 09/16/2017 12:13    Procedures Procedures (including critical care time)  Medications Ordered in ED Medications  iopamidol (ISOVUE-300) 61 % injection (not administered)  sodium chloride 0.9 % bolus 500 mL (0 mLs Intravenous Stopped 09/16/17 1034)  ondansetron (ZOFRAN) injection 4 mg (4 mg Intravenous Given 09/16/17 0739)  HYDROmorphone (DILAUDID) injection 1 mg (1 mg Intravenous Given 09/16/17 0738)  cefTRIAXone (ROCEPHIN) 1  g in dextrose 5 % 50 mL IVPB (0 g Intravenous Stopped 09/16/17 1020)  iopamidol (ISOVUE-300) 61 % injection (75 mLs  Contrast Given 09/16/17 1143)  HYDROmorphone (DILAUDID) injection 1 mg (1 mg  Intravenous Given 09/16/17 1136)  ondansetron (ZOFRAN) injection 4 mg (4 mg Intravenous Given 09/16/17 1137)     Initial Impression / Assessment and Plan / ED Course  I have reviewed the triage vital signs and the nursing notes.  Pertinent labs & imaging results that were available during my care of the patient were reviewed by me and considered in my medical decision making (see chart for details).     BP 126/80 (BP Location: Right Arm)   Pulse 87   Temp 98.4 F (36.9 C) (Oral)   Resp 16   Ht  (1.499 m)   Wt 40.8 kg (90 lb)   SpO2 100%   BMI 18.18 kg/m    Final Clinical Impressions(s) / ED Diagnoses   Final diagnoses:  Left hip pain  Urinary tract infection without hematuria, site unspecified    New Prescriptions New Prescriptions   CEPHALEXIN (KEFLEX) 500 MG CAPSULE    Take 1 capsule (500 mg total) by mouth 3 (three) times daily.   6:58 AM Patient complaining of progressive worsening left hip pain. This appears to be a chronic problem. She is afebrile and denies having any fever or chills. Her history is confusing as patient mentioned she has history of osteomyelitis that was diagnosed on MRI however she also mentioned that she had a biopsy of her hip and was told that she does not have osteomyelitis. She received her care in [place]. She has a pain management for her condition. I felt that her pain is increasing due to having nausea and unable to take a medicine. Plan to obtain UA, as well as having abdominal pelvis CT scan for further evaluation. I do not think an emergent L hip MRI is indicated at this time. Care discussed with DR. Floyd.   9:18 AM Urine is cloudy, positive nitrite, large leukocyte esterase, WBC too numerous to count. Patient does have an indwelling  suprapubic Foley catheter that was changed yesterday according to patient. Since she is having signs of urinary tract infection, urine culture sent, will obtain an abdominal pelvic CT scan however I will also treat patient for suspected UTI with Rocephin.  12:29 PM Abdominal and pelvis CT scan showed no acute abdomen at the. No evidence of small bowel obstruction. There is marked constipation. Patient admits that she does have history of constipation, and her fianc does perform manual disimpaction on a daily basis. Pt does not want to be disimpacted currently.  Pt does request additional pain medication.  She has received 2 doses of  Dilaudid while in the ER.  Given her body habitus and her vital sign, I do not feel comfortable given additional strong pain medication.  I did offer other options such as tylenol/ibuprofen/percocet/morphine which pt declined.  Pt sts for discharge.  Return precaution given.  Pt will go home with keflex and phenergan.     Fayrene Helper, PA-C 09/16/17 1247    Melene Plan, DO 09/18/17 1859

## 2017-09-18 ENCOUNTER — Emergency Department (HOSPITAL_COMMUNITY): Payer: Medicare Other

## 2017-09-18 ENCOUNTER — Encounter (HOSPITAL_COMMUNITY): Payer: Self-pay

## 2017-09-18 ENCOUNTER — Emergency Department (HOSPITAL_COMMUNITY)
Admission: EM | Admit: 2017-09-18 | Discharge: 2017-09-18 | Disposition: A | Discharge status: Home / Self Care | Payer: Medicare Other | Attending: Emergency Medicine | Admitting: Emergency Medicine

## 2017-09-18 DIAGNOSIS — J45909 Unspecified asthma, uncomplicated: Secondary | ICD-10-CM | POA: Diagnosis not present

## 2017-09-18 DIAGNOSIS — E860 Dehydration: Secondary | ICD-10-CM | POA: Diagnosis not present

## 2017-09-18 DIAGNOSIS — R109 Unspecified abdominal pain: Secondary | ICD-10-CM | POA: Diagnosis present

## 2017-09-18 DIAGNOSIS — Z79899 Other long term (current) drug therapy: Secondary | ICD-10-CM | POA: Insufficient documentation

## 2017-09-18 DIAGNOSIS — K5901 Slow transit constipation: Secondary | ICD-10-CM | POA: Insufficient documentation

## 2017-09-18 LAB — CBC WITH DIFFERENTIAL/PLATELET
Basophils Absolute: 0.1 10*3/uL (ref 0.0–0.1)
Basophils Relative: 1 %
EOS ABS: 0.3 10*3/uL (ref 0.0–0.7)
EOS PCT: 4 %
HCT: 32.1 % — ABNORMAL LOW (ref 36.0–46.0)
HEMOGLOBIN: 9.8 g/dL — AB (ref 12.0–15.0)
Lymphocytes Relative: 28 %
Lymphs Abs: 2.2 10*3/uL (ref 0.7–4.0)
MCH: 21.1 pg — ABNORMAL LOW (ref 26.0–34.0)
MCHC: 30.5 g/dL (ref 30.0–36.0)
MCV: 69.2 fL — AB (ref 78.0–100.0)
MONO ABS: 0.9 10*3/uL (ref 0.1–1.0)
Monocytes Relative: 11 %
NEUTROS PCT: 56 %
Neutro Abs: 4.3 10*3/uL (ref 1.7–7.7)
PLATELETS: 352 10*3/uL (ref 150–400)
RBC: 4.64 MIL/uL (ref 3.87–5.11)
RDW: 17.8 % — ABNORMAL HIGH (ref 11.5–15.5)
WBC: 7.8 10*3/uL (ref 4.0–10.5)

## 2017-09-18 LAB — COMPREHENSIVE METABOLIC PANEL
ALK PHOS: 57 U/L (ref 38–126)
ALT: 8 U/L — AB (ref 14–54)
AST: 16 U/L (ref 15–41)
Albumin: 3.4 g/dL — ABNORMAL LOW (ref 3.5–5.0)
Anion gap: 10 (ref 5–15)
BUN: 5 mg/dL — AB (ref 6–20)
CALCIUM: 9 mg/dL (ref 8.9–10.3)
CO2: 27 mmol/L (ref 22–32)
CREATININE: 0.47 mg/dL (ref 0.44–1.00)
Chloride: 100 mmol/L — ABNORMAL LOW (ref 101–111)
GFR calc Af Amer: >60 mL/min (ref >60)
Glucose, Bld: 88 mg/dL (ref 65–99)
Potassium: 3.5 mmol/L (ref 3.5–5.1)
Sodium: 137 mmol/L (ref 135–145)
Total Bilirubin: 0.6 mg/dL (ref 0.3–1.2)
Total Protein: 8.5 g/dL — ABNORMAL HIGH (ref 6.5–8.1)

## 2017-09-18 LAB — URINE CULTURE

## 2017-09-18 MED ORDER — SODIUM CHLORIDE 0.9 % IV BOLUS (SEPSIS)
500.0000 mL | Freq: Once | INTRAVENOUS | Status: AC
  Administered 2017-09-18: 500 mL via INTRAVENOUS

## 2017-09-18 MED ORDER — HYDROMORPHONE HCL 1 MG/ML IJ SOLN
1.0000 mg | Freq: Once | INTRAMUSCULAR | Status: AC
  Administered 2017-09-18: 1 mg via INTRAVENOUS
  Filled 2017-09-18: qty 1

## 2017-09-18 NOTE — ED Triage Notes (Signed)
Pt from home brought in by EMS; pt was seen yesterday at [facility] for same c/o chronic left hip pain; pt states she has had pain x 1 year and feels she needs to ensure everything is ok. Pt states she is from home with fiance and his mom; pt's fiance has been in the hospital for a couple of days and his mother is unable to take care of pt; pt states fiance normally disimpact pt on a daily and she is unable to have BM without rectal stimulation; pt c/o abdominal pain from impaction feels distended; Pt's last BM is 09/15/17. Pt a&ox 4 on arrival. Pt has chronic health issues.

## 2017-09-18 NOTE — ED Provider Notes (Addendum)
[facility]-EMERGENCY DEPT Provider Note   CSN: [id number] Arrival date & time: 09/18/17  0036     History   Chief Complaint Chief Complaint  Patient presents with  . Hip Pain  . Abdominal Pain    HPI [name] is a 31 y.o. female.  Patient presents to the emergency department for evaluation of abdominal pain secondary to constipation and left hip pain. Patient is a partial quadriplegic. She has a history of chronic constipation, needs rectal stimulation and disimpaction daily. Her fianc normally does this, but he has been in the hospital for the last couple of days and she has not had a bowel movement. No vomiting or fever.  Patient has a history of left hip pain which is chronic in nature. She has multiple decubitus ulcers that are cared for at home. She has not had fever or any significant drainage changes from her wounds.      Past Medical History:  Diagnosis Date  . Abnormal Pap smear   . Anemia   . ASCUS (atypical squamous cells of undetermined significance) on Pap smear 10/2011  . Asthma Mild, only with exposure to cigarette smoke  . Autonomic dysreflexia   . Chronic pain 2005  . Congenital absence of right kidney   . Decubitus ulcer of buttock, stage 2    bilateral-nondraining-redressed q other day  . Decubitus ulcer of coccygeal region, stage 1   . H/O scoliosis   . Horseshoe kidney   . Hx: UTI (urinary tract infection) 11/27/11  . Recurrent nephrolithiasis    s/p stent  . Recurrent UTI requires self catheterization  . Sciatica   . Spinal cord injury, cervical region (HCC) 05/19/2004   movement,sensation intact-unable to stand/ transfer since 2017 after septic shock episode per pt  . Urinary tract infection 03/2017    Patient Active Problem List   Diagnosis Date Noted  . Oligomenorrhea 05/19/2012    Past Surgical History:  Procedure Laterality Date  . BACK SURGERY  Scoliosis, Harrington Rods in place  . Bilateral leg surgery    . C3-6 spine  surgery with rods    . dvt filter placement  2005  . INSERTION OF SUPRAPUBIC CATHETER N/A 05/15/2017   Procedure: INSERTION OF SUPRAPUBIC CATHETER;  Surgeon: Ihor Gully, MD;  Location: [facility];  Service: Urology;  Laterality: N/A;  . LITHOTRIPSY    . ORTHOPEDIC SURGERY  Multiple lower extremity surgeries as a child  . VASCULAR SURGERY  IVC filter placed in 2005    OB History    Gravida Para Term Preterm AB Living   0             SAB TAB Ectopic Multiple Live Births                   Home Medications    Prior to Admission medications   Medication Sig Start Date End Date Taking? Authorizing Provider  acetaminophen (TYLENOL) 325 MG tablet Take 2 tablets (650 mg total) by mouth every 6 (six) hours as needed for mild pain, moderate pain or fever. 04/02/17   Everlene Farrier, PA-C  albuterol (PROVENTIL HFA;VENTOLIN HFA) 108 (90 BASE) MCG/ACT inhaler Inhale 2 puffs into the lungs every 4 (four) hours as needed for wheezing or shortness of breath.     [provider]  benzonatate (TESSALON) 100 MG capsule Take 1 capsule (100 mg total) by mouth every 8 (eight) hours. 08/22/17   Khatri, Hina, PA-C  cephALEXin (KEFLEX) 500 MG capsule  Take 1 capsule (500 mg total) by mouth 3 (three) times daily. 09/16/17   Fayrene Helper, PA-C  diazepam (VALIUM) 5 MG tablet Take 10 mg by mouth every 12 (twelve) hours. 12/03/16   [provider]  HYDROmorphone (DILAUDID) 4 MG tablet Take 4 mg by mouth every 6 (six) hours.     [provider]  levofloxacin (LEVAQUIN) 750 MG tablet Take 1 tablet (750 mg total) by mouth daily. Patient not taking: Reported on 05/17/2017 04/02/17   Everlene Farrier, PA-C  LYRICA 50 MG capsule Take 50 mg by mouth 3 (three) times daily. 03/24/17   [provider]  nitrofurantoin (MACRODANTIN) 100 MG capsule Take 100 mg by mouth at bedtime.     [provider]  ondansetron (ZOFRAN ODT) 4 MG disintegrating tablet Take 1 tablet (4 mg  total) by mouth every 8 (eight) hours as needed for nausea or vomiting. 08/22/17   Khatri, Hina, PA-C  Oxycodone HCl 10 MG TABS Take 1 tablet (10 mg total) by mouth every 4 (four) hours as needed. Patient taking differently: Take 10 mg by mouth every 4 (four) hours as needed (pain).  05/15/17   Ihor Gully, MD  promethazine (PHENERGAN) 25 MG tablet Take 1 tablet (25 mg total) by mouth every 8 (eight) hours as needed for nausea or vomiting. 09/16/17   Fayrene Helper, PA-C  solifenacin (VESICARE) 10 MG tablet Take 10 mg by mouth every morning.     [provider]  zolpidem (AMBIEN) 10 MG tablet Take 10 mg by mouth at bedtime.     [provider]    Family History Family History  Problem Relation Age of Onset  . Heart disease Mother        Massive MI in 2005-05-10 ; died    Social History Social History  Substance Use Topics  . Smoking status: Never Smoker  . Smokeless tobacco: Never Used  . Alcohol use No     Allergies   Metoclopramide hcl; Benadryl [diphenhydramine hcl]; Gabapentin; and Morphine   Review of Systems Review of Systems  Gastrointestinal: Positive for abdominal pain.  Musculoskeletal: Positive for arthralgias.  All other systems reviewed and are negative.    Physical Exam Updated Vital Signs BP 103/71   Pulse 96   Temp 98.9 F (37.2 C) (Oral)   Resp 16   SpO2 100%   Physical Exam  Constitutional: She is oriented to person, place, and time. She appears well-developed and well-nourished. No distress.  HENT:  Head: Normocephalic and atraumatic.  Right Ear: Hearing normal.  Left Ear: Hearing normal.  Nose: Nose normal.  Mouth/Throat: Oropharynx is clear and moist and mucous membranes are normal.  Eyes: Pupils are equal, round, and reactive to light. Conjunctivae and EOM are normal.  Neck: Normal range of motion. Neck supple.  Cardiovascular: Regular rhythm, S1 normal and S2 normal.  Exam reveals no gallop and no friction rub.   No murmur  heard. Pulmonary/Chest: Effort normal and breath sounds normal. No respiratory distress. She exhibits no tenderness.  Abdominal: Soft. Normal appearance and bowel sounds are normal. There is no hepatosplenomegaly. There is generalized tenderness. There is no rebound, no guarding, no tenderness at McBurney's point and negative Murphy's sign. No hernia.  Musculoskeletal: She exhibits no edema or deformity.  Neurological: She is alert and oriented to person, place, and time. She has normal strength. No cranial nerve deficit. GCS eye subscore is 4. GCS verbal subscore is 5. GCS motor subscore is 6.  Skin: Skin  is warm and dry. No rash noted. No cyanosis.  Sacral and bilateral posterior pelvic decubiti, no drainage, no erythema  Psychiatric: She has a normal mood and affect. Her speech is normal and behavior is normal. Thought content normal.  Nursing note and vitals reviewed.    ED Treatments / Results  Labs (all labs ordered are listed, but only abnormal results are displayed) Labs Reviewed  CBC WITH DIFFERENTIAL/PLATELET - Abnormal; Notable for the following:       Result Value   Hemoglobin 9.8 (*)    HCT 32.1 (*)    MCV 69.2 (*)    MCH 21.1 (*)    RDW 17.8 (*)    All other components within normal limits  COMPREHENSIVE METABOLIC PANEL - Abnormal; Notable for the following:    Chloride 100 (*)    BUN 5 (*)    Total Protein 8.5 (*)    Albumin 3.4 (*)    ALT 8 (*)    All other components within normal limits    EKG  EKG Interpretation None       Radiology Ct Abdomen Pelvis W Contrast  Result Date: 09/16/2017 CLINICAL DATA:  Ongoing diffuse abdomen pain for months. EXAM: CT ABDOMEN AND PELVIS WITH CONTRAST TECHNIQUE: Multidetector CT imaging of the abdomen and pelvis was performed using the standard protocol following bolus administration of intravenous contrast. CONTRAST:  75mL ISOVUE-300 IOPAMIDOL (ISOVUE-300) INJECTION 61% COMPARISON:  May 17, 2017 FINDINGS: Lower chest: The  heart size is upper limits of normal. The lung bases are clear. Hepatobiliary: No focal liver abnormality is seen. No gallstones, gallbladder wall thickening, or biliary dilatation. Pancreas: Unremarkable. No pancreatic ductal dilatation or surrounding inflammatory changes. Spleen: Normal in size without focal abnormality. 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 There is no hydronephrosis. The bladder is decompressed with suprapubic catheter in place. Stomach/Bowel: There is no bowel obstruction. Extensive bowel content is identified throughout colon. The appendix is not seen but no inflammation is noted around the cecum. The stomach is normal. Vascular/Lymphatic: The IVC filter is identified. The aorta is normal. There is no abdominal lymphadenopathy. Reproductive: Uterus and bilateral adnexa are unremarkable. Other: Ulcerations and defects of the posterior bilateral lower buttock and upper thigh are identified with associated chronic changes underlying bones stable compared prior exam. Musculoskeletal: Ulcerations and defects of the posterior bilateral lower buttock and upper thigh are identified with associated chronic changes underlying bones stable compared prior exam. Harrington rods are identified. IMPRESSION: No acute abnormality identified in the abdomen and pelvis. No bowel obstruction. Marked constipation. 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 Electronically Signed   By: Sherian Rein M.D.   On: 09/16/2017 12:13   Dg Abd Acute W/chest  Result Date: 09/18/2017 CLINICAL DATA:  Constipation. EXAM: DG ABDOMEN ACUTE W/ 1V CHEST COMPARISON:  Abdominal CT 2 days prior. FINDINGS: Low lung volumes without consolidation. The heart is normal in size. Spinal fusion hardware in place. Enteric contrast throughout the colon from prior CT. Large colonic stool burden. No small bowel dilatation or obstruction. No free air. IVC filter  in place. IMPRESSION: 1. Large colonic stool burden. Enteric contrast throughout the colon from recent CT. No bowel obstruction or free air. 2. Low lung volumes without acute abnormality. Electronically Signed   By: Rubye Oaks M.D.   On: 09/18/2017 05:18   Dg Hip Unilat W Or Wo Pelvis 2-3 Views Left  Result Date: 09/18/2017 CLINICAL DATA:  Left hip  pain for 3 days. EXAM: DG HIP (WITH OR WITHOUT PELVIS) 2-3V LEFT COMPARISON:  CT 2 days prior 09/16/2017 FINDINGS: No evidence of acute fracture. Chronic deformity of the left ischium related to sacral decubitus ulcer, as seen on recent CT. Valgus deformity of the proximal femur. The right ischial decubitus ulcer on CT is not well seen radiographically. Enteric contrast throughout the colon from prior CT obscures the upper pelvis. IMPRESSION: No evidence of acute abnormality. Chronic left ischial decubitus ulcer and ischial irregularity, as characterized on recent abdominal CT. Electronically Signed   By: Rubye Oaks M.D.   On: 09/18/2017 05:17    Procedures Fecal disimpaction Date/Time: 09/18/2017 7:17 AM Performed by: Gilda Crease Authorized by: Gilda Crease  Consent: Verbal consent obtained. Risks and benefits: risks, benefits and alternatives were discussed Consent given by: patient Patient understanding: patient states understanding of the procedure being performed Patient consent: the patient's understanding of the procedure matches consent given Procedure consent: procedure consent matches procedure scheduled Test results: test results available and properly labeled Site marked: the operative site was marked Imaging studies: imaging studies available Required items: required blood products, implants, devices, and special equipment available Patient identity confirmed: hospital-assigned identification number Time out: Immediately prior to procedure a "time out" was called to verify the correct patient, procedure,  equipment, support staff and site/side marked as required. Comments: Manual fecal disimpaction (digital) performed    (including critical care time)  Medications Ordered in ED Medications  sodium chloride 0.9 % bolus 500 mL (500 mLs Intravenous New Bag/Given 09/18/17 0516)     Initial Impression / Assessment and Plan / ED Course  I have reviewed the triage vital signs and the nursing notes.  Pertinent labs & imaging results that were available during my care of the patient were reviewed by me and considered in my medical decision making (see chart for details).     Patient presents to the emergency department for evaluation of abdominal pain and hip pain. Patient has a history of partial quadriplegia. She needs frequent manual disimpactions, normally performed by her fianc, but he is currently in the hospital. She has not had this procedure performed for several days, having increased abdominal distention and pain. I did perform a manual disimpaction and she felt improvement.  Patient also complaining of left hip pain. This has been chronic for one year. She has multiple decubiti ulcers in a history of infection in the region. Examination of the wound reveals healthy granulation tissue present without any drainage. No obvious infection. X-ray unremarkable compared to previous.  Patient reports that she feels dehydrated. She was administered IV fluids. She was administered analgesia and will be discharged.  Final Clinical Impressions(s) / ED Diagnoses   Final diagnoses:  Dehydration  Slow transit constipation    New Prescriptions New Prescriptions   No medications on file     Gilda Crease, MD 09/18/17 1610    Gilda Crease, MD 10/23/17 [phone]

## 2017-09-18 NOTE — ED Notes (Signed)
ED Provider at bedside. 

## 2017-09-18 NOTE — ED Notes (Signed)
RN witnessed Disimpaction by Dr.Pollina

## 2017-09-18 NOTE — ED Notes (Signed)
RN attempted IV start x 2; Pt transported to xray

## 2017-09-19 ENCOUNTER — Telehealth: Payer: Self-pay

## 2017-09-19 NOTE — Telephone Encounter (Signed)
Post ED Visit - Positive Culture Follow-up: Successful Patient Follow-Up  Culture assessed and recommendations reviewed by:  Enzo Bi, Pharm.D.  Celedonio Miyamoto, Pharm.D., BCPS AQ-ID  Garvin Fila, Pharm.D., BCPS  Georgina Pillion, Pharm.D., BCPS  [place], [address].D., BCPS, AAHIVP  Estella Husk, Pharm.D., BCPS, AAHIVP  Lysle Pearl, PharmD, BCPS  Casilda Carls, PharmD, BCPS  Pollyann Samples, PharmD, BCPS  Positive urine culture   Patient discharged without antimicrobial prescription and treatment is now indicated  Organism is resistant to prescribed ED discharge antimicrobial  Patient with positive blood cultures  Changes discussed with ED provider: Arthor Captain [facility] New antibiotic prescription Levoflox Called to [facility] [phone]  Contacted patient, date 09/19/17, time 1123   Jennise Both, Linnell Fulling 09/19/2017, 11:21 AM

## 2017-09-19 NOTE — Progress Notes (Signed)
ED Antimicrobial Stewardship Positive Culture Follow Up   [name] is an 31 y.o. female who presented to [facility] on 09/18/2017 with a chief complaint of  Chief Complaint  Patient presents with  . Hip Pain  . Abdominal Pain    Recent Results (from the past 720 hour(s))  Urine Culture     Status: Abnormal   Collection Time: 09/16/17  7:15 AM  Result Value Ref Range Status   Specimen Description URINE, RANDOM  Final   Special Requests NONE  Final   Culture (A)  Final    >=100,000 COLONIES/mL ESCHERICHIA COLI >=100,000 COLONIES/mL PSEUDOMONAS AERUGINOSA    Report Status 09/18/2017 FINAL  Final   Organism ID, Bacteria ESCHERICHIA COLI (A)  Final   Organism ID, Bacteria PSEUDOMONAS AERUGINOSA (A)  Final      Susceptibility   Escherichia coli - MIC*    AMPICILLIN <=2 SENSITIVE Sensitive     CEFAZOLIN <=4 SENSITIVE Sensitive     CEFTRIAXONE <=1 SENSITIVE Sensitive     CIPROFLOXACIN <=0.25 SENSITIVE Sensitive     GENTAMICIN <=1 SENSITIVE Sensitive     IMIPENEM <=0.25 SENSITIVE Sensitive     NITROFURANTOIN <=16 SENSITIVE Sensitive     TRIMETH/SULFA <=20 SENSITIVE Sensitive     AMPICILLIN/SULBACTAM <=2 SENSITIVE Sensitive     PIP/TAZO <=4 SENSITIVE Sensitive     Extended ESBL NEGATIVE Sensitive     * >=100,000 COLONIES/mL ESCHERICHIA COLI   Pseudomonas aeruginosa - MIC*    CEFTAZIDIME 2 SENSITIVE Sensitive     CIPROFLOXACIN <=0.25 SENSITIVE Sensitive     GENTAMICIN <=1 SENSITIVE Sensitive     IMIPENEM 1 SENSITIVE Sensitive     PIP/TAZO <=4 SENSITIVE Sensitive     CEFEPIME <=1 SENSITIVE Sensitive     * >=100,000 COLONIES/mL PSEUDOMONAS AERUGINOSA     Treated with cephalexin, organism resistant to prescribed antimicrobial  Patient discharged originally without antimicrobial agent and treatment is now indicated  New antibiotic prescription: levofloxacin 250 mg daily x 5 days  ED Provider: Arthor Captain, PA-C  Bertram Millard 09/19/2017, 9:56 AM Infectious  Diseases Pharmacist Phone# [phone]

## 2017-10-16 ENCOUNTER — Other Ambulatory Visit: Payer: Self-pay | Admitting: Physician Assistant

## 2017-10-16 DIAGNOSIS — R1310 Dysphagia, unspecified: Secondary | ICD-10-CM

## 2017-10-16 DIAGNOSIS — R1012 Left upper quadrant pain: Secondary | ICD-10-CM

## 2017-10-23 ENCOUNTER — Other Ambulatory Visit: Payer: Medicare Other

## 2017-10-30 ENCOUNTER — Ambulatory Visit
Admission: RE | Admit: 2017-10-30 | Discharge: 2017-10-30 | Disposition: A | Discharge status: Home / Self Care | Payer: Medicare Other | Source: Ambulatory Visit | Attending: Physician Assistant | Admitting: Physician Assistant

## 2017-10-30 DIAGNOSIS — R1012 Left upper quadrant pain: Secondary | ICD-10-CM

## 2017-10-30 DIAGNOSIS — R1310 Dysphagia, unspecified: Secondary | ICD-10-CM

## 2017-12-08 ENCOUNTER — Other Ambulatory Visit: Payer: Self-pay | Admitting: Gastroenterology

## 2017-12-28 ENCOUNTER — Encounter (HOSPITAL_COMMUNITY): Payer: Self-pay | Admitting: Emergency Medicine

## 2017-12-28 ENCOUNTER — Other Ambulatory Visit: Payer: Self-pay

## 2018-01-05 ENCOUNTER — Other Ambulatory Visit: Payer: Self-pay | Admitting: Gastroenterology

## 2018-01-06 ENCOUNTER — Encounter (HOSPITAL_COMMUNITY): Payer: Self-pay | Admitting: Anesthesiology

## 2018-01-06 NOTE — Anesthesia Preprocedure Evaluation (Addendum)
Anesthesia Evaluation  Patient identified by MRN, date of birth, ID band Patient awake    Reviewed: Allergy & Precautions, NPO status , Patient's Chart, lab work & pertinent test results  Airway Mallampati: II  TM Distance: >3 FB Neck ROM: Limited    Dental  (+) Poor Dentition   Pulmonary asthma ,    Pulmonary exam normal breath sounds clear to auscultation       Cardiovascular negative cardio ROS Normal cardiovascular exam Rhythm:Regular Rate:Normal     Neuro/Psych Autonomic dysreflexia Hx/o spinal cord injury  Neuromuscular disease negative psych ROS   GI/Hepatic Neg liver ROS, Pharyngoesophageal dysphagia   Endo/Other  negative endocrine ROS  Renal/GU Renal diseaseHx/o Horseshoe kidney- congenital absence right kidney Hx/o Renal calculi - indwelling ureteral calculus Recurrent UTI Bladder dysfunction  Urinary incontinence - Neurogenic bladder Hx/o suprapubic catheter    Musculoskeletal Hx/o scoliosis- S/P Multiple surgeries inluding Harrington rods Hx/o cervical stabilization - Rods C3-C6 Decubitus ulcers Atrophy of muscles upper extremities and lower extremities   Abdominal   Peds  Hematology  (+) anemia ,   Anesthesia Other Findings Lower permanent retainer  Reproductive/Obstetrics                          Anesthesia Physical Anesthesia Plan  ASA: III  Anesthesia Plan: MAC   Post-op Pain Management:    Induction: Intravenous  PONV Risk Score and Plan: 2 and Propofol infusion, Ondansetron and Treatment may vary due to age or medical condition  Airway Management Planned: Natural Airway and Nasal Cannula  Additional Equipment:   Intra-op Plan:   Post-operative Plan:   Informed Consent: I have reviewed the patients History and Physical, chart, labs and discussed the procedure including the risks, benefits and alternatives for the proposed anesthesia with the patient or  authorized representative who has indicated his/her understanding and acceptance.   Dental advisory given  Plan Discussed with: Anesthesiologist, CRNA and Surgeon  Anesthesia Plan Comments:        Anesthesia Quick Evaluation

## 2018-01-07 ENCOUNTER — Encounter (HOSPITAL_COMMUNITY)
Admission: RE | Disposition: A | Discharge status: Home / Self Care | Payer: Self-pay | Source: Ambulatory Visit | Attending: Gastroenterology

## 2018-01-07 ENCOUNTER — Encounter (HOSPITAL_COMMUNITY): Payer: Self-pay | Admitting: Certified Registered Nurse Anesthetist

## 2018-01-07 ENCOUNTER — Other Ambulatory Visit: Payer: Self-pay

## 2018-01-07 ENCOUNTER — Ambulatory Visit (HOSPITAL_COMMUNITY): Payer: Medicare Other

## 2018-01-07 ENCOUNTER — Ambulatory Visit (HOSPITAL_COMMUNITY): Payer: Medicare Other | Admitting: Anesthesiology

## 2018-01-07 ENCOUNTER — Ambulatory Visit (HOSPITAL_COMMUNITY)
Admission: RE | Admit: 2018-01-07 | Discharge: 2018-01-07 | Disposition: A | Discharge status: Home / Self Care | Payer: Medicare Other | Source: Ambulatory Visit | Attending: Gastroenterology | Admitting: Gastroenterology

## 2018-01-07 DIAGNOSIS — R1310 Dysphagia, unspecified: Secondary | ICD-10-CM | POA: Diagnosis present

## 2018-01-07 DIAGNOSIS — Z888 Allergy status to other drugs, medicaments and biological substances status: Secondary | ICD-10-CM | POA: Diagnosis not present

## 2018-01-07 DIAGNOSIS — K222 Esophageal obstruction: Secondary | ICD-10-CM | POA: Diagnosis not present

## 2018-01-07 DIAGNOSIS — Z885 Allergy status to narcotic agent status: Secondary | ICD-10-CM | POA: Diagnosis not present

## 2018-01-07 DIAGNOSIS — Z79899 Other long term (current) drug therapy: Secondary | ICD-10-CM | POA: Insufficient documentation

## 2018-01-07 DIAGNOSIS — Z79891 Long term (current) use of opiate analgesic: Secondary | ICD-10-CM | POA: Diagnosis not present

## 2018-01-07 DIAGNOSIS — J45909 Unspecified asthma, uncomplicated: Secondary | ICD-10-CM | POA: Insufficient documentation

## 2018-01-07 DIAGNOSIS — K210 Gastro-esophageal reflux disease with esophagitis: Secondary | ICD-10-CM | POA: Diagnosis not present

## 2018-01-07 DIAGNOSIS — R079 Chest pain, unspecified: Secondary | ICD-10-CM

## 2018-01-07 DIAGNOSIS — G8929 Other chronic pain: Secondary | ICD-10-CM | POA: Diagnosis not present

## 2018-01-07 HISTORY — PX: ESOPHAGOGASTRODUODENOSCOPY (EGD) WITH PROPOFOL: SHX5813

## 2018-01-07 LAB — PREGNANCY, URINE: Preg Test, Ur: NEGATIVE

## 2018-01-07 SURGERY — ESOPHAGOGASTRODUODENOSCOPY (EGD) WITH PROPOFOL
Anesthesia: Monitor Anesthesia Care

## 2018-01-07 MED ORDER — ONDANSETRON HCL 4 MG/2ML IJ SOLN
INTRAMUSCULAR | Status: AC
  Filled 2018-01-07: qty 2

## 2018-01-07 MED ORDER — FENTANYL CITRATE (PF) 100 MCG/2ML IJ SOLN
INTRAMUSCULAR | Status: AC
  Filled 2018-01-07: qty 2

## 2018-01-07 MED ORDER — SODIUM CHLORIDE 0.9 % IV SOLN: INTRAVENOUS | Status: DC

## 2018-01-07 MED ORDER — PROPOFOL 500 MG/50ML IV EMUL
INTRAVENOUS | Status: DC | PRN
  Administered 2018-01-07: 50 ug/kg/min via INTRAVENOUS

## 2018-01-07 MED ORDER — LACTATED RINGERS IV SOLN
INTRAVENOUS | Status: DC
  Administered 2018-01-07: 08:00:00 via INTRAVENOUS

## 2018-01-07 MED ORDER — PROPOFOL 10 MG/ML IV BOLUS
INTRAVENOUS | Status: AC
Start: 2018-01-07 — End: 2018-01-07
  Filled 2018-01-07: qty 60

## 2018-01-07 MED ORDER — ONDANSETRON HCL 4 MG/2ML IJ SOLN
4.0000 mg | Freq: Once | INTRAMUSCULAR | Status: AC
  Administered 2018-01-07: 4 mg via INTRAVENOUS

## 2018-01-07 MED ORDER — FENTANYL CITRATE (PF) 100 MCG/2ML IJ SOLN
INTRAMUSCULAR | Status: DC | PRN
  Administered 2018-01-07 (×2): 50 ug via INTRAVENOUS

## 2018-01-07 SURGICAL SUPPLY — 14 items

## 2018-01-07 NOTE — Progress Notes (Signed)
Pt complains of chest pain. Notified Dr. Levora AngelBrahmbhatt. He is going to see patient. Will continue to monitor.

## 2018-01-07 NOTE — H&P (Signed)
Primary Care Physician:  System, Pcp Not In Primary Gastroenterologist:  Dr. Levora Angel  Reason for Visit :   Dysphagia   HPI: [name] is a 32 y.o. female here for follow-up for EGD with possible dilatation for dysphagia and abnormal upper GI/barium swallow. Patient reports solid food dysphagia episodes 2 or 3 times per month for several years. 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 She is also complaining of mild epigastric to left upper quadrant discomfort after certain foods. She was started on PPI with some improvement in symptoms. Occasional nausea and vomiting with sensation of food getting stuck. Occasional constipation. Denied any diarrhea. She underwent upper GI series which showed proximal esophageal web and barium pill was not able to pass through this area.    Past Medical History:  Diagnosis Date  . Abnormal Pap smear   . Anemia   . ASCUS (atypical squamous cells of undetermined significance) on Pap smear 10/2011  . Asthma Mild, only with exposure to cigarette smoke  . Autonomic dysreflexia   . Chronic pain 2005  . Congenital absence of right kidney   . Decubitus ulcer of buttock, stage 2    bilateral-nondraining-redressed q other day  . Decubitus ulcer of coccygeal region, stage 1   . H/O scoliosis   . Horseshoe kidney   . Hx: UTI (urinary tract infection) 11/27/11  . Recurrent nephrolithiasis    s/p stent  . Recurrent UTI requires self catheterization  . Sciatica   . Spinal cord injury, cervical region (HCC) 05/19/2004   movement,sensation intact-unable to stand/ transfer since 2017 after septic shock episode per pt  . Urinary tract infection 03/2017    Past Surgical History:  Procedure Laterality Date  . BACK SURGERY  Scoliosis, Harrington Rods in place  . Bilateral leg surgery    . C3-6 spine surgery with rods    . dvt filter placement  2005  . INSERTION OF SUPRAPUBIC CATHETER N/A 05/15/2017   Procedure: INSERTION OF  SUPRAPUBIC CATHETER;  Surgeon: Ihor Gully, MD;  Location: [facility];  Service: Urology;  Laterality: N/A;  . LITHOTRIPSY    . ORTHOPEDIC SURGERY  Multiple lower extremity surgeries as a child  . VASCULAR SURGERY  IVC filter placed in 2005    Prior to Admission medications   Medication Sig Start Date End Date Taking? Authorizing Provider  albuterol (PROVENTIL HFA;VENTOLIN HFA) 108 (90 BASE) MCG/ACT inhaler Inhale 2 puffs into the lungs every 4 (four) hours as needed for wheezing or shortness of breath.    Yes [provider]  diazepam (VALIUM) 5 MG tablet Take 10 mg by mouth every 12 (twelve) hours. 12/03/16  Yes [provider]  HYDROmorphone (DILAUDID) 4 MG tablet Take 4 mg by mouth every 6 (six) hours.    Yes [provider]  ibuprofen (ADVIL,MOTRIN) 200 MG tablet Take 200 mg by mouth daily as needed for moderate pain.   Yes [provider]  nitrofurantoin (MACRODANTIN) 100 MG capsule Take 100 mg by mouth at bedtime.    Yes [provider]  pantoprazole (PROTONIX) 20 MG tablet Take 20 mg by mouth daily.   Yes [provider]  pregabalin (LYRICA) 75 MG capsule Take 75 mg by mouth 3 (three) times daily.   Yes [provider]  solifenacin (VESICARE) 10 MG tablet Take 10 mg by mouth every morning.    Yes [provider]  benzonatate (TESSALON) 100 MG capsule Take 1 capsule (100 mg  total) by mouth every 8 (eight) hours. Patient not taking: Reported on 12/30/2017 08/22/17   Dietrich Pates, PA-C  cephALEXin (KEFLEX) 500 MG capsule Take 1 capsule (500 mg total) by mouth 3 (three) times daily. Patient not taking: Reported on 12/30/2017 09/16/17   Fayrene Helper, PA-C  ondansetron (ZOFRAN ODT) 4 MG disintegrating tablet Take 1 tablet (4 mg total) by mouth every 8 (eight) hours as needed for nausea or vomiting. Patient not taking: Reported on 12/30/2017 08/22/17   Dietrich Pates, PA-C  promethazine (PHENERGAN) 25 MG tablet Take 1  tablet (25 mg total) by mouth every 8 (eight) hours as needed for nausea or vomiting. Patient not taking: Reported on 12/30/2017 09/16/17   Fayrene Helper, PA-C    Scheduled Meds: Continuous Infusions: . lactated ringers     PRN Meds:.  Allergies as of 12/08/2017 - Review Complete 09/18/2017  Allergen Reaction Noted  . Metoclopramide hcl Other (See Comments) 01/30/2012  . Benadryl [diphenhydramine hcl] Rash 01/30/2012  . Gabapentin Swelling 02/01/2016  . Morphine Rash 02/01/2016    Family History  Problem Relation Age of Onset  . Heart disease Mother        Massive MI in May 28, 2005 ; died    Social History   Socioeconomic History  . Marital status: Single    Spouse name: Not on file  . Number of children: Not on file  . Years of education: Not on file  . Highest education level: Not on file  Social Needs  . Financial resource strain: Not on file  . Food insecurity - worry: Not on file  . Food insecurity - inability: Not on file  . Transportation needs - medical: Not on file  . Transportation needs - non-medical: Not on file  Occupational History  . Not on file  Tobacco Use  . Smoking status: Never Smoker  . Smokeless tobacco: Never Used  Substance and Sexual Activity  . Alcohol use: No  . Drug use: No  . Sexual activity: Yes    Partners: Male    Birth control/protection: None  Other Topics Concern  . Not on file  Social History Narrative  . Not on file    Review of Systems: All negative except as stated above in HPI.  Physical Exam: Vital signs: Vitals:   01/07/18 0725  BP: (!) 88/41  Pulse: 100  Resp: 16  Temp: 99 F (37.2 C)  SpO2: 100%     General:   Alert,   cooperative in NAD Lungs:  Clear throughout to auscultation.   No wheezes, crackles, or rhonchi. No acute distress. Heart:  Regular rate and rhythm; no murmurs, clicks, rubs,  or gallops. Abdomen: soft, NT, ND, BS +  Rectal:  Deferred  GI:  Lab Results: No results for input(s): WBC, HGB, HCT,  PLT in the last 72 hours. BMET No results for input(s): NA, K, CL, CO2, GLUCOSE, BUN, CREATININE, CALCIUM in the last 72 hours. LFT No results for input(s): PROT, ALBUMIN, AST, ALT, ALKPHOS, BILITOT, BILIDIR, IBILI in the last 72 hours. PT/INR No results for input(s): LABPROT, INR in the last 72 hours.   Studies/Results: No results found.  Impression/Plan:  Dysphagia  Abnormal Upper GI series   Plan  ----- EGD with dilatation today.   Risks (bleeding, infection, bowel perforation that could require surgery, sedation-related changes in cardiopulmonary systems), benefits (identification and possible treatment of source of symptoms, exclusion of certain causes of symptoms), and alternatives (watchful waiting, radiographic imaging studies, empiric medical treatment)  were explained to patient/family in detail and patient wishes to proceed.    LOS: 0 days   Kathi DerParag Jakolby Sedivy  MD, FACP 01/07/2018, 8:19 AM  Contact #  [phone]

## 2018-01-07 NOTE — Transfer of Care (Signed)
Immediate Anesthesia Transfer of Care Note  Patient: [name]  Procedure(s) Performed: ESOPHAGOGASTRODUODENOSCOPY (EGD) WITH PROPOFOL (N/A )  Patient Location: PACU and Endoscopy Unit  Anesthesia Type:MAC  Level of Consciousness: awake  Airway & Oxygen Therapy: Patient Spontanous Breathing and Patient connected to nasal cannula oxygen  Post-op Assessment: Report given to RN and Post -op Vital signs reviewed and stable  Post vital signs: Reviewed and stable  Last Vitals:  Vitals:   01/07/18 0725 01/07/18 0858  BP: (!) 88/41 (!) 101/48  Pulse: 100   Resp: 16   Temp: 37.2 C 36.5 C  SpO2: 100%     Last Pain:  Vitals:   01/07/18 0858  TempSrc: Oral  PainSc:          Complications: No apparent anesthesia complications

## 2018-01-07 NOTE — Op Note (Signed)
[facility] Patient Name: [name] Procedure Date: 01/07/2018 MRN: [id number] Attending MD: Kathi Der , MD Date of Birth: [birth year] CSN: [id number] Age: 32 Admit Type: Inpatient Procedure:                Upper GI endoscopy Indications:              Dysphagia, Abnormal UGI series Providers:                Kathi Der, MD, Tillie Fantasia, RN, Margo Aye, Technician, Stephanie [place], CRNA Referring MD:              Medicines:                Sedation Administered by an Anesthesia Professional Complications:            Patient develop tachycardia and borderline low                            blood pressure which was subsequently normalized. Estimated Blood Loss:     Estimated blood loss was minimal. Procedure:                Pre-Anesthesia Assessment:                           - Prior to the procedure, a History and Physical                            was performed, and patient medications and                            allergies were reviewed. The patient's tolerance of                            previous anesthesia was also reviewed. The risks                            and benefits of the procedure and the sedation                            options and risks were discussed with the patient.                            All questions were answered, and informed consent                            was obtained. Prior Anticoagulants: The patient has                            taken no previous anticoagulant or antiplatelet                            agents. ASA Grade Assessment: III - A patient with  severe systemic disease. After reviewing the risks                            and benefits, the patient was deemed in                            satisfactory condition to undergo the procedure.                           After obtaining informed consent, the endoscope was                             passed under direct vision. Throughout the                            procedure, the patient's blood pressure, pulse, and                            oxygen saturations were monitored continuously. The                            EG-[id number] ([id number]) scope was introduced through the                            mouth, and advanced to the second part of duodenum.                            The upper GI endoscopy was accomplished without                            difficulty. The patient tolerated the procedure                            fairly well. Scope In: Scope Out: Findings:      Esophagitis with no bleeding was found in the distal esophagus. This       could be from small amount of retained food/food coating which was seen       in distal esophagus. There was also small amount of food coating       proximal esophagus. There was no evidence of esophageal narrowing or web       in the proximal esophagus. There was no evidence of esophagitis in the       middle or proximal esophagus.      One mild (non-circumferential scarring) benign-appearing, intrinsic       stenosis was found in the distal esophagus. And was traversed.      Normal mucosa was found in the entire examined stomach.      The cardia and gastric fundus were normal on retroflexion.      Normal mucosa was found in the entire duodenum. Patient subsequently       started notably tachycardia with heart rate in 120s and borderline low       blood pressure. Biopsies and dilatation was not performed Impression:               - Reflux esophagitis.                           -  Benign-appearing esophageal stenosis.                           - Normal mucosa was found in the entire stomach.                           - Normal mucosa was found in the entire examined                            duodenum.                           - No specimens collected. Moderate Sedation:      Moderate (conscious) sedation was personally administered by an        anesthesia professional. The following parameters were monitored: oxygen       saturation, heart rate, blood pressure, and response to care. Recommendation:           - Patient has a contact number available for                            emergencies. The signs and symptoms of potential                            delayed complications were discussed with the                            patient. Return to normal activities tomorrow.                            Written discharge instructions were provided to the                            patient.                           - Resume previous diet.                           - Continue present medications.                           - Return to my office as previously scheduled. Procedure Code(s):        --- Professional ---                           [phone], Esophagogastroduodenoscopy, flexible,                            transoral; diagnostic, including collection of                            specimen(s) by brushing or washing, when performed                            (separate procedure) Diagnosis Code(s):        --- Professional ---  K21.0, Gastro-esophageal reflux disease with                            esophagitis                           K22.2, Esophageal obstruction                           R13.10, Dysphagia, unspecified                           R93.3, Abnormal findings on diagnostic imaging of                            other parts of digestive tract CPT copyright 2016 [employer]. All rights reserved. The codes documented in this report are preliminary and upon coder review may  be revised to meet current compliance requirements. Kathi DerParag Sherel Fennell, MD Kathi DerParag Malaka Ruffner, MD 01/07/2018 9:01:56 AM Number of Addenda: 0

## 2018-01-07 NOTE — Anesthesia Postprocedure Evaluation (Signed)
Anesthesia Post Note  Patient: [name]  Procedure(s) Performed: ESOPHAGOGASTRODUODENOSCOPY (EGD) WITH PROPOFOL (N/A )     Patient location during evaluation: PACU Anesthesia Type: MAC Level of consciousness: awake and alert and oriented Pain management: pain level controlled Vital Signs Assessment: post-procedure vital signs reviewed and stable Respiratory status: spontaneous breathing, nonlabored ventilation and respiratory function stable Cardiovascular status: stable and blood pressure returned to baseline Anesthetic complications: no    Last Vitals:  Vitals:   01/07/18 0725 01/07/18 0858  BP: (!) 88/41 (!) 101/48  Pulse: 100   Resp: 16   Temp: 37.2 C 36.5 C  SpO2: 100%     Last Pain:  Vitals:   01/07/18 0858  TempSrc: Oral  PainSc:                  Sumaya Riedesel A.

## 2018-01-07 NOTE — Discharge Instructions (Signed)

## 2018-01-08 ENCOUNTER — Encounter (HOSPITAL_COMMUNITY): Payer: Self-pay | Admitting: Gastroenterology

## 2018-01-18 ENCOUNTER — Inpatient Hospital Stay (HOSPITAL_COMMUNITY)
Admission: EM | Admit: 2018-01-18 | Discharge: 2018-01-22 | DRG: 871 | Disposition: A | Discharge status: Home Health | Payer: Medicare Other | Attending: Internal Medicine | Admitting: Internal Medicine

## 2018-01-18 ENCOUNTER — Other Ambulatory Visit: Payer: Self-pay

## 2018-01-18 DIAGNOSIS — S14105S Unspecified injury at C5 level of cervical spinal cord, sequela: Secondary | ICD-10-CM

## 2018-01-18 DIAGNOSIS — D62 Acute posthemorrhagic anemia: Secondary | ICD-10-CM | POA: Diagnosis present

## 2018-01-18 DIAGNOSIS — G8250 Quadriplegia, unspecified: Secondary | ICD-10-CM

## 2018-01-18 DIAGNOSIS — S14109A Unspecified injury at unspecified level of cervical spinal cord, initial encounter: Secondary | ICD-10-CM | POA: Diagnosis present

## 2018-01-18 DIAGNOSIS — A419 Sepsis, unspecified organism: Secondary | ICD-10-CM | POA: Diagnosis not present

## 2018-01-18 DIAGNOSIS — T148XXA Other injury of unspecified body region, initial encounter: Secondary | ICD-10-CM

## 2018-01-18 DIAGNOSIS — M869 Osteomyelitis, unspecified: Secondary | ICD-10-CM

## 2018-01-18 DIAGNOSIS — L89219 Pressure ulcer of right hip, unspecified stage: Secondary | ICD-10-CM | POA: Diagnosis present

## 2018-01-18 DIAGNOSIS — L89229 Pressure ulcer of left hip, unspecified stage: Secondary | ICD-10-CM | POA: Diagnosis present

## 2018-01-18 DIAGNOSIS — L89329 Pressure ulcer of left buttock, unspecified stage: Secondary | ICD-10-CM | POA: Diagnosis present

## 2018-01-18 DIAGNOSIS — L089 Local infection of the skin and subcutaneous tissue, unspecified: Secondary | ICD-10-CM

## 2018-01-18 DIAGNOSIS — Z79899 Other long term (current) drug therapy: Secondary | ICD-10-CM

## 2018-01-18 DIAGNOSIS — M868X5 Other osteomyelitis, thigh: Secondary | ICD-10-CM | POA: Diagnosis present

## 2018-01-18 DIAGNOSIS — Z981 Arthrodesis status: Secondary | ICD-10-CM

## 2018-01-18 DIAGNOSIS — Z885 Allergy status to narcotic agent status: Secondary | ICD-10-CM

## 2018-01-18 DIAGNOSIS — N390 Urinary tract infection, site not specified: Secondary | ICD-10-CM | POA: Diagnosis present

## 2018-01-18 DIAGNOSIS — D649 Anemia, unspecified: Secondary | ICD-10-CM

## 2018-01-18 DIAGNOSIS — L8944 Pressure ulcer of contiguous site of back, buttock and hip, stage 4: Secondary | ICD-10-CM

## 2018-01-18 DIAGNOSIS — J45909 Unspecified asthma, uncomplicated: Secondary | ICD-10-CM | POA: Diagnosis present

## 2018-01-18 DIAGNOSIS — L8990 Pressure ulcer of unspecified site, unspecified stage: Secondary | ICD-10-CM

## 2018-01-18 DIAGNOSIS — Z993 Dependence on wheelchair: Secondary | ICD-10-CM

## 2018-01-18 DIAGNOSIS — L89319 Pressure ulcer of right buttock, unspecified stage: Secondary | ICD-10-CM | POA: Diagnosis present

## 2018-01-18 DIAGNOSIS — L89154 Pressure ulcer of sacral region, stage 4: Secondary | ICD-10-CM | POA: Diagnosis present

## 2018-01-18 DIAGNOSIS — D509 Iron deficiency anemia, unspecified: Secondary | ICD-10-CM | POA: Diagnosis present

## 2018-01-18 DIAGNOSIS — Z888 Allergy status to other drugs, medicaments and biological substances status: Secondary | ICD-10-CM

## 2018-01-18 HISTORY — DX: Quadriplegia, unspecified: G82.50

## 2018-01-18 HISTORY — DX: Reserved for concepts with insufficient information to code with codable children: IMO0002

## 2018-01-18 LAB — CBC WITH DIFFERENTIAL/PLATELET
BASOS PCT: 0 %
Basophils Absolute: 0 10*3/uL (ref 0.0–0.1)
EOS PCT: 0 %
Eosinophils Absolute: 0 10*3/uL (ref 0.0–0.7)
HEMATOCRIT: 26.2 % — AB (ref 36.0–46.0)
HEMOGLOBIN: 7.7 g/dL — AB (ref 12.0–15.0)
LYMPHS PCT: 13 %
Lymphs Abs: 0.9 10*3/uL (ref 0.7–4.0)
MCH: 19.4 pg — ABNORMAL LOW (ref 26.0–34.0)
MCHC: 29.4 g/dL — ABNORMAL LOW (ref 30.0–36.0)
MCV: 66 fL — ABNORMAL LOW (ref 78.0–100.0)
Monocytes Absolute: 0.2 10*3/uL (ref 0.1–1.0)
Monocytes Relative: 3 %
NEUTROS PCT: 84 %
Neutro Abs: 6 10*3/uL (ref 1.7–7.7)
Platelets: 185 10*3/uL (ref 150–400)
RBC: 3.97 MIL/uL (ref 3.87–5.11)
RDW: 20.5 % — ABNORMAL HIGH (ref 11.5–15.5)
WBC: 7.1 10*3/uL (ref 4.0–10.5)

## 2018-01-18 LAB — COMPREHENSIVE METABOLIC PANEL
ALBUMIN: 2.6 g/dL — AB (ref 3.5–5.0)
ALT: 13 U/L — AB (ref 14–54)
ANION GAP: 7 (ref 5–15)
AST: 21 U/L (ref 15–41)
Alkaline Phosphatase: 60 U/L (ref 38–126)
BUN: 13 mg/dL (ref 6–20)
CHLORIDE: 100 mmol/L — AB (ref 101–111)
CO2: 27 mmol/L (ref 22–32)
CREATININE: 0.47 mg/dL (ref 0.44–1.00)
Calcium: 8.1 mg/dL — ABNORMAL LOW (ref 8.9–10.3)
GFR calc non Af Amer: >60 mL/min (ref >60)
Glucose, Bld: 109 mg/dL — ABNORMAL HIGH (ref 65–99)
Potassium: 3.4 mmol/L — ABNORMAL LOW (ref 3.5–5.1)
SODIUM: 134 mmol/L — AB (ref 135–145)
Total Bilirubin: 0.4 mg/dL (ref 0.3–1.2)
Total Protein: 7.3 g/dL (ref 6.5–8.1)

## 2018-01-18 LAB — I-STAT BETA HCG BLOOD, ED (MC, WL, AP ONLY): I-stat hCG, quantitative: 6.5 m[IU]/mL — ABNORMAL HIGH (ref <5)

## 2018-01-18 LAB — I-STAT CG4 LACTIC ACID, ED: LACTIC ACID, VENOUS: 1.05 mmol/L (ref 0.5–1.9)

## 2018-01-18 MED ORDER — SODIUM CHLORIDE 0.9 % IV BOLUS (SEPSIS)
250.0000 mL | Freq: Once | INTRAVENOUS | Status: AC
  Administered 2018-01-18: 250 mL via INTRAVENOUS

## 2018-01-18 MED ORDER — FENTANYL CITRATE (PF) 100 MCG/2ML IJ SOLN
50.0000 ug | Freq: Once | INTRAMUSCULAR | Status: AC
  Administered 2018-01-18: 50 ug via INTRAVENOUS
  Filled 2018-01-18: qty 2

## 2018-01-18 MED ORDER — PIPERACILLIN-TAZOBACTAM 3.375 G IVPB 30 MIN
3.3750 g | Freq: Once | INTRAVENOUS | Status: AC
  Administered 2018-01-18: 3.375 g via INTRAVENOUS
  Filled 2018-01-18: qty 50

## 2018-01-18 MED ORDER — VANCOMYCIN HCL IN DEXTROSE 1-5 GM/200ML-% IV SOLN
1000.0000 mg | Freq: Once | INTRAVENOUS | Status: AC
  Administered 2018-01-18: 1000 mg via INTRAVENOUS
  Filled 2018-01-18: qty 200

## 2018-01-18 MED ORDER — SODIUM CHLORIDE 0.9 % IV BOLUS (SEPSIS)
1000.0000 mL | Freq: Once | INTRAVENOUS | Status: AC
  Administered 2018-01-18: 1000 mL via INTRAVENOUS
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
## 2018-01-18 NOTE — ED Triage Notes (Signed)
Pt arriving from home with complaint of fever with chills for the last few days. 105.8 Temp prior to arrival. Pt has indwelling cath. 1000 mg Tylenol PO prior to arrival given by EMS. Pt reports not taking any medications at home.   102/63- Pt reports low BP is normal HR 117 Resp 16 CBG 122

## 2018-01-18 NOTE — ED Notes (Signed)
Bed: WA19 Expected date:  Expected time:  Means of arrival:  Comments: EMS 

## 2018-01-18 NOTE — ED Notes (Signed)
Pt on cardiac monitoring upon arrival.

## 2018-01-19 ENCOUNTER — Emergency Department (HOSPITAL_COMMUNITY): Payer: Medicare Other

## 2018-01-19 ENCOUNTER — Inpatient Hospital Stay (HOSPITAL_COMMUNITY): Payer: Medicare Other

## 2018-01-19 ENCOUNTER — Encounter (HOSPITAL_COMMUNITY): Payer: Self-pay | Admitting: Internal Medicine

## 2018-01-19 DIAGNOSIS — L89219 Pressure ulcer of right hip, unspecified stage: Secondary | ICD-10-CM | POA: Diagnosis present

## 2018-01-19 DIAGNOSIS — A419 Sepsis, unspecified organism: Secondary | ICD-10-CM | POA: Diagnosis present

## 2018-01-19 DIAGNOSIS — L89154 Pressure ulcer of sacral region, stage 4: Secondary | ICD-10-CM | POA: Diagnosis present

## 2018-01-19 DIAGNOSIS — D509 Iron deficiency anemia, unspecified: Secondary | ICD-10-CM

## 2018-01-19 DIAGNOSIS — L8990 Pressure ulcer of unspecified site, unspecified stage: Secondary | ICD-10-CM | POA: Diagnosis not present

## 2018-01-19 DIAGNOSIS — T148XXA Other injury of unspecified body region, initial encounter: Secondary | ICD-10-CM

## 2018-01-19 DIAGNOSIS — Z79899 Other long term (current) drug therapy: Secondary | ICD-10-CM | POA: Diagnosis not present

## 2018-01-19 DIAGNOSIS — S14105S Unspecified injury at C5 level of cervical spinal cord, sequela: Secondary | ICD-10-CM | POA: Diagnosis not present

## 2018-01-19 DIAGNOSIS — Z981 Arthrodesis status: Secondary | ICD-10-CM | POA: Diagnosis not present

## 2018-01-19 DIAGNOSIS — J45909 Unspecified asthma, uncomplicated: Secondary | ICD-10-CM | POA: Diagnosis present

## 2018-01-19 DIAGNOSIS — G8250 Quadriplegia, unspecified: Secondary | ICD-10-CM | POA: Diagnosis present

## 2018-01-19 DIAGNOSIS — Z993 Dependence on wheelchair: Secondary | ICD-10-CM | POA: Diagnosis not present

## 2018-01-19 DIAGNOSIS — Z888 Allergy status to other drugs, medicaments and biological substances status: Secondary | ICD-10-CM | POA: Diagnosis not present

## 2018-01-19 DIAGNOSIS — L089 Local infection of the skin and subcutaneous tissue, unspecified: Secondary | ICD-10-CM | POA: Diagnosis not present

## 2018-01-19 DIAGNOSIS — L8944 Pressure ulcer of contiguous site of back, buttock and hip, stage 4: Secondary | ICD-10-CM

## 2018-01-19 DIAGNOSIS — D62 Acute posthemorrhagic anemia: Secondary | ICD-10-CM | POA: Diagnosis present

## 2018-01-19 DIAGNOSIS — M868X5 Other osteomyelitis, thigh: Secondary | ICD-10-CM | POA: Diagnosis present

## 2018-01-19 DIAGNOSIS — N390 Urinary tract infection, site not specified: Secondary | ICD-10-CM | POA: Diagnosis present

## 2018-01-19 DIAGNOSIS — L89319 Pressure ulcer of right buttock, unspecified stage: Secondary | ICD-10-CM | POA: Diagnosis present

## 2018-01-19 DIAGNOSIS — Z885 Allergy status to narcotic agent status: Secondary | ICD-10-CM | POA: Diagnosis not present

## 2018-01-19 DIAGNOSIS — L89229 Pressure ulcer of left hip, unspecified stage: Secondary | ICD-10-CM | POA: Diagnosis present

## 2018-01-19 DIAGNOSIS — L89329 Pressure ulcer of left buttock, unspecified stage: Secondary | ICD-10-CM | POA: Diagnosis present

## 2018-01-19 HISTORY — DX: Sepsis, unspecified organism: A41.9

## 2018-01-19 HISTORY — DX: Other injury of unspecified body region, initial encounter: T14.8XXA

## 2018-01-19 HISTORY — DX: Iron deficiency anemia, unspecified: D50.9

## 2018-01-19 HISTORY — DX: Local infection of the skin and subcutaneous tissue, unspecified: L08.9

## 2018-01-19 LAB — URINALYSIS, ROUTINE W REFLEX MICROSCOPIC
Bilirubin Urine: NEGATIVE
GLUCOSE, UA: NEGATIVE mg/dL
HGB URINE DIPSTICK: NEGATIVE
Ketones, ur: NEGATIVE mg/dL
NITRITE: NEGATIVE
PH: 6 (ref 5.0–8.0)
Protein, ur: 100 mg/dL — AB
SPECIFIC GRAVITY, URINE: 1.016 (ref 1.005–1.030)

## 2018-01-19 LAB — FERRITIN: FERRITIN: 37 ng/mL (ref 11–307)

## 2018-01-19 LAB — RETICULOCYTES
RBC.: 3.83 MIL/uL — AB (ref 3.87–5.11)
RETIC COUNT ABSOLUTE: 68.9 10*3/uL (ref 19.0–186.0)
Retic Ct Pct: 1.8 % (ref 0.4–3.1)

## 2018-01-19 LAB — INFLUENZA PANEL BY PCR (TYPE A & B)
INFLAPCR: NEGATIVE
INFLBPCR: NEGATIVE

## 2018-01-19 LAB — VITAMIN B12: VITAMIN B 12: 375 pg/mL (ref 180–914)

## 2018-01-19 LAB — BASIC METABOLIC PANEL
Anion gap: 8 (ref 5–15)
BUN: 11 mg/dL (ref 6–20)
CO2: 25 mmol/L (ref 22–32)
CREATININE: 0.47 mg/dL (ref 0.44–1.00)
Calcium: 7.9 mg/dL — ABNORMAL LOW (ref 8.9–10.3)
Chloride: 104 mmol/L (ref 101–111)
GFR calc Af Amer: >60 mL/min (ref >60)
Glucose, Bld: 93 mg/dL (ref 65–99)
POTASSIUM: 3.2 mmol/L — AB (ref 3.5–5.1)
SODIUM: 137 mmol/L (ref 135–145)

## 2018-01-19 LAB — IRON AND TIBC
Iron: 9 ug/dL — ABNORMAL LOW (ref 28–170)
Saturation Ratios: 5 % — ABNORMAL LOW (ref 10.4–31.8)
TIBC: 181 ug/dL — AB (ref 250–450)
UIBC: 172 ug/dL

## 2018-01-19 LAB — CBC
HCT: 26.8 % — ABNORMAL LOW (ref 36.0–46.0)
Hemoglobin: 8 g/dL — ABNORMAL LOW (ref 12.0–15.0)
MCH: 20.1 pg — ABNORMAL LOW (ref 26.0–34.0)
MCHC: 29.9 g/dL — ABNORMAL LOW (ref 30.0–36.0)
MCV: 67.3 fL — ABNORMAL LOW (ref 78.0–100.0)
PLATELETS: 195 10*3/uL (ref 150–400)
RBC: 3.98 MIL/uL (ref 3.87–5.11)
RDW: 20.9 % — AB (ref 11.5–15.5)
WBC: 5.4 10*3/uL (ref 4.0–10.5)

## 2018-01-19 LAB — ABO/RH: ABO/RH(D): A POS

## 2018-01-19 LAB — PREGNANCY, URINE: Preg Test, Ur: NEGATIVE

## 2018-01-19 LAB — HIV ANTIBODY (ROUTINE TESTING W REFLEX): HIV Screen 4th Generation wRfx: NONREACTIVE

## 2018-01-19 LAB — FOLATE: Folate: 15.1 ng/mL (ref >5.9)

## 2018-01-19 LAB — PROCALCITONIN: Procalcitonin: 0.47 ng/mL

## 2018-01-19 MED ORDER — ONDANSETRON HCL 4 MG/2ML IJ SOLN
4.0000 mg | Freq: Once | INTRAMUSCULAR | Status: AC
  Administered 2018-01-19: 4 mg via INTRAVENOUS
  Filled 2018-01-19: qty 2

## 2018-01-19 MED ORDER — HEPARIN SODIUM (PORCINE) 5000 UNIT/ML IJ SOLN
5000.0000 [IU] | Freq: Three times a day (TID) | INTRAMUSCULAR | Status: DC
  Administered 2018-01-19: 5000 [IU] via SUBCUTANEOUS
  Filled 2018-01-19 (×3): qty 1

## 2018-01-19 MED ORDER — VANCOMYCIN HCL 10 G IV SOLR: 1250.0000 mg | INTRAVENOUS | Status: DC

## 2018-01-19 MED ORDER — DOXYCYCLINE HYCLATE 100 MG IV SOLR
100.0000 mg | Freq: Two times a day (BID) | INTRAVENOUS | Status: DC
  Administered 2018-01-19: 100 mg via INTRAVENOUS
  Filled 2018-01-19: qty 100

## 2018-01-19 MED ORDER — PREGABALIN 75 MG PO CAPS
75.0000 mg | ORAL_CAPSULE | Freq: Three times a day (TID) | ORAL | Status: DC
  Administered 2018-01-19 – 2018-01-22 (×11): 75 mg via ORAL
  Filled 2018-01-19 (×11): qty 1

## 2018-01-19 MED ORDER — DEXTROSE 5 % IV SOLN
1.0000 g | INTRAVENOUS | Status: DC
  Administered 2018-01-19 – 2018-01-22 (×3): 1 g via INTRAVENOUS
  Filled 2018-01-19 (×4): qty 10

## 2018-01-19 MED ORDER — DARIFENACIN HYDROBROMIDE ER 7.5 MG PO TB24
7.5000 mg | ORAL_TABLET | Freq: Every day | ORAL | Status: DC
Start: 2018-01-19 — End: 2018-01-22
  Administered 2018-01-19 – 2018-01-22 (×4): 7.5 mg via ORAL
  Filled 2018-01-19 (×4): qty 1

## 2018-01-19 MED ORDER — SODIUM CHLORIDE 0.9 % IV SOLN
INTRAVENOUS | Status: AC
  Administered 2018-01-19: 04:00:00 via INTRAVENOUS

## 2018-01-19 MED ORDER — ONDANSETRON HCL 4 MG/2ML IJ SOLN
4.0000 mg | Freq: Four times a day (QID) | INTRAMUSCULAR | Status: DC | PRN
  Administered 2018-01-19 – 2018-01-22 (×5): 4 mg via INTRAVENOUS
  Filled 2018-01-19 (×5): qty 2

## 2018-01-19 MED ORDER — CEFAZOLIN SODIUM-DEXTROSE 1-4 GM/50ML-% IV SOLN
1.0000 g | Freq: Three times a day (TID) | INTRAVENOUS | Status: DC
  Administered 2018-01-19: 1 g via INTRAVENOUS
  Filled 2018-01-19 (×2): qty 50

## 2018-01-19 MED ORDER — PANTOPRAZOLE SODIUM 20 MG PO TBEC
20.0000 mg | DELAYED_RELEASE_TABLET | Freq: Every day | ORAL | Status: DC
  Administered 2018-01-19 – 2018-01-22 (×4): 20 mg via ORAL
  Filled 2018-01-19 (×4): qty 1

## 2018-01-19 MED ORDER — GADOBENATE DIMEGLUMINE 529 MG/ML IV SOLN
10.0000 mL | Freq: Once | INTRAVENOUS | Status: AC | PRN
  Administered 2018-01-19: 7 mL via INTRAVENOUS

## 2018-01-19 MED ORDER — HYDROCORTISONE NA SUCCINATE PF 100 MG IJ SOLR
25.0000 mg | Freq: Once | INTRAMUSCULAR | Status: AC
  Administered 2018-01-19: 25 mg via INTRAVENOUS
  Filled 2018-01-19: qty 2

## 2018-01-19 MED ORDER — ONDANSETRON HCL 4 MG PO TABS: 4.0000 mg | ORAL_TABLET | Freq: Four times a day (QID) | ORAL | Status: DC | PRN

## 2018-01-19 MED ORDER — HYDROMORPHONE HCL 2 MG PO TABS
4.0000 mg | ORAL_TABLET | Freq: Four times a day (QID) | ORAL | Status: DC
  Administered 2018-01-19 – 2018-01-22 (×15): 4 mg via ORAL
  Filled 2018-01-19 (×15): qty 2

## 2018-01-19 MED ORDER — ACETAMINOPHEN 650 MG RE SUPP: 650.0000 mg | Freq: Four times a day (QID) | RECTAL | Status: DC | PRN

## 2018-01-19 MED ORDER — PIPERACILLIN-TAZOBACTAM 3.375 G IVPB: 3.3750 g | Freq: Three times a day (TID) | INTRAVENOUS | Status: DC

## 2018-01-19 MED ORDER — FENTANYL CITRATE (PF) 100 MCG/2ML IJ SOLN
50.0000 ug | Freq: Once | INTRAMUSCULAR | Status: AC
  Administered 2018-01-19: 50 ug via INTRAVENOUS
  Filled 2018-01-19: qty 2

## 2018-01-19 MED ORDER — CLINDAMYCIN PHOSPHATE 600 MG/50ML IV SOLN
600.0000 mg | Freq: Three times a day (TID) | INTRAVENOUS | Status: DC
  Administered 2018-01-19 – 2018-01-21 (×6): 600 mg via INTRAVENOUS
  Filled 2018-01-19 (×8): qty 50

## 2018-01-19 MED ORDER — ACETAMINOPHEN 325 MG PO TABS
650.0000 mg | ORAL_TABLET | Freq: Four times a day (QID) | ORAL | Status: DC | PRN
  Administered 2018-01-21: 650 mg via ORAL
  Filled 2018-01-19 (×2): qty 2

## 2018-01-19 MED ORDER — DIAZEPAM 5 MG PO TABS
10.0000 mg | ORAL_TABLET | Freq: Two times a day (BID) | ORAL | Status: DC
  Administered 2018-01-19 – 2018-01-22 (×8): 10 mg via ORAL
  Filled 2018-01-19 (×8): qty 2

## 2018-01-19 NOTE — Progress Notes (Signed)
A consult was received from an ED physician for Vancomycin and Zosynper pharmacy dosing.  The patient's profile has been reviewed for ht/wt/allergies/indication/available labs.   A one time order has been placed for Vancomycin 1gm iv x1, and Zosyn 3.375gm iv x1.  Further antibiotics/pharmacy consults should be ordered by admitting physician if indicated.                       Thank you, Aleene DavidsonGrimsley Jr, Julitza Rickles Crowford 01/19/2018  2:08 AM

## 2018-01-19 NOTE — Progress Notes (Signed)
Patient seen and examined this morning, admitted overnight by Dr. Gilford SilviusKakrakandi, H&P reviewed and agree with the assessment and plan.  In brief, 32 year old female with history of quadriparesis status post cervical injury, scoliosis surgery, chronic anemia, mostly bedbound was brought to the hospital septic with fever and confusion, she was started on broad-spectrum antibiotics.    Sepsis presumed for infected sacral decubitus ulcers -Obtain an MRI of the sacral area as well as right hip were wounds appear to be deep and there is drainage -Patient did develop a reaction to vancomycin or Zosyn, not clear, placed on clindamycin and ceftriaxone for broad coverage for now -Blood cultures are pending  Microcytic hypochromic anemia with worsening of baseline hemoglobin  -we will check anemia panel follow CBC.  History of quadriparesis from cervical trauma and history of scoliosis surgery.  - Continue present medications for pain and muscle spasm.   Mild nausea  -abdomen is mildly distended.  Patient states when she is about to get her menstrual cycle her abdomen gets distended.  If nausea persist may need to get further radiological imaging.   Kaitlyn Skowron M. Elvera LennoxGherghe, MD [facility] [phone]  If 7PM-7AM, please contact night-coverage [email or web address] Password [email or web address]

## 2018-01-19 NOTE — Progress Notes (Addendum)
Pharmacy Antibiotic Note  [name] is a 32 y.o. female admitted on 01/18/2018 with sepsis.  Pharmacy has been consulted for Vancomycin and Zosyn dosing.  Plan: Zosyn 3.375g IV q8h (4 hour infusion).  Vancomycin 1gm iv x1, then 1250mg  iv q24hr     Temp (24hrs), Avg:100.9 F (38.3 C), Min:99.4 F (37.4 C), Max:102.3 F (39.1 C)  Recent Labs  Lab 01/18/18 2227 01/18/18 2243  WBC 7.1  --   CREATININE 0.47  --   LATICACIDVEN  --  1.05    Estimated Creatinine Clearance: 65.6 mL/min (by C-G formula based on SCr of 0.47 mg/dL).    Allergies  Allergen Reactions  . Metoclopramide Hcl Other (See Comments)    seizures  . Benadryl [Diphenhydramine Hcl] Rash  . Gabapentin Swelling  . Morphine Rash    Antimicrobials this admission: Vancomycin 01/18/2018 >> Zosyn 01/18/2018 >>  Dose adjustments this admission: -  Microbiology results: pending  Thank you for allowing pharmacy to be a part of this patient's care.  [name], [name] 01/19/2018 4:11 AM

## 2018-01-19 NOTE — ED Notes (Signed)
Admitting MD aware of pt blood pressure

## 2018-01-19 NOTE — Consult Note (Signed)
WOC Nurse wound consult note Reason for Consult:Chroinic, nonhealing full thickness ulcerations. 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 Is using [facility], am not sure who follows in the community. Patient reports both boosting and repositioning while she takes her on-line computer based college courses and lying on the "couch" with no pressure redistribution.  She does not have a pressure redistribution surface in bed at home (she states she cannot have this with her scoliosis). Seen with Dr. Wendy Poet. Gherghe this morning. Wound type:Pressure Pressure Injury POA: Yes Measurement: Sacral:  With irregularis (elevations) in the immediate periwound area. Scar tissue from spinal deformity surgeries.  Central wound with discoloration from silver vs eschar, 2cm x 3.5cm x 2cm with stained wound bed, small to moderate exudate. Right ischial tuberosity wound: 6cm x 8.5cm wound that has a sickle-shaped opening in the center with 3.5cm depth. Bone felt with cotton tipped applicator at base. Patient reports increasing drainage from this wound. Red base with non granulating tissue.   Left IT: 4cm x 6cm x 1.5cm red, moist wound with closed wound edge from 4-6 o'clock (epibole).  Non grandulating wound bed. Scar tissue surrounding from previous wound healing. Left superior buttocks:  2cm x 2.5cm x 0.1cmn partial thickness wound that is red, non-granulating. Minimal exudate. Left Hip:  Partial thickness 3cm x 2cm x 0.1cxm with small amount of serous exudate. Left lateral LE:  4cm x 0.5cm x 0.2cm will red/brown eschar/dried serum and no exucate. Wound bed:AS described above Drainage (amount, consistency, odor) As described above Periwound: As described above.  Feet are intact, but dry and edematous. Dressing procedure/placement/frequency: Patient refuses therapeutic mattress with low air loss feature while in house.  She is also resistant to heel pressure pressure redistribution boots.  We  will turn and reposition and float heels, keeping HOB at or below a 30 degree angle, but if wound healing is the goal, she will require a more aggressive POC.  Suggest consultation with Plastic Surgery (Dr. Ulice [place]ham or [place]) after imaging to rule out the presence of osteomyelitis. A dietary consult is recommended. In the meantime, we will use silver hydrofiber to the full thickness ulcerations and xeroform gauze to the partial thickness wounds.  It should be noted that all wounds are non healing/non granulating.  WOC nursing team will not follow, but will remain available to this patient, the nursing and medical teams.  Please re-consult if needed. Thanks, Ladona MowLaurie Emanuelle Hammerstrom, MSN, RN, GNP, Hans EdenCWOCN, CWON-AP, FAAN  Pager# [phone]

## 2018-01-19 NOTE — H&P (Signed)
History and Physical    [name] UEA:[id number] DOB: Jul 27, 1986 DOA: 01/18/2018  PCP: System, Pcp Not In  Patient coming from: Home.  Chief Complaint: Fever and confusion.  HPI: [name] is a 32 y.o. female with history of quadriparesis status post cervical injury and scoliosis surgery and chronic anemia was brought to the ER after patient was found to have fever and confusion.  As per the patient, patient was found to be confused by patient's friend over the last 24 hours.  Patient also had subjective feeling of fever chills.  Had some nausea with abdominal distention.  Denies any diarrhea had last bowel movement yesterday.  Since patient was having persistent symptoms patient came to the ER.  ED Course: In the ER patient was mildly febrile.  Chest x-ray was unremarkable.  UA did not show any different source of infection.  Influenza PCR was negative.  Patient has malodorous discharge from the sacral decubitus ulcer and was started on empiric antibiotics.  And admitted for possible developing sepsis from infected sacral diabetes also.  Review of Systems: As per HPI, rest all negative.   Past Medical History:  Diagnosis Date  . Abnormal Pap smear   . Anemia   . ASCUS (atypical squamous cells of undetermined significance) on Pap smear 10/2011  . Asthma Mild, only with exposure to cigarette smoke  . Autonomic dysreflexia   . Chronic pain 05-01-2004  . Congenital absence of right kidney   . Decubitus ulcer of buttock, stage 2    bilateral-nondraining-redressed q other day  . Decubitus ulcer of coccygeal region, stage 1   . H/O scoliosis   . Horseshoe kidney   . Hx: UTI (urinary tract infection) 11/27/11  . Recurrent nephrolithiasis    s/p stent  . Recurrent UTI requires self catheterization  . Sciatica   . Spinal cord injury, cervical region (HCC) 05/19/2004   movement,sensation intact-unable to stand/ transfer since May 01, 2016 after septic shock episode per pt  . Urinary  tract infection 03/2017    Past Surgical History:  Procedure Laterality Date  . BACK SURGERY  Scoliosis, Harrington Rods in place  . Bilateral leg surgery    . C3-6 spine surgery with rods    . dvt filter placement  May 01, 2004  . ESOPHAGOGASTRODUODENOSCOPY (EGD) WITH PROPOFOL N/A 01/07/2018   Procedure: ESOPHAGOGASTRODUODENOSCOPY (EGD) WITH PROPOFOL;  Surgeon: Kathi Der, MD;  Location: [facility];  Service: Gastroenterology;  Laterality: N/A;  . INSERTION OF SUPRAPUBIC CATHETER N/A 05/15/2017   Procedure: INSERTION OF SUPRAPUBIC CATHETER;  Surgeon: Ihor Gully, MD;  Location: [facility];  Service: Urology;  Laterality: N/A;  . LITHOTRIPSY    . ORTHOPEDIC SURGERY  Multiple lower extremity surgeries as a child  . VASCULAR SURGERY  IVC filter placed in May 01, 2004     reports that  has never smoked. she has never used smokeless tobacco. She reports that she does not drink alcohol or use drugs.  Allergies  Allergen Reactions  . Metoclopramide Hcl Other (See Comments)    seizures  . Benadryl [Diphenhydramine Hcl] Rash  . Gabapentin Swelling  . Morphine Rash    Family History  Problem Relation Age of Onset  . Heart disease Mother        Massive MI in May 01, 2005 ; died    Prior to Admission medications   Medication Sig Start Date End Date Taking? Authorizing Provider  albuterol (PROVENTIL HFA;VENTOLIN HFA) 108 (90 BASE) MCG/ACT inhaler Inhale 2 puffs into the lungs every  4 (four) hours as needed for wheezing or shortness of breath.    Yes [provider]  diazepam (VALIUM) 5 MG tablet Take 10 mg by mouth every 12 (twelve) hours. 12/03/16  Yes [provider]  HYDROmorphone (DILAUDID) 4 MG tablet Take 4 mg by mouth every 6 (six) hours.    Yes [provider]  ibuprofen (ADVIL,MOTRIN) 200 MG tablet Take 200 mg by mouth daily as needed for moderate pain.   Yes [provider]  pantoprazole (PROTONIX) 20 MG tablet Take 20 mg by mouth daily  after breakfast.    Yes [provider]  pregabalin (LYRICA) 75 MG capsule Take 75 mg by mouth 3 (three) times daily.   Yes [provider]  solifenacin (VESICARE) 10 MG tablet Take 10 mg by mouth every morning.    Yes [provider]  benzonatate (TESSALON) 100 MG capsule Take 1 capsule (100 mg total) by mouth every 8 (eight) hours. Patient not taking: Reported on 12/30/2017 08/22/17   Dietrich Pates, PA-C  ondansetron (ZOFRAN ODT) 4 MG disintegrating tablet Take 1 tablet (4 mg total) by mouth every 8 (eight) hours as needed for nausea or vomiting. Patient not taking: Reported on 12/30/2017 08/22/17   Dietrich Pates, PA-C  promethazine (PHENERGAN) 25 MG tablet Take 1 tablet (25 mg total) by mouth every 8 (eight) hours as needed for nausea or vomiting. Patient not taking: Reported on 12/30/2017 09/16/17   Fayrene Helper, PA-C  norethindrone (MICRONOR,CAMILA,ERRIN) 0.35 MG tablet Take 1 tablet by mouth daily.  03/10/12  [provider]    Physical Exam: Vitals:   01/19/18 0015 01/19/18 0045 01/19/18 0100 01/19/18 0151  BP: (!) 116/42 (!) 94/57 (!) 104/53 (!) 93/55  Pulse: (!) 132 (!) 105  88  Resp: 19 18 13 16   Temp:      TempSrc:      SpO2: 98% 90%  99%      Constitutional: Moderately built and nourished. Vitals:   01/19/18 0015 01/19/18 0045 01/19/18 0100 01/19/18 0151  BP: (!) 116/42 (!) 94/57 (!) 104/53 (!) 93/55  Pulse: (!) 132 (!) 105  88  Resp: 19 18 13 16   Temp:      TempSrc:      SpO2: 98% 90%  99%   Eyes: Anicteric no pallor. ENMT: No discharge from the ears eyes nose or mouth. Neck: No mass felt.  No neck rigidity. Respiratory: No rhonchi or crepitations. Cardiovascular: S1-S2 heard no murmurs appreciated. Abdomen: Mildly distended nontender.  Suprapubic catheter present. Musculoskeletal: Bilateral feet swelling seen.  Patient states is chronic. Skin: Sacral and buttock decubitus ulcers which is stage IV.  With copious discharge which are  foul-smelling this was the description given by patient's nurse.  Unable to see the skin at this time due to positioning. Neurologic: Alert awake oriented to time place and person moves upper extremities but unable to move lower extremities. Psychiatric: Appears normal.  Normal affect.   Labs on Admission: I have personally reviewed following labs and imaging studies  CBC: Recent Labs  Lab 01/18/18 2227  WBC 7.1  NEUTROABS 6.0  HGB 7.7*  HCT 26.2*  MCV 66.0*  PLT 185   Basic Metabolic Panel: Recent Labs  Lab 01/18/18 2227  NA 134*  K 3.4*  CL 100*  CO2 27  GLUCOSE 109*  BUN 13  CREATININE 0.47  CALCIUM 8.1*   GFR: Estimated Creatinine Clearance: 65.6 mL/min (by C-G formula based on SCr of 0.47 mg/dL). Liver Function Tests:  Recent Labs  Lab 01/18/18 2227  AST 21  ALT 13*  ALKPHOS 60  BILITOT 0.4  PROT 7.3  ALBUMIN 2.6*   No results for input(s): LIPASE, AMYLASE in the last 168 hours. No results for input(s): AMMONIA in the last 168 hours. Coagulation Profile: No results for input(s): INR, PROTIME in the last 168 hours. Cardiac Enzymes: No results for input(s): CKTOTAL, CKMB, CKMBINDEX, TROPONINI in the last 168 hours. BNP (last 3 results) No results for input(s): PROBNP in the last 8760 hours. HbA1C: No results for input(s): HGBA1C in the last 72 hours. CBG: No results for input(s): GLUCAP in the last 168 hours. Lipid Profile: No results for input(s): CHOL, HDL, LDLCALC, TRIG, CHOLHDL, LDLDIRECT in the last 72 hours. Thyroid Function Tests: No results for input(s): TSH, T4TOTAL, FREET4, T3FREE, THYROIDAB in the last 72 hours. Anemia Panel: Recent Labs    01/18/18 2227  RETICCTPCT 1.8   Urine analysis:    Component Value Date/Time   COLORURINE AMBER (A) 01/18/2018 2202   APPEARANCEUR CLOUDY (A) 01/18/2018 2202   LABSPEC 1.016 01/18/2018 2202   PHURINE 6.0 01/18/2018 2202   GLUCOSEU NEGATIVE 01/18/2018 2202   HGBUR NEGATIVE 01/18/2018 2202    BILIRUBINUR NEGATIVE 01/18/2018 2202   KETONESUR NEGATIVE 01/18/2018 2202   PROTEINUR 100 (A) 01/18/2018 2202   UROBILINOGEN 0.2 08/03/2015 1714   NITRITE NEGATIVE 01/18/2018 2202   LEUKOCYTESUR LARGE (A) 01/18/2018 2202   Sepsis Labs: @LABRCNTIP (procalcitonin:4,lacticidven:4) )No results found for this or any previous visit (from the past 240 hour(s)).   Radiological Exams on Admission: Dg Chest Portable 1 View  Result Date: 01/19/2018 CLINICAL DATA:  Fever and chills for 72 hours. EXAM: PORTABLE CHEST 1 VIEW COMPARISON:  01/07/2018 FINDINGS: Thoracic scoliosis with posterior rod and screw fixation. Postoperative changes in the cervical spine. Shallow inspiration. Heart size and pulmonary vascularity are normal. Lungs are clear and expanded. No blunting of costophrenic angles. No pneumothorax. IMPRESSION: No active disease. Electronically Signed   By: Burman NievesWilliam  Stevens M.D.   On: 01/19/2018 00:24     Assessment/Plan Principal Problem:   Sepsis (HCC) Active Problems:   Wound infection   Microcytic hypochromic anemia    1. Possible developing sepsis from infected sacral and buttock decubitus ulcer -patient was initially placed on Vanco Zosyn following which patient developed generalized rash.  It is not sure which one patient was allergic to.  I have written 1 dose of hydrocortisone 25 mg IV since patient is allergic to Benadryl also.  After discussing with pharmacy patient has been placed on Ancef since patient has tolerated Ancef previously and also on doxycycline.  I have requested wound team consult.  Follow cultures. 2. Microcytic hypochromic anemia with worsening of baseline hemoglobin -we will check anemia panel follow CBC. 3. History of quadriparesis from cervical trauma and history of scoliosis surgery.  Continue present medications for pain and muscle spasm.  4. Mild nausea -abdomen is mildly distended.  Patient states when she is about to get her menstrual cycle her abdomen gets  distended.  If nausea persist may need to get further radiological imaging.   DVT prophylaxis: SCDs for now until anemia does not worsen. Code Status: Full code. Family Communication: Discussed with patient. Disposition Plan: To be determined. Consults called: Wound team. Admission status: Inpatient.   Eduard ClosArshad N Victorino Fatzinger MD [facility] Pager [phone].  If 7PM-7AM, please contact night-coverage [email or web address] Password [email or web address]  01/19/2018, 2:32 AM

## 2018-01-19 NOTE — ED Notes (Signed)
ED TO INPATIENT HANDOFF REPORT  Name/Age/Gender [name] 32 y.o. female  Code Status    Code Status Orders  (From admission, onward)        Start     Ordered   01/19/18 0225  Full code  Continuous     01/19/18 0228    Code Status History    Date Active Date Inactive Code Status Order ID Comments User Context   This patient has a current code status but no historical code status.      Home/SNF/Other Home  Chief Complaint Fever; UTI  Level of Care/Admitting Diagnosis ED Disposition    ED Disposition Condition Comment   Admit  Hospital Area: [facility] [[id number]]  Level of Care: Telemetry [5]  Admit to tele based on following criteria: Monitor for Ischemic changes  Diagnosis: Wound infection [154008]  Admitting Physician: Rise Patience [phone]  Attending Physician: Rise Patience [phone]  Estimated length of stay: past midnight tomorrow  Certification:: I certify this patient will need inpatient services for at least 2 midnights  PT Class (Do Not Modify): Inpatient [101]  PT Acc Code (Do Not Modify): Private [1]       Medical History Past Medical History:  Diagnosis Date  . Abnormal Pap smear   . Anemia   . ASCUS (atypical squamous cells of undetermined significance) on Pap smear 10/2011  . Asthma Mild, only with exposure to cigarette smoke  . Autonomic dysreflexia   . Chronic pain 2005  . Congenital absence of right kidney   . Decubitus ulcer of buttock, stage 2    bilateral-nondraining-redressed q other day  . Decubitus ulcer of coccygeal region, stage 1   . H/O scoliosis   . Horseshoe kidney   . Hx: UTI (urinary tract infection) 11/27/11  . Recurrent nephrolithiasis    s/p stent  . Recurrent UTI requires self catheterization  . Sciatica   . Spinal cord injury, cervical region ([place]) 05/19/2004   movement,sensation intact-unable to stand/ transfer since 2017 after septic shock episode per pt  . Urinary tract  infection 03/2017    Allergies Allergies  Allergen Reactions  . Metoclopramide Hcl Other (See Comments)    seizures  . Benadryl [Diphenhydramine Hcl] Rash  . Gabapentin Swelling  . Morphine Rash    IV Location/Drains/Wounds Patient Lines/Drains/Airways Status   Active Line/Drains/Airways    Name:   Placement date:   Placement time:   Site:   Days:   Peripheral IV 01/18/18 Left Hand   01/18/18    2309    Hand   1   Urethral Catheter Breana Melvin Latex;Straight-tip 14 Fr.   12/17/16    1851    Latex;Straight-tip   398   Suprapubic Catheter Latex 16 Fr.   05/15/17    1109    Latex   249   Incision (Closed) 05/15/17 Perineum Other (Comment)   05/15/17    1123     249          Labs/Imaging Results for orders placed or performed during the hospital encounter of 01/18/18 (from the past 48 hour(s))  Urinalysis, Routine w reflex microscopic     Status: Abnormal   Collection Time: 01/18/18 10:02 PM  Result Value Ref Range   Color, Urine AMBER (A) YELLOW    Comment: BIOCHEMICALS MAY BE AFFECTED BY COLOR   APPearance CLOUDY (A) CLEAR   Specific Gravity, Urine 1.016 1.005 - 1.030   pH 6.0 5.0 - 8.0  Glucose, UA NEGATIVE NEGATIVE mg/dL   Hgb urine dipstick NEGATIVE NEGATIVE   Bilirubin Urine NEGATIVE NEGATIVE   Ketones, ur NEGATIVE NEGATIVE mg/dL   Protein, ur 100 (A) NEGATIVE mg/dL   Nitrite NEGATIVE NEGATIVE   Leukocytes, UA LARGE (A) NEGATIVE   RBC / HPF 6-30 0 - 5 RBC/hpf   WBC, UA TOO NUMEROUS TO COUNT 0 - 5 WBC/hpf   Bacteria, UA MANY (A) NONE SEEN   Squamous Epithelial / LPF 0-5 (A) NONE SEEN   WBC Clumps PRESENT    Mucus PRESENT    Amorphous Crystal PRESENT    Ca Oxalate Crys, UA PRESENT   Comprehensive metabolic panel     Status: Abnormal   Collection Time: 01/18/18 10:27 PM  Result Value Ref Range   Sodium 134 (L) 135 - 145 mmol/L   Potassium 3.4 (L) 3.5 - 5.1 mmol/L   Chloride 100 (L) 101 - 111 mmol/L   CO2 27 22 - 32 mmol/L   Glucose, Bld 109 (H) 65 - 99  mg/dL   BUN 13 6 - 20 mg/dL   Creatinine, Ser 0.47 0.44 - 1.00 mg/dL   Calcium 8.1 (L) 8.9 - 10.3 mg/dL   Total Protein 7.3 6.5 - 8.1 g/dL   Albumin 2.6 (L) 3.5 - 5.0 g/dL   AST 21 15 - 41 U/L   ALT 13 (L) 14 - 54 U/L   Alkaline Phosphatase 60 38 - 126 U/L   Total Bilirubin 0.4 0.3 - 1.2 mg/dL   GFR calc non Af Amer >60 >60 mL/min   GFR calc Af Amer >60 >60 mL/min    Comment: (NOTE) The eGFR has been calculated using the CKD EPI equation. This calculation has not been validated in all clinical situations. eGFR's persistently <60 mL/min signify possible Chronic Kidney Disease.    Anion gap 7 5 - 15  CBC with Differential     Status: Abnormal   Collection Time: 01/18/18 10:27 PM  Result Value Ref Range   WBC 7.1 4.0 - 10.5 K/uL   RBC 3.97 3.87 - 5.11 MIL/uL   Hemoglobin 7.7 (L) 12.0 - 15.0 g/dL   HCT 26.2 (L) 36.0 - 46.0 %   MCV 66.0 (L) 78.0 - 100.0 fL   MCH 19.4 (L) 26.0 - 34.0 pg   MCHC 29.4 (L) 30.0 - 36.0 g/dL   RDW 20.5 (H) 11.5 - 15.5 %   Platelets 185 150 - 400 K/uL   Neutrophils Relative % 84 %   Lymphocytes Relative 13 %   Monocytes Relative 3 %   Eosinophils Relative 0 %   Basophils Relative 0 %   Neutro Abs 6.0 1.7 - 7.7 K/uL   Lymphs Abs 0.9 0.7 - 4.0 K/uL   Monocytes Absolute 0.2 0.1 - 1.0 K/uL   Eosinophils Absolute 0.0 0.0 - 0.7 K/uL   Basophils Absolute 0.0 0.0 - 0.1 K/uL   RBC Morphology POLYCHROMASIA PRESENT     Comment: ELLIPTOCYTES   WBC Morphology WHITE COUNT CONFIRMED ON SMEAR    Smear Review PLATELET COUNT CONFIRMED BY SMEAR   Reticulocytes     Status: Abnormal   Collection Time: 01/18/18 10:27 PM  Result Value Ref Range   Retic Ct Pct 1.8 0.4 - 3.1 %   RBC. 3.83 (L) 3.87 - 5.11 MIL/uL   Retic Count, Absolute 68.9 19.0 - 186.0 K/uL  I-Stat beta hCG blood, ED     Status: Abnormal   Collection Time: 01/18/18 10:41 PM  Result Value Ref Range  I-stat hCG, quantitative 6.5 (H) <5 mIU/mL   Comment 3            Comment:   GEST. AGE      CONC.   (mIU/mL)   <=1 WEEK        5 - 50     2 WEEKS       50 - 500     3 WEEKS       100 - 10,000     4 WEEKS     1,000 - 30,000        FEMALE AND NON-PREGNANT FEMALE:     LESS THAN 5 mIU/mL   I-Stat CG4 Lactic Acid, ED     Status: None   Collection Time: 01/18/18 10:43 PM  Result Value Ref Range   Lactic Acid, Venous 1.05 0.5 - 1.9 mmol/L  Pregnancy, urine     Status: None   Collection Time: 01/19/18 12:19 AM  Result Value Ref Range   Preg Test, Ur NEGATIVE NEGATIVE    Comment:        THE SENSITIVITY OF THIS METHODOLOGY IS >20 mIU/mL.   Influenza panel by PCR (type A & B)     Status: None   Collection Time: 01/19/18 12:31 AM  Result Value Ref Range   Influenza A By PCR NEGATIVE NEGATIVE   Influenza B By PCR NEGATIVE NEGATIVE    Comment: (NOTE) The Xpert Xpress Flu assay is intended as an aid in the diagnosis of  influenza and should not be used as a sole basis for treatment.  This  assay is FDA approved for nasopharyngeal swab specimens only. Nasal  washings and aspirates are unacceptable for Xpert Xpress Flu testing.   Type and screen [place]     Status: None   Collection Time: 01/19/18 12:42 AM  Result Value Ref Range   ABO/RH(D) A POS    Antibody Screen NEG    Sample Expiration 01/22/2018    Dg Chest Portable 1 View  Result Date: 01/19/2018 CLINICAL DATA:  Fever and chills for 72 hours. EXAM: PORTABLE CHEST 1 VIEW COMPARISON:  01/07/2018 FINDINGS: Thoracic scoliosis with posterior rod and screw fixation. Postoperative changes in the cervical spine. Shallow inspiration. Heart size and pulmonary vascularity are normal. Lungs are clear and expanded. No blunting of costophrenic angles. No pneumothorax. IMPRESSION: No active disease. Electronically Signed   By: Lucienne Capers M.D.   On: 01/19/2018 00:24    Pending Labs Unresulted Labs (From admission, onward)   Start     Ordered   01/19/18 0500  HIV antibody (Routine Testing)  Tomorrow morning,   R      01/19/18 0228   01/19/18 3893  Basic metabolic panel  Tomorrow morning,   R     01/19/18 0228   01/19/18 0500  CBC  Tomorrow morning,   R     01/19/18 0228   01/19/18 0233  Procalcitonin - Baseline  STAT,   STAT     01/19/18 0232   01/19/18 0224  CBC  (heparin)  Once,   R    Comments:  Baseline for heparin therapy IF NOT ALREADY DRAWN.  Notify MD if PLT < 100 K.    01/19/18 0228   01/19/18 0224  Creatinine, serum  (heparin)  Once,   R    Comments:  Baseline for heparin therapy IF NOT ALREADY DRAWN.    01/19/18 0228   01/19/18 0041  ABO/Rh  Once,   R  01/19/18 0041   01/19/18 0018  Vitamin B12  (Anemia Panel (PNL))  Once,   R     01/19/18 0018   01/19/18 0018  Folate  (Anemia Panel (PNL))  Once,   R     01/19/18 0018   01/19/18 0018  Iron and TIBC  (Anemia Panel (PNL))  Once,   R     01/19/18 0018   01/19/18 0018  Ferritin  (Anemia Panel (PNL))  Once,   R     01/19/18 0018   01/18/18 2213  Blood Culture (routine x 2)  BLOOD CULTURE X 2,   STAT     01/18/18 2215   01/18/18 2209  Culture, blood (single)  Once,   R     01/18/18 2209      Vitals/Pain Today's Vitals   01/19/18 0130 01/19/18 0151 01/19/18 0200 01/19/18 0230  BP: (!) 93/55 (!) 93/55 (!) 90/50 (!) 89/53  Pulse:  88 88 84  Resp: 15 16 16 15   Temp:      TempSrc:      SpO2:  99% 99% 100%  PainSc:        Isolation Precautions No active isolations  Medications Medications  diazepam (VALIUM) tablet 10 mg (not administered)  HYDROmorphone (DILAUDID) tablet 4 mg (not administered)  pantoprazole (PROTONIX) EC tablet 20 mg (not administered)  pregabalin (LYRICA) capsule 75 mg (not administered)  darifenacin (ENABLEX) 24 hr tablet 7.5 mg (not administered)  acetaminophen (TYLENOL) tablet 650 mg (not administered)    Or  acetaminophen (TYLENOL) suppository 650 mg (not administered)  ondansetron (ZOFRAN) tablet 4 mg (not administered)    Or  ondansetron (ZOFRAN) injection 4 mg (not administered)  heparin  injection 5,000 Units (not administered)  0.9 %  sodium chloride infusion (not administered)  sodium chloride 0.9 % bolus 1,000 mL (0 mLs Intravenous Stopped 01/19/18 0130)    And  sodium chloride 0.9 % bolus 250 mL (250 mLs Intravenous Bolus from Bag 01/18/18 2318)  piperacillin-tazobactam (ZOSYN) IVPB 3.375 g (0 g Intravenous Stopped 01/18/18 2350)  vancomycin (VANCOCIN) IVPB 1000 mg/200 mL premix (0 mg Intravenous Stopped 01/19/18 0028)  fentaNYL (SUBLIMAZE) injection 50 mcg (50 mcg Intravenous Given 01/18/18 2317)  fentaNYL (SUBLIMAZE) injection 50 mcg (50 mcg Intravenous Given 01/19/18 0039)  ondansetron (ZOFRAN) injection 4 mg (4 mg Intravenous Given 01/19/18 0039)    Mobility non-ambulatory

## 2018-01-19 NOTE — Progress Notes (Signed)
Pt brought up from ED, noticed redness to pts neck/face/abdomen/legs. Pt also reported itching in the ED. Dr. Toniann FailKakrakandy aware and assessed patients. Allergies added to chart per verbal order. Medications d/c'd. New orders given. Will continue to monitor.

## 2018-01-20 DIAGNOSIS — L8990 Pressure ulcer of unspecified site, unspecified stage: Secondary | ICD-10-CM

## 2018-01-20 DIAGNOSIS — L089 Local infection of the skin and subcutaneous tissue, unspecified: Secondary | ICD-10-CM

## 2018-01-20 DIAGNOSIS — T148XXA Other injury of unspecified body region, initial encounter: Secondary | ICD-10-CM

## 2018-01-20 DIAGNOSIS — D509 Iron deficiency anemia, unspecified: Secondary | ICD-10-CM

## 2018-01-20 LAB — HEMOGLOBIN AND HEMATOCRIT, BLOOD
HEMATOCRIT: 28.2 % — AB (ref 36.0–46.0)
Hemoglobin: 8.5 g/dL — ABNORMAL LOW (ref 12.0–15.0)

## 2018-01-20 LAB — BASIC METABOLIC PANEL
Anion gap: 5 (ref 5–15)
BUN: 9 mg/dL (ref 6–20)
CALCIUM: 7.7 mg/dL — AB (ref 8.9–10.3)
CO2: 27 mmol/L (ref 22–32)
CREATININE: 0.45 mg/dL (ref 0.44–1.00)
Chloride: 103 mmol/L (ref 101–111)
Glucose, Bld: 96 mg/dL (ref 65–99)
Potassium: 3.3 mmol/L — ABNORMAL LOW (ref 3.5–5.1)
SODIUM: 135 mmol/L (ref 135–145)

## 2018-01-20 LAB — OCCULT BLOOD X 1 CARD TO LAB, STOOL: Fecal Occult Bld: NEGATIVE

## 2018-01-20 LAB — CBC
HCT: 21.1 % — ABNORMAL LOW (ref 36.0–46.0)
Hemoglobin: 6.2 g/dL — CL (ref 12.0–15.0)
MCH: 19.8 pg — AB (ref 26.0–34.0)
MCHC: 29.4 g/dL — AB (ref 30.0–36.0)
MCV: 67.4 fL — ABNORMAL LOW (ref 78.0–100.0)
PLATELETS: 196 10*3/uL (ref 150–400)
RBC: 3.13 MIL/uL — ABNORMAL LOW (ref 3.87–5.11)
RDW: 21.3 % — ABNORMAL HIGH (ref 11.5–15.5)
WBC: 5.8 10*3/uL (ref 4.0–10.5)

## 2018-01-20 LAB — PREPARE RBC (CROSSMATCH)

## 2018-01-20 MED ORDER — SODIUM CHLORIDE 0.9 % IV SOLN
INTRAVENOUS | Status: DC
  Administered 2018-01-20 – 2018-01-22 (×4): via INTRAVENOUS

## 2018-01-20 MED ORDER — SODIUM CHLORIDE 0.9 % IV SOLN
Freq: Once | INTRAVENOUS | Status: AC
  Administered 2018-01-20: 07:00:00 via INTRAVENOUS

## 2018-01-20 MED ORDER — POTASSIUM CHLORIDE CRYS ER 20 MEQ PO TBCR
40.0000 meq | EXTENDED_RELEASE_TABLET | Freq: Once | ORAL | Status: DC
  Filled 2018-01-20: qty 2

## 2018-01-20 NOTE — Consult Note (Addendum)
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  Constitutional: She is oriented to person, place, and time.  Chronically ill, bed bound quadriplegic   HENT:  Head: Normocephalic and atraumatic.  Eyes: Right eye exhibits no discharge. Left eye exhibits no discharge. No scleral icterus.  Pupils are equal  Neck: Normal range of motion. Neck supple. No JVD present. No tracheal deviation present. No thyromegaly present.  Cardiovascular: Normal heart sounds and intact distal pulses.  tachycardic  Respiratory: Effort normal and breath sounds normal. No respiratory distress. She has no wheezes. She has no rales. She exhibits no tenderness.  GI: Soft. Bowel sounds are normal. She exhibits distension. She exhibits no mass. There is no tenderness. There is no rebound and no guarding.  Indwelling foley and some belly  piercing's.  Musculoskeletal: She exhibits no edema or tenderness.  Scars on her back from prior surgeries  Lymphadenopathy:    She has no cervical adenopathy.  Neurological: She is alert and oriented to person, place, and time. No cranial nerve deficit.  Skin: Skin is warm and dry. No rash noted.  No erythema. No pallor.  Pictures below  Psychiatric: She has a normal mood and affect. Her behavior is normal. Judgment and thought content normal.   Sacrum, she note she fell recently and that is why it is bruised     This is the right hip and it does track some in the lower ulcer.  We will begin packing with some Iodoform.    Left hip She has some drainage noted by nursing who was in the middle of changing this.    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  Her fiance who is with her does the wound care at home and says they do this about every other day.  This could be increased.  She is not able to shower and was very surprised when I said she could shower if that were possible.  I do not see anything that needs surgical debridement. Everything is open and pretty clean.  All measure to help relieve pressure would help, increased nutrition would also be useful.  Antibiotics per Medicine.     JENNINGS,WILLARD 01/20/2018, 4:25 PM    Recommend evaluation with plastic surgery, no acute infectious needs, will sign off

## 2018-01-20 NOTE — Progress Notes (Signed)
CRITICAL VALUE ALERT  Critical Value:  hgb 6.2  Date & Time Notied:  0610 1/30  Provider Notified: Craige CottaKirby  Orders Received/Actions taken: 1 unit RBC to be given
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
## 2018-01-21 ENCOUNTER — Encounter (HOSPITAL_COMMUNITY): Payer: Self-pay | Admitting: Surgery

## 2018-01-21 ENCOUNTER — Inpatient Hospital Stay (HOSPITAL_COMMUNITY): Payer: Medicare Other

## 2018-01-21 DIAGNOSIS — N390 Urinary tract infection, site not specified: Secondary | ICD-10-CM | POA: Diagnosis present

## 2018-01-21 DIAGNOSIS — G8250 Quadriplegia, unspecified: Secondary | ICD-10-CM

## 2018-01-21 DIAGNOSIS — A419 Sepsis, unspecified organism: Principal | ICD-10-CM

## 2018-01-21 LAB — COMPREHENSIVE METABOLIC PANEL
ALBUMIN: 2.3 g/dL — AB (ref 3.5–5.0)
ALT: 10 U/L — ABNORMAL LOW (ref 14–54)
ANION GAP: 5 (ref 5–15)
AST: 16 U/L (ref 15–41)
Alkaline Phosphatase: 53 U/L (ref 38–126)
BUN: 7 mg/dL (ref 6–20)
CHLORIDE: 103 mmol/L (ref 101–111)
CO2: 27 mmol/L (ref 22–32)
Calcium: 8 mg/dL — ABNORMAL LOW (ref 8.9–10.3)
Creatinine, Ser: 0.39 mg/dL — ABNORMAL LOW (ref 0.44–1.00)
GFR calc Af Amer: >60 mL/min (ref >60)
Glucose, Bld: 102 mg/dL — ABNORMAL HIGH (ref 65–99)
POTASSIUM: 4.3 mmol/L (ref 3.5–5.1)
Sodium: 135 mmol/L (ref 135–145)
TOTAL PROTEIN: 6.6 g/dL (ref 6.5–8.1)
Total Bilirubin: 0.1 mg/dL — ABNORMAL LOW (ref 0.3–1.2)

## 2018-01-21 LAB — TYPE AND SCREEN
ABO/RH(D): A POS
ANTIBODY SCREEN: NEGATIVE
UNIT DIVISION: 0

## 2018-01-21 LAB — CBC
HEMATOCRIT: 27.3 % — AB (ref 36.0–46.0)
HEMOGLOBIN: 8.4 g/dL — AB (ref 12.0–15.0)
MCH: 21.2 pg — ABNORMAL LOW (ref 26.0–34.0)
MCHC: 30.8 g/dL (ref 30.0–36.0)
MCV: 68.8 fL — ABNORMAL LOW (ref 78.0–100.0)
Platelets: 230 10*3/uL (ref 150–400)
RBC: 3.97 MIL/uL (ref 3.87–5.11)
RDW: 21.7 % — AB (ref 11.5–15.5)
WBC: 4.6 10*3/uL (ref 4.0–10.5)

## 2018-01-21 LAB — BPAM RBC
Blood Product Expiration Date: 201902142359
ISSUE DATE / TIME: 201901301706
Unit Type and Rh: 6200

## 2018-01-21 MED ORDER — METRONIDAZOLE 500 MG PO TABS
500.0000 mg | ORAL_TABLET | Freq: Three times a day (TID) | ORAL | Status: DC
  Administered 2018-01-21 – 2018-01-22 (×3): 500 mg via ORAL
  Filled 2018-01-21 (×3): qty 1

## 2018-01-21 MED ORDER — LIDOCAINE HCL 1 % IJ SOLN
INTRAMUSCULAR | Status: AC
  Filled 2018-01-21: qty 20

## 2018-01-21 MED ORDER — SODIUM CHLORIDE 0.9% FLUSH: 10.0000 mL | INTRAVENOUS | Status: DC | PRN

## 2018-01-21 MED ORDER — LIDOCAINE HCL 1 % IJ SOLN
INTRAMUSCULAR | Status: AC | PRN
  Administered 2018-01-21: 5 mL

## 2018-01-21 MED ORDER — ENSURE ENLIVE PO LIQD
237.0000 mL | Freq: Two times a day (BID) | ORAL | Status: DC
  Administered 2018-01-21 – 2018-01-22 (×2): 237 mL via ORAL

## 2018-01-21 MED ORDER — HYDROMORPHONE HCL 1 MG/ML IJ SOLN
0.5000 mg | Freq: Once | INTRAMUSCULAR | Status: AC
  Administered 2018-01-21: 0.5 mg via INTRAVENOUS
  Filled 2018-01-21: qty 0.5
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
## 2018-01-21 NOTE — Progress Notes (Signed)
PROGRESS NOTE    [name]  ZOX:[id number] DOB: June 20, 1986 DOA: 01/18/2018 PCP: System, Pcp Not In    Brief Narrative:  32 year old female with history of quadriparesis status post cervical injury, scoliosis surgery, chronic anemia, mostly bedbound was brought to the hospital septic with fever and confusion, she was started on broad-spectrum antibiotics.  Assessment & Plan:   Principal Problem:   Sepsis (HCC) Active Problems:   Wound infection   Microcytic hypochromic anemia   Infected decubitus ulcer, unspecified pressure ulcer stage   Pressure injury of skin   Recurrent UTI   Spinal cord injury, cervical region [facility])   Quadriplegia following spinal cord injury (HCC)  Sepsis presumed for infected non-healing sacral decubitus ulcers with osteomyelitis of R greater trochanter -MRI reviewed. Findings of decub ulcers without drainable abscess. Osteomyelitis involving R greater trochanter noted -Appreciate input by WOC, noted.  -Patient did develop a reaction to vancomycin or Zosyn, not clear, placed on clindamycin and ceftriaxone for broad coverage -Blood cultures negative thus far -Discussed case with ID who recommends 4 weeks of rocephin IV with PO flagyl and close follow up with outpatient wound care.  Microcytic hypochromic anemia with worsening of baseline hemoglobin - improved with one unit PRBC per below  History of quadriparesis from cervical trauma and history of scoliosis surgery.  -Continue present medications for pain and muscle spasm.  -Stable at present  Mild nausea -abdomen is mildly distended. Patient states when she is about to get her menstrual cycle her abdomen gets distended.  - Stable currently  Acute blood loss anemia -Likely secondary to menstrual bleed -Patient reports monthly period is to begin in several days -Hgb low of 6.2 noted, improved to over 8 after one unit PRBC transfusion  Elevated HCG -Follow up pregnancy test reviewed,  negative -recommend repeat HCG in 1-2 weeks  DVT prophylaxis: SCD's Code Status: Full Family Communication: Pt in room, family not at bedside Disposition Plan: Uncertain at this time  Consultants:   WOC  Procedures:   PICC 01/21/18  Antimicrobials: Anti-infectives (From admission, onward)   Start     Dose/Rate Route Frequency Ordered Stop   01/19/18 2200  vancomycin (VANCOCIN) 1,250 mg in sodium chloride 0.9 % 250 mL IVPB  Status:  Discontinued     1,250 mg 166.7 mL/hr over 90 Minutes Intravenous Every 24 hours 01/19/18 0409 01/19/18 0535   01/19/18 1400  clindamycin (CLEOCIN) IVPB 600 mg     600 mg 100 mL/hr over 30 Minutes Intravenous Every 8 hours 01/19/18 1122     01/19/18 1200  cefTRIAXone (ROCEPHIN) 1 g in dextrose 5 % 50 mL IVPB     1 g 100 mL/hr over 30 Minutes Intravenous Every 24 hours 01/19/18 1122     01/19/18 0600  piperacillin-tazobactam (ZOSYN) IVPB 3.375 g  Status:  Discontinued     3.375 g 12.5 mL/hr over 240 Minutes Intravenous Every 8 hours 01/19/18 0410 01/19/18 0534   01/19/18 0600  ceFAZolin (ANCEF) IVPB 1 g/50 mL premix  Status:  Discontinued     1 g 100 mL/hr over 30 Minutes Intravenous Every 8 hours 01/19/18 0538 01/19/18 1122   01/19/18 0545  doxycycline (VIBRAMYCIN) 100 mg in dextrose 5 % 250 mL IVPB  Status:  Discontinued     100 mg 125 mL/hr over 120 Minutes Intravenous 2 times daily 01/19/18 0538 01/19/18 1123   01/18/18 2230  piperacillin-tazobactam (ZOSYN) IVPB 3.375 g     3.375 g 100 mL/hr over 30 Minutes Intravenous  Once 01/18/18 2215 01/18/18 2350   01/18/18 2230  vancomycin (VANCOCIN) IVPB 1000 mg/200 mL premix     1,000 mg 200 mL/hr over 60 Minutes Intravenous  Once 01/18/18 2215 01/19/18 0028      Subjective: Without complaints at this time  Objective: Vitals:   01/20/18 1730 01/20/18 2116 01/21/18 0601 01/21/18 1339  BP: 112/79 113/78 117/75 (!) 145/90  Pulse: 86 87 (!) 103 94  Resp: 16 16 16 18   Temp: 98 F (36.7 C) 98.5  F (36.9 C) 99.4 F (37.4 C) 98 F (36.7 C)  TempSrc:  Oral Oral Oral  SpO2: 100% 99% 99% 98%  Weight:      Height:        Intake/Output Summary (Last 24 hours) at 01/21/2018 1545 Last data filed at 01/21/2018 0600 Gross per 24 hour  Intake 1858.25 ml  Output 2300 ml  Net -441.75 ml   Filed Weights   01/19/18 0436  Weight: 39.3 kg (86 lb 10.3 oz)    Examination: General exam: Awake, laying in bed, in nad Respiratory system: Normal respiratory effort, no wheezing Cardiovascular system: regular rate, s1, s2 Gastrointestinal system: Soft, nondistended, positive BS Central nervous system: CN2-12 grossly intact, strength intact Extremities: Perfused, no clubbing Skin: Normal skin turgor, multiple sacral decub ulcers noted Psychiatry: Mood normal // no visual hallucinations   Data Reviewed: I have personally reviewed following labs and imaging studies  CBC: Recent Labs  Lab 01/18/18 2227 01/19/18 0441 01/20/18 0541 01/20/18 2323 01/21/18 0554  WBC 7.1 5.4 5.8  --  4.6  NEUTROABS 6.0  --   --   --   --   HGB 7.7* 8.0* 6.2* 8.5* 8.4*  HCT 26.2* 26.8* 21.1* 28.2* 27.3*  MCV 66.0* 67.3* 67.4*  --  68.8*  PLT 185 195 196  --  230   Basic Metabolic Panel: Recent Labs  Lab 01/18/18 2227 01/19/18 0441 01/20/18 0541 01/21/18 0554  NA 134* 137 135 135  K 3.4* 3.2* 3.3* 4.3  CL 100* 104 103 103  CO2 27 25 27 27   GLUCOSE 109* 93 96 102*  BUN 13 11 9 7   CREATININE 0.47 0.47 0.45 0.39*  CALCIUM 8.1* 7.9* 7.7* 8.0*   GFR: Estimated Creatinine Clearance: 62.1 mL/min (A) (by C-G formula based on SCr of 0.39 mg/dL (L)). Liver Function Tests: Recent Labs  Lab 01/18/18 2227 01/21/18 0554  AST 21 16  ALT 13* 10*  ALKPHOS 60 53  BILITOT 0.4 0.1*  PROT 7.3 6.6  ALBUMIN 2.6* 2.3*   No results for input(s): LIPASE, AMYLASE in the last 168 hours. No results for input(s): AMMONIA in the last 168 hours. Coagulation Profile: No results for input(s): INR, PROTIME in the  last 168 hours. Cardiac Enzymes: No results for input(s): CKTOTAL, CKMB, CKMBINDEX, TROPONINI in the last 168 hours. BNP (last 3 results) No results for input(s): PROBNP in the last 8760 hours. HbA1C: No results for input(s): HGBA1C in the last 72 hours. CBG: No results for input(s): GLUCAP in the last 168 hours. Lipid Profile: No results for input(s): CHOL, HDL, LDLCALC, TRIG, CHOLHDL, LDLDIRECT in the last 72 hours. Thyroid Function Tests: No results for input(s): TSH, T4TOTAL, FREET4, T3FREE, THYROIDAB in the last 72 hours. Anemia Panel: Recent Labs    01/18/18 2227 01/19/18 0042  VITAMINB12  --  375  FOLATE  --  15.1  FERRITIN  --  37  TIBC  --  181*  IRON  --  9*  RETICCTPCT 1.8  --  Sepsis Labs: Recent Labs  Lab 01/18/18 2243 01/19/18 0441  PROCALCITON  --  0.47  LATICACIDVEN 1.05  --     Recent Results (from the past 240 hour(s))  Blood Culture (routine x 2)     Status: None (Preliminary result)   Collection Time: 01/18/18 10:07 PM  Result Value Ref Range Status   Specimen Description BLOOD LEFT UPPER ARM  Final   Special Requests IN PEDIATRIC BOTTLE Blood Culture adequate volume  Final   Culture   Final    NO GROWTH 3 DAYS Performed at [facility], [address], [place], [place] [address]    Report Status PENDING  Incomplete  Blood Culture (routine x 2)     Status: None (Preliminary result)   Collection Time: 01/18/18 10:27 PM  Result Value Ref Range Status   Specimen Description BLOOD RIGHT ANTECUBITAL  Final   Special Requests   Final    BOTTLES DRAWN AEROBIC ONLY Blood Culture adequate volume   Culture   Final    NO GROWTH 3 DAYS Performed at [facility], [address] [place], [place] [address]    Report Status PENDING  Incomplete     Radiology Studies: Mr Lumbar Spine W Wo Contrast  Result Date: 01/19/2018 CLINICAL DATA:  Initial evaluation for worsening pressure ulcer. History of paraplegia. EXAM: MRI LUMBAR SPINE WITHOUT  AND WITH CONTRAST TECHNIQUE: Multiplanar and multiecho pulse sequences of the lumbar spine were obtained without and with intravenous contrast. CONTRAST:  7mL MULTIHANCE GADOBENATE DIMEGLUMINE 529 MG/ML IV SOLN COMPARISON:  Prior CT from 09/16/2017. FINDINGS: Segmentation: Normal segmentation. Lowest well-formed disc labeled the L5-S1 level. Alignment: Mild exaggeration of the normal lumbar lordosis with trace dextroscoliosis. No listhesis or subluxation. Vertebrae: Posterior spinal fusion hardware partially visualized within the lower thoracic spine. Vertebral body heights are maintained without evidence for acute or chronic fracture. Bone marrow signal intensity within normal limits. No discrete or worrisome osseous lesions. No abnormal marrow edema or the enhancement to suggest acute osteomyelitis. Conus medullaris and cauda equina: Conus low lying terminating at approximately the L3-4 level. Incidental note made of a small terminal lipoma at the level of L5-S1 (series 8, image 28). Nerve roots of the cauda equina otherwise unremarkable. Paraspinal and other soft tissues: Decubitus ulcer with skin breakdown partially visualized just to the right of midline within the lower back, posterior to the sacrum. Associated mild soft tissue edema with enhancement at the site of ulceration without discrete collection. Changes largely confined to the subcutaneous fat without visible subfascial extension on this exam. No evidence for associated osteomyelitis within the subjacent sacrum. Susceptibility artifact from IVC filter noted. Note made of a horseshoe kidney. Paraspinous soft tissues demonstrate no other acute abnormality. Visualized visceral structures otherwise unremarkable. Disc levels: No significant disc pathology seen within the lumbar spine. No disc bulge or disc protrusion. Intervertebral discs are well hydrated. No significant facet hypertrophy. No canal or neural foraminal stenosis. No impingement. IMPRESSION:  1. Decubitus ulcer within the right paramedian back, posterior to the sacrum as above. No loculated or drainable fluid collections identified. No evidence associated osteomyelitis within the subjacent sacrum. No other evidence for acute infection within the lumbar spine. 2. No other acute abnormality within the lumbar spine. Chronic incidental findings as above. Electronically Signed   By: Rise Mu M.D.   On: 01/19/2018 19:11   Mr Hip Right W Wo Contrast  Result Date: 01/19/2018 CLINICAL DATA:  Worsening pressure ulcer. EXAM: MRI OF  THE RIGHT HIP WITHOUT AND WITH CONTRAST TECHNIQUE: Multiplanar, multisequence MR imaging was performed both before and after administration of intravenous contrast. CONTRAST:  7 cc MultiHance COMPARISON:  CT scan 09/16/2017 FINDINGS: Abnormal signal intensity and enhancement in the greater trochanter of the right hip consistent with osteomyelitis. There is a decubitus ulcer extending down to the bone. Sclerotic appearance of the ischial tuberosities bilaterally but no abnormal increased T2 signal intensity or enhancement to suggest active osteomyelitis. This is likely healed osteomyelitis. Deep decubitus ulcers bilaterally. The pubic symphysis and sacrum are intact. No obvious osteomyelitis. Cellulitis and fairly diffuse myofasciitis involving the hip and pelvic musculature bilaterally, right greater than left. There appears to be scattered air in the soft tissues likely due to the decubiti. No obvious acute intrapelvic process. A suprapubic catheter is noted. Both ovaries appear normal. No findings to suggest septic arthritis involving the SI joints, pubic symphysis or hip joints. IMPRESSION: 1. Decubitus ulcers with diffuse cellulitis and myofasciitis. 2. Suspect scattered air in the soft tissues likely due to the decubiti. 3. Osteomyelitis involving the right greater trochanter. 4. No other definite areas of osteomyelitis. No evidence of septic arthritis. 5. No  definite intrapelvic abscess is. Electronically Signed   By: Rudie MeyerP.  Gallerani M.D.   On: 01/19/2018 21:09    Scheduled Meds: . darifenacin  7.5 mg Oral Daily  . diazepam  10 mg Oral Q12H  . feeding supplement (ENSURE ENLIVE)  237 mL Oral BID BM  . HYDROmorphone  4 mg Oral Q6H  . pantoprazole  20 mg Oral QPC breakfast  . potassium chloride  40 mEq Oral Once  . pregabalin  75 mg Oral TID   Continuous Infusions: . sodium chloride Stopped (01/21/18 0824)  . cefTRIAXone (ROCEPHIN)  IV Stopped (01/20/18 1400)  . clindamycin (CLEOCIN) IV Stopped (01/21/18 0529)     LOS: 2 days   Rickey BarbaraStephen Rosealynn Mateus, MD [facility] Pager [phone]  If 7PM-7AM, please contact night-coverage [email or web address] Password [email or web address] 01/21/2018, 3:45 PM

## 2018-01-21 NOTE — Care Management Note (Signed)
Case Management Note  Patient Details  Name: Simeon CraftBritany D Salguero MRN: [id number] Date of Birth: [birth year]  Subjective/Objective: Patient for PICC today. Long term iv abx. Patient has used [facility] in past for iv abx-chose [facility] again-rep Pam rep iv infusion aware,& [facility]-rep Clydie BraunKaren will follow for HHRN-instruction. Patient states fiance will asst w/care. [facility] is also aware of wound care needs & supplies. Await [facility],iv abx script, picc flush.                    Action/Plan:d/c home w/[facility]HC/iv abx.   Expected Discharge Date:                  Expected Discharge Plan:  Home w Home Health Services  In-House Referral:     Discharge planning Services  CM Consult  Post Acute Care Choice:    Choice offered to:  Patient  DME Arranged:    DME Agency:     HH Arranged:    HH Agency:     Status of Service:  In process, will continue to follow  If discussed at Long Length of Stay Meetings, dates discussed:    Additional Comments:  Lanier ClamMahabir, Sawsan Riggio, RN 01/21/2018, 3:51 PM

## 2018-01-21 NOTE — Procedures (Signed)
S/p RUE SL POWER PICC  TIP SVCRA NO COMP STABLE EBL 0 READY FOR USE FULL REPORT IN PACS

## 2018-01-21 NOTE — Progress Notes (Signed)
Attempt PICC left arm unsuccessful.  Gain access to vein without difficulty.  Unable to thread PICC central meeting some type of resistance.  If PICC still desired may consider IR placement.

## 2018-01-22 LAB — CBC
HCT: 29 % — ABNORMAL LOW (ref 36.0–46.0)
Hemoglobin: 8.7 g/dL — ABNORMAL LOW (ref 12.0–15.0)
MCH: 20.8 pg — ABNORMAL LOW (ref 26.0–34.0)
MCHC: 30 g/dL (ref 30.0–36.0)
MCV: 69.2 fL — AB (ref 78.0–100.0)
Platelets: 275 10*3/uL (ref 150–400)
RBC: 4.19 MIL/uL (ref 3.87–5.11)
RDW: 22.2 % — ABNORMAL HIGH (ref 11.5–15.5)
WBC: 4.4 10*3/uL (ref 4.0–10.5)

## 2018-01-22 LAB — BASIC METABOLIC PANEL
Anion gap: 6 (ref 5–15)
BUN: 9 mg/dL (ref 6–20)
CALCIUM: 8.4 mg/dL — AB (ref 8.9–10.3)
CHLORIDE: 102 mmol/L (ref 101–111)
CO2: 32 mmol/L (ref 22–32)
CREATININE: 0.33 mg/dL — AB (ref 0.44–1.00)
Glucose, Bld: 98 mg/dL (ref 65–99)
Potassium: 4.2 mmol/L (ref 3.5–5.1)
SODIUM: 140 mmol/L (ref 135–145)

## 2018-01-22 MED ORDER — CEFTRIAXONE IV (FOR PTA / DISCHARGE USE ONLY): 1.0000 g | INTRAVENOUS | 0 refills | Status: AC

## 2018-01-22 MED ORDER — METRONIDAZOLE 500 MG PO TABS: 500.0000 mg | ORAL_TABLET | Freq: Three times a day (TID) | ORAL | 0 refills | Status: DC

## 2018-01-22 NOTE — Care Management Important Message (Signed)
Important Message  Patient Details  Name: [name] MRN: [id number] Date of Birth: [birth year]   Medicare Important Message Given:  Yes    Caren MacadamFuller, Lissandro Dilorenzo 01/22/2018, 11:10 AMImportant Message  Patient Details  Name: [name] MRN: [id number] Date of Birth: [birth year]   Medicare Important Message Given:  Yes    Caren MacadamFuller, Finnley Larusso 01/22/2018, 11:10 AM

## 2018-01-22 NOTE — Discharge Summary (Signed)
Physician Discharge Summary  [name] ZOX:[id number] DOB: 18-Mar-1986 DOA: 01/18/2018  PCP: System, Pcp Not In  Admit date: 01/18/2018 Discharge date: 01/22/2018  Admitted From: Home Disposition:  Home  Recommendations for Outpatient Follow-up:  1. Follow up with PCP in 1-2 weeks 2. Follow up with Wound Care clinic 3. Recommend repeat HCG in 1-2 weeks 4. Continue routine PICC care 5. Please remove PICC after completing antibiotics on 02/18/18 6. Wound care as follows:  Wound care to right ischial tuberosity, left ischial tuberosity and sacral healing Stage 4 Pressure injuries: cleanse with NS, pat gently dry. Cover with silver hydrofiber dressing (Aquacel Ag+, Lawson # [id number]). Top with dry gauze 4x4s and ABD pad and secure with Medipore tape. Change twice daily. Keep HOB at or below a 30 degree angle. Float heels.  Wound care to left upper buttock, left lateral LE and left hip partial thickness tissue injuries: Cleanse with NS, pat gently dry. Cover with xeroform gauze, top with dry gauze 4x4 and secure with Kerlix or Medipore tape. Change twice daily.  Home Health:RN   Discharge Condition:Stable CODE STATUS:Full Diet recommendation: Regular   Brief/Interim Summary: 32 year old female with history of quadriparesis status post cervical injury, scoliosis surgery, chronic anemia, mostly bedbound was brought to the hospital septic with fever and confusion, she was started on broad-spectrum antibiotics.  Sepsis presumed for infected non-healing sacral decubitus ulcers with osteomyelitis of R greater trochanter -MRI reviewed. Findings of decub ulcers without drainable abscess. Osteomyelitis involving R greater trochanter noted -Appreciate input by WOC, noted.  -Patient did develop a reaction to vancomycin or Zosyn, later placed on clindamycin and ceftriaxone for broad coverage -Blood cultures negative thus far -Discussed case with ID who recommends 4 weeks of rocephin IV with PO  flagyl and close follow up with outpatient wound care. Last day of antibiotics is on 02/18/18. ID to follow up with patient in 4 weeks -discussed with general surgery who had seen patient. No indication for surgery  Microcytic hypochromic anemia with worsening of baseline hemoglobin - improved with one unit PRBC per below  History of quadriparesis from cervical trauma and history of scoliosis surgery. -Continue present medications for pain and muscle spasm.  -Stable at present  Mild nausea -abdomen is mildly distended. Patient states when she is about to get her menstrual cycle her abdomen gets distended.  - Stable currently  Acute blood loss anemia -Likely secondary to menstrual bleed -Patient reports monthly period is to begin in several days -Hgb noted to be low of 6.2 noted, improved to 8.7 after one unit PRBC transfusion  Elevated HCG -Follow up pregnancy test reviewed, negative -Suspect related to menstrual period that is to begin in the next several days -recommend repeat HCG in 1-2 weeks   Discharge Diagnoses:  Principal Problem:   Sepsis ([place]) Active Problems:   Wound infection   Microcytic hypochromic anemia   Infected decubitus ulcer, unspecified pressure ulcer stage   Pressure injury of skin   Recurrent UTI   Spinal cord injury, cervical region [facility])   Quadriplegia following spinal cord injury [facility])    Discharge Instructions  Discharge Instructions    Home infusion instructions Aneta May follow [facility] Dosing Protocol; May administer Cathflo as needed to maintain patency of vascular access device.; Flushing of vascular access device: per [facility] Protocol: 0.9% NaCl pre/post medica...   Complete by:  As directed    Instructions:  May follow [facility] Dosing Protocol   Instructions:  May administer Cathflo  as needed to maintain patency of vascular access device.   Instructions:  Flushing of vascular access device: per [facility] Protocol:  0.9% NaCl pre/post medication administration and prn patency; Heparin 100 u/ml, 70m for implanted ports and Heparin 10u/ml, 565mfor all other central venous catheters.   Instructions:  May follow [facility] Anaphylaxis Protocol for First Dose Administration in the home: 0.9% NaCl at 25-50 ml/hr to maintain IV access for protocol meds. Epinephrine 0.3 ml IV/IM PRN and Benadryl 25-50 IV/IM PRN s/s of anaphylaxis.   Instructions:  [facility] Coordinator (RN) to assist per patient IV care needs in the home PRN.     Allergies as of 01/22/2018      Reactions   Metoclopramide Hcl Other (See Comments)   seizures   Benadryl [diphenhydramine Hcl] Rash   Gabapentin Swelling   Morphine Rash   Vancomycin Itching, Rash   Zosyn [piperacillin-tazobactam In Dex] Itching, Rash      Medication List    TAKE these medications   albuterol 108 (90 Base) MCG/ACT inhaler Commonly known as:  PROVENTIL HFA;VENTOLIN HFA Inhale 2 puffs into the lungs every 4 (four) hours as needed for wheezing or shortness of breath.   benzonatate 100 MG capsule Commonly known as:  TESSALON Take 1 capsule (100 mg total) by mouth every 8 (eight) hours.   cefTRIAXone IVPB Commonly known as:  ROCEPHIN Inject 1 g into the vein daily for 27 days. Indication:  Sacral decubitus ulcer w/ osteo of R greater trochanter Last Day of Therapy:  02/18/2018 Labs - Once weekly:  CBC/D and BMP, Labs - Every other week:  ESR and CRP   diazepam 5 MG tablet Commonly known as:  VALIUM Take 10 mg by mouth every 12 (twelve) hours.   HYDROmorphone 4 MG tablet Commonly known as:  DILAUDID Take 4 mg by mouth every 6 (six) hours.   ibuprofen 200 MG tablet Commonly known as:  ADVIL,MOTRIN Take 200 mg by mouth daily as needed for moderate pain.   metroNIDAZOLE 500 MG tablet Commonly known as:  FLAGYL Take 1 tablet (500 mg total) by mouth 3 (three) times daily for 27 days.   ondansetron 4 MG disintegrating tablet Commonly known as:   ZOFRAN ODT Take 1 tablet (4 mg total) by mouth every 8 (eight) hours as needed for nausea or vomiting.   pantoprazole 20 MG tablet Commonly known as:  PROTONIX Take 20 mg by mouth daily after breakfast.   pregabalin 75 MG capsule Commonly known as:  LYRICA Take 75 mg by mouth 3 (three) times daily.   promethazine 25 MG tablet Commonly known as:  PHENERGAN Take 1 tablet (25 mg total) by mouth every 8 (eight) hours as needed for nausea or vomiting.   solifenacin 10 MG tablet Commonly known as:  VESICARE Take 10 mg by mouth every morning.            Home Infusion Instuctions  (From admission, onward)        Start     Ordered   01/22/18 0000  Home infusion instructions [facility] May follow [facility] Protocol; May administer Cathflo as needed to maintain patency of vascular access device.; Flushing of vascular access device: [facility]: 0.9% NaCl pre/post medica...    Question Answer Comment  Instructions [facility] follow [facility]   Instructions May administer Cathflo as needed to maintain patency of vascular access device.   Instructions Flushing of vascular access [facility]:  0.9% NaCl pre/post medication administration and prn patency; Heparin 100 u/ml, 19m for implanted ports and Heparin 10u/ml, 571mfor all other central venous catheters.   Instructions May follow [facility] Anaphylaxis Protocol for First Dose Administration in the home: 0.9% NaCl at 25-50 ml/hr to maintain IV access for protocol meds. Epinephrine 0.3 ml IV/IM PRN and Benadryl 25-50 IV/IM PRN s/s of anaphylaxis.   Instructions [facility] Infusion Coordinator (RN) to assist per patient IV care needs in the home PRN.      01/22/18 1423     Follow-up Information    Dillingham, ClLoel LoftyDO. Schedule an appointment as soon as possible for a visit.   Specialty:  Plastic Surgery Contact information: [phone]      Follow up with your PCP in 1-2 weeks. Schedule an appointment as soon as possible for a visit.        SnCarlyle BasquesMD. Schedule an appointment as soon as possible for a visit in 4 week(s).   Specialty:  Infectious Diseases Contact information: [address] [place] [phone]        Health, [facility]-Home Follow up.   Specialty:  [place]:  HH nursing-iv abx infusion,supplies,instruction,wound care. Contact information: [phone]        Allergies  Allergen Reactions  . Metoclopramide Hcl Other (See Comments)    seizures  . Benadryl [Diphenhydramine Hcl] Rash  . Gabapentin Swelling  . Morphine Rash  . Vancomycin Itching and Rash  . Zosyn [Piperacillin-Tazobactam In Dex] Itching and Rash    Consultations:  General Surgery  Discussed with Plastic Surgery  Discussed case with ID  Procedures/Studies: Mr Lumbar Spine W Wo Contrast  Result Date: 01/19/2018 CLINICAL DATA:  Initial evaluation for worsening pressure ulcer. History of paraplegia. EXAM: MRI LUMBAR SPINE WITHOUT AND WITH CONTRAST TECHNIQUE: Multiplanar and multiecho pulse sequences of the lumbar spine were obtained without and with intravenous contrast. CONTRAST:  9m73mULTIHANCE GADOBENATE DIMEGLUMINE 529 MG/ML IV SOLN COMPARISON:  Prior CT from 09/16/2017. FINDINGS: Segmentation: Normal segmentation. Lowest well-formed disc labeled the L5-S1 level. Alignment: Mild exaggeration of the normal lumbar lordosis with trace dextroscoliosis. No listhesis or subluxation. Vertebrae: Posterior spinal fusion hardware partially visualized within the lower thoracic spine. Vertebral body heights are maintained without evidence for acute or chronic fracture. Bone marrow signal intensity within normal limits. No discrete or worrisome osseous lesions. No abnormal marrow edema or the enhancement  to suggest acute osteomyelitis. Conus medullaris and cauda equina: Conus low lying terminating at approximately the L3-4 level. Incidental note made of a small terminal lipoma at the level of L5-S1 (series 8, image 28). Nerve roots of the cauda equina otherwise unremarkable. Paraspinal and other soft tissues: Decubitus ulcer with skin breakdown partially visualized just to the right of midline within the lower back, posterior to the sacrum. Associated mild soft tissue edema with enhancement at the site of ulceration without discrete collection. Changes largely confined to the subcutaneous fat without visible subfascial extension on this exam. No evidence for associated osteomyelitis within the subjacent sacrum. Susceptibility artifact from IVC filter noted. Note made of a horseshoe kidney. Paraspinous soft tissues demonstrate no other acute abnormality. Visualized visceral structures otherwise unremarkable. Disc levels: No significant disc pathology seen within the lumbar spine. No disc bulge or disc protrusion. Intervertebral discs are well hydrated. No significant facet hypertrophy. No  canal or neural foraminal stenosis. No impingement. IMPRESSION: 1. Decubitus ulcer within the right paramedian back, posterior to the sacrum as above. No loculated or drainable fluid collections identified. No evidence associated osteomyelitis within the subjacent sacrum. No other evidence for acute infection within the lumbar spine. 2. No other acute abnormality within the lumbar spine. Chronic incidental findings as above. Electronically Signed   By: Jeannine Boga M.D.   On: 01/19/2018 19:11   Mr Hip Right W Wo Contrast  Result Date: 01/19/2018 CLINICAL DATA:  Worsening pressure ulcer. EXAM: MRI OF THE RIGHT HIP WITHOUT AND WITH CONTRAST TECHNIQUE: Multiplanar, multisequence MR imaging was performed both before and after administration of intravenous contrast. CONTRAST:  7 cc MultiHance COMPARISON:  CT scan 09/16/2017  FINDINGS: Abnormal signal intensity and enhancement in the greater trochanter of the right hip consistent with osteomyelitis. There is a decubitus ulcer extending down to the bone. Sclerotic appearance of the ischial tuberosities bilaterally but no abnormal increased T2 signal intensity or enhancement to suggest active osteomyelitis. This is likely healed osteomyelitis. Deep decubitus ulcers bilaterally. The pubic symphysis and sacrum are intact. No obvious osteomyelitis. Cellulitis and fairly diffuse myofasciitis involving the hip and pelvic musculature bilaterally, right greater than left. There appears to be scattered air in the soft tissues likely due to the decubiti. No obvious acute intrapelvic process. A suprapubic catheter is noted. Both ovaries appear normal. No findings to suggest septic arthritis involving the SI joints, pubic symphysis or hip joints. IMPRESSION: 1. Decubitus ulcers with diffuse cellulitis and myofasciitis. 2. Suspect scattered air in the soft tissues likely due to the decubiti. 3. Osteomyelitis involving the right greater trochanter. 4. No other definite areas of osteomyelitis. No evidence of septic arthritis. 5. No definite intrapelvic abscess is. Electronically Signed   By: Marijo Sanes M.D.   On: 01/19/2018 21:09   Dg Chest Portable 1 View  Result Date: 01/19/2018 CLINICAL DATA:  Fever and chills for 72 hours. EXAM: PORTABLE CHEST 1 VIEW COMPARISON:  01/07/2018 FINDINGS: Thoracic scoliosis with posterior rod and screw fixation. Postoperative changes in the cervical spine. Shallow inspiration. Heart size and pulmonary vascularity are normal. Lungs are clear and expanded. No blunting of costophrenic angles. No pneumothorax. IMPRESSION: No active disease. Electronically Signed   By: Lucienne Capers M.D.   On: 01/19/2018 00:24   Dg Chest Port 1 View  Result Date: 01/07/2018 CLINICAL DATA:  Chest pain after upper endoscopy. EXAM: PORTABLE CHEST 1 VIEW COMPARISON:  Chest x-ray of  May 18, 2017. FINDINGS: The lungs are adequately inflated. There is no focal infiltrate, pneumothorax, pneumomediastinum, or pleural effusion. The heart is top-normal in size. There is chronic levocurvature centered in the upper thoracic spine. Within the upper abdomen no abnormal gas collections are observed. IMPRESSION: There is no acute cardiopulmonary abnormality. Electronically Signed   By: David  Martinique M.D.   On: 01/07/2018 10:29   Dg Abd Portable 1v  Result Date: 01/07/2018 CLINICAL DATA:  Patient status post EGD.  Chest pain. EXAM: PORTABLE ABDOMEN - 1 VIEW COMPARISON:  Abdominal radiograph 09/10/2017 FINDINGS: Thoracic spinal fusion hardware. IVC filter. Stool throughout the colon. Gas is demonstrated within nondilated loops of large and small bowel. Osteopenia. IMPRESSION: Nonobstructed bowel gas pattern. Stool throughout the colon as can be seen with constipation. Electronically Signed   By: Lovey Newcomer M.D.   On: 01/07/2018 10:28   Irpicc Placement Left >5 Yrs Inc Img Guide  Result Date: 01/21/2018 INDICATION: OSTEOMYELITIS, ACCESS FOR LONG-TERM ANTIBIOTICS EXAM: ULTRASOUND AND  FLUOROSCOPIC GUIDED PICC LINE INSERTION MEDICATIONS: 1% lidocaine local CONTRAST:  None FLUOROSCOPY TIME:  Forty-two seconds (2 mGy) COMPLICATIONS: None immediate. TECHNIQUE: The procedure, risks, benefits, and alternatives were explained to the patient and informed written consent was obtained. A timeout was performed prior to the initiation of the procedure. The right upper extremity was prepped with chlorhexidine in a sterile fashion, and a sterile drape was applied covering the operative field. Maximum barrier sterile technique with sterile gowns and gloves were used for the procedure. A timeout was performed prior to the initiation of the procedure. Local anesthesia was provided with 1% lidocaine. Under direct ultrasound guidance, the right brachial vein was accessed with a micropuncture kit after the overlying soft  tissues were anesthetized with 1% lidocaine. An ultrasound image was saved for documentation purposes. A guidewire was advanced to the level of the superior caval-atrial junction for measurement purposes and the PICC line was cut to length. A peel-away sheath was placed and a 27 cm, 5 [place], single lumen was inserted to level of the superior caval-atrial junction. A post procedure spot fluoroscopic was obtained. The catheter easily aspirated and flushed and was sutured in place. A dressing was placed. The patient tolerated the procedure well without immediate post procedural complication. FINDINGS: After catheter placement, the tip lies within the superior cavoatrial junction. The catheter aspirates and flushes normally and is ready for immediate use. IMPRESSION: Successful ultrasound and fluoroscopic guided placement of a right brachial vein approach, 27 cm, 5 French, single lumen PICC with tip at the superior caval-atrial junction. The PICC line is ready for immediate use. Electronically Signed   By: Jerilynn Mages.  Shick M.D.   On: 01/21/2018 16:50    Subjective: Eager to go home  Discharge Exam: Vitals:   01/22/18 0641 01/22/18 1300  BP: 100/60 100/64  Pulse: 70   Resp: 18 18  Temp: 98.3 F (36.8 C) 98.2 F (36.8 C)  SpO2: 98% 98%   Vitals:   01/21/18 1339 01/21/18 2014 01/22/18 0641 01/22/18 1300  BP: (!) 145/90 107/67 100/60 100/64  Pulse: 94 87 70   Resp: 18 18 18 18   Temp: 98 F (36.7 C) 98.1 F (36.7 C) 98.3 F (36.8 C) 98.2 F (36.8 C)  TempSrc: Oral Oral Oral Oral  SpO2: 98% 100% 98% 98%  Weight:      Height:        General: Pt is alert, awake, not in acute distress Cardiovascular: RRR, S1/S2 +, no rubs, no gallops Respiratory: CTA bilaterally, no wheezing, no rhonchi Abdominal: Soft, NT, ND, bowel sounds + Extremities: no edema, no cyanosis   The results of significant diagnostics from this hospitalization (including imaging, microbiology, ancillary and laboratory) are listed  below for reference.     Microbiology: Recent Results (from the past 240 hour(s))  Blood Culture (routine x 2)     Status: None (Preliminary result)   Collection Time: 01/18/18 10:07 PM  Result Value Ref Range Status   Specimen Description   Final    BLOOD LEFT UPPER ARM Performed at [facility] [address] [place], [place] [address]    Special Requests   Final    IN PEDIATRIC BOTTLE Blood Culture adequate volume Performed at [facility] [address], [place], [place] [address]    Culture   Final    NO GROWTH 4 DAYS Performed at [facility], [place] [address], [place], [place] [address]    Report Status PENDING  Incomplete  Blood Culture (routine x 2)     Status: None (Preliminary result)   Collection Time: 01/18/18 10:27 PM  Result Value Ref Range Status   Specimen Description   Final    BLOOD RIGHT ANTECUBITAL Performed at [facility] [address] [place], [place] [address]    Special Requests   Final    BOTTLES DRAWN AEROBIC ONLY Blood Culture adequate volume Performed at [facility] [address] [place], [place] [address]    Culture   Final    NO GROWTH 4 DAYS Performed at [facility], [place] [address], [place], [place] [address]    Report Status PENDING  Incomplete     Labs: BNP (last 3 results) No results for input(s): BNP in the last 8760 hours. Basic Metabolic Panel: Recent Labs  Lab 01/18/18 2227 01/19/18 0441 01/20/18 0541 01/21/18 0554 01/22/18 0442  NA 134* 137 135 135 140  K 3.4* 3.2* 3.3* 4.3 4.2  CL 100* 104 103 103 102  CO2 27 25 27 27  32  GLUCOSE 109* 93 96 102* 98  BUN 13 11 9 7 9   CREATININE 0.47 0.47 0.45 0.39* 0.33*  CALCIUM 8.1* 7.9* 7.7* 8.0* 8.4*   Liver Function Tests: Recent Labs  Lab 01/18/18 2227 01/21/18 0554  AST 21 16  ALT 13* 10*  ALKPHOS 60 53  BILITOT 0.4 0.1*  PROT 7.3 6.6  ALBUMIN 2.6* 2.3*   No results for  input(s): LIPASE, AMYLASE in the last 168 hours. No results for input(s): AMMONIA in the last 168 hours. CBC: Recent Labs  Lab 01/18/18 2227 01/19/18 0441 01/20/18 0541 01/20/18 2323 01/21/18 0554 01/22/18 0442  WBC 7.1 5.4 5.8  --  4.6 4.4  NEUTROABS 6.0  --   --   --   --   --   HGB 7.7* 8.0* 6.2* 8.5* 8.4* 8.7*  HCT 26.2* 26.8* 21.1* 28.2* 27.3* 29.0*  MCV 66.0* 67.3* 67.4*  --  68.8* 69.2*  PLT 185 195 196  --  230 275   Cardiac Enzymes: No results for input(s): CKTOTAL, CKMB, CKMBINDEX, TROPONINI in the last 168 hours. BNP: Invalid input(s): POCBNP CBG: No results for input(s): GLUCAP in the last 168 hours. D-Dimer No results for input(s): DDIMER in the last 72 hours. Hgb A1c No results for input(s): HGBA1C in the last 72 hours. Lipid Profile No results for input(s): CHOL, HDL, LDLCALC, TRIG, CHOLHDL, LDLDIRECT in the last 72 hours. Thyroid function studies No results for input(s): TSH, T4TOTAL, T3FREE, THYROIDAB in the last 72 hours.  Invalid input(s): FREET3 Anemia work up No results for input(s): VITAMINB12, FOLATE, FERRITIN, TIBC, IRON, RETICCTPCT in the last 72 hours. Urinalysis    Component Value Date/Time   COLORURINE AMBER (A) 01/18/2018 2202   APPEARANCEUR CLOUDY (A) 01/18/2018 2202   LABSPEC 1.016 01/18/2018 2202   PHURINE 6.0 01/18/2018 2202   GLUCOSEU NEGATIVE 01/18/2018 2202   HGBUR NEGATIVE 01/18/2018 2202   BILIRUBINUR NEGATIVE 01/18/2018 2202   [place] 01/18/2018 2202   PROTEINUR 100 (A) 01/18/2018 2202   UROBILINOGEN 0.2 08/03/2015 1714   NITRITE NEGATIVE 01/18/2018 2202   LEUKOCYTESUR LARGE (A) 01/18/2018 2202   Sepsis Labs Invalid input(s): PROCALCITONIN,  WBC,  LACTICIDVEN Microbiology Recent Results (from the past 240 hour(s))  Blood Culture (routine x 2)     Status: None (Preliminary result)   Collection Time: 01/18/18 10:07 PM  Result Value Ref Range Status   Specimen Description   Final  BLOOD LEFT UPPER  ARM Performed at [facility] [address], [place], [place] [address]    Special Requests   Final    IN PEDIATRIC BOTTLE Blood Culture adequate volume Performed at [facility] [address], [place], [place] [address]    Culture   Final    NO GROWTH 4 DAYS Performed at [facility], [place] [address] [place], [place] [address]    Report Status PENDING  Incomplete  Blood Culture (routine x 2)     Status: None (Preliminary result)   Collection Time: 01/18/18 10:27 PM  Result Value Ref Range Status   Specimen Description   Final    BLOOD RIGHT ANTECUBITAL Performed at [facility] [address] [place], [place] [address]    Special Requests   Final    BOTTLES DRAWN AEROBIC ONLY Blood Culture adequate volume Performed at [facility] [address] [place], [place] [address]    Culture   Final    NO GROWTH 4 DAYS Performed at [facility], [place] [address] [place], [place] [address]    Report Status PENDING  Incomplete     SIGNED:   Marylu Lund, MD  [facility] 01/22/2018, 2:28 PM  If 7PM-7AM, please contact night-coverage [email or web address] Password [email or web address]

## 2018-01-22 NOTE — Progress Notes (Signed)
PHARMACY CONSULT NOTE FOR:  OUTPATIENT  PARENTERAL ANTIBIOTIC THERAPY (OPAT)  Indication: non-healing sacral decubitus ulcers with osteomyelitis of R greater trochanter Regimen: Rocephin 1g IV q24 hr (+ Flagyl PO)  End date: 02/18/2018  IV antibiotic discharge orders are pended. To discharging provider:  please sign these orders via discharge navigator,  Select New Orders & click on the button choice - Manage This Unsigned Work.     Thank you for allowing pharmacy to be a part of this patient's care.  Bernadene Personrew Arsh Feutz, PharmD, BCPS [phone] 01/22/2018, 10:03 AM

## 2018-01-22 NOTE — Care Management Note (Signed)
Case Management Note  Patient Details  Name: [name] MRN: [id number] Date of Birth: Jan 18, 1986  Subjective/Objective: [facility] already following for HHRN-iv abx infusion,supplies,iv abx instruction,picc line flush,labs per protocal. [facility] infusion rep Pam will provide iv abx,supplies, start initial instruction with patient. [facility] rep Clydie BraunKaren will provide HHRN-ongoing instruction. Family able to transport home on own.                    Action/Plan:d/c home w/HHC/IV abx   Expected Discharge Date:                  Expected Discharge Plan:  Home w Home Health Services  In-House Referral:     Discharge planning Services  CM Consult  Post Acute Care Choice:    Choice offered to:  Patient  DME Arranged:    DME Agency:     HH Arranged:  RN, IV Antibiotics HH Agency:  [facility]  Status of Service:  Completed, signed off  If discussed at Long Length of Stay Meetings, dates discussed:    Additional Comments:  Lanier ClamMahabir, Renella Steig, RN 01/22/2018, 9:29 AM

## 2018-01-23 LAB — CULTURE, BLOOD (ROUTINE X 2)
CULTURE: NO GROWTH
Culture: NO GROWTH
Special Requests: ADEQUATE
Special Requests: ADEQUATE

## 2018-02-07 ENCOUNTER — Other Ambulatory Visit: Payer: Self-pay

## 2018-02-07 ENCOUNTER — Emergency Department (HOSPITAL_COMMUNITY)
Admission: EM | Admit: 2018-02-07 | Discharge: 2018-02-08 | Disposition: A | Discharge status: Home / Self Care | Payer: Medicare Other | Attending: Emergency Medicine | Admitting: Emergency Medicine

## 2018-02-07 ENCOUNTER — Encounter (HOSPITAL_COMMUNITY): Payer: Self-pay | Admitting: *Deleted

## 2018-02-07 ENCOUNTER — Emergency Department (HOSPITAL_COMMUNITY): Payer: Medicare Other

## 2018-02-07 DIAGNOSIS — R6883 Chills (without fever): Secondary | ICD-10-CM | POA: Diagnosis not present

## 2018-02-07 DIAGNOSIS — G8250 Quadriplegia, unspecified: Secondary | ICD-10-CM | POA: Insufficient documentation

## 2018-02-07 DIAGNOSIS — Z79899 Other long term (current) drug therapy: Secondary | ICD-10-CM | POA: Insufficient documentation

## 2018-02-07 DIAGNOSIS — M79672 Pain in left foot: Secondary | ICD-10-CM

## 2018-02-07 LAB — BASIC METABOLIC PANEL
Anion gap: 6 (ref 5–15)
BUN: 13 mg/dL (ref 6–20)
CHLORIDE: 105 mmol/L (ref 101–111)
CO2: 29 mmol/L (ref 22–32)
CREATININE: 0.47 mg/dL (ref 0.44–1.00)
Calcium: 8.4 mg/dL — ABNORMAL LOW (ref 8.9–10.3)
GFR calc Af Amer: >60 mL/min (ref >60)
GFR calc non Af Amer: >60 mL/min (ref >60)
GLUCOSE: 99 mg/dL (ref 65–99)
Potassium: 3.8 mmol/L (ref 3.5–5.1)
Sodium: 140 mmol/L (ref 135–145)

## 2018-02-07 LAB — CBC WITH DIFFERENTIAL/PLATELET
Basophils Absolute: 0.1 10*3/uL (ref 0.0–0.1)
Basophils Relative: 1 %
Eosinophils Absolute: 0.4 10*3/uL (ref 0.0–0.7)
Eosinophils Relative: 6 %
HCT: 29.7 % — ABNORMAL LOW (ref 36.0–46.0)
Hemoglobin: 9 g/dL — ABNORMAL LOW (ref 12.0–15.0)
Lymphocytes Relative: 29 %
Lymphs Abs: 1.9 10*3/uL (ref 0.7–4.0)
MCH: 22.4 pg — ABNORMAL LOW (ref 26.0–34.0)
MCHC: 30.3 g/dL (ref 30.0–36.0)
MCV: 74.1 fL — ABNORMAL LOW (ref 78.0–100.0)
Monocytes Absolute: 0.6 10*3/uL (ref 0.1–1.0)
Monocytes Relative: 9 %
Neutro Abs: 3.5 10*3/uL (ref 1.7–7.7)
Neutrophils Relative %: 55 %
Platelets: 276 10*3/uL (ref 150–400)
RBC: 4.01 MIL/uL (ref 3.87–5.11)
RDW: 24.4 % — ABNORMAL HIGH (ref 11.5–15.5)
WBC: 6.5 10*3/uL (ref 4.0–10.5)

## 2018-02-07 LAB — URINALYSIS, ROUTINE W REFLEX MICROSCOPIC
Bilirubin Urine: NEGATIVE
Glucose, UA: NEGATIVE mg/dL
HGB URINE DIPSTICK: NEGATIVE
KETONES UR: 5 mg/dL — AB
Nitrite: NEGATIVE
PROTEIN: 100 mg/dL — AB
Specific Gravity, Urine: 1.024 (ref 1.005–1.030)
pH: 7 (ref 5.0–8.0)

## 2018-02-07 LAB — I-STAT BETA HCG BLOOD, ED (MC, WL, AP ONLY)

## 2018-02-07 LAB — I-STAT CG4 LACTIC ACID, ED: Lactic Acid, Venous: 1.11 mmol/L (ref 0.5–1.9)

## 2018-02-07 MED ORDER — SODIUM CHLORIDE 0.9 % IV BOLUS (SEPSIS)
1000.0000 mL | Freq: Once | INTRAVENOUS | Status: AC
  Administered 2018-02-07: 1000 mL via INTRAVENOUS

## 2018-02-07 MED ORDER — HYDROMORPHONE HCL 1 MG/ML IJ SOLN
1.0000 mg | Freq: Once | INTRAMUSCULAR | Status: AC
  Administered 2018-02-07: 1 mg via INTRAVENOUS
  Filled 2018-02-07: qty 1

## 2018-02-07 MED ORDER — HYDROMORPHONE HCL 2 MG PO TABS: 2.0000 mg | ORAL_TABLET | Freq: Three times a day (TID) | ORAL | 0 refills | Status: DC

## 2018-02-07 MED ORDER — HYDROMORPHONE HCL 1 MG/ML IJ SOLN
0.5000 mg | Freq: Once | INTRAMUSCULAR | Status: AC
  Administered 2018-02-08: 0.5 mg via INTRAVENOUS
  Filled 2018-02-07: qty 1

## 2018-02-07 NOTE — Discharge Instructions (Signed)
Take your previously prescribed pain medications for pain.  As we discussed, you need to follow-up with your primary care doctor tomorrow for further evaluation.  Return to the department for fever, chest pain, difficulty breathing, redness or swelling of the left foot, vomiting or any other worsening or concerning symptoms.

## 2018-02-07 NOTE — ED Notes (Signed)
Patient transported to X-ray. Delay in labs.

## 2018-02-07 NOTE — Consult Note (Signed)
Medical Consultation   [name]  ZOX:[id number]  DOB: 1986-08-14  DOA: 02/07/2018  PCP: System, Pcp Not In   Outpatient Specialists: Orthopedic surgery, infectious disease, wound care, neurology   Requesting physician: Emergency department  Reason for consultation: Hypotension   History of Present Illness: [name] is an 32 y.o. 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  Of note patient was admitted from 01/19/2018-2 12/2017 for sepsis from sacral decubitus ulcers and discharged home on oral metronidazole and IV ceftriaxone via PICC line.  Since then she has been doing well except she cannot be lifted up by her boyfriend she has begun to have worsening bilateral lower extremity edema.  She is also developed sharp pain over the malleolus of her left ankle and has decreased movement at that site.  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  She did want to be evaluated for possible sepsis as in 2017 she did present with septic shock.  She denies any pain around her PICC site.  She denies any fevers.  She denies any nausea, vomiting, abdominal pain, chest pain, rash.   ED course: In the EDPatient's vitals were notable for labile blood pressures from 87 systolic to 108 systolic.  Her labs showed a normal white blood cell count and her chronic anemia.  X-ray of her leg showed only soft tissue swelling and edema.  Medicine was consulted for hypotension.    Review of Systems:  ROS As per HPI otherwise 10 point review of systems  negative.     Past Medical History: Past Medical History:  Diagnosis Date  . Abnormal Pap smear   . Anemia   . ASCUS (atypical squamous cells of undetermined significance) on Pap smear 10/2011  . Asthma Mild, only with exposure to cigarette smoke  . Autonomic dysreflexia   . Chronic pain 2005  . Congenital absence of right kidney   . Decubitus ulcer of buttock, stage 2    bilateral-nondraining-redressed q other day  . Decubitus ulcer of coccygeal region, stage 1   . H/O scoliosis   . Horseshoe kidney   . Hx: UTI (urinary tract infection) 11/27/11  . Quadriplegia following spinal cord injury (HCC)   . Recurrent nephrolithiasis    s/p stent  . Recurrent UTI requires self catheterization  . Sciatica   . Spinal cord injury, cervical region (HCC) 05/19/2004   movement,sensation intact-unable to stand/ transfer since 2017 after septic shock episode per pt  . Urinary tract infection 03/2017    Past Surgical History: Past Surgical History:  Procedure Laterality Date  . BACK SURGERY  Scoliosis, Harrington Rods in place  . Bilateral leg surgery    . C3-6 spine surgery with rods    . dvt filter placement  2005  . ESOPHAGOGASTRODUODENOSCOPY (EGD) WITH PROPOFOL N/A 01/07/2018   Procedure: ESOPHAGOGASTRODUODENOSCOPY (EGD) WITH PROPOFOL;  Surgeon: Kathi Der, MD;  Location: [facility];  Service: Gastroenterology;  Laterality: N/A;  . INSERTION OF SUPRAPUBIC CATHETER N/A 05/15/2017   Procedure: INSERTION OF SUPRAPUBIC CATHETER;  Surgeon: Ihor Gully, MD;  Location:  [facility];  Service: Urology;  Laterality: N/A;  . LITHOTRIPSY    . ORTHOPEDIC SURGERY  Multiple lower extremity surgeries as a child  . VASCULAR SURGERY  IVC filter placed in 05-19-04     Allergies:   Allergies  Allergen Reactions  . Metoclopramide Hcl Other (See Comments)    seizures  . Benadryl [Diphenhydramine Hcl] Rash  . Gabapentin Swelling  . Morphine Rash  . Vancomycin Itching and Rash    . Zosyn [Piperacillin-Tazobactam In Dex] Itching and Rash     Social History:  reports that  has never smoked. she has never used smokeless tobacco. She reports that she does not drink alcohol or use drugs.   Family History: Family History  Problem Relation Age of Onset  . Heart disease Mother        Massive MI in 05/19/05 ; died    Unacceptable: Noncontributory, unremarkable, or negative. Acceptable: Family history reviewed and not pertinent (If you reviewed it)   Physical Exam: Vitals:   02/07/18 1845 02/07/18 1947 02/07/18 2008 02/07/18 2200  BP:  (!) 87/71 93/61 108/72  Pulse: 87  84 71  Resp: 18  18 20   Temp: 98 F (36.7 C)     TempSrc: Oral     SpO2: 99%  99% 100%  Weight:      Height:        Constitutional: Thin appearing, no acute distress Eyes: Anicteric sclera ENMT: Flaking lesions noted at the cusp of hair, moist mucous membranes Neck: Supple, no lymphadenopathy CVS: Regular rate and rhythm, normal S1-S2, no murmurs s  Respiratory: Clear to auscultation bilaterally, no wheezes Abdomen: Soft, nontender, nondistended, plus bowel sounds Musculoskeletal: : Noted to have joint deformities bilaterally, bilateral swelling in lower extremities, left lateral malleolus is tender to palpation but not warm, pain on flexion or extension, 1+ pulses bilaterally Neuro: Lower extremity flaccid paralysis Psych: judgement and insight appear normal, stable mood and affect, mental status Skin: PICC site is clean dry and intact, no erythema or swelling around left ankle, ulcers are currently covered with dressing   Data reviewed:  I have personally reviewed following labs and imaging studies Labs:  CBC: Recent Labs  Lab 02/07/18 1748  WBC 6.5  NEUTROABS 3.5  HGB 9.0*  HCT 29.7*  MCV 74.1*  PLT 276    Basic Metabolic Panel: Recent Labs  Lab 02/07/18 1748  NA 140  K 3.8  CL 105  CO2 29  GLUCOSE 99  BUN 13  CREATININE 0.47  CALCIUM 8.4*   GFR Estimated  Creatinine Clearance: 62.1 mL/min (by C-G formula based on SCr of 0.47 mg/dL). Liver Function Tests: No results for input(s): AST, ALT, ALKPHOS, BILITOT, PROT, ALBUMIN in the last 168 hours. No results for input(s): LIPASE, AMYLASE in the last 168 hours. No results for input(s): AMMONIA in the last 168 hours. Coagulation profile No results for input(s): INR, PROTIME in the last 168 hours.  Cardiac Enzymes: No results for input(s): CKTOTAL, CKMB, CKMBINDEX, TROPONINI in the last 168 hours. BNP: Invalid input(s): POCBNP CBG: No results for input(s): GLUCAP in the last 168 hours. D-Dimer No results for input(s): DDIMER in the last 72 hours. Hgb A1c No results for input(s): HGBA1C in the last 72 hours. Lipid Profile No results for input(s): CHOL, HDL, LDLCALC, TRIG, CHOLHDL, LDLDIRECT in the last 72 hours. Thyroid function studies No results for input(s): TSH, T4TOTAL, T3FREE, THYROIDAB in the last 72 hours.  Invalid input(s): FREET3 Anemia work up No  results for input(s): VITAMINB12, FOLATE, FERRITIN, TIBC, IRON, RETICCTPCT in the last 72 hours. Urinalysis    Component Value Date/Time   COLORURINE YELLOW 02/07/2018 1830   APPEARANCEUR CLOUDY (A) 02/07/2018 1830   LABSPEC 1.024 02/07/2018 1830   PHURINE 7.0 02/07/2018 1830   GLUCOSEU NEGATIVE 02/07/2018 1830   HGBUR NEGATIVE 02/07/2018 1830   BILIRUBINUR NEGATIVE 02/07/2018 1830   KETONESUR 5 (A) 02/07/2018 1830   PROTEINUR 100 (A) 02/07/2018 1830   UROBILINOGEN 0.2 08/03/2015 1714   NITRITE NEGATIVE 02/07/2018 1830   LEUKOCYTESUR MODERATE (A) 02/07/2018 1830     Sepsis Labs Invalid input(s): PROCALCITONIN,  WBC,  LACTICIDVEN Microbiology No results found for this or any previous visit (from the past 240 hour(s)).     Inpatient Medications:   Scheduled Meds: Continuous Infusions:   Radiological Exams on Admission: Dg Ankle Complete Left  Result Date: 02/07/2018 CLINICAL DATA:  Foot pain.  Quadriplegic. EXAM:  LEFT ANKLE COMPLETE - 3+ VIEW COMPARISON:  None. FINDINGS: Diffuse osteopenia which limits characterization of osseous detail, however, there is no acute or suspicious osseous lesion. No fracture line or displaced fracture fragment. No destructive change to suggest active osteomyelitis. Extensive soft tissue swelling/edema. IMPRESSION: 1. No acute findings. 2. Prominent soft tissue swelling/edema. Electronically Signed   By: Bary RichardStan  Maynard M.D.   On: 02/07/2018 18:40   Dg Foot Complete Left  Result Date: 02/07/2018 CLINICAL DATA:  Pt c/o left ankle/foot pain diffusely x several days, no inj. Pt quadraplegic s/p spinal cord inj in 2005, hx decubitus ulcers w/ "infection" EXAM: LEFT FOOT - COMPLETE 3+ VIEW COMPARISON:  Plain film of the left foot dated 04/26/2011. FINDINGS: Diffuse osteopenia which limits characterization of osseous detail but no acute or suspicious osseous lesion is identified. No destructive change to suggest active osteomyelitis. No fracture line or displaced fracture fragment identified. Adjacent soft tissues are unremarkable. IMPRESSION: 1. Osteopenia. 2. No acute findings. Electronically Signed   By: Bary RichardStan  Maynard M.D.   On: 02/07/2018 18:35    Impression/Recommendations Active Problems:   * No active hospital problems. *  ##) Hypotension: Likely autonomic dysreflexia from her spinal cord injury.  No clear evidence of any source of infection.  While she does have lower extremity edema there is no evidence of other cellulitis in the left or right ankle.  Her white count is not elevated.  On review of the chart she is long had a history of systolic blood pressures in the 80s ranging to the 140s often during the same visit.  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  She appears well.  There is no evidence of endorgan damage.  Suspect  that this is again likely simply hyperhidrosis. -Consider anticholinergics such as oxybutynin or Myrbetriq to treat  ##) Left ankle pain: Suspect likely related to significant amounts of edema.  Neurovascularly intact.  She does have mottling of the skin this is likely consistent with her autonomic dysfunction.  There is no evidence of DVT. -Consider MRI as an outpatient -Discussed with patient about following up with orthopedist for further evaluation - Consider as needed doses of furosemide 20 mg to help with edema  ##) Infected sacral decubitus ulcer/osteomyelitis: -Continue ceftriaxone via PICC line -Continue oral metronidazole - Currently to keep this ulcers appear to be well-healed and improving from pr  Thank you for this consultation.  At this time  the patient does not appear to have sepsis.  Her hypotension is related to her autonomic dysfunction.  Her hyperhidrosis does not appear to be a reflection of developing infection.  Her ankle pain does not show any evidence of cellulitis at this time.    Time Spent: 5  Delaine Lame M.D. [facility] 02/07/2018, 10:57 PM

## 2018-02-07 NOTE — ED Notes (Signed)
Bed: WA17 Expected date:  Expected time:  Means of arrival:  Comments: 32 yo chills, foot pain

## 2018-02-07 NOTE — ED Provider Notes (Signed)
[place] DEPT Provider Note   CSN: [id number] Arrival date & time: 02/07/18  1720     History   Chief Complaint Chief Complaint  Patient presents with  . Fever    HPI [name] is a 32 y.o. female past medical history of spinal cord injury, asthma, scoliosis, quadriplegia who presents for evaluation of intermittent chills, diaphoresis and progressively worsening left foot pain.  Patient was recently admitted on 01/19/18 for infection related to stage II decubitus ulcers.  At that time, she was admitted for control of infection and had a PICC line inserted and was started on IV antibiotics.  Patient was discharged at the beginning of February.  Patient reports that since then, she has had intermittent sweating, chills.  She states that she has not had any fever.  Additionally she reports worsening left foot pain.  She denies any preceding trauma, injury, fall.  Patient reports that she takes Dilaudid at home and it has not been able to control her left foot pain.  She has swelling at baseline and denies any overlying warmth, erythema.  She has daily changes of her wound by home health nurses.  She reports that wounds have been healing appropriately.  Patient denies any abdominal pain, chest pain, difficulty breathing, nausea/vomiting.  The history is provided by the patient.    Past Medical History:  Diagnosis Date  . Abnormal Pap smear   . Anemia   . ASCUS (atypical squamous cells of undetermined significance) on Pap smear 10/2011  . Asthma Mild, only with exposure to cigarette smoke  . Autonomic dysreflexia   . Chronic pain 2005  . Congenital absence of right kidney   . Decubitus ulcer of buttock, stage 2    bilateral-nondraining-redressed q other day  . Decubitus ulcer of coccygeal region, stage 1   . H/O scoliosis   . Horseshoe kidney   . Hx: UTI (urinary tract infection) 11/27/11  . Quadriplegia following spinal cord injury ([place])   .  Recurrent nephrolithiasis    s/p stent  . Recurrent UTI requires self catheterization  . Sciatica   . Spinal cord injury, cervical region ([place]) 05/19/2004   movement,sensation intact-unable to stand/ transfer since 2017 after septic shock episode per pt  . Urinary tract infection 03/2017    Patient Active Problem List   Diagnosis Date Noted  . Recurrent UTI   . Quadriplegia following spinal cord injury ([place])   . Wound infection 01/19/2018  . Microcytic hypochromic anemia 01/19/2018  . Sepsis ([place]) 01/19/2018  . Pressure injury of skin 01/19/2018  . Infected decubitus ulcer, unspecified pressure ulcer stage   . Oligomenorrhea 05/19/2012  . Spinal cord injury, cervical region [facility]) 05/19/2004    Past Surgical History:  Procedure Laterality Date  . BACK SURGERY  Scoliosis, Harrington Rods in place  . Bilateral leg surgery    . C3-6 spine surgery with rods    . dvt filter placement  2005  . ESOPHAGOGASTRODUODENOSCOPY (EGD) WITH PROPOFOL N/A 01/07/2018   Procedure: ESOPHAGOGASTRODUODENOSCOPY (EGD) WITH PROPOFOL;  Surgeon: Otis Brace, MD;  Location: [facility];  Service: Gastroenterology;  Laterality: N/A;  . INSERTION OF SUPRAPUBIC CATHETER N/A 05/15/2017   Procedure: INSERTION OF SUPRAPUBIC CATHETER;  Surgeon: Kathie Rhodes, MD;  Location: [place];  Service: Urology;  Laterality: N/A;  . LITHOTRIPSY    . ORTHOPEDIC SURGERY  Multiple lower extremity surgeries as a child  . VASCULAR SURGERY  IVC filter placed in 2005  OB History    Gravida Para Term Preterm AB Living   0             SAB TAB Ectopic Multiple Live Births                   Home Medications    Prior to Admission medications   Medication Sig Start Date End Date Taking? Authorizing Provider  albuterol (PROVENTIL HFA;VENTOLIN HFA) 108 (90 BASE) MCG/ACT inhaler Inhale 2 puffs into the lungs every 4 (four) hours as needed for wheezing or shortness of breath.     [provider]    benzonatate (TESSALON) 100 MG capsule Take 1 capsule (100 mg total) by mouth every 8 (eight) hours. Patient not taking: Reported on 12/30/2017 08/22/17   Delia Heady, PA-C  cefTRIAXone (ROCEPHIN) IVPB Inject 1 g into the vein daily for 27 days. Indication:  Sacral decubitus ulcer w/ osteo of R greater trochanter Last Day of Therapy:  02/18/2018 Labs - Once weekly:  CBC/D and BMP, Labs - Every other week:  ESR and CRP 01/22/18 02/18/18  Donne Hazel, MD  diazepam (VALIUM) 5 MG tablet Take 10 mg by mouth every 12 (twelve) hours. 12/03/16   [provider]  HYDROmorphone (DILAUDID) 4 MG tablet Take 4 mg by mouth every 6 (six) hours.     [provider]  ibuprofen (ADVIL,MOTRIN) 200 MG tablet Take 200 mg by mouth daily as needed for moderate pain.    [provider]  metroNIDAZOLE (FLAGYL) 500 MG tablet Take 1 tablet (500 mg total) by mouth 3 (three) times daily for 27 days. 01/22/18 02/18/18  Donne Hazel, MD  ondansetron (ZOFRAN ODT) 4 MG disintegrating tablet Take 1 tablet (4 mg total) by mouth every 8 (eight) hours as needed for nausea or vomiting. Patient not taking: Reported on 12/30/2017 08/22/17   Delia Heady, PA-C  pantoprazole (PROTONIX) 20 MG tablet Take 20 mg by mouth daily after breakfast.     [provider]  pregabalin (LYRICA) 75 MG capsule Take 75 mg by mouth 3 (three) times daily.    [provider]  promethazine (PHENERGAN) 25 MG tablet Take 1 tablet (25 mg total) by mouth every 8 (eight) hours as needed for nausea or vomiting. Patient not taking: Reported on 12/30/2017 09/16/17   Domenic Moras, PA-C  solifenacin (VESICARE) 10 MG tablet Take 10 mg by mouth every morning.     [provider]  norethindrone (MICRONOR,CAMILA,ERRIN) 0.35 MG tablet Take 1 tablet by mouth daily.  03/10/12  [provider]    Family History Family History  Problem Relation Age of Onset  . Heart disease Mother        Massive MI in 28-Feb-2005 ; died     Social History Social History   Tobacco Use  . Smoking status: Never Smoker  . Smokeless tobacco: Never Used  Substance Use Topics  . Alcohol use: No  . Drug use: No     Allergies   Metoclopramide hcl; Benadryl [diphenhydramine hcl]; Gabapentin; Morphine; Vancomycin; and Zosyn [piperacillin-tazobactam in dex]   Review of Systems Review of Systems  Constitutional: Positive for chills and diaphoresis. Negative for fever.  Respiratory: Negative for cough and shortness of breath.   Cardiovascular: Negative for chest pain.  Gastrointestinal: Negative for abdominal pain, diarrhea, nausea and vomiting.  Musculoskeletal:       Left foot pain  Skin: Negative for color change.  Neurological: Negative for dizziness, weakness, numbness and headaches.  Physical Exam Updated Vital Signs BP (!) 96/54 (BP Location: Left Arm)   Pulse 87   Resp 18   Ht 4' 9"  (1.448 m)   Wt 38.6 kg (85 lb)   LMP  (LMP Unknown)   SpO2 100%   BMI 18.39 kg/m   Physical Exam  Constitutional: She is oriented to person, place, and time. She appears well-developed and well-nourished.  HENT:  Head: Normocephalic and atraumatic.  Mouth/Throat: Oropharynx is clear and moist and mucous membranes are normal.  Eyes: Conjunctivae, EOM and lids are normal. Pupils are equal, round, and reactive to light.  Neck: Full passive range of motion without pain.  Cardiovascular: Normal rate, regular rhythm, normal heart sounds and normal pulses. Exam reveals no gallop and no friction rub.  No murmur heard. Pulses:      Dorsalis pedis pulses are 2+ on the right side, and 2+ on the left side.  Pulmonary/Chest: Effort normal and breath sounds normal.  No evidence of respiratory distress. Able to speak in full sentences without difficulty.  Abdominal: Soft. Normal appearance. There is no tenderness. There is no rigidity and no guarding.  Musculoskeletal:  Tenderness palpation overlying the dorsal aspect of the left  ankle that extends to the lateral malleolus.  No deformity noted.  2+ pitting edema noted to the dorsal aspect of bilateral feet.  No overlying warmth, erythema.  Neurological: She is alert and oriented to person, place, and time. She displays atrophy. A sensory deficit is present. She exhibits abnormal muscle tone.  Skin: Skin is warm and dry. Capillary refill takes less than 2 seconds.  Stage IV decubitus and sacral ulcers noted. There is some slight drainage but otherwise no surrounding warmth, erythema.   Psychiatric: She has a normal mood and affect. Her speech is normal.  Nursing note and vitals reviewed.    ED Treatments / Results  Labs (all labs ordered are listed, but only abnormal results are displayed) Labs Reviewed - No data to display  EKG  EKG Interpretation None       Radiology No results found.  Procedures Procedures (including critical care time)  Medications Ordered in ED Medications - No data to display   Initial Impression / Assessment and Plan / ED Course  I have reviewed the triage vital signs and the nursing notes.  Pertinent labs & imaging results that were available during my care of the patient were reviewed by me and considered in my medical decision making (see chart for details).       32 year old female with past medical history of spinal cord injury, decubitus ulcers who presents for evaluation of intermittent diaphoresis, chills and left foot pain that is been worsening over the last 2 weeks.  Recently admitted for infection of sacral wounds where she had a PICC line inserted and is started on IV antibiotics.  Has home health care at home who has been changing wounds frequently and been giving antibiotics.  No chest pain, difficulty breathing, abdominal pain, nausea/vomiting. Patient is afebrile, non-toxic appearing, sitting comfortably on examination table.  Vital signs reviewed.  Patient is slightly hypotensive, appears to be her baseline.   Exam, patient does have tenderness to the dorsal aspect of the left foot.  There is overlying soft tissue swelling but appears to be consistent with baseline.  There is some redness with possible skin breakdown noted to the posterior aspect.  Plan to check basic labs, foot XR.   Informed me that patient was profusely diaphoretic and was  having chills.  No fever noted.  Blood pressure was still slightly hypotensive with fluids running.   I-STAT lactic acid negative.  I-STAT beta negative.  CBC shows anemia but actually appears improved from previous. UA shows leukocytes, too numerous to count white blood cells concerning for UTI.  Patient is have a suprapubic catheter in place.  Left ankle is negative for any acute abnormalities.  Left foot is negative for any acute abnormalities.  RN informed me patient's blood pressure increased to 147 systolics.  Unsure if this is accurate as patient is tremulous in the ED.  Unable to obtain manual blood pressure.   Reevaluation after analgesics.  Patient's blood pressures improved after analgesics.  I discussed with Dr. Lita Mains who independently evaluated patient.  Given concerns about urinary tract infection, skin breakdown the left foot, persistent drop in blood pressure, will likely need admission for observation.  Discussed patient with hospitalist. Will come to ED for evaluation.   Discussed patient with Dr. Herbert Moors after ED evaluation. Does not feel that admission is warranted in this instance as blood pressure are consistent with patient's baseline. Additionally, no evidence of cellulitis that would need further antibiotic treatment during admission. Recommends continuing outpatient antibiotics, increasing patient's pain medication and follow-up with PCP.   Discussed patient with Dr. Lita Mains. Agrees with plan to discharge home after hospitalist evaluation.  Discussed plan with patient.  Instructed her to continue doing IV antibiotics through her PICC  line to treat her sacral wounds.  She has an appointment with her primary care doctor tomorrow for evaluation.  We will plan to give her a short course of increased pain medication to help with pain until she is being able to seen by her primary care doctor.  Vital signs stable. Patient had ample opportunity for questions and discussion. All patient's questions were answered with full understanding. Strict return precautions discussed. Patient expresses understanding and agreement to plan.        Final Clinical Impressions(s) / ED Diagnoses   Final diagnoses:  Left foot pain    ED Discharge Orders    None       Desma Mcgregor 02/17/18 0239    Julianne Rice, MD 02/19/18 1529

## 2018-02-07 NOTE — ED Triage Notes (Signed)
EMS states pt has had a fever for several weeks today temp 100.5 oral by EMS, Chills, pt has decubitus that are underlying source of fever an infection. 108/50-88 CBG 112

## 2018-02-08 ENCOUNTER — Emergency Department (HOSPITAL_COMMUNITY)
Admission: EM | Admit: 2018-02-08 | Discharge: 2018-02-08 | Disposition: A | Discharge status: Home / Self Care | Payer: Medicare Other | Source: Home / Self Care | Attending: Emergency Medicine | Admitting: Emergency Medicine

## 2018-02-08 ENCOUNTER — Encounter (HOSPITAL_COMMUNITY): Payer: Self-pay | Admitting: Emergency Medicine

## 2018-02-08 DIAGNOSIS — J45909 Unspecified asthma, uncomplicated: Secondary | ICD-10-CM | POA: Insufficient documentation

## 2018-02-08 DIAGNOSIS — R6883 Chills (without fever): Secondary | ICD-10-CM | POA: Insufficient documentation

## 2018-02-08 DIAGNOSIS — G43909 Migraine, unspecified, not intractable, without status migrainosus: Secondary | ICD-10-CM | POA: Insufficient documentation

## 2018-02-08 DIAGNOSIS — R112 Nausea with vomiting, unspecified: Secondary | ICD-10-CM

## 2018-02-08 DIAGNOSIS — G43009 Migraine without aura, not intractable, without status migrainosus: Secondary | ICD-10-CM

## 2018-02-08 DIAGNOSIS — M79672 Pain in left foot: Secondary | ICD-10-CM | POA: Diagnosis not present

## 2018-02-08 LAB — CBC WITH DIFFERENTIAL/PLATELET
Basophils Absolute: 0.1 10*3/uL (ref 0.0–0.1)
Basophils Relative: 1 %
EOS PCT: 6 %
Eosinophils Absolute: 0.4 10*3/uL (ref 0.0–0.7)
HCT: 31 % — ABNORMAL LOW (ref 36.0–46.0)
Hemoglobin: 9.2 g/dL — ABNORMAL LOW (ref 12.0–15.0)
Lymphocytes Relative: 37 %
Lymphs Abs: 2.4 10*3/uL (ref 0.7–4.0)
MCH: 22.2 pg — AB (ref 26.0–34.0)
MCHC: 29.7 g/dL — ABNORMAL LOW (ref 30.0–36.0)
MCV: 74.7 fL — AB (ref 78.0–100.0)
MONO ABS: 0.4 10*3/uL (ref 0.1–1.0)
Monocytes Relative: 6 %
NEUTROS ABS: 3.1 10*3/uL (ref 1.7–7.7)
Neutrophils Relative %: 50 %
PLATELETS: 254 10*3/uL (ref 150–400)
RBC: 4.15 MIL/uL (ref 3.87–5.11)
RDW: 24.1 % — AB (ref 11.5–15.5)
WBC: 6.4 10*3/uL (ref 4.0–10.5)

## 2018-02-08 LAB — COMPREHENSIVE METABOLIC PANEL
ALT: 9 U/L — AB (ref 14–54)
AST: 17 U/L (ref 15–41)
Albumin: 2.9 g/dL — ABNORMAL LOW (ref 3.5–5.0)
Alkaline Phosphatase: 50 U/L (ref 38–126)
Anion gap: 9 (ref 5–15)
BUN: 5 mg/dL — AB (ref 6–20)
CHLORIDE: 103 mmol/L (ref 101–111)
CO2: 25 mmol/L (ref 22–32)
CREATININE: 0.35 mg/dL — AB (ref 0.44–1.00)
Calcium: 8.3 mg/dL — ABNORMAL LOW (ref 8.9–10.3)
GFR calc Af Amer: >60 mL/min (ref >60)
GFR calc non Af Amer: >60 mL/min (ref >60)
GLUCOSE: 83 mg/dL (ref 65–99)
Potassium: 4 mmol/L (ref 3.5–5.1)
SODIUM: 137 mmol/L (ref 135–145)
Total Bilirubin: 0.5 mg/dL (ref 0.3–1.2)
Total Protein: 7.3 g/dL (ref 6.5–8.1)

## 2018-02-08 LAB — I-STAT CG4 LACTIC ACID, ED: Lactic Acid, Venous: 0.67 mmol/L (ref 0.5–1.9)

## 2018-02-08 LAB — URINALYSIS, ROUTINE W REFLEX MICROSCOPIC
Bilirubin Urine: NEGATIVE
GLUCOSE, UA: NEGATIVE mg/dL
HGB URINE DIPSTICK: NEGATIVE
Ketones, ur: NEGATIVE mg/dL
Leukocytes, UA: NEGATIVE
Nitrite: NEGATIVE
PROTEIN: NEGATIVE mg/dL
Specific Gravity, Urine: 1.003 — ABNORMAL LOW (ref 1.005–1.030)
pH: 7 (ref 5.0–8.0)

## 2018-02-08 MED ORDER — ONDANSETRON HCL 4 MG/2ML IJ SOLN
4.0000 mg | Freq: Once | INTRAMUSCULAR | Status: AC
  Administered 2018-02-08: 4 mg via INTRAVENOUS
  Filled 2018-02-08: qty 2

## 2018-02-08 MED ORDER — PROMETHAZINE HCL 25 MG/ML IJ SOLN
12.5000 mg | Freq: Once | INTRAMUSCULAR | Status: AC
  Administered 2018-02-08: 12.5 mg via INTRAVENOUS
  Filled 2018-02-08: qty 1

## 2018-02-08 MED ORDER — HYDROMORPHONE HCL 1 MG/ML IJ SOLN
1.0000 mg | Freq: Once | INTRAMUSCULAR | Status: AC
  Administered 2018-02-08: 1 mg via INTRAVENOUS
  Filled 2018-02-08: qty 1

## 2018-02-08 MED ORDER — HEPARIN SOD (PORK) LOCK FLUSH 100 UNIT/ML IV SOLN
500.0000 [IU] | Freq: Once | INTRAVENOUS | Status: AC
  Administered 2018-02-08: 500 [IU]
  Filled 2018-02-08: qty 5

## 2018-02-08 MED ORDER — SODIUM CHLORIDE 0.9 % IV BOLUS (SEPSIS)
500.0000 mL | Freq: Once | INTRAVENOUS | Status: AC
  Administered 2018-02-08: 500 mL via INTRAVENOUS

## 2018-02-08 MED ORDER — KETOROLAC TROMETHAMINE 30 MG/ML IJ SOLN
30.0000 mg | Freq: Once | INTRAMUSCULAR | Status: AC
  Administered 2018-02-08: 30 mg via INTRAVENOUS
  Filled 2018-02-08: qty 1

## 2018-02-08 NOTE — ED Notes (Signed)
Changed bedside suprapubic drain bag. Urine sample collected

## 2018-02-08 NOTE — ED Provider Notes (Signed)
Emergency Department Provider Note   I have reviewed the triage vital signs and the nursing notes.   HISTORY  Chief Complaint Headache   HPI [name] is a 32 y.o. female with PMH of cervical spine injury with quadriplegia, suprapubic catheter, and osteomyelitis with PICC line in place for IV Rocephin and PO Flagyl since to the emergency department for evaluation of sweating, chills, blurry vision, headache.  Patient was evaluated in the emergency department yesterday by both the ED provider and hospitalist.  She was discharged suspecting that the above symptoms were related to her spinal cord injury and not related to new infection.  She is currently on Rocephin and Flagyl.  In addition to the diaphoresis and chills she is developed some blurry vision and headache typical of her migraine headaches.  She has been taking her home medications but has experienced some nausea and vomiting.    Past Medical History:  Diagnosis Date  . Abnormal Pap smear   . Anemia   . ASCUS (atypical squamous cells of undetermined significance) on Pap smear 10/2011  . Asthma Mild, only with exposure to cigarette smoke  . Autonomic dysreflexia   . Chronic pain 2005  . Congenital absence of right kidney   . Decubitus ulcer of buttock, stage 2    bilateral-nondraining-redressed q other day  . Decubitus ulcer of coccygeal region, stage 1   . H/O scoliosis   . Horseshoe kidney   . Hx: UTI (urinary tract infection) 11/27/11  . Quadriplegia following spinal cord injury (HCC)   . Recurrent nephrolithiasis    s/p stent  . Recurrent UTI requires self catheterization  . Sciatica   . Spinal cord injury, cervical region (HCC) 05/19/2004   movement,sensation intact-unable to stand/ transfer since 2017 after septic shock episode per pt  . Urinary tract infection 03/2017    Patient Active Problem List   Diagnosis Date Noted  . Recurrent UTI   . Quadriplegia following spinal cord injury (HCC)   .  Wound infection 01/19/2018  . Microcytic hypochromic anemia 01/19/2018  . Sepsis (HCC) 01/19/2018  . Pressure injury of skin 01/19/2018  . Infected decubitus ulcer, unspecified pressure ulcer stage   . Oligomenorrhea 05/19/2012  . Spinal cord injury, cervical region [facility]) 05/19/2004    Past Surgical History:  Procedure Laterality Date  . BACK SURGERY  Scoliosis, Harrington Rods in place  . Bilateral leg surgery    . C3-6 spine surgery with rods    . dvt filter placement  2005  . ESOPHAGOGASTRODUODENOSCOPY (EGD) WITH PROPOFOL N/A 01/07/2018   Procedure: ESOPHAGOGASTRODUODENOSCOPY (EGD) WITH PROPOFOL;  Surgeon: Kathi Der, MD;  Location: [facility];  Service: Gastroenterology;  Laterality: N/A;  . INSERTION OF SUPRAPUBIC CATHETER N/A 05/15/2017   Procedure: INSERTION OF SUPRAPUBIC CATHETER;  Surgeon: Ihor Gully, MD;  Location: [facility];  Service: Urology;  Laterality: N/A;  . LITHOTRIPSY    . ORTHOPEDIC SURGERY  Multiple lower extremity surgeries as a child  . VASCULAR SURGERY  IVC filter placed in 2005    Current Outpatient Rx  . Order #: [id number] Class: Historical Med  . Order #: [id number] Class: Print  . Order #: [id number] Class: Historical Med  . Order #: [id number] Class: Print  . Order #: [id number] Class: Historical Med  . Order #: [id number] Class: Historical Med  . Order #: [id number] Class: Historical Med    Allergies Metoclopramide hcl; Benadryl [diphenhydramine hcl]; Gabapentin; Morphine; Vancomycin; and Zosyn [piperacillin-tazobactam in dex]  Family History  Problem Relation Age of Onset  .  Heart disease Mother        Massive MI in 2006 ; died    Social History Social History   Tobacco Use  . Smoking status: Never Smoker  . Smokeless tobacco: Never Used  Substance Use Topics  . Alcohol use: No  . Drug use: No    Review of Systems  Constitutional: No fever/chills Eyes: No visual changes. ENT: No sore throat. Cardiovascular: Denies  chest pain. Respiratory: Denies shortness of breath. Gastrointestinal: No abdominal pain. Positive nausea and vomiting.  No diarrhea.  No constipation. Genitourinary: Negative for dysuria. Musculoskeletal: Negative for back pain. Skin: Negative for rash. Neurological: Negative for focal weakness or numbness. Positive HA.    10-point ROS otherwise negative.  ____________________________________________   PHYSICAL EXAM:  VITAL SIGNS: ED Triage Vitals [02/08/18 1247]  Enc Vitals Group     BP (!) 161/114     Pulse Rate 90     Resp 14     Temp 98.4 F (36.9 C)     Temp Source Oral     SpO2 99 %   Constitutional: Alert and oriented. Chronically ill-appearing.  Eyes: Conjunctivae are normal. Head: Atraumatic. Nose: No congestion/rhinnorhea. Mouth/Throat: Mucous membranes are moist.  Neck: No stridor.  Cardiovascular: Normal rate, regular rhythm. Good peripheral circulation. Grossly normal heart sounds.   Respiratory: Normal respiratory effort.  No retractions. Lungs CTAB. Gastrointestinal: Soft and nontender. No distention. Suprapubic catheter in place.  Musculoskeletal: No lower extremity tenderness nor edema. No gross deformities of extremities. Neurologic:  Normal speech and language. Baseline quadriplegia.  Skin:  Skin is warm, dry and intact. No rash noted.  ____________________________________________   LABS (all labs ordered are listed, but only abnormal results are displayed)  Labs Reviewed  COMPREHENSIVE METABOLIC PANEL - Abnormal; Notable for the following components:      Result Value   BUN 5 (*)    Creatinine, Ser 0.35 (*)    Calcium 8.3 (*)    Albumin 2.9 (*)    ALT 9 (*)    All other components within normal limits  CBC WITH DIFFERENTIAL/PLATELET - Abnormal; Notable for the following components:   Hemoglobin 9.2 (*)    HCT 31.0 (*)    MCV 74.7 (*)    MCH 22.2 (*)    MCHC 29.7 (*)    RDW 24.1 (*)    All other components within normal limits    URINALYSIS, ROUTINE W REFLEX MICROSCOPIC - Abnormal; Notable for the following components:   Color, Urine COLORLESS (*)    Specific Gravity, Urine 1.003 (*)    All other components within normal limits  URINE CULTURE  I-STAT CG4 LACTIC ACID, ED   ____________________________________________  RADIOLOGY  None ____________________________________________   PROCEDURES  Procedure(s) performed:   Procedures  None ____________________________________________   INITIAL IMPRESSION / ASSESSMENT AND PLAN / ED COURSE  Pertinent labs & imaging results that were available during my care of the patient were reviewed by me and considered in my medical decision making (see chart for details).  Patient presents to the ED with nausea, vomiting, and typical migraine HA. No concern for infectious etiology of HA. Symptoms of diaphoresis and shaking appear to be more autonomic in quality. Will screen for sepsis and reassess after meds for symptoms mgmt.   04:09 PM Patient patient is feeling slightly better.  No more nausea.  Continue to have some ongoing pain which is chronic.  No evidence of infection or sepsis on labs today.  Urine culture pending.  Plan for follow-up with outpatient neurology regarding autonomic symptoms. Plan for continued IV and PO abx at home.   At this time, I do not feel there is any life-threatening condition present. I have reviewed and discussed all results (EKG, imaging, lab, urine as appropriate), exam findings with patient. I have reviewed nursing notes and appropriate previous records.  I feel the patient is safe to be discharged home without further emergent workup. Discussed usual and customary return precautions. Patient and family (if present) verbalize understanding and are comfortable with this plan.  Patient will follow-up with their primary care provider. If they do not have a primary care provider, information for follow-up has been provided to them. All  questions have been answered.  ____________________________________________  FINAL CLINICAL IMPRESSION(S) / ED DIAGNOSES  Final diagnoses:  Migraine without aura and without status migrainosus, not intractable  Non-intractable vomiting with nausea, unspecified vomiting type     MEDICATIONS GIVEN DURING THIS VISIT:  Medications  sodium chloride 0.9 % bolus 500 mL (0 mLs Intravenous Stopped 02/08/18 1537)  ketorolac (TORADOL) 30 MG/ML injection 30 mg (30 mg Intravenous Given 02/08/18 1407)  ondansetron (ZOFRAN) injection 4 mg (4 mg Intravenous Given 02/08/18 1407)  HYDROmorphone (DILAUDID) injection 1 mg (1 mg Intravenous Given 02/08/18 1407)  promethazine (PHENERGAN) injection 12.5 mg (12.5 mg Intravenous Given 02/08/18 1505)  HYDROmorphone (DILAUDID) injection 1 mg (1 mg Intravenous Given 02/08/18 1635)    Note:  This document was prepared using Dragon voice recognition software and may include unintentional dictation errors.  Alona Bene, MD Emergency Medicine    Long, Arlyss Repress, MD 02/08/18 [phone]

## 2018-02-08 NOTE — ED Notes (Signed)
[facility] contacted for discharge transport 

## 2018-02-08 NOTE — ED Notes (Signed)
Signature pad not working at this time 

## 2018-02-08 NOTE — Discharge Instructions (Signed)
You have been seen in the Emergency Department (ED) today for nausea and vomiting.  Your work up today has not shown a clear cause for your symptoms. Please use your home medications as prescribed as needed for your nausea and pain.   Follow up with your doctor as soon as possible regarding today?s emergent visit and your symptoms of nausea.   Return to the ED if you develop abdominal, bloody vomiting, bloody diarrhea, if you are unable to tolerate fluids due to vomiting, or if you develop other symptoms that concern you.

## 2018-02-08 NOTE — ED Triage Notes (Signed)
Per [facility]- pt here with hx of spinal chord injury. Pt had sudden onset migraine with bilateral blurred vision starting about 1 hour ago. Pt has autonomic dysreflexia. States this has happened before. Pt was just seen at [facility] for a UTI, also has a pressure ulcer and being treated by home health with IV antibiotics.

## 2018-02-10 LAB — URINE CULTURE: SPECIAL REQUESTS: NORMAL

## 2018-02-11 ENCOUNTER — Telehealth: Payer: Self-pay | Admitting: Emergency Medicine

## 2018-02-11 NOTE — Telephone Encounter (Signed)
Post ED Visit - Positive Culture Follow-up  Culture report reviewed by antimicrobial stewardship pharmacist:  []  Enzo BiNathan Batchelder, Pharm.D. []  Celedonio MiyamotoJeremy Frens, [address]Pharm.D., BCPS AQ-ID []  Garvin FilaMike Maccia, Pharm.D., BCPS [x]  Georgina PillionElizabeth Martin, Pharm.D., BCPS []  ExelandMinh Pham, [address]Pharm[address]D., BCPS, AAHIVP []  Estella HuskMichelle Turner, Pharm.D., BCPS, AAHIVP []  Lysle Pearlachel Rumbarger, PharmD, BCPS []  Blake DivineShannon Parkey, PharmD []  Pollyann SamplesAndy Johnston, PharmD, BCPS  Positive urine culture Treated with none, likely colonized, no further patient follow-up is required at this time.  Berle MullMiller, Racer Quam 02/11/2018, 9:40 AM

## 2018-03-16 ENCOUNTER — Inpatient Hospital Stay: Payer: Medicare Other | Admitting: Internal Medicine

## 2018-04-05 ENCOUNTER — Emergency Department (HOSPITAL_COMMUNITY)
Admission: EM | Admit: 2018-04-05 | Discharge: 2018-04-05 | Disposition: A | Discharge status: Home / Self Care | Payer: Medicare Other | Attending: Emergency Medicine | Admitting: Emergency Medicine

## 2018-04-05 ENCOUNTER — Encounter (HOSPITAL_COMMUNITY): Payer: Self-pay | Admitting: Family Medicine

## 2018-04-05 DIAGNOSIS — Y732 Prosthetic and other implants, materials and accessory gastroenterology and urology devices associated with adverse incidents: Secondary | ICD-10-CM | POA: Insufficient documentation

## 2018-04-05 DIAGNOSIS — T83511A Infection and inflammatory reaction due to indwelling urethral catheter, initial encounter: Secondary | ICD-10-CM | POA: Insufficient documentation

## 2018-04-05 DIAGNOSIS — N390 Urinary tract infection, site not specified: Secondary | ICD-10-CM | POA: Diagnosis not present

## 2018-04-05 DIAGNOSIS — Z79899 Other long term (current) drug therapy: Secondary | ICD-10-CM | POA: Insufficient documentation

## 2018-04-05 DIAGNOSIS — J45909 Unspecified asthma, uncomplicated: Secondary | ICD-10-CM | POA: Diagnosis not present

## 2018-04-05 LAB — COMPREHENSIVE METABOLIC PANEL
ALBUMIN: 3.5 g/dL (ref 3.5–5.0)
ALT: 9 U/L — ABNORMAL LOW (ref 14–54)
AST: 13 U/L — AB (ref 15–41)
Alkaline Phosphatase: 56 U/L (ref 38–126)
Anion gap: 9 (ref 5–15)
BUN: 12 mg/dL (ref 6–20)
CHLORIDE: 105 mmol/L (ref 101–111)
CO2: 27 mmol/L (ref 22–32)
Calcium: 8.9 mg/dL (ref 8.9–10.3)
Creatinine, Ser: 0.45 mg/dL (ref 0.44–1.00)
GFR calc Af Amer: >60 mL/min (ref >60)
GFR calc non Af Amer: >60 mL/min (ref >60)
GLUCOSE: 87 mg/dL (ref 65–99)
POTASSIUM: 3.9 mmol/L (ref 3.5–5.1)
Sodium: 141 mmol/L (ref 135–145)
Total Bilirubin: 0.3 mg/dL (ref 0.3–1.2)
Total Protein: 7.8 g/dL (ref 6.5–8.1)

## 2018-04-05 LAB — URINALYSIS, ROUTINE W REFLEX MICROSCOPIC
BILIRUBIN URINE: NEGATIVE
Glucose, UA: NEGATIVE mg/dL
HGB URINE DIPSTICK: NEGATIVE
Ketones, ur: NEGATIVE mg/dL
Nitrite: POSITIVE — AB
PROTEIN: 100 mg/dL — AB
Specific Gravity, Urine: 1.017 (ref 1.005–1.030)
pH: 7 (ref 5.0–8.0)

## 2018-04-05 LAB — CBC WITH DIFFERENTIAL/PLATELET
BASOS ABS: 0 10*3/uL (ref 0.0–0.1)
Basophils Relative: 0 %
Eosinophils Absolute: 0.1 10*3/uL (ref 0.0–0.7)
Eosinophils Relative: 2 %
HCT: 31.5 % — ABNORMAL LOW (ref 36.0–46.0)
Hemoglobin: 9.4 g/dL — ABNORMAL LOW (ref 12.0–15.0)
LYMPHS ABS: 1.6 10*3/uL (ref 0.7–4.0)
Lymphocytes Relative: 22 %
MCH: 22.3 pg — ABNORMAL LOW (ref 26.0–34.0)
MCHC: 29.8 g/dL — ABNORMAL LOW (ref 30.0–36.0)
MCV: 74.6 fL — ABNORMAL LOW (ref 78.0–100.0)
MONO ABS: 0.6 10*3/uL (ref 0.1–1.0)
MONOS PCT: 8 %
Neutro Abs: 4.9 10*3/uL (ref 1.7–7.7)
Neutrophils Relative %: 68 %
PLATELETS: 216 10*3/uL (ref 150–400)
RBC: 4.22 MIL/uL (ref 3.87–5.11)
RDW: 16.5 % — AB (ref 11.5–15.5)
WBC: 7.2 10*3/uL (ref 4.0–10.5)

## 2018-04-05 MED ORDER — HYDROMORPHONE HCL 1 MG/ML IJ SOLN
1.0000 mg | Freq: Once | INTRAMUSCULAR | Status: AC
  Administered 2018-04-05: 1 mg via INTRAVENOUS
  Filled 2018-04-05: qty 1

## 2018-04-05 MED ORDER — FENTANYL CITRATE (PF) 100 MCG/2ML IJ SOLN
50.0000 ug | Freq: Once | INTRAMUSCULAR | Status: AC
  Administered 2018-04-05: 50 ug via INTRAVENOUS
  Filled 2018-04-05: qty 2

## 2018-04-05 MED ORDER — ONDANSETRON HCL 4 MG/2ML IJ SOLN: 4.0000 mg | Freq: Once | INTRAMUSCULAR | Status: DC

## 2018-04-05 MED ORDER — FENTANYL CITRATE (PF) 100 MCG/2ML IJ SOLN
50.0000 ug | Freq: Once | INTRAMUSCULAR | Status: AC
Start: 2018-04-05 — End: 2018-04-05
  Administered 2018-04-05: 50 ug via INTRAVENOUS
  Filled 2018-04-05: qty 2

## 2018-04-05 MED ORDER — ONDANSETRON HCL 4 MG/2ML IJ SOLN
4.0000 mg | Freq: Once | INTRAMUSCULAR | Status: AC
  Administered 2018-04-05: 4 mg via INTRAVENOUS
  Filled 2018-04-05: qty 2

## 2018-04-05 MED ORDER — SODIUM CHLORIDE 0.9 % IV BOLUS
1000.0000 mL | Freq: Once | INTRAVENOUS | Status: AC
  Administered 2018-04-05: 1000 mL via INTRAVENOUS

## 2018-04-05 MED ORDER — CEPHALEXIN 500 MG PO CAPS: 500.0000 mg | ORAL_CAPSULE | Freq: Four times a day (QID) | ORAL | 0 refills | Status: DC

## 2018-04-05 MED ORDER — SODIUM CHLORIDE 0.9 % IV SOLN
1.0000 g | Freq: Once | INTRAVENOUS | Status: AC
  Administered 2018-04-05: 1 g via INTRAVENOUS
  Filled 2018-04-05: qty 10

## 2018-04-05 NOTE — ED Provider Notes (Addendum)
[facility]-EMERGENCY DEPT Provider Note   CSN: [id number] Arrival date & time: 04/05/18  1503     History   Chief Complaint Chief Complaint  Patient presents with  . Dizziness  . Urinary Tract Infection    HPI [name] is a 32 y.o. female.  The history is provided by the patient. No language interpreter was used.  Urinary Tract Infection   This is a recurrent problem. The current episode started more than 1 week ago. The problem has been gradually worsening. The pain is moderate. There has been no fever. Pertinent negatives include no hematuria. She has tried nothing for the symptoms. Her past medical history is significant for recurrent UTIs.  Pt has a suprapubic catheter.  Pt reports she has had change in urine since catheter was changed.  Pt is concerned taht she has a uti  Past Medical History:  Diagnosis Date  . Abnormal Pap smear   . Anemia   . ASCUS (atypical squamous cells of undetermined significance) on Pap smear 10/2011  . Asthma Mild, only with exposure to cigarette smoke  . Autonomic dysreflexia   . Chronic pain 2005  . Congenital absence of right kidney   . Decubitus ulcer of buttock, stage 2    bilateral-nondraining-redressed q other day  . Decubitus ulcer of coccygeal region, stage 1   . H/O scoliosis   . Horseshoe kidney   . Hx: UTI (urinary tract infection) 11/27/11  . Quadriplegia following spinal cord injury (HCC)   . Recurrent nephrolithiasis    s/p stent  . Recurrent UTI requires self catheterization  . Sciatica   . Spinal cord injury, cervical region (HCC) 05/19/2004   movement,sensation intact-unable to stand/ transfer since 2017 after septic shock episode per pt  . Urinary tract infection 03/2017    Patient Active Problem List   Diagnosis Date Noted  . Recurrent UTI   . Quadriplegia following spinal cord injury (HCC)   . Wound infection 01/19/2018  . Microcytic hypochromic anemia 01/19/2018  . Sepsis (HCC)  01/19/2018  . Pressure injury of skin 01/19/2018  . Infected decubitus ulcer, unspecified pressure ulcer stage   . Oligomenorrhea 05/19/2012  . Spinal cord injury, cervical region [facility]) 05/19/2004    Past Surgical History:  Procedure Laterality Date  . BACK SURGERY  Scoliosis, Harrington Rods in place  . Bilateral leg surgery    . C3-6 spine surgery with rods    . dvt filter placement  2005  . ESOPHAGOGASTRODUODENOSCOPY (EGD) WITH PROPOFOL N/A 01/07/2018   Procedure: ESOPHAGOGASTRODUODENOSCOPY (EGD) WITH PROPOFOL;  Surgeon: Kathi Der, MD;  Location: [facility];  Service: Gastroenterology;  Laterality: N/A;  . INSERTION OF SUPRAPUBIC CATHETER N/A 05/15/2017   Procedure: INSERTION OF SUPRAPUBIC CATHETER;  Surgeon: Ihor Gully, MD;  Location: [facility];  Service: Urology;  Laterality: N/A;  . LITHOTRIPSY    . ORTHOPEDIC SURGERY  Multiple lower extremity surgeries as a child  . VASCULAR SURGERY  IVC filter placed in 2005     OB History    Gravida  0   Para      Term      Preterm      AB      Living        SAB      TAB      Ectopic      Multiple      Live Births  Home Medications    Prior to Admission medications   Medication Sig Start Date End Date Taking? Authorizing Provider  albuterol (PROVENTIL HFA;VENTOLIN HFA) 108 (90 BASE) MCG/ACT inhaler Inhale 2 puffs into the lungs every 4 (four) hours as needed for wheezing or shortness of breath.    Yes [provider]  HYDROmorphone (DILAUDID) 2 MG tablet Take 1 tablet (2 mg total) by mouth every 8 (eight) hours. 02/07/18  Yes Maxwell Caul, PA-C  pantoprazole (PROTONIX) 20 MG tablet Take 20 mg by mouth daily after breakfast.    Yes [provider]  pregabalin (LYRICA) 75 MG capsule Take 75 mg by mouth 3 (three) times daily.   Yes [provider]  solifenacin (VESICARE) 10 MG tablet Take 10 mg by mouth every morning.    Yes [provider]  tizanidine (ZANAFLEX) 2 MG capsule Take 2 mg by mouth 3 (three) times daily. 03/13/18  Yes [provider]  cephALEXin (KEFLEX) 500 MG capsule Take 1 capsule (500 mg total) by mouth 4 (four) times daily. 04/05/18   Elson Areas, PA-C  norethindrone (MICRONOR,CAMILA,ERRIN) 0.35 MG tablet Take 1 tablet by mouth daily.  03/10/12  [provider]    Family History Family History  Problem Relation Age of Onset  . Heart disease Mother        Massive MI in 05-07-2005 ; died    Social History Social History   Tobacco Use  . Smoking status: Never Smoker  . Smokeless tobacco: Never Used  Substance Use Topics  . Alcohol use: No  . Drug use: No     Allergies   Metoclopramide hcl; Benadryl [diphenhydramine hcl]; Gabapentin; Morphine; Vancomycin; and Zosyn [piperacillin-tazobactam in dex]   Review of Systems Review of Systems  Genitourinary: Negative for hematuria.  All other systems reviewed and are negative.    Physical Exam Updated Vital Signs BP 105/66 (BP Location: Left Arm)   Pulse 66   Temp 98 F (36.7 C) (Oral)   Resp 18   Ht  (1.448 m)   Wt 45.4 kg (100 lb)   LMP 03/24/2018   SpO2 100%   BMI 21.64 kg/m   Physical Exam  Constitutional: She is oriented to person, place, and time. She appears well-developed and well-nourished.  HENT:  Head: Normocephalic.  Right Ear: External ear normal.  Left Ear: External ear normal.  Eyes: Pupils are equal, round, and reactive to light. EOM are normal.  Neck: Normal range of motion.  Cardiovascular: Normal rate and regular rhythm.  Pulmonary/Chest: Effort normal.  Abdominal: Soft. She exhibits no distension.  Musculoskeletal:  quadraplegia  Neurological: She is alert and oriented to person, place, and time.  Skin: Skin is warm.  Psychiatric: She has a normal mood and affect.  Nursing note and vitals reviewed.    ED Treatments / Results  Labs (all labs ordered are listed, but only abnormal results  are displayed) Labs Reviewed  URINALYSIS, ROUTINE W REFLEX MICROSCOPIC - Abnormal; Notable for the following components:      Result Value   APPearance CLOUDY (*)    Protein, ur 100 (*)    Nitrite POSITIVE (*)    Leukocytes, UA LARGE (*)    Bacteria, UA MANY (*)    Squamous Epithelial / LPF 0-5 (*)    Non Squamous Epithelial 0-5 (*)    All other components within normal limits  CBC WITH DIFFERENTIAL/PLATELET - Abnormal; Notable for the following components:   Hemoglobin 9.4 (*)  HCT 31.5 (*)    MCV 74.6 (*)    MCH 22.3 (*)    MCHC 29.8 (*)    RDW 16.5 (*)    All other components within normal limits  COMPREHENSIVE METABOLIC PANEL - Abnormal; Notable for the following components:   AST 13 (*)    ALT 9 (*)    All other components within normal limits  URINE CULTURE    EKG None  Radiology No results found.  Procedures Procedures (including critical care time)  Medications Ordered in ED Medications  sodium chloride 0.9 % bolus 1,000 mL (0 mLs Intravenous Stopped 04/05/18 1805)  ondansetron (ZOFRAN) injection 4 mg (4 mg Intravenous Given 04/05/18 1603)  fentaNYL (SUBLIMAZE) injection 50 mcg (50 mcg Intravenous Given 04/05/18 1701)  cefTRIAXone (ROCEPHIN) 1 g in sodium chloride 0.9 % 100 mL IVPB (0 g Intravenous Stopped 04/05/18 2021)  fentaNYL (SUBLIMAZE) injection 50 mcg (50 mcg Intravenous Given 04/05/18 1851)     Initial Impression / Assessment and Plan / ED Course  I have reviewed the triage vital signs and the nursing notes.  Pertinent labs & imaging results that were available during my care of the patient were reviewed by me and considered in my medical decision making (see chart for details).  Clinical Course as of Apr 05 2200  Mon Apr 05, 2018  2056 CBC with Differential/PlateletMarland Kitchen [LS]    Clinical Course User Index [LS] Osie Cheeks    MDM  RN obtained clean specimen from upper valve.  Bag changed at pt's request. I think urine is a good  specimen.  I will obtain a culture.  Pt given rocephin 1 gram Iv.  Pt has concerns about her medications,  Her MD stopped valium.   Pt advised she will need to discuss regular medications with her MD.  Pt given rx for keflex     Final Clinical Impressions(s) / ED Diagnoses   Final diagnoses:  Urinary tract infection without hematuria, site unspecified    ED Discharge Orders        Ordered    cephALEXin (KEFLEX) 500 MG capsule  4 times daily     04/05/18 2032    An After Visit Summary was printed and given to the patient.    Elson Areas, PA-C 04/05/18 2042    Elson Areas, PA-C 04/05/18 2201    Linwood Dibbles, MD 04/06/18 1501

## 2018-04-05 NOTE — ED Notes (Signed)
Changed the tubing and bag of the suprapubic catheter. It is clamped off to obtain a urine sample and culture.

## 2018-04-05 NOTE — ED Notes (Signed)
[facility] here to transport patient 

## 2018-04-05 NOTE — ED Notes (Signed)
Provided patient graham crackers and peanut butter crackers.

## 2018-04-05 NOTE — ED Notes (Signed)
While starting the infusion for room 23, patient requested pain medication. Informed Clydie BraunKaren, [place]. New orders obtained.

## 2018-04-05 NOTE — ED Notes (Signed)
Bed: WA23 Expected date:  Expected time:  Means of arrival:  Comments: EMS-flank pain 

## 2018-04-05 NOTE — ED Notes (Signed)
Notified [facility] that patient is ready for transport to her home address. [phone]([address], [place] [address])

## 2018-04-05 NOTE — Discharge Instructions (Addendum)
Return if any problems.  See your Physician for medication management

## 2018-04-05 NOTE — ED Triage Notes (Signed)
[facility] Patient is complaining of dizziness, nausea, and vomiting x 1. Patient reports she was not represcribed Valium due to change in PCP. Also, patient is complaining of left kidney pain. Patient is paraplegic and has a suprapubic cath. She reports she had it changed a few weeks ago but was experiencing this pain at the time of change. Since the change she has experienced sediment in her urine and burning at her bladder area.

## 2018-04-06 ENCOUNTER — Inpatient Hospital Stay: Payer: Medicare Other | Admitting: Internal Medicine

## 2018-04-08 LAB — URINE CULTURE: Culture: >=100000 — AB

## 2018-04-09 ENCOUNTER — Telehealth: Payer: Self-pay | Admitting: Emergency Medicine

## 2018-04-09 ENCOUNTER — Telehealth: Payer: Self-pay | Admitting: *Deleted

## 2018-04-09 NOTE — Telephone Encounter (Signed)
Post ED Visit - Positive Culture Follow-up: Unsuccessful Patient Follow-up  Culture assessed and recommendations reviewed by:  []  Enzo BiNathan Batchelder, Pharm.D. []  Celedonio MiyamotoJeremy Frens, Pharm.D., BCPS AQ-ID []  Garvin FilaMike Maccia, Pharm.D., BCPS []  Georgina PillionElizabeth Martin, Pharm.D., BCPS []  ForsythMinh Pham, [address]Pharm.D., BCPS, AAHIVP []  Estella HuskMichelle Turner, Pharm.D., BCPS, AAHIVP []  Stacy Pearlachel Rumbarger, PharmD, BCPS []  Casilda Carlsaylor Stone, PharmD, BCPS []  Pollyann SamplesAndy Johnston, PharmD, BCPS  Positive urine culture  []  Patient discharged without antimicrobial prescription and treatment is now indicated [x]  Organism is resistant to prescribed ED discharge antimicrobial []  Patient with positive blood cultures   Unable to contact patient after 3 attempts, letter will be sent to address on file  Stacy PearlRobertson, [name] 04/09/2018, 9:28 AM

## 2018-04-09 NOTE — Progress Notes (Signed)
ED Antimicrobial Stewardship Positive Culture Follow Up   Stacy CraftBritany D Dinger is an 32 y.o. female who [facility] with a chief complaint of  Chief Complaint  Patient presents with  . Dizziness  . Urinary Tract Infection    Recent Results (from the past 720 hour(s))  Urine Culture     Status: Abnormal   Collection Time: 04/05/18  5:05 PM  Result Value Ref Range Status   Specimen Description   Final    URINE, RANDOM Performed at [facility], [address] [place], [place] [address]    Special Requests   Final    URINE, RANDOM Performed at [facility], [address] [place], [place] [address]    Culture (A)  Final    >=100,000 COLONIES/mL PSEUDOMONAS AERUGINOSA >=100,000 COLONIES/mL SERRATIA MARCESCENS    Report Status 04/08/2018 FINAL  Final   Organism ID, Bacteria PSEUDOMONAS AERUGINOSA (A)  Final   Organism ID, Bacteria SERRATIA MARCESCENS (A)  Final      Susceptibility   Pseudomonas aeruginosa - MIC*    CEFTAZIDIME <=1 SENSITIVE Sensitive     CIPROFLOXACIN <=0.25 SENSITIVE Sensitive     GENTAMICIN <=1 SENSITIVE Sensitive     IMIPENEM 2 SENSITIVE Sensitive     PIP/TAZO <=4 SENSITIVE Sensitive     CEFEPIME 2 SENSITIVE Sensitive     * >=100,000 COLONIES/mL PSEUDOMONAS AERUGINOSA   Serratia marcescens - MIC*    CEFAZOLIN >=64 RESISTANT Resistant     CEFTRIAXONE <=1 SENSITIVE Sensitive     CIPROFLOXACIN <=0.25 SENSITIVE Sensitive     GENTAMICIN <=1 SENSITIVE Sensitive     NITROFURANTOIN 256 RESISTANT Resistant     TRIMETH/SULFA <=20 SENSITIVE Sensitive     * >=100,000 COLONIES/mL SERRATIA MARCESCENS    [x]  Treated with cephalexin, organism resistant to prescribed antimicrobial  New antibiotic prescription: Ciprofloxacin 500mg  by mouth twice daily for five days  ED Provider: Elson AreasLeslie K Sofia PA-C   Anselm PancoastAmy Maryjo Ragon, PharmD Candidate 04/09/2018, 8:21 AM Phone# [phone]

## 2018-04-09 NOTE — Telephone Encounter (Signed)
Post ED Visit - Positive Culture Follow-up: Successful Patient Follow-Up  Culture assessed and recommendations reviewed by: []  Enzo BiNathan Batchelder, Pharm.D. []  Celedonio MiyamotoJeremy Frens, Pharm.D., BCPS AQ-ID []  Garvin FilaMike Maccia, Pharm.D., BCPS []  Georgina PillionElizabeth Martin, Pharm.D., BCPS []  EllenvilleMinh Pham, VermontPharm.D., BCPS, AAHIVP []  Estella HuskMichelle Turner, Pharm.D., BCPS, AAHIVP []  Lysle Pearlachel Rumbarger, PharmD, BCPS []  Casilda Carlsaylor Stone, PharmD, BCPS []  Pollyann SamplesAndy Johnston, PharmD, BCPS  Positive urine culture  []  Patient discharged without antimicrobial prescription and treatment is now indicated [x]  Organism is resistant to prescribed ED discharge antimicrobial []  Patient with positive blood cultures  Changes discussed with ED provider: Cheron SchaumannLeslie Sofia PA New antibiotic prescription start ciprlfloxacin 500mg  bid x 5 days [facility] [address] [phone]  Contacted patient, 04/09/2018 1638  [facility] returned call, noted drug interaction with zanaflex, spoke with Abelina Bachelor{A Sofia, states instruct patient to stop Zanaflex   Berle MullMiller, Tejal Monroy 04/09/2018, 4:34 PM

## 2018-04-21 ENCOUNTER — Ambulatory Visit (INDEPENDENT_AMBULATORY_CARE_PROVIDER_SITE_OTHER): Payer: Medicare Other | Admitting: Internal Medicine

## 2018-04-21 ENCOUNTER — Encounter: Payer: Self-pay | Admitting: Internal Medicine

## 2018-04-21 VITALS — BP 103/70 | HR 111 | Temp 98.7°F

## 2018-04-21 DIAGNOSIS — G8250 Quadriplegia, unspecified: Secondary | ICD-10-CM | POA: Diagnosis not present

## 2018-04-21 DIAGNOSIS — L8990 Pressure ulcer of unspecified site, unspecified stage: Secondary | ICD-10-CM | POA: Diagnosis not present

## 2018-04-21 DIAGNOSIS — IMO0002 Reserved for concepts with insufficient information to code with codable children: Secondary | ICD-10-CM

## 2018-04-21 DIAGNOSIS — L89300 Pressure ulcer of unspecified buttock, unstageable: Secondary | ICD-10-CM

## 2018-04-21 DIAGNOSIS — L089 Local infection of the skin and subcutaneous tissue, unspecified: Secondary | ICD-10-CM | POA: Diagnosis not present

## 2018-04-24 NOTE — Progress Notes (Signed)
[name], female   DOB: 08-27-1986, 32 y.o.   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 She was hospitalized in february for worsening drainage of wounds, fever, possibly recurrent complicated uti. MRI of the hip and lumbar spine shows a decubitus ulcer with diffuse cellulitis and mild fasciitis.  Scattered air and soft tissue most likely due to the decubiti.  Osteomyelitis involving the greater right trochanter.  No other definite areas of ostia was seen.  No cysts septic arthritis and no intrapelvic abscess was seen.  No loculated or drainable fluid collections identified. No evidence associated osteomyelitis within the subjacent sacrum. No other evidence for acute infection within the lumbar spine.ID was initially consulted but she declined services. We gave informal recommendation to treat with 6 wk of ceftriaxone plus metronidazole which ended in March. She states that her wounds are looking better. Some area not tracking as deep per her boyfriend who does wound care changes for [name]. She has not established care with wound care center. They do not have home health helping out with wound care presently. They pay out of pocket for their wound care supplies.  No fever, chills, nightsweats.     Outpatient Encounter Medications as of 04/21/2018  Medication Sig  . albuterol (PROVENTIL HFA;VENTOLIN HFA) 108 (90 BASE) MCG/ACT inhaler Inhale 2 puffs into the lungs every 4 (four) hours as needed for wheezing or shortness of breath.   . cyclobenzaprine (FLEXERIL) 10 MG tablet Take 1 tablet by mouth 3 (three) times daily.  Marland Kitchen HYDROmorphone (DILAUDID) 2 MG tablet Take 1 tablet (2 mg total) by mouth every 8 (eight) hours.  . pantoprazole  (PROTONIX) 20 MG tablet Take 20 mg by mouth daily after breakfast.   . pregabalin (LYRICA) 75 MG capsule Take 75 mg by mouth 3 (three) times daily.  . solifenacin (VESICARE) 10 MG tablet Take 10 mg by mouth every morning.   . tizanidine (ZANAFLEX) 2 MG capsule Take 2 mg by mouth 3 (three) times daily.  . [DISCONTINUED] cephALEXin (KEFLEX) 500 MG capsule Take 1 capsule (500 mg total) by mouth 4 (four) times daily. (Patient not taking: Reported on 04/21/2018)  . [DISCONTINUED] norethindrone (MICRONOR,CAMILA,ERRIN) 0.35 MG tablet Take 1 tablet by mouth daily.   No facility-administered encounter medications on file as of 04/21/2018.      Patient Active Problem List   Diagnosis Date Noted  . Recurrent UTI   . Quadriplegia following spinal cord injury (HCC)   . Wound infection 01/19/2018  . Microcytic hypochromic anemia 01/19/2018  . Sepsis (HCC) 01/19/2018  . Pressure injury of skin 01/19/2018  . Infected decubitus ulcer, unspecified pressure ulcer stage   . Oligomenorrhea 05/19/2012  . Spinal cord injury, cervical region [facility]) 05/19/2004     Health Maintenance Due  Topic Date Due  . TETANUS/TDAP  09/16/2005  . PAP SMEAR  09/17/2007     Review of Systems  Physical Exam   BP 103/70   Pulse (!) 111   Temp 98.7 F (37.1 C) (Oral)   LMP 03/24/2018 (Approximate)    Physical Exam  Constitutional:  oriented to person, place, and time. appears chronically ill,. No distress.  HENT: [place]/AT, PERRLA, no scleral icterus Mouth/Throat: Oropharynx is clear and moist. No oropharyngeal exudate.  Cardiovascular: Normal rate,  regular rhythm and normal heart sounds. Exam reveals no gallop and no friction rub.  No murmur heard.  Pulmonary/Chest: Effort normal and breath sounds normal. No respiratory distress.  has no wheezes.  Neck = supple, no nuchal rigidity Abdominal: Soft. Bowel sounds are normal.  exhibits no distension. SP catheter. Neurological: alert and oriented to person, place, and time.  Extremities wasted. O/5 motor in lower extremities Skin: bilateraly ischial wounds and midline superior cleft- little exudate in the wound bed. 85% good granulation tissue Psychiatric: a normal mood and affect.  behavior is normal. guarded  CBC Lab Results  Component Value Date   WBC 7.2 04/05/2018   RBC 4.22 04/05/2018   HGB 9.4 (L) 04/05/2018   HCT 31.5 (L) 04/05/2018   PLT 216 04/05/2018   MCV 74.6 (L) 04/05/2018   MCH 22.3 (L) 04/05/2018   MCHC 29.8 (L) 04/05/2018   RDW 16.5 (H) 04/05/2018   LYMPHSABS 1.6 04/05/2018   MONOABS 0.6 04/05/2018   EOSABS 0.1 04/05/2018    BMET Lab Results  Component Value Date   NA 141 04/05/2018   K 3.9 04/05/2018   CL 105 04/05/2018   CO2 27 04/05/2018   GLUCOSE 87 04/05/2018   BUN 12 04/05/2018   CREATININE 0.45 04/05/2018   CALCIUM 8.9 04/05/2018   GFRNONAA >60 04/05/2018   GFRAA >60 04/05/2018      Assessment and Plan  Chronic pressure wounds = recommend to get wound care at home to assess and provide direction for wound care management, and access to supplies. Will also refer to wound care center to see if they would be a candidate for alternative skin growth products.   Improve nutrition

## 2018-05-18 ENCOUNTER — Emergency Department (HOSPITAL_COMMUNITY): Payer: Medicare Other

## 2018-05-18 ENCOUNTER — Emergency Department (HOSPITAL_COMMUNITY)
Admission: EM | Admit: 2018-05-18 | Discharge: 2018-05-18 | Disposition: A | Discharge status: Home / Self Care | Payer: Medicare Other | Attending: Emergency Medicine | Admitting: Emergency Medicine

## 2018-05-18 ENCOUNTER — Encounter (HOSPITAL_COMMUNITY): Payer: Self-pay

## 2018-05-18 DIAGNOSIS — R112 Nausea with vomiting, unspecified: Secondary | ICD-10-CM | POA: Diagnosis present

## 2018-05-18 DIAGNOSIS — J45909 Unspecified asthma, uncomplicated: Secondary | ICD-10-CM | POA: Insufficient documentation

## 2018-05-18 DIAGNOSIS — Z79899 Other long term (current) drug therapy: Secondary | ICD-10-CM | POA: Insufficient documentation

## 2018-05-18 DIAGNOSIS — M62838 Other muscle spasm: Secondary | ICD-10-CM

## 2018-05-18 LAB — COMPREHENSIVE METABOLIC PANEL
ALT: 9 U/L — ABNORMAL LOW (ref 14–54)
ANION GAP: 10 (ref 5–15)
AST: 14 U/L — ABNORMAL LOW (ref 15–41)
Albumin: 3.8 g/dL (ref 3.5–5.0)
Alkaline Phosphatase: 67 U/L (ref 38–126)
BUN: 8 mg/dL (ref 6–20)
CHLORIDE: 101 mmol/L (ref 101–111)
CO2: 26 mmol/L (ref 22–32)
Calcium: 9.1 mg/dL (ref 8.9–10.3)
Creatinine, Ser: 0.47 mg/dL (ref 0.44–1.00)
Glucose, Bld: 107 mg/dL — ABNORMAL HIGH (ref 65–99)
Potassium: 3.9 mmol/L (ref 3.5–5.1)
Sodium: 137 mmol/L (ref 135–145)
Total Bilirubin: 0.5 mg/dL (ref 0.3–1.2)
Total Protein: 8.6 g/dL — ABNORMAL HIGH (ref 6.5–8.1)

## 2018-05-18 LAB — CBC WITH DIFFERENTIAL/PLATELET
Basophils Absolute: 0 10*3/uL (ref 0.0–0.1)
Basophils Relative: 0 %
Eosinophils Absolute: 0.1 10*3/uL (ref 0.0–0.7)
Eosinophils Relative: 2 %
HCT: 37.4 % (ref 36.0–46.0)
HEMOGLOBIN: 11.7 g/dL — AB (ref 12.0–15.0)
LYMPHS ABS: 1.1 10*3/uL (ref 0.7–4.0)
LYMPHS PCT: 16 %
MCH: 23.2 pg — AB (ref 26.0–34.0)
MCHC: 31.3 g/dL (ref 30.0–36.0)
MCV: 74.1 fL — AB (ref 78.0–100.0)
Monocytes Absolute: 0.4 10*3/uL (ref 0.1–1.0)
Monocytes Relative: 5 %
NEUTROS PCT: 77 %
Neutro Abs: 5.3 10*3/uL (ref 1.7–7.7)
Platelets: 242 10*3/uL (ref 150–400)
RBC: 5.05 MIL/uL (ref 3.87–5.11)
RDW: 20 % — ABNORMAL HIGH (ref 11.5–15.5)
WBC: 7 10*3/uL (ref 4.0–10.5)

## 2018-05-18 LAB — URINALYSIS, ROUTINE W REFLEX MICROSCOPIC
BILIRUBIN URINE: NEGATIVE
GLUCOSE, UA: NEGATIVE mg/dL
Hgb urine dipstick: NEGATIVE
KETONES UR: NEGATIVE mg/dL
Nitrite: NEGATIVE
PROTEIN: NEGATIVE mg/dL
Specific Gravity, Urine: 1.015 (ref 1.005–1.030)
pH: 8 (ref 5.0–8.0)

## 2018-05-18 LAB — PREGNANCY, URINE: Preg Test, Ur: NEGATIVE

## 2018-05-18 LAB — I-STAT CG4 LACTIC ACID, ED: Lactic Acid, Venous: 0.71 mmol/L (ref 0.5–1.9)

## 2018-05-18 MED ORDER — ONDANSETRON HCL 4 MG/2ML IJ SOLN
4.0000 mg | Freq: Once | INTRAMUSCULAR | Status: AC
  Administered 2018-05-18: 4 mg via INTRAVENOUS
  Filled 2018-05-18: qty 2

## 2018-05-18 MED ORDER — HYDROMORPHONE HCL 1 MG/ML IJ SOLN
1.0000 mg | Freq: Once | INTRAMUSCULAR | Status: AC
Start: 2018-05-18 — End: 2018-05-18
  Administered 2018-05-18: 1 mg via INTRAVENOUS
  Filled 2018-05-18: qty 1

## 2018-05-18 MED ORDER — ACETAMINOPHEN 325 MG PO TABS
650.0000 mg | ORAL_TABLET | Freq: Once | ORAL | Status: AC
  Administered 2018-05-18: 650 mg via ORAL
  Filled 2018-05-18: qty 2

## 2018-05-18 MED ORDER — SODIUM CHLORIDE 0.9 % IV BOLUS
500.0000 mL | Freq: Once | INTRAVENOUS | Status: AC
  Administered 2018-05-18: 500 mL via INTRAVENOUS

## 2018-05-18 MED ORDER — KETOROLAC TROMETHAMINE 30 MG/ML IJ SOLN
30.0000 mg | Freq: Once | INTRAMUSCULAR | Status: AC
  Administered 2018-05-18: 30 mg via INTRAVENOUS
  Filled 2018-05-18: qty 1

## 2018-05-18 MED ORDER — HYDROMORPHONE HCL 1 MG/ML IJ SOLN
0.5000 mg | Freq: Once | INTRAMUSCULAR | Status: AC
  Administered 2018-05-18: 0.5 mg via INTRAVENOUS
  Filled 2018-05-18: qty 1

## 2018-05-18 MED ORDER — DIAZEPAM 2 MG PO TABS
2.0000 mg | ORAL_TABLET | Freq: Once | ORAL | Status: AC
  Administered 2018-05-18: 2 mg via ORAL
  Filled 2018-05-18: qty 1

## 2018-05-18 MED ORDER — SODIUM CHLORIDE 0.9 % IV BOLUS
1000.0000 mL | Freq: Once | INTRAVENOUS | Status: AC
  Administered 2018-05-18: 1000 mL via INTRAVENOUS

## 2018-05-18 NOTE — ED Notes (Signed)
Notified [facility] for transportation back to patients residence.  

## 2018-05-18 NOTE — ED Triage Notes (Addendum)
Patient arrived via [facility] from home. Patient c/o pain from the waste down. Patient states it feels like her legs are having spasms and has nausea and has threw up once today. Patient c/o of being light headed. Patient is seeing a infection disease doctor and he told her if she feels unusual for her to be check out. Patient states her temperature has been normal. Patient is quadriplegic. Patient has 3 wounds on sacrum and tail bone and sore on right upper leg above knee. Patient has had a recent UTI. Patient c/o of a recent odor that has developed but she is not sure if it was from the recent UTI or wounds.

## 2018-05-18 NOTE — Discharge Instructions (Signed)
Continue taking your home medications as previously prescribed. Follow-up with the podiatrist listed below for further evaluation.  Follow-up with your neurologist and your primary care provider as well.

## 2018-05-18 NOTE — ED Notes (Signed)
Bed: WA20 Expected date:  Expected time:  Means of arrival:  Comments: EMS--HTN/Pain

## 2018-05-18 NOTE — ED Provider Notes (Signed)
[facility]-EMERGENCY DEPT Provider Note   CSN: [id number] Arrival date & time: 05/18/18  1251     History   Chief Complaint Chief Complaint  Patient presents with  . Emesis    HPI [name] is a 32 y.o. female with a past medical history of spinal cord injury, asthma, scoliosis, quadriplegia who presents to ED for evaluation of "spasms" of her left lower extremity, lower back pain, nausea, one episode of nonbloody, nonbilious emesis.  Her spasms began last week.  She has spasms at baseline which improved with Flexeril although have not been improving this week.  Nausea and emesis began this morning.  She states that her sacral wounds are being cleaned and dressed by her fianc daily.  They have not noticed any worsening of drainage or foul odor from it.  Patient has been taking her temperature and has not had a fever.  She denies any injuries or falls.  She denies any abdominal pain, chest pain, shortness of breath, headache.  She has a suprapubic catheter placed and has not noticed a foul odor or change in color lately.  HPI  Past Medical History:  Diagnosis Date  . Abnormal Pap smear   . Anemia   . ASCUS (atypical squamous cells of undetermined significance) on Pap smear 10/2011  . Asthma Mild, only with exposure to cigarette smoke  . Autonomic dysreflexia   . Chronic pain 2005  . Congenital absence of right kidney   . Decubitus ulcer of buttock, stage 2    bilateral-nondraining-redressed q other day  . Decubitus ulcer of coccygeal region, stage 1   . H/O scoliosis   . Horseshoe kidney   . Hx: UTI (urinary tract infection) 11/27/11  . Quadriplegia following spinal cord injury (HCC)   . Recurrent nephrolithiasis    s/p stent  . Recurrent UTI requires self catheterization  . Sciatica   . Spinal cord injury, cervical region (HCC) 05/19/2004   movement,sensation intact-unable to stand/ transfer since 2017 after septic shock episode per pt  .  Urinary tract infection 03/2017    Patient Active Problem List   Diagnosis Date Noted  . Recurrent UTI   . Quadriplegia following spinal cord injury (HCC)   . Wound infection 01/19/2018  . Microcytic hypochromic anemia 01/19/2018  . Sepsis (HCC) 01/19/2018  . Pressure injury of skin 01/19/2018  . Infected decubitus ulcer, unspecified pressure ulcer stage   . Oligomenorrhea 05/19/2012  . Spinal cord injury, cervical region [facility]) 05/19/2004    Past Surgical History:  Procedure Laterality Date  . BACK SURGERY  Scoliosis, Harrington Rods in place  . Bilateral leg surgery    . C3-6 spine surgery with rods    . dvt filter placement  2005  . ESOPHAGOGASTRODUODENOSCOPY (EGD) WITH PROPOFOL N/A 01/07/2018   Procedure: ESOPHAGOGASTRODUODENOSCOPY (EGD) WITH PROPOFOL;  Surgeon: Kathi Der, MD;  Location: [facility];  Service: Gastroenterology;  Laterality: N/A;  . INSERTION OF SUPRAPUBIC CATHETER N/A 05/15/2017   Procedure: INSERTION OF SUPRAPUBIC CATHETER;  Surgeon: Ihor Gully, MD;  Location: [facility] ;  Service: Urology;  Laterality: N/A;  . LITHOTRIPSY    . ORTHOPEDIC SURGERY  Multiple lower extremity surgeries as a child  . VASCULAR SURGERY  IVC filter placed in 2005     OB History    Gravida  0   Para      Term      Preterm      AB      Living  SAB      TAB      Ectopic      Multiple      Live Births               Home Medications    Prior to Admission medications   Medication Sig Start Date End Date Taking? Authorizing Provider  albuterol (PROVENTIL HFA;VENTOLIN HFA) 108 (90 BASE) MCG/ACT inhaler Inhale 2 puffs into the lungs every 4 (four) hours as needed for wheezing or shortness of breath.    Yes [provider]  cyclobenzaprine (FLEXERIL) 10 MG tablet Take 1 tablet by mouth 2 (two) times daily.  04/07/18  Yes [provider]  HYDROmorphone (DILAUDID) 4 MG tablet Take 4 mg by mouth every 6 (six) hours as  needed for pain. 04/08/18  Yes [provider]  ibuprofen (ADVIL,MOTRIN) 200 MG tablet Take 400 mg by mouth every 6 (six) hours as needed for headache or moderate pain.   Yes [provider]  ketoconazole (NIZORAL) 2 % cream Apply 1 application topically daily as needed for dry skin. 04/28/18  Yes [provider]  mometasone (ELOCON) 0.1 % cream Apply 1 application topically daily as needed for dry skin. 04/28/18  Yes [provider]  POLY-IRON 150 150 MG capsule Take 1 capsule by mouth daily. 04/21/18  Yes [provider]  pregabalin (LYRICA) 75 MG capsule Take 75 mg by mouth 3 (three) times daily.   Yes [provider]  solifenacin (VESICARE) 10 MG tablet Take 10 mg by mouth every morning.    Yes [provider]  HYDROmorphone (DILAUDID) 2 MG tablet Take 1 tablet (2 mg total) by mouth every 8 (eight) hours. Patient not taking: Reported on 05/18/2018 02/07/18   Maxwell Caul, PA-C  norethindrone (MICRONOR,CAMILA,ERRIN) 0.35 MG tablet Take 1 tablet by mouth daily.  03/10/12  [provider]    Family History Family History  Problem Relation Age of Onset  . Heart disease Mother        Massive MI in 04/23/05 ; died    Social History Social History   Tobacco Use  . Smoking status: Never Smoker  . Smokeless tobacco: Never Used  Substance Use Topics  . Alcohol use: No  . Drug use: No     Allergies   Metoclopramide hcl; Benadryl [diphenhydramine hcl]; Gabapentin; Morphine; Vancomycin; and Zosyn [piperacillin-tazobactam in dex]   Review of Systems Review of Systems  Constitutional: Negative for appetite change, chills and fever.  HENT: Negative for ear pain, rhinorrhea, sneezing and sore throat.   Eyes: Negative for photophobia and visual disturbance.  Respiratory: Negative for cough, chest tightness, shortness of breath and wheezing.   Cardiovascular: Negative for chest pain and palpitations.  Gastrointestinal: Positive  for nausea and vomiting. Negative for abdominal pain, blood in stool, constipation and diarrhea.  Genitourinary: Negative for dysuria, hematuria and urgency.  Musculoskeletal: Negative for myalgias.       +spasms  Skin: Negative for rash.  Neurological: Negative for dizziness, weakness and light-headedness.     Physical Exam Updated Vital Signs BP (!) 162/115 (BP Location: Right Arm)   Pulse 99   Temp 98.1 F (36.7 C) (Oral)   Resp 18   LMP 05/11/2018 (Approximate)   SpO2 100%   Physical Exam  Constitutional: She appears well-developed and well-nourished. No distress.  Nontoxic-appearing and in no acute distress.  Speaking in complete sentences without difficulty.  HENT:  Head: Normocephalic and atraumatic.  Nose: Nose normal.  Eyes: Conjunctivae and EOM are normal. Right eye exhibits no discharge. Left eye exhibits no discharge. No scleral icterus.  Neck: Normal range of motion. Neck supple.  Cardiovascular: Normal rate, regular rhythm, normal heart sounds and intact distal pulses. Exam reveals no gallop and no friction rub.  No murmur heard. Pulmonary/Chest: Effort normal and breath sounds normal. No respiratory distress.  Abdominal: Soft. Bowel sounds are normal. She exhibits no distension. There is no tenderness. There is no guarding.  Suprapubic catheter in place with normal appearing urine.  Musculoskeletal: Normal range of motion. She exhibits no edema.  Neurological: She is alert. She displays atrophy. She exhibits abnormal muscle tone. Coordination normal.  Skin: Skin is warm and dry. No rash noted.  Stage IV decubitus ulcers on bilateral buttock and sacrum. Mild drainage noted. No warmth or erythema noted. Onychomycosis of L great toenail.  Psychiatric: She has a normal mood and affect.  Nursing note and vitals reviewed.    ED Treatments / Results  Labs (all labs ordered are listed, but only abnormal results are displayed) Labs Reviewed  COMPREHENSIVE METABOLIC  PANEL - Abnormal; Notable for the following components:      Result Value   Glucose, Bld 107 (*)    Total Protein 8.6 (*)    AST 14 (*)    ALT 9 (*)    All other components within normal limits  CBC WITH DIFFERENTIAL/PLATELET - Abnormal; Notable for the following components:   Hemoglobin 11.7 (*)    MCV 74.1 (*)    MCH 23.2 (*)    RDW 20.0 (*)    All other components within normal limits  URINALYSIS, ROUTINE W REFLEX MICROSCOPIC - Abnormal; Notable for the following components:   APPearance HAZY (*)    Leukocytes, UA MODERATE (*)    Bacteria, UA FEW (*)    All other components within normal limits  PREGNANCY, URINE  I-STAT CG4 LACTIC ACID, ED    EKG None  Radiology Dg Chest 2 View  Result Date: 05/18/2018 CLINICAL DATA:  Emesis EXAM: CHEST - 2 VIEW COMPARISON:  01/19/2018 FINDINGS: Cardiac shadow is stable. Postsurgical changes in the cervical and thoracic spine are again seen and stable. The lungs are well aerated without focal infiltrate or sizable effusion. IVC filter is noted in place. Stable chest wall deformity is noted. IMPRESSION: No acute abnormality noted. Electronically Signed   By: Alcide Clever M.D.   On: 05/18/2018 15:18    Procedures Procedures (including critical care time)  Medications Ordered in ED Medications  HYDROmorphone (DILAUDID) injection 0.5 mg (has no administration in time range)  ketorolac (TORADOL) 30 MG/ML injection 30 mg (has no administration in time range)  HYDROmorphone (DILAUDID) injection 1 mg (1 mg Intravenous Given 05/18/18 1429)  sodium chloride 0.9 % bolus 1,000 mL (0 mLs Intravenous Stopped 05/18/18 1549)  ondansetron (ZOFRAN) injection 4 mg (4 mg Intravenous Given 05/18/18 1429)  diazepam (VALIUM) tablet 2 mg (2 mg Oral Given 05/18/18 1645)  sodium chloride 0.9 % bolus 500 mL (0 mLs Intravenous Stopped 05/18/18 1751)  acetaminophen (TYLENOL) tablet 650 mg (650 mg Oral Given 05/18/18 1656)     Initial Impression / Assessment and Plan /  ED Course  I have reviewed the triage vital signs and the nursing notes.  Pertinent labs & imaging results that were available during my care of the patient were reviewed by me and considered in my medical decision making (see chart for details).  Clinical Course as of May 19 1751  Tue May 18, 2018  1548 HR improved to 105.   [HK]  1752 Pulse Rate: 99 [HK]    Clinical Course User Index [HK] Dietrich Pates, PA-C    Patient presents to ED for evaluation of "spasms" of left lower extremity, lower back pain, nausea and one episode of nonbloody, nonbilious emesis.  Spasms began last week.  She has spasms at baseline which improved with Flexeril.  She used to be on Valium and stated this worked much better for her although the Flexeril is helping.  She has a history of spinal cord injury, scoliosis, quadriplegia although able to have's movement of her upper and lower extremities over time.  She has not noticed any worsening drainage or foul order from her wounds or the urine in her suprapubic catheter.  Denies any abdominal pain, chest pain or shortness of breath.  On physical exam she is overall well-appearing.  There is muscle atrophy noted but no spasms noted on my examination throughout her entire ED visit.  She does have onychomycosis of the left great toe which she would like a referral to a podiatrist for.  She is afebrile.  She was initially tachycardic and hypertensive which improved with giving her pain medication.  Lab work including CBC, CMP, urinalysis unremarkable.  Lactic acid level is normal.  Chest x-ray is unremarkable.  Patient was given dose of her pain medication, Valium and fluids here with significant improvement in her symptoms.  Heart rate has improved.  I did encourage the patient to follow-up with her primary care provider, neurologist and pain management provider for any further medication changes.  Patient is agreeable to this plan.  She is able to tolerate p.o. intake without  difficulty prior to discharge.  Advised to return to ED for any severe worsening symptoms.  Portions of this note were generated with Scientist, clinical (histocompatibility and immunogenetics). Dictation errors may occur despite best attempts at proofreading.  Final Clinical Impressions(s) / ED Diagnoses   Final diagnoses:  Non-intractable vomiting with nausea, unspecified vomiting type  Muscle spasm of left lower extremity    ED Discharge Orders    None       Dietrich Pates, PA-C 05/18/18 1755    Nira Conn, MD 05/22/18 1057

## 2018-05-18 NOTE — ED Notes (Addendum)
Patient unable to sign signature pad. [facility] has been called.

## 2018-05-19 ENCOUNTER — Encounter (HOSPITAL_BASED_OUTPATIENT_CLINIC_OR_DEPARTMENT_OTHER): Payer: Medicare Other

## 2018-05-21 ENCOUNTER — Observation Stay (HOSPITAL_COMMUNITY)
Admission: EM | Admit: 2018-05-21 | Discharge: 2018-05-22 | Disposition: A | Discharge status: Home / Self Care | Payer: Medicare Other | Attending: Internal Medicine | Admitting: Internal Medicine

## 2018-05-21 ENCOUNTER — Encounter (HOSPITAL_COMMUNITY): Payer: Self-pay

## 2018-05-21 ENCOUNTER — Other Ambulatory Visit: Payer: Self-pay

## 2018-05-21 DIAGNOSIS — Z880 Allergy status to penicillin: Secondary | ICD-10-CM | POA: Insufficient documentation

## 2018-05-21 DIAGNOSIS — Z79899 Other long term (current) drug therapy: Secondary | ICD-10-CM | POA: Insufficient documentation

## 2018-05-21 DIAGNOSIS — L89329 Pressure ulcer of left buttock, unspecified stage: Secondary | ICD-10-CM | POA: Insufficient documentation

## 2018-05-21 DIAGNOSIS — J45909 Unspecified asthma, uncomplicated: Secondary | ICD-10-CM | POA: Insufficient documentation

## 2018-05-21 DIAGNOSIS — M549 Dorsalgia, unspecified: Secondary | ICD-10-CM | POA: Diagnosis present

## 2018-05-21 DIAGNOSIS — G8929 Other chronic pain: Secondary | ICD-10-CM | POA: Insufficient documentation

## 2018-05-21 DIAGNOSIS — R110 Nausea: Secondary | ICD-10-CM | POA: Diagnosis present

## 2018-05-21 DIAGNOSIS — G8250 Quadriplegia, unspecified: Secondary | ICD-10-CM | POA: Diagnosis not present

## 2018-05-21 DIAGNOSIS — Z960 Presence of urogenital implants: Secondary | ICD-10-CM | POA: Diagnosis not present

## 2018-05-21 DIAGNOSIS — L89319 Pressure ulcer of right buttock, unspecified stage: Secondary | ICD-10-CM | POA: Insufficient documentation

## 2018-05-21 DIAGNOSIS — R000 Tachycardia, unspecified: Secondary | ICD-10-CM | POA: Insufficient documentation

## 2018-05-21 DIAGNOSIS — Z9359 Other cystostomy status: Secondary | ICD-10-CM

## 2018-05-21 DIAGNOSIS — Z888 Allergy status to other drugs, medicaments and biological substances status: Secondary | ICD-10-CM | POA: Insufficient documentation

## 2018-05-21 DIAGNOSIS — N3000 Acute cystitis without hematuria: Principal | ICD-10-CM | POA: Insufficient documentation

## 2018-05-21 DIAGNOSIS — Z87440 Personal history of urinary (tract) infections: Secondary | ICD-10-CM | POA: Insufficient documentation

## 2018-05-21 DIAGNOSIS — Z885 Allergy status to narcotic agent status: Secondary | ICD-10-CM | POA: Insufficient documentation

## 2018-05-21 DIAGNOSIS — M419 Scoliosis, unspecified: Secondary | ICD-10-CM | POA: Diagnosis not present

## 2018-05-21 DIAGNOSIS — G904 Autonomic dysreflexia: Secondary | ICD-10-CM | POA: Diagnosis not present

## 2018-05-21 DIAGNOSIS — I4581 Long QT syndrome: Secondary | ICD-10-CM | POA: Diagnosis not present

## 2018-05-21 DIAGNOSIS — Q600 Renal agenesis, unilateral: Secondary | ICD-10-CM | POA: Insufficient documentation

## 2018-05-21 DIAGNOSIS — R252 Cramp and spasm: Secondary | ICD-10-CM | POA: Diagnosis not present

## 2018-05-21 DIAGNOSIS — R531 Weakness: Secondary | ICD-10-CM | POA: Insufficient documentation

## 2018-05-21 DIAGNOSIS — Z87442 Personal history of urinary calculi: Secondary | ICD-10-CM | POA: Insufficient documentation

## 2018-05-21 DIAGNOSIS — Z79891 Long term (current) use of opiate analgesic: Secondary | ICD-10-CM | POA: Diagnosis not present

## 2018-05-21 DIAGNOSIS — M79606 Pain in leg, unspecified: Secondary | ICD-10-CM

## 2018-05-21 DIAGNOSIS — Z9889 Other specified postprocedural states: Secondary | ICD-10-CM | POA: Diagnosis not present

## 2018-05-21 DIAGNOSIS — Z793 Long term (current) use of hormonal contraceptives: Secondary | ICD-10-CM | POA: Insufficient documentation

## 2018-05-21 DIAGNOSIS — Z881 Allergy status to other antibiotic agents status: Secondary | ICD-10-CM | POA: Diagnosis not present

## 2018-05-21 HISTORY — DX: Pain in leg, unspecified: M79.606

## 2018-05-21 LAB — COMPREHENSIVE METABOLIC PANEL
ALBUMIN: 4.1 g/dL (ref 3.5–5.0)
ALT: 11 U/L — ABNORMAL LOW (ref 14–54)
ANION GAP: 10 (ref 5–15)
AST: 17 U/L (ref 15–41)
Alkaline Phosphatase: 75 U/L (ref 38–126)
BILIRUBIN TOTAL: 0.6 mg/dL (ref 0.3–1.2)
BUN: 8 mg/dL (ref 6–20)
CO2: 26 mmol/L (ref 22–32)
Calcium: 9.3 mg/dL (ref 8.9–10.3)
Chloride: 101 mmol/L (ref 101–111)
Creatinine, Ser: 0.4 mg/dL — ABNORMAL LOW (ref 0.44–1.00)
GFR calc Af Amer: >60 mL/min (ref >60)
Glucose, Bld: 91 mg/dL (ref 65–99)
POTASSIUM: 4 mmol/L (ref 3.5–5.1)
Sodium: 137 mmol/L (ref 135–145)
TOTAL PROTEIN: 8.7 g/dL — AB (ref 6.5–8.1)

## 2018-05-21 LAB — CBC WITH DIFFERENTIAL/PLATELET
BASOS PCT: 1 %
Basophils Absolute: 0.1 10*3/uL (ref 0.0–0.1)
Eosinophils Absolute: 0.2 10*3/uL (ref 0.0–0.7)
Eosinophils Relative: 2 %
HEMATOCRIT: 40.7 % (ref 36.0–46.0)
Hemoglobin: 13.1 g/dL (ref 12.0–15.0)
Lymphocytes Relative: 19 %
Lymphs Abs: 2 10*3/uL (ref 0.7–4.0)
MCH: 23.8 pg — ABNORMAL LOW (ref 26.0–34.0)
MCHC: 32.2 g/dL (ref 30.0–36.0)
MCV: 73.9 fL — AB (ref 78.0–100.0)
MONO ABS: 0.8 10*3/uL (ref 0.1–1.0)
MONOS PCT: 7 %
NEUTROS ABS: 7.7 10*3/uL (ref 1.7–7.7)
Neutrophils Relative %: 71 %
Platelets: 259 10*3/uL (ref 150–400)
RBC: 5.51 MIL/uL — ABNORMAL HIGH (ref 3.87–5.11)
RDW: 20.3 % — ABNORMAL HIGH (ref 11.5–15.5)
WBC: 10.8 10*3/uL — ABNORMAL HIGH (ref 4.0–10.5)

## 2018-05-21 LAB — URINALYSIS, ROUTINE W REFLEX MICROSCOPIC
Bilirubin Urine: NEGATIVE
GLUCOSE, UA: NEGATIVE mg/dL
Hgb urine dipstick: NEGATIVE
KETONES UR: 5 mg/dL — AB
NITRITE: NEGATIVE
PH: 7 (ref 5.0–8.0)
Protein, ur: >=300 mg/dL — AB
Specific Gravity, Urine: 1.019 (ref 1.005–1.030)
WBC, UA: >50 WBC/hpf — ABNORMAL HIGH (ref 0–5)

## 2018-05-21 LAB — I-STAT CG4 LACTIC ACID, ED: LACTIC ACID, VENOUS: 0.84 mmol/L (ref 0.5–1.9)

## 2018-05-21 LAB — CK: CK TOTAL: 51 U/L (ref 38–234)

## 2018-05-21 MED ORDER — PREGABALIN 75 MG PO CAPS: 75.0000 mg | ORAL_CAPSULE | Freq: Two times a day (BID) | ORAL | 0 refills | Status: DC

## 2018-05-21 MED ORDER — HYDROMORPHONE HCL 2 MG/ML IJ SOLN: 2.0000 mg | INTRAMUSCULAR | Status: AC

## 2018-05-21 MED ORDER — HYDROMORPHONE HCL 2 MG/ML IJ SOLN
2.0000 mg | INTRAMUSCULAR | Status: AC
  Administered 2018-05-21: 2 mg via INTRAVENOUS
  Filled 2018-05-21: qty 1

## 2018-05-21 MED ORDER — HYDROMORPHONE HCL 2 MG/ML IJ SOLN
2.0000 mg | INTRAMUSCULAR | Status: AC
  Administered 2018-05-21 (×2): 2 mg via INTRAVENOUS
  Filled 2018-05-21: qty 1

## 2018-05-21 MED ORDER — PROCHLORPERAZINE EDISYLATE 10 MG/2ML IJ SOLN
5.0000 mg | Freq: Once | INTRAMUSCULAR | Status: AC
  Administered 2018-05-21: 5 mg via INTRAVENOUS
  Filled 2018-05-21: qty 2

## 2018-05-21 MED ORDER — PREGABALIN 25 MG PO CAPS
75.0000 mg | ORAL_CAPSULE | Freq: Once | ORAL | Status: AC
  Administered 2018-05-21: 75 mg via ORAL
  Filled 2018-05-21: qty 1

## 2018-05-21 MED ORDER — ACETAMINOPHEN 325 MG PO TABS
650.0000 mg | ORAL_TABLET | Freq: Once | ORAL | Status: AC
  Administered 2018-05-21: 650 mg via ORAL
  Filled 2018-05-21: qty 2

## 2018-05-21 MED ORDER — HYDROMORPHONE HCL 1 MG/ML IJ SOLN
1.0000 mg | Freq: Once | INTRAMUSCULAR | Status: AC
  Administered 2018-05-21: 1 mg via INTRAVENOUS
  Filled 2018-05-21: qty 1

## 2018-05-21 MED ORDER — CEPHALEXIN 500 MG PO CAPS: ORAL_CAPSULE | ORAL | 0 refills | Status: DC

## 2018-05-21 MED ORDER — DIAZEPAM 5 MG/ML IJ SOLN
5.0000 mg | Freq: Once | INTRAMUSCULAR | Status: AC
  Administered 2018-05-21: 5 mg via INTRAVENOUS
  Filled 2018-05-21: qty 2

## 2018-05-21 MED ORDER — HYDROMORPHONE HCL 2 MG/ML IJ SOLN
2.0000 mg | INTRAMUSCULAR | Status: AC
  Filled 2018-05-21: qty 1

## 2018-05-21 MED ORDER — ONDANSETRON HCL 4 MG/2ML IJ SOLN
4.0000 mg | INTRAMUSCULAR | Status: AC
  Administered 2018-05-21: 4 mg via INTRAVENOUS
  Filled 2018-05-21: qty 2

## 2018-05-21 MED ORDER — DIAZEPAM 2 MG PO TABS
2.0000 mg | ORAL_TABLET | Freq: Once | ORAL | Status: AC
  Administered 2018-05-21: 2 mg via ORAL
  Filled 2018-05-21: qty 1

## 2018-05-21 MED ORDER — SODIUM CHLORIDE 0.9 % IV BOLUS
1000.0000 mL | Freq: Once | INTRAVENOUS | Status: AC
Start: 2018-05-21 — End: 2018-05-21
  Administered 2018-05-21: 1000 mL via INTRAVENOUS

## 2018-05-21 MED ORDER — DIAZEPAM 5 MG PO TABS: 5.0000 mg | ORAL_TABLET | Freq: Three times a day (TID) | ORAL | 0 refills | Status: DC | PRN

## 2018-05-21 MED ORDER — ONDANSETRON HCL 4 MG/2ML IJ SOLN
4.0000 mg | Freq: Once | INTRAMUSCULAR | Status: AC
  Administered 2018-05-21: 4 mg via INTRAVENOUS
  Filled 2018-05-21: qty 2

## 2018-05-21 MED ORDER — SODIUM CHLORIDE 0.9 % IV SOLN
1.0000 g | Freq: Once | INTRAVENOUS | Status: AC
  Administered 2018-05-21: 1 g via INTRAVENOUS
  Filled 2018-05-21: qty 10

## 2018-05-21 NOTE — Discharge Instructions (Addendum)
Contact a health care provider if: °You have back pain. °You have a fever. °You feel nauseous or vomit. °Your symptoms do not get better after 3 days. °Your symptoms go away and then return. °Get help right away if: °You have severe back pain or lower abdominal pain. °You are vomiting and cannot keep down any medicines or water. °

## 2018-05-21 NOTE — ED Notes (Signed)
This RN attempted x1 PIV placement, without success. Patient requests US IV placement. US IV consult ordered- team at bedside at this time.

## 2018-05-21 NOTE — ED Provider Notes (Signed)
Patient seen and evaluated.  Discussed with Tonny Bollman, PA.  Patient has history of incomplete quadriplegia secondary to spinal injury.  Has sensation into her abdomen and trunk.  Has weakness and spasticity.  Complaint today is that she is increasing spasticity of her legs.  Seen evaluate a few days ago.  Got symptomatic relief while here.  She is on Dilaudid for pain and continues on this at a regular dosage.  She was on Valium for spasticity for years until February.  She changed physicians as she moved pounds.  Her new physician was "uncomfortable" prescribing her Valium and tried tizanidine and Flexeril over the last several months she has had progressive spasticity.  She has a new appointment with a physician on Monday.  Neurology.  She is in hopes of re hurting her benzodiazepines for chronic spasticity.  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  She denies feeling acutely ill.  However she is concerned about increasing spasticity of her legs.   She has decubiti.  She follows with Dr. Ilsa Iha.  States she is were visualized by Dr. Ilsa Iha a week ago.  Wounds today show grade 3 decubiti that do not appear infected and are not draining.  She has sensation in her abdomen.  She has a subpubic catheter that she claims during the day.  She is able to sense when her bladder is full.  She only "hooked up to a bag" at night.  She can feel me touching her abdomen and she has no tenderness as I examine her.  I am not concerned that she has an occult intra-abdominal process.  Is tachycardic and hypertensive likely autonomic instability secondary to symptoms.  Her legs are contractured upward.  Plan her symptom control with pain medication and Valium.  I will write her for Valium for the next 7 days to get her through her appointment with neurology.  She will then need a long-term plan for symptom control with either neurology  and/or pain management.   Rolland Porter, MD 05/21/18 [phone]

## 2018-05-21 NOTE — ED Notes (Signed)
Bed: ZO10WA11 Expected date:  Expected time:  Means of arrival:  Comments: EMS-nausea/pain

## 2018-05-21 NOTE — ED Notes (Signed)
[facility] given report

## 2018-05-21 NOTE — ED Provider Notes (Signed)
[facility]-EMERGENCY DEPT Provider Note   CSN: [id number] Arrival date & time: 05/21/18  1211     History   Chief Complaint Chief Complaint  Patient presents with  . Back Pain  . Nausea    HPI [name] is a 32 y.o. female who presents the emergency department for chief complaint of leg pain and bedsores.  She is a past medical history of spinal cord injury and incomplete quadriplegia.  She also has a history of autonomic dysreflexia.  Patient was seen for similar complaints 2 days ago.  She states that she returned because her symptoms are worsening and her pain is uncontrolled.  Normally she has sensation in her legs and can move her legs.  She states she has taken steps on her legs in the past.  She is complaining that the spasticity in her lower extremities and numbness tingling and pain radiating down her legs is out of control on her normal pain medication regimen.  She is currently taking 4 mg Dilaudid 3 times daily along with gabapentin and Flexeril.  Patient also has decubitus ulcers.  She states that she is worried that there is may be some potential infection.  She has a suprapubic catheter in place and states that she has not noticed any abnormal drainage or foul odor.  Denies fever or chills.  HPI  Past Medical History:  Diagnosis Date  . Abnormal Pap smear   . Anemia   . ASCUS (atypical squamous cells of undetermined significance) on Pap smear 10/2011  . Asthma Mild, only with exposure to cigarette smoke  . Autonomic dysreflexia   . Chronic pain 2005  . Congenital absence of right kidney   . Decubitus ulcer of buttock, stage 2    bilateral-nondraining-redressed q other day  . Decubitus ulcer of coccygeal region, stage 1   . H/O scoliosis   . Horseshoe kidney   . Hx: UTI (urinary tract infection) 11/27/11  . Quadriplegia following spinal cord injury (HCC)   . Recurrent nephrolithiasis    s/p stent  . Recurrent UTI requires self  catheterization  . Sciatica   . Spinal cord injury, cervical region (HCC) 05/19/2004   movement,sensation intact-unable to stand/ transfer since 2017 after septic shock episode per pt  . Urinary tract infection 03/2017    Patient Active Problem List   Diagnosis Date Noted  . Recurrent UTI   . Quadriplegia following spinal cord injury (HCC)   . Wound infection 01/19/2018  . Microcytic hypochromic anemia 01/19/2018  . Sepsis (HCC) 01/19/2018  . Pressure injury of skin 01/19/2018  . Infected decubitus ulcer, unspecified pressure ulcer stage   . Oligomenorrhea 05/19/2012  . Spinal cord injury, cervical region [facility]) 05/19/2004    Past Surgical History:  Procedure Laterality Date  . BACK SURGERY  Scoliosis, Harrington Rods in place  . Bilateral leg surgery    . C3-6 spine surgery with rods    . dvt filter placement  2005  . ESOPHAGOGASTRODUODENOSCOPY (EGD) WITH PROPOFOL N/A 01/07/2018   Procedure: ESOPHAGOGASTRODUODENOSCOPY (EGD) WITH PROPOFOL;  Surgeon: Kathi Der, MD;  Location: [facility];  Service: Gastroenterology;  Laterality: N/A;  . INSERTION OF SUPRAPUBIC CATHETER N/A 05/15/2017   Procedure: INSERTION OF SUPRAPUBIC CATHETER;  Surgeon: Ihor Gully, MD;  Location: [facility] [place];  Service: Urology;  Laterality: N/A;  . LITHOTRIPSY    . ORTHOPEDIC SURGERY  Multiple lower extremity surgeries as a child  . VASCULAR SURGERY  IVC filter placed in 2005  OB History    Gravida  0   Para      Term      Preterm      AB      Living        SAB      TAB      Ectopic      Multiple      Live Births               Home Medications    Prior to Admission medications   Medication Sig Start Date End Date Taking? Authorizing Provider  albuterol (PROVENTIL HFA;VENTOLIN HFA) 108 (90 BASE) MCG/ACT inhaler Inhale 2 puffs into the lungs every 4 (four) hours as needed for wheezing or shortness of breath.    Yes [provider]    cyclobenzaprine (FLEXERIL) 10 MG tablet Take 1 tablet by mouth 2 (two) times daily.  04/07/18  Yes [provider]  HYDROmorphone (DILAUDID) 4 MG tablet Take 4 mg by mouth every 6 (six) hours as needed for pain. 04/08/18  Yes [provider]  ibuprofen (ADVIL,MOTRIN) 200 MG tablet Take 400 mg by mouth every 6 (six) hours as needed for headache or moderate pain.   Yes [provider]  ketoconazole (NIZORAL) 2 % cream Apply 1 application topically daily as needed for dry skin. 04/28/18  Yes [provider]  mometasone (ELOCON) 0.1 % cream Apply 1 application topically daily as needed for dry skin. 04/28/18  Yes [provider]  POLY-IRON 150 150 MG capsule Take 1 capsule by mouth daily. 05-13-18  Yes [provider]  pregabalin (LYRICA) 75 MG capsule Take 75 mg by mouth 3 (three) times daily.   Yes [provider]  solifenacin (VESICARE) 10 MG tablet Take 10 mg by mouth every morning.    Yes [provider]  HYDROmorphone (DILAUDID) 2 MG tablet Take 1 tablet (2 mg total) by mouth every 8 (eight) hours. Patient not taking: Reported on 05/18/2018 02/07/18   Maxwell Caul, PA-C  norethindrone (MICRONOR,CAMILA,ERRIN) 0.35 MG tablet Take 1 tablet by mouth daily.  03/10/12  [provider]    Family History Family History  Problem Relation Age of Onset  . Heart disease Mother        Massive MI in 2005-05-13 ; died    Social History Social History   Tobacco Use  . Smoking status: Never Smoker  . Smokeless tobacco: Never Used  Substance Use Topics  . Alcohol use: No  . Drug use: No     Allergies   Metoclopramide hcl; Benadryl [diphenhydramine hcl]; Gabapentin; Morphine; Vancomycin; and Zosyn [piperacillin-tazobactam in dex]   Review of Systems Review of Systems Ten systems reviewed and are negative for acute change, except as noted in the HPI.    Physical Exam Updated Vital Signs BP (!) 161/115 (BP Location: Right  Arm) Comment: Patient reports her muscles are "spasming" due to her autonomic dysreflexia  Pulse (!) 118   Temp 99 F (37.2 C) (Oral)   Resp 15   Wt 45.4 kg (100 lb)   LMP 05/11/2018 (Approximate)   SpO2 99%   BMI 21.64 kg/m   Physical Exam  Constitutional: She is oriented to person, place, and time. No distress.  Patient appears chronically ill  HENT:  Head: Normocephalic and atraumatic.  Eyes: Conjunctivae are normal. No scleral icterus.  Neck: Normal range of motion.  Cardiovascular: Normal rate, regular rhythm and normal heart sounds. Exam reveals no gallop and no  friction rub.  No murmur heard. Pulmonary/Chest: Effort normal and breath sounds normal. No respiratory distress.  Abdominal: Soft. Bowel sounds are normal. She exhibits no distension and no mass. There is no tenderness. There is no guarding.  Neurological: She is alert and oriented to person, place, and time.  Bilateral lower extremity spasticity, Global muscular atrophy.  Skin: Skin is warm and dry. She is not diaphoretic.  Psychiatric: Her behavior is normal.  Nursing note and vitals reviewed.    ED Treatments / Results  Labs (all labs ordered are listed, but only abnormal results are displayed) Labs Reviewed  CBC WITH DIFFERENTIAL/PLATELET - Abnormal; Notable for the following components:      Result Value   WBC 10.8 (*)    RBC 5.51 (*)    MCV 73.9 (*)    MCH 23.8 (*)    RDW 20.3 (*)    All other components within normal limits  URINALYSIS, ROUTINE W REFLEX MICROSCOPIC  COMPREHENSIVE METABOLIC PANEL  I-STAT CG4 LACTIC ACID, ED    EKG None  Radiology No results found.  Procedures Procedures (including critical care time)  Medications Ordered in ED Medications  sodium chloride 0.9 % bolus 1,000 mL (1,000 mLs Intravenous New Bag/Given 05/21/18 1403)  HYDROmorphone (DILAUDID) injection 2 mg (has no administration in time range)    Or  HYDROmorphone (DILAUDID) injection 2 mg (has no  administration in time range)  HYDROmorphone (DILAUDID) injection 2 mg (has no administration in time range)    Or  HYDROmorphone (DILAUDID) injection 2 mg (has no administration in time range)  HYDROmorphone (DILAUDID) injection 2 mg (has no administration in time range)    Or  HYDROmorphone (DILAUDID) injection 2 mg (has no administration in time range)  HYDROmorphone (DILAUDID) injection 2 mg (2 mg Intravenous Given 05/21/18 1404)    Or  HYDROmorphone (DILAUDID) injection 2 mg ( Subcutaneous See Alternative 05/21/18 1404)  diazepam (VALIUM) tablet 2 mg (2 mg Oral Given 05/21/18 1354)     Initial Impression / Assessment and Plan / ED Course  I have reviewed the triage vital signs and the nursing notes.  Pertinent labs & imaging results that were available during my care of the patient were reviewed by me and considered in my medical decision making (see chart for details).     Patient admitted for UTI.  She did not have improvement in her vital signs.  Patient agreed to admission at first however declined as she was feeling much better and did not wish to be admitted.  Dr. Toniann Fail saw the patient who then declined the admission.  Patient will be discharged home with Keflex, Dr. Fayrene Fearing has written her for her Valium and lyrica.  Discussed return precautions with the patient.  Final Clinical Impressions(s) / ED Diagnoses   Final diagnoses:  Acute cystitis without hematuria  Suprapubic catheter [facility])  Autonomic dysreflexia    ED Discharge Orders    None       Arthor Captain, PA-C 05/21/18 2246    Rolland Porter, MD 05/24/18 1840

## 2018-05-21 NOTE — ED Triage Notes (Signed)
Patient BIB EMS from home with complaints of back pain from lumbar spine with radiation down bilateral legs. Patient denies injury/truama to her back and legs. Patient also complains of nausea x1 week and vomiting today. Patient was seen at [facility] 2 days ago and was told to come back if she felt worse. Patient reports she has history of autonomic dysreflexia and spinal injury with paraplegia.

## 2018-05-24 ENCOUNTER — Encounter

## 2018-05-24 ENCOUNTER — Ambulatory Visit (INDEPENDENT_AMBULATORY_CARE_PROVIDER_SITE_OTHER): Payer: Medicare Other | Admitting: Neurology

## 2018-05-24 ENCOUNTER — Encounter: Payer: Self-pay | Admitting: Neurology

## 2018-05-24 VITALS — BP 89/66 | HR 121 | Ht <= 58 in

## 2018-05-24 DIAGNOSIS — S14109D Unspecified injury at unspecified level of cervical spinal cord, subsequent encounter: Secondary | ICD-10-CM

## 2018-05-24 LAB — URINE CULTURE

## 2018-05-24 MED ORDER — DIAZEPAM 5 MG PO TABS: 5.0000 mg | ORAL_TABLET | Freq: Three times a day (TID) | ORAL | 3 refills | Status: DC

## 2018-05-24 MED ORDER — PREGABALIN 75 MG PO CAPS: 75.0000 mg | ORAL_CAPSULE | Freq: Three times a day (TID) | ORAL | 1 refills | Status: DC

## 2018-05-24 NOTE — Patient Instructions (Signed)
Start diazepam 5 mg twice a day for 3 weeks, then start 5 mg three times a day.
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
## 2018-05-25 ENCOUNTER — Telehealth: Payer: Self-pay | Admitting: *Deleted

## 2018-05-25 ENCOUNTER — Ambulatory Visit: Payer: Medicare Other | Admitting: Internal Medicine

## 2018-05-25 NOTE — Telephone Encounter (Signed)
Post ED Visit - Positive Culture Follow-up: Unsuccessful Patient Follow-up  Culture assessed and recommendations reviewed by:  []  Enzo BiNathan Batchelder, Pharm.D. []  Celedonio MiyamotoJeremy Frens, Pharm.D., BCPS AQ-ID []  Garvin FilaMike Maccia, Pharm.D., BCPS []  Georgina PillionElizabeth Martin, Pharm.D., BCPS []  FairmountMinh Pham, [address]Pharm[address]D., BCPS, AAHIVP []  Estella HuskMichelle Turner, Pharm.D., BCPS, AAHIVP []  Sherlynn CarbonAustin Lucas, PharmD []  Pollyann SamplesAndy Johnston, PharmD, BCPS [facility] Baggett, Pharm D  Positive urine culture  []  Patient discharged without antimicrobial prescription and treatment is now indicated [x]  Organism is resistant to prescribed ED discharge antimicrobial, stop Cephalexin and start Bactrim DS BID x 5 days []  Patient with positive blood cultures   Unable to contact patient after 3 attempts, letter will be sent to address on file  Lysle PearlRobertson, Darlina Mccaughey Talley 05/25/2018, 10:06 AM

## 2018-05-25 NOTE — Telephone Encounter (Signed)
Post ED Visit - Positive Culture Follow-up: Successful Patient Follow-Up  Culture assessed and recommendations reviewed by: []  Enzo BiNathan Batchelder, Pharm.D. []  Celedonio MiyamotoJeremy Frens, Pharm.D., BCPS AQ-ID []  Garvin FilaMike Maccia, Pharm.D., BCPS []  Georgina PillionElizabeth Martin, Pharm.D., BCPS []  [place] YorkMinh Pham, [address]Pharm[address]D., BCPS, AAHIVP []  Estella HuskMichelle Turner, Pharm.D., BCPS, AAHIVP []  Lysle Pearlachel Rumbarger, PharmD, BCPS []  Sherlynn CarbonAustin Lucas, PharmD []  Pollyann SamplesAndy Johnston, PharmD, BCPS Ladell PierBrooke Baggett, PharmD  Positive urine culture  []  Patient discharged without antimicrobial prescription and treatment is now indicated [x]  Organism is resistant to prescribed ED discharge antimicrobial []  Patient with positive blood cultures  Changes discussed with ED provider: Buel ReamAlexandra Law, PA-C New antibiotic prescription Bactrim DS 1 tab PO BID x [facility] [address]  [phone]  Contacted patient, date 05/25/2018, time 1455   Lysle PearlRobertson, Jesusmanuel Erbes Talley 05/25/2018, 2:53 PM

## 2018-05-25 NOTE — Progress Notes (Signed)
ED Antimicrobial Stewardship Positive Culture Follow Up   Stacy CraftBritany D Carlson is an 32 y.o. female who [facility] with a chief complaint of  Chief Complaint  Patient presents with  . Back Pain  . Nausea    Recent Results (from the past 720 hour(s))  Urine Culture     Status: Abnormal   Collection Time: 05/21/18  2:31 PM  Result Value Ref Range Status   Specimen Description   Final    URINE, RANDOM Performed at [facility], [address] [place], [place] [address]    Special Requests   Final    NONE Performed at [facility], [address] [place], [place] [address]    Culture (A)  Final    >=100,000 COLONIES/mL DIPHTHEROIDS(CORYNEBACTERIUM SPECIES) >=100,000 COLONIES/mL STAPHYLOCOCCUS EPIDERMIDIS    Report Status 05/24/2018 FINAL  Final   Organism ID, Bacteria STAPHYLOCOCCUS EPIDERMIDIS (A)  Final      Susceptibility   Staphylococcus epidermidis - MIC*    CIPROFLOXACIN 4 RESISTANT Resistant     GENTAMICIN <=0.5 SENSITIVE Sensitive     NITROFURANTOIN <=16 SENSITIVE Sensitive     OXACILLIN >=4 RESISTANT Resistant     TETRACYCLINE 2 SENSITIVE Sensitive     VANCOMYCIN 1 SENSITIVE Sensitive     TRIMETH/SULFA <=10 SENSITIVE Sensitive     CLINDAMYCIN RESISTANT Resistant     RIFAMPIN <=0.5 SENSITIVE Sensitive     Inducible Clindamycin POSITIVE Resistant     * >=100,000 COLONIES/mL STAPHYLOCOCCUS EPIDERMIDIS    [x]  Treated with cephalexin, organism resistant to prescribed antimicrobial  New antibiotic prescription: Bactrim DS 1 tablet BID x 5 days  ED Provider: Buel ReamAlexandra Law, PA-C   [name] 05/25/2018, 9:18 AM Infectious Diseases Pharmacist Phone# [phone]

## 2018-06-07 ENCOUNTER — Encounter (HOSPITAL_BASED_OUTPATIENT_CLINIC_OR_DEPARTMENT_OTHER): Payer: Medicare Other | Attending: Internal Medicine

## 2018-06-07 DIAGNOSIS — L89314 Pressure ulcer of right buttock, stage 4: Secondary | ICD-10-CM | POA: Diagnosis not present

## 2018-06-07 DIAGNOSIS — L89220 Pressure ulcer of left hip, unstageable: Secondary | ICD-10-CM | POA: Insufficient documentation

## 2018-06-07 DIAGNOSIS — L89324 Pressure ulcer of left buttock, stage 4: Secondary | ICD-10-CM | POA: Insufficient documentation

## 2018-06-07 DIAGNOSIS — L89322 Pressure ulcer of left buttock, stage 2: Secondary | ICD-10-CM | POA: Diagnosis not present

## 2018-06-07 DIAGNOSIS — L89152 Pressure ulcer of sacral region, stage 2: Secondary | ICD-10-CM | POA: Diagnosis not present

## 2018-06-07 DIAGNOSIS — G8254 Quadriplegia, C5-C7 incomplete: Secondary | ICD-10-CM | POA: Diagnosis not present

## 2018-06-08 ENCOUNTER — Other Ambulatory Visit (HOSPITAL_COMMUNITY)
Admit: 2018-06-08 | Discharge: 2018-06-08 | Disposition: A | Discharge status: Home / Self Care | Payer: Medicare Other | Source: Ambulatory Visit | Attending: Internal Medicine | Admitting: Internal Medicine

## 2018-06-08 DIAGNOSIS — L89229 Pressure ulcer of left hip, unspecified stage: Secondary | ICD-10-CM | POA: Diagnosis present

## 2018-06-12 LAB — AEROBIC CULTURE W GRAM STAIN (SUPERFICIAL SPECIMEN)

## 2018-06-12 LAB — AEROBIC CULTURE  (SUPERFICIAL SPECIMEN)

## 2018-06-14 DIAGNOSIS — L89314 Pressure ulcer of right buttock, stage 4: Secondary | ICD-10-CM | POA: Diagnosis not present

## 2018-06-21 ENCOUNTER — Encounter (HOSPITAL_BASED_OUTPATIENT_CLINIC_OR_DEPARTMENT_OTHER): Payer: Medicare Other | Attending: Internal Medicine

## 2018-06-21 DIAGNOSIS — X58XXXA Exposure to other specified factors, initial encounter: Secondary | ICD-10-CM | POA: Insufficient documentation

## 2018-06-21 DIAGNOSIS — L89324 Pressure ulcer of left buttock, stage 4: Secondary | ICD-10-CM | POA: Insufficient documentation

## 2018-06-21 DIAGNOSIS — S3140XA Unspecified open wound of vagina and vulva, initial encounter: Secondary | ICD-10-CM | POA: Insufficient documentation

## 2018-06-21 DIAGNOSIS — L89224 Pressure ulcer of left hip, stage 4: Secondary | ICD-10-CM | POA: Insufficient documentation

## 2018-06-21 DIAGNOSIS — L89314 Pressure ulcer of right buttock, stage 4: Secondary | ICD-10-CM | POA: Insufficient documentation

## 2018-06-21 DIAGNOSIS — L89152 Pressure ulcer of sacral region, stage 2: Secondary | ICD-10-CM | POA: Insufficient documentation

## 2018-07-05 DIAGNOSIS — X58XXXA Exposure to other specified factors, initial encounter: Secondary | ICD-10-CM | POA: Diagnosis not present

## 2018-07-05 DIAGNOSIS — L89314 Pressure ulcer of right buttock, stage 4: Secondary | ICD-10-CM | POA: Diagnosis not present

## 2018-07-05 DIAGNOSIS — L89152 Pressure ulcer of sacral region, stage 2: Secondary | ICD-10-CM | POA: Diagnosis not present

## 2018-07-05 DIAGNOSIS — L89324 Pressure ulcer of left buttock, stage 4: Secondary | ICD-10-CM | POA: Diagnosis not present

## 2018-07-05 DIAGNOSIS — L89224 Pressure ulcer of left hip, stage 4: Secondary | ICD-10-CM | POA: Diagnosis not present

## 2018-07-05 DIAGNOSIS — S3140XA Unspecified open wound of vagina and vulva, initial encounter: Secondary | ICD-10-CM | POA: Diagnosis not present

## 2018-07-09 ENCOUNTER — Other Ambulatory Visit: Payer: Self-pay

## 2018-07-09 ENCOUNTER — Encounter (HOSPITAL_COMMUNITY): Payer: Self-pay | Admitting: Emergency Medicine

## 2018-07-09 ENCOUNTER — Inpatient Hospital Stay (HOSPITAL_COMMUNITY)
Admission: EM | Admit: 2018-07-09 | Discharge: 2018-07-15 | DRG: 698 | Disposition: A | Discharge status: Home Health | Payer: Medicare Other | Attending: Family Medicine | Admitting: Family Medicine

## 2018-07-09 ENCOUNTER — Emergency Department (HOSPITAL_COMMUNITY): Payer: Medicare Other

## 2018-07-09 DIAGNOSIS — Q600 Renal agenesis, unilateral: Secondary | ICD-10-CM | POA: Diagnosis not present

## 2018-07-09 DIAGNOSIS — M869 Osteomyelitis, unspecified: Secondary | ICD-10-CM | POA: Diagnosis present

## 2018-07-09 DIAGNOSIS — L89154 Pressure ulcer of sacral region, stage 4: Secondary | ICD-10-CM | POA: Diagnosis present

## 2018-07-09 DIAGNOSIS — L89329 Pressure ulcer of left buttock, unspecified stage: Secondary | ICD-10-CM | POA: Diagnosis not present

## 2018-07-09 DIAGNOSIS — N179 Acute kidney failure, unspecified: Secondary | ICD-10-CM | POA: Diagnosis present

## 2018-07-09 DIAGNOSIS — Y846 Urinary catheterization as the cause of abnormal reaction of the patient, or of later complication, without mention of misadventure at the time of the procedure: Secondary | ICD-10-CM | POA: Diagnosis present

## 2018-07-09 DIAGNOSIS — I9589 Other hypotension: Secondary | ICD-10-CM

## 2018-07-09 DIAGNOSIS — S14109S Unspecified injury at unspecified level of cervical spinal cord, sequela: Secondary | ICD-10-CM | POA: Diagnosis not present

## 2018-07-09 DIAGNOSIS — J45909 Unspecified asthma, uncomplicated: Secondary | ICD-10-CM | POA: Diagnosis present

## 2018-07-09 DIAGNOSIS — L8990 Pressure ulcer of unspecified site, unspecified stage: Secondary | ICD-10-CM | POA: Diagnosis not present

## 2018-07-09 DIAGNOSIS — L8944 Pressure ulcer of contiguous site of back, buttock and hip, stage 4: Secondary | ICD-10-CM | POA: Diagnosis not present

## 2018-07-09 DIAGNOSIS — Z681 Body mass index (BMI) 19 or less, adult: Secondary | ICD-10-CM | POA: Diagnosis not present

## 2018-07-09 DIAGNOSIS — S14109A Unspecified injury at unspecified level of cervical spinal cord, initial encounter: Secondary | ICD-10-CM | POA: Diagnosis present

## 2018-07-09 DIAGNOSIS — Z833 Family history of diabetes mellitus: Secondary | ICD-10-CM | POA: Diagnosis not present

## 2018-07-09 DIAGNOSIS — L8993 Pressure ulcer of unspecified site, stage 3: Secondary | ICD-10-CM | POA: Diagnosis not present

## 2018-07-09 DIAGNOSIS — N3281 Overactive bladder: Secondary | ICD-10-CM | POA: Diagnosis present

## 2018-07-09 DIAGNOSIS — G8929 Other chronic pain: Secondary | ICD-10-CM | POA: Diagnosis present

## 2018-07-09 DIAGNOSIS — F419 Anxiety disorder, unspecified: Secondary | ICD-10-CM | POA: Diagnosis present

## 2018-07-09 DIAGNOSIS — M86251 Subacute osteomyelitis, right femur: Secondary | ICD-10-CM | POA: Diagnosis not present

## 2018-07-09 DIAGNOSIS — L89324 Pressure ulcer of left buttock, stage 4: Secondary | ICD-10-CM | POA: Diagnosis present

## 2018-07-09 DIAGNOSIS — R110 Nausea: Secondary | ICD-10-CM | POA: Diagnosis not present

## 2018-07-09 DIAGNOSIS — M2450 Contracture, unspecified joint: Secondary | ICD-10-CM | POA: Diagnosis present

## 2018-07-09 DIAGNOSIS — T83511D Infection and inflammatory reaction due to indwelling urethral catheter, subsequent encounter: Secondary | ICD-10-CM | POA: Diagnosis not present

## 2018-07-09 DIAGNOSIS — M8618 Other acute osteomyelitis, other site: Secondary | ICD-10-CM | POA: Diagnosis not present

## 2018-07-09 DIAGNOSIS — L02416 Cutaneous abscess of left lower limb: Secondary | ICD-10-CM | POA: Diagnosis not present

## 2018-07-09 DIAGNOSIS — T83518A Infection and inflammatory reaction due to other urinary catheter, initial encounter: Principal | ICD-10-CM | POA: Diagnosis present

## 2018-07-09 DIAGNOSIS — D649 Anemia, unspecified: Secondary | ICD-10-CM | POA: Diagnosis present

## 2018-07-09 DIAGNOSIS — Z823 Family history of stroke: Secondary | ICD-10-CM

## 2018-07-09 DIAGNOSIS — Z881 Allergy status to other antibiotic agents status: Secondary | ICD-10-CM | POA: Diagnosis not present

## 2018-07-09 DIAGNOSIS — K219 Gastro-esophageal reflux disease without esophagitis: Secondary | ICD-10-CM | POA: Diagnosis present

## 2018-07-09 DIAGNOSIS — L03115 Cellulitis of right lower limb: Secondary | ICD-10-CM | POA: Diagnosis present

## 2018-07-09 DIAGNOSIS — E46 Unspecified protein-calorie malnutrition: Secondary | ICD-10-CM | POA: Diagnosis present

## 2018-07-09 DIAGNOSIS — T83511A Infection and inflammatory reaction due to indwelling urethral catheter, initial encounter: Secondary | ICD-10-CM | POA: Diagnosis present

## 2018-07-09 DIAGNOSIS — L89159 Pressure ulcer of sacral region, unspecified stage: Secondary | ICD-10-CM | POA: Diagnosis not present

## 2018-07-09 DIAGNOSIS — Z85820 Personal history of malignant melanoma of skin: Secondary | ICD-10-CM

## 2018-07-09 DIAGNOSIS — G909 Disorder of the autonomic nervous system, unspecified: Secondary | ICD-10-CM | POA: Diagnosis not present

## 2018-07-09 DIAGNOSIS — B9620 Unspecified Escherichia coli [E. coli] as the cause of diseases classified elsewhere: Secondary | ICD-10-CM | POA: Diagnosis not present

## 2018-07-09 DIAGNOSIS — Z8619 Personal history of other infectious and parasitic diseases: Secondary | ICD-10-CM | POA: Diagnosis not present

## 2018-07-09 DIAGNOSIS — G8254 Quadriplegia, C5-C7 incomplete: Secondary | ICD-10-CM | POA: Diagnosis not present

## 2018-07-09 DIAGNOSIS — G8250 Quadriplegia, unspecified: Secondary | ICD-10-CM | POA: Diagnosis present

## 2018-07-09 DIAGNOSIS — N390 Urinary tract infection, site not specified: Secondary | ICD-10-CM | POA: Diagnosis present

## 2018-07-09 DIAGNOSIS — M6250 Muscle wasting and atrophy, not elsewhere classified, unspecified site: Secondary | ICD-10-CM | POA: Diagnosis not present

## 2018-07-09 DIAGNOSIS — L89229 Pressure ulcer of left hip, unspecified stage: Secondary | ICD-10-CM | POA: Diagnosis not present

## 2018-07-09 DIAGNOSIS — Z1612 Extended spectrum beta lactamase (ESBL) resistance: Secondary | ICD-10-CM | POA: Diagnosis not present

## 2018-07-09 DIAGNOSIS — L89214 Pressure ulcer of right hip, stage 4: Secondary | ICD-10-CM | POA: Diagnosis present

## 2018-07-09 DIAGNOSIS — Z87440 Personal history of urinary (tract) infections: Secondary | ICD-10-CM | POA: Diagnosis not present

## 2018-07-09 DIAGNOSIS — K5900 Constipation, unspecified: Secondary | ICD-10-CM | POA: Diagnosis present

## 2018-07-09 DIAGNOSIS — Z452 Encounter for adjustment and management of vascular access device: Secondary | ICD-10-CM

## 2018-07-09 DIAGNOSIS — L89899 Pressure ulcer of other site, unspecified stage: Secondary | ICD-10-CM | POA: Diagnosis not present

## 2018-07-09 DIAGNOSIS — A419 Sepsis, unspecified organism: Secondary | ICD-10-CM

## 2018-07-09 DIAGNOSIS — Z8249 Family history of ischemic heart disease and other diseases of the circulatory system: Secondary | ICD-10-CM | POA: Diagnosis not present

## 2018-07-09 HISTORY — DX: Major depressive disorder, recurrent, unspecified: F33.9

## 2018-07-09 HISTORY — DX: Osteomyelitis, unspecified: M86.9

## 2018-07-09 HISTORY — DX: Sepsis, unspecified organism: A41.9

## 2018-07-09 HISTORY — DX: Pressure ulcer of unspecified site, unspecified stage: L89.90

## 2018-07-09 HISTORY — DX: Diffuse otitis externa, bilateral: H60.313

## 2018-07-09 HISTORY — DX: Contracture, unspecified joint: M24.50

## 2018-07-09 HISTORY — DX: Other injury of unspecified body region, initial encounter: T14.8XXA

## 2018-07-09 HISTORY — DX: Iron deficiency anemia, unspecified: D50.9

## 2018-07-09 HISTORY — DX: Muscle wasting and atrophy, not elsewhere classified, unspecified site: M62.50

## 2018-07-09 HISTORY — DX: Infection and inflammatory reaction due to indwelling urethral catheter, initial encounter: T83.511A

## 2018-07-09 HISTORY — DX: Personal history of malignant melanoma of skin: Z85.820

## 2018-07-09 HISTORY — DX: Juvenile idiopathic scoliosis, thoracolumbar region: M41.115

## 2018-07-09 HISTORY — DX: Local infection of the skin and subcutaneous tissue, unspecified: L08.9

## 2018-07-09 HISTORY — DX: Congenital talipes equinovarus, right foot: Q66.01

## 2018-07-09 HISTORY — DX: Other psoriasis: L40.8

## 2018-07-09 HISTORY — DX: Oligomenorrhea, unspecified: N91.5

## 2018-07-09 HISTORY — DX: Urinary tract infection, site not specified: N39.0

## 2018-07-09 HISTORY — DX: Quadriplegia, unspecified: G82.50

## 2018-07-09 HISTORY — DX: Congenital talipes equinovarus, left foot: Q66.02

## 2018-07-09 HISTORY — DX: Spondylosis without myelopathy or radiculopathy, cervical region: M47.812

## 2018-07-09 HISTORY — DX: Pain in leg, unspecified: M79.606

## 2018-07-09 HISTORY — DX: Presence of other vascular implants and grafts: Z95.828

## 2018-07-09 LAB — COMPREHENSIVE METABOLIC PANEL
ALT: 12 U/L (ref 0–44)
AST: 26 U/L (ref 15–41)
Albumin: 3 g/dL — ABNORMAL LOW (ref 3.5–5.0)
Alkaline Phosphatase: 56 U/L (ref 38–126)
Anion gap: 10 (ref 5–15)
BUN: 9 mg/dL (ref 6–20)
CHLORIDE: 101 mmol/L (ref 98–111)
CO2: 24 mmol/L (ref 22–32)
Calcium: 7.8 mg/dL — ABNORMAL LOW (ref 8.9–10.3)
Creatinine, Ser: 0.82 mg/dL (ref 0.44–1.00)
Glucose, Bld: 127 mg/dL — ABNORMAL HIGH (ref 70–99)
POTASSIUM: 3.6 mmol/L (ref 3.5–5.1)
Sodium: 135 mmol/L (ref 135–145)
Total Bilirubin: 0.9 mg/dL (ref 0.3–1.2)
Total Protein: 7.6 g/dL (ref 6.5–8.1)

## 2018-07-09 LAB — CBC WITH DIFFERENTIAL/PLATELET
Basophils Absolute: 0 10*3/uL (ref 0.0–0.1)
Basophils Relative: 0 %
EOS PCT: 0 %
Eosinophils Absolute: 0 10*3/uL (ref 0.0–0.7)
HEMATOCRIT: 31 % — AB (ref 36.0–46.0)
HEMOGLOBIN: 9.1 g/dL — AB (ref 12.0–15.0)
Lymphocytes Relative: 8 %
Lymphs Abs: 0.8 10*3/uL (ref 0.7–4.0)
MCH: 22.2 pg — ABNORMAL LOW (ref 26.0–34.0)
MCHC: 29.4 g/dL — ABNORMAL LOW (ref 30.0–36.0)
MCV: 75.6 fL — AB (ref 78.0–100.0)
MONOS PCT: 3 %
Monocytes Absolute: 0.3 10*3/uL (ref 0.1–1.0)
NEUTROS PCT: 89 %
Neutro Abs: 8.5 10*3/uL — ABNORMAL HIGH (ref 1.7–7.7)
Platelets: 202 10*3/uL (ref 150–400)
RBC: 4.1 MIL/uL (ref 3.87–5.11)
RDW: 17.6 % — ABNORMAL HIGH (ref 11.5–15.5)
WBC: 9.6 10*3/uL (ref 4.0–10.5)

## 2018-07-09 LAB — MRSA PCR SCREENING: MRSA BY PCR: NEGATIVE

## 2018-07-09 LAB — URINALYSIS, ROUTINE W REFLEX MICROSCOPIC
Bilirubin Urine: NEGATIVE
GLUCOSE, UA: NEGATIVE mg/dL
KETONES UR: NEGATIVE mg/dL
Nitrite: NEGATIVE
PROTEIN: 100 mg/dL — AB
Specific Gravity, Urine: 1.021 (ref 1.005–1.030)
pH: 5 (ref 5.0–8.0)

## 2018-07-09 LAB — I-STAT CG4 LACTIC ACID, ED
LACTIC ACID, VENOUS: 1.9 mmol/L (ref 0.5–1.9)
Lactic Acid, Venous: 0.49 mmol/L — ABNORMAL LOW (ref 0.5–1.9)

## 2018-07-09 LAB — PROTIME-INR
INR: 1.52
PROTHROMBIN TIME: 18.2 s — AB (ref 11.4–15.2)

## 2018-07-09 LAB — APTT: APTT: 34 s (ref 24–36)

## 2018-07-09 LAB — PROCALCITONIN: PROCALCITONIN: 4.89 ng/mL

## 2018-07-09 MED ORDER — ALBUTEROL SULFATE (2.5 MG/3ML) 0.083% IN NEBU
2.5000 mg | INHALATION_SOLUTION | RESPIRATORY_TRACT | Status: DC | PRN
  Administered 2018-07-11: 2.5 mg via RESPIRATORY_TRACT
  Filled 2018-07-09: qty 3

## 2018-07-09 MED ORDER — SODIUM CHLORIDE 0.9 % IV SOLN
Freq: Once | INTRAVENOUS | Status: AC
  Administered 2018-07-09: 13:00:00 via INTRAVENOUS

## 2018-07-09 MED ORDER — CLOBETASOL PROPIONATE 0.05 % EX SOLN: 1.0000 "application " | CUTANEOUS | Status: DC

## 2018-07-09 MED ORDER — SODIUM CHLORIDE 0.9 % IV SOLN
2.0000 g | INTRAVENOUS | Status: DC
  Administered 2018-07-09: 2 g via INTRAVENOUS
  Filled 2018-07-09: qty 20

## 2018-07-09 MED ORDER — SODIUM CHLORIDE 0.9 % IV BOLUS
1000.0000 mL | Freq: Once | INTRAVENOUS | Status: AC
  Administered 2018-07-09: 1000 mL via INTRAVENOUS

## 2018-07-09 MED ORDER — ACETAMINOPHEN 325 MG PO TABS
650.0000 mg | ORAL_TABLET | Freq: Four times a day (QID) | ORAL | Status: DC
  Administered 2018-07-09 – 2018-07-14 (×13): 650 mg via ORAL
  Filled 2018-07-09 (×13): qty 2

## 2018-07-09 MED ORDER — HYDROMORPHONE HCL 1 MG/ML IJ SOLN
0.5000 mg | Freq: Once | INTRAMUSCULAR | Status: AC
  Administered 2018-07-09: 0.5 mg via INTRAVENOUS
  Filled 2018-07-09: qty 1

## 2018-07-09 MED ORDER — ENOXAPARIN SODIUM 40 MG/0.4ML ~~LOC~~ SOLN: 40.0000 mg | SUBCUTANEOUS | Status: DC

## 2018-07-09 MED ORDER — DIAZEPAM 5 MG PO TABS
2.5000 mg | ORAL_TABLET | Freq: Three times a day (TID) | ORAL | Status: DC
  Administered 2018-07-09 – 2018-07-14 (×17): 2.5 mg via ORAL
  Filled 2018-07-09 (×17): qty 1

## 2018-07-09 MED ORDER — ACETAMINOPHEN 650 MG RE SUPP: 650.0000 mg | Freq: Four times a day (QID) | RECTAL | Status: DC

## 2018-07-09 MED ORDER — PREGABALIN 50 MG PO CAPS
75.0000 mg | ORAL_CAPSULE | Freq: Three times a day (TID) | ORAL | Status: DC
  Administered 2018-07-09 – 2018-07-14 (×17): 75 mg via ORAL
  Filled 2018-07-09 (×17): qty 1

## 2018-07-09 MED ORDER — HYDROMORPHONE HCL 2 MG PO TABS
2.0000 mg | ORAL_TABLET | Freq: Four times a day (QID) | ORAL | Status: DC | PRN
  Administered 2018-07-09 – 2018-07-14 (×14): 2 mg via ORAL
  Filled 2018-07-09 (×15): qty 1

## 2018-07-09 MED ORDER — DARIFENACIN HYDROBROMIDE ER 15 MG PO TB24
15.0000 mg | ORAL_TABLET | Freq: Every day | ORAL | Status: DC
  Administered 2018-07-10 – 2018-07-14 (×5): 15 mg via ORAL
  Filled 2018-07-09 (×5): qty 1

## 2018-07-09 MED ORDER — SODIUM CHLORIDE 0.9 % IV SOLN
1.0000 g | Freq: Three times a day (TID) | INTRAVENOUS | Status: DC
  Administered 2018-07-09 – 2018-07-13 (×12): 1 g via INTRAVENOUS
  Filled 2018-07-09 (×13): qty 1

## 2018-07-09 MED ORDER — ONDANSETRON HCL 4 MG/2ML IJ SOLN
4.0000 mg | Freq: Once | INTRAMUSCULAR | Status: AC
  Administered 2018-07-09: 4 mg via INTRAVENOUS
  Filled 2018-07-09: qty 2

## 2018-07-09 NOTE — ED Notes (Signed)
MD at bedside. 
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
## 2018-07-09 NOTE — ED Notes (Signed)
Admitting at bedside 

## 2018-07-09 NOTE — ED Provider Notes (Addendum)
[facility] EMERGENCY DEPARTMENT Provider Note   CSN: [id number] Arrival date & time: 07/09/18  1143     History   Chief Complaint Chief Complaint  Patient presents with  . Fever    HPI [name] is a 32 y.o. female.  Patient with remote hx spinal cord injury, c/o fevers and generalized weakness. Symptoms present for past 1-2 days. Symptoms moderate-sev, persistent, waxing and waning, without specific exacerbating or alleviating factors. States has suprapubic cath, recently changed, and that urine looks cloudy. No flank pain. Nausea. No vomiting. No abd pain. Denies cough or uri symptoms. No headaches. Has sacral decubitus but states area recently has been looking good.   The history is provided by the patient and the EMS personnel.  Fever   Pertinent negatives include no chest pain, no headaches, no sore throat and no cough.    Past Medical History:  Diagnosis Date  . Abnormal Pap smear   . Anemia   . ASCUS (atypical squamous cells of undetermined significance) on Pap smear 10/2011  . Asthma Mild, only with exposure to cigarette smoke  . Autonomic dysreflexia   . Chronic pain 2005  . Congenital absence of right kidney   . Decubitus ulcer of buttock, stage 2    bilateral-nondraining-redressed q other day  . Decubitus ulcer of coccygeal region, stage 1   . H/O scoliosis   . Horseshoe kidney   . Hx: UTI (urinary tract infection) 11/27/11  . Quadriplegia following spinal cord injury (HCC)   . Recurrent nephrolithiasis    s/p stent  . Recurrent UTI requires self catheterization  . Sciatica   . Spinal cord injury, cervical region (HCC) 05/19/2004   movement,sensation intact-unable to stand/ transfer since 2017 after septic shock episode per pt  . Urinary tract infection 03/2017    Patient Active Problem List   Diagnosis Date Noted  . Lower extremity pain 05/21/2018  . Recurrent UTI   . Quadriplegia following spinal cord injury (HCC)   .  Wound infection 01/19/2018  . Microcytic hypochromic anemia 01/19/2018  . Sepsis (HCC) 01/19/2018  . Pressure injury of skin 01/19/2018  . Infected decubitus ulcer, unspecified pressure ulcer stage   . Oligomenorrhea 05/19/2012  . Spinal cord injury, cervical region [facility]) 05/19/2004    Past Surgical History:  Procedure Laterality Date  . BACK SURGERY  Scoliosis, Harrington Rods in place  . Bilateral leg surgery    . C3-6 spine surgery with rods    . dvt filter placement  2005  . ESOPHAGOGASTRODUODENOSCOPY (EGD) WITH PROPOFOL N/A 01/07/2018   Procedure: ESOPHAGOGASTRODUODENOSCOPY (EGD) WITH PROPOFOL;  Surgeon: Kathi Der, MD;  Location: [facility];  Service: Gastroenterology;  Laterality: N/A;  . INSERTION OF SUPRAPUBIC CATHETER N/A 05/15/2017   Procedure: INSERTION OF SUPRAPUBIC CATHETER;  Surgeon: Ihor Gully, MD;  Location: [facility];  Service: Urology;  Laterality: N/A;  . LITHOTRIPSY    . ORTHOPEDIC SURGERY  Multiple lower extremity surgeries as a child  . VASCULAR SURGERY  IVC filter placed in 2005     OB History    Gravida  0   Para      Term      Preterm      AB      Living        SAB      TAB      Ectopic      Multiple      Live Births  Home Medications    Prior to Admission medications   Medication Sig Start Date End Date Taking? Authorizing Provider  albuterol (PROVENTIL HFA;VENTOLIN HFA) 108 (90 BASE) MCG/ACT inhaler Inhale 2 puffs into the lungs every 4 (four) hours as needed for wheezing or shortness of breath.     [provider]  cephALEXin (KEFLEX) 500 MG capsule 2 caps po bid x 7 days 05/21/18   Arthor CaptainHarris, Abigail, PA-C  diazepam (VALIUM) 5 MG tablet Take 1 tablet (5 mg total) by mouth 3 (three) times daily. 05/24/18   York SpanielWillis, Charles K, MD  HYDROmorphone (DILAUDID) 4 MG tablet Take 4 mg by mouth every 6 (six) hours as needed for pain. 04/08/18   [provider]  ibuprofen (ADVIL,MOTRIN)  200 MG tablet Take 400 mg by mouth every 6 (six) hours as needed for headache or moderate pain.    [provider]  ketoconazole (NIZORAL) 2 % cream Apply 1 application topically daily as needed for dry skin. 04/28/18   [provider]  mometasone (ELOCON) 0.1 % cream Apply 1 application topically daily as needed for dry skin. 04/28/18   [provider]  POLY-IRON 150 150 MG capsule Take 1 capsule by mouth daily. 04/21/18   [provider]  pregabalin (LYRICA) 75 MG capsule Take 1 capsule (75 mg total) by mouth 3 (three) times daily. 05/24/18   York SpanielWillis, Charles K, MD  solifenacin (VESICARE) 10 MG tablet Take 10 mg by mouth every morning.     [provider]  norethindrone (MICRONOR,CAMILA,ERRIN) 0.35 MG tablet Take 1 tablet by mouth daily.  03/10/12  [provider]    Family History Family History  Problem Relation Age of Onset  . Heart disease Mother        Massive MI in 2006 ; died  . Fibromyalgia Mother   . Heart attack Father   . Other Father        Stomach ulcers  . Cancer Maternal Grandmother        Spine to brain  . Diabetes Maternal Grandmother   . Lung cancer Maternal Grandfather   . Stroke Maternal Grandfather     Social History Social History   Tobacco Use  . Smoking status: Never Smoker  . Smokeless tobacco: Never Used  Substance Use Topics  . Alcohol use: No  . Drug use: No     Allergies   Metoclopramide hcl; Benadryl [diphenhydramine hcl]; Gabapentin; Morphine; Vancomycin; and Zosyn [piperacillin-tazobactam in dex]   Review of Systems Review of Systems  Constitutional: Positive for fever.  HENT: Negative for sore throat.   Eyes: Negative for redness.  Respiratory: Negative for cough and shortness of breath.   Cardiovascular: Negative for chest pain.  Gastrointestinal: Positive for nausea. Negative for abdominal pain.  Endocrine: Negative for polyuria.  Genitourinary: Negative for dysuria and flank pain.        +cloudy urine.   Musculoskeletal: Negative for neck pain and neck stiffness.  Skin: Negative for rash.  Neurological: Negative for headaches.  Hematological: Does not bruise/bleed easily.  Psychiatric/Behavioral: Negative for confusion.     Physical Exam Updated Vital Signs BP (!) 97/40 (BP Location: Right Arm)   Pulse (!) 113   Resp (!) 22   SpO2 100%   Physical Exam  Constitutional: She appears well-developed and well-nourished.  HENT:  Mouth/Throat: Oropharynx is clear and moist.  Eyes: Conjunctivae are normal. No scleral icterus.  Neck: Neck supple. No tracheal deviation present.  Cardiovascular: Normal rate, regular rhythm, normal  heart sounds and intact distal pulses. Exam reveals no gallop and no friction rub.  No murmur heard. Pulmonary/Chest: Effort normal and breath sounds normal. No respiratory distress.  Abdominal: Soft. Normal appearance and bowel sounds are normal. She exhibits no distension and no mass. There is no tenderness. There is no guarding.  Suprapubic cath site without sign of infection  Genitourinary:  Genitourinary Comments: No cva tenderness. +suprapubic cath/urine cloudy.   Musculoskeletal: She exhibits no edema.  Neurological: She is alert.  Skin: Skin is warm and dry. No rash noted.  Sacral pressure sore, superficial, no purulent drainage, malodor, or cellulitis.   Psychiatric: She has a normal mood and affect.  Nursing note and vitals reviewed.    ED Treatments / Results  Labs (all labs ordered are listed, but only abnormal results are displayed) Results for orders placed or performed during the hospital encounter of 07/09/18  Comprehensive metabolic panel  Result Value Ref Range   Sodium 135 135 - 145 mmol/L   Potassium 3.6 3.5 - 5.1 mmol/L   Chloride 101 98 - 111 mmol/L   CO2 24 22 - 32 mmol/L   Glucose, Bld 127 (H) 70 - 99 mg/dL   BUN 9 6 - 20 mg/dL   Creatinine, Ser 1.61 0.44 - 1.00 mg/dL   Calcium 7.8 (L) 8.9 - 10.3 mg/dL    Total Protein 7.6 6.5 - 8.1 g/dL   Albumin 3.0 (L) 3.5 - 5.0 g/dL   AST 26 15 - 41 U/L   ALT 12 0 - 44 U/L   Alkaline Phosphatase 56 38 - 126 U/L   Total Bilirubin 0.9 0.3 - 1.2 mg/dL   GFR calc non Af Amer >60 >60 mL/min   GFR calc Af Amer >60 >60 mL/min   Anion gap 10 5 - 15  CBC WITH DIFFERENTIAL  Result Value Ref Range   WBC 9.6 4.0 - 10.5 K/uL   RBC 4.10 3.87 - 5.11 MIL/uL   Hemoglobin 9.1 (L) 12.0 - 15.0 g/dL   HCT 09.6 (L) 04.5 - 40.9 %   MCV 75.6 (L) 78.0 - 100.0 fL   MCH 22.2 (L) 26.0 - 34.0 pg   MCHC 29.4 (L) 30.0 - 36.0 g/dL   RDW 81.1 (H) 91.4 - 78.2 %   Platelets 202 150 - 400 K/uL   Neutrophils Relative % 89 %   Lymphocytes Relative 8 %   Monocytes Relative 3 %   Eosinophils Relative 0 %   Basophils Relative 0 %   Neutro Abs 8.5 (H) 1.7 - 7.7 K/uL   Lymphs Abs 0.8 0.7 - 4.0 K/uL   Monocytes Absolute 0.3 0.1 - 1.0 K/uL   Eosinophils Absolute 0.0 0.0 - 0.7 K/uL   Basophils Absolute 0.0 0.0 - 0.1 K/uL   Smear Review MORPHOLOGY UNREMARKABLE   Urinalysis, Routine w reflex microscopic  Result Value Ref Range   Color, Urine AMBER (A) YELLOW   APPearance HAZY (A) CLEAR   Specific Gravity, Urine 1.021 1.005 - 1.030   pH 5.0 5.0 - 8.0   Glucose, UA NEGATIVE NEGATIVE mg/dL   Hgb urine dipstick SMALL (A) NEGATIVE   Bilirubin Urine NEGATIVE NEGATIVE   Ketones, ur NEGATIVE NEGATIVE mg/dL   Protein, ur 956 (A) NEGATIVE mg/dL   Nitrite NEGATIVE NEGATIVE   Leukocytes, UA MODERATE (A) NEGATIVE   RBC / HPF 6-10 0 - 5 RBC/hpf   WBC, UA 11-20 0 - 5 WBC/hpf   Bacteria, UA FEW (A) NONE SEEN   Squamous Epithelial /  LPF 0-5 0 - 5   Mucus PRESENT    Hyaline Casts, UA PRESENT    Non Squamous Epithelial 0-5 (A) NONE SEEN  I-Stat CG4 Lactic Acid, ED  (not at  [facility])  Result Value Ref Range   Lactic Acid, Venous 1.90 0.5 - 1.9 mmol/L   Dg Chest Port 1 View  Result Date: 07/09/2018 CLINICAL DATA:  Fever EXAM: PORTABLE CHEST 1 VIEW COMPARISON:  05/18/2018 FINDINGS: Right  perihilar density similar to prior studies including CT chest 01/24/2011 most compatible with scarring. Negative for pneumonia. No effusion. Cervical and thoracic fusion hardware unchanged. IMPRESSION: Right perihilar scarring.  Negative for pneumonia. Electronically Signed   By: Marlan Palau M.D.   On: 07/09/2018 12:51    EKG EKG Interpretation  Date/Time:  Friday July 09 2018 12:54:04 EDT Ventricular Rate:  94 PR Interval:    QRS Duration: 72 QT Interval:  315 QTC Calculation: 394 R Axis:   89 Text Interpretation:  Sinus rhythm Short PR interval Nonspecific T wave abnormality Confirmed by Cathren Laine ([id number]) on 07/09/2018 12:57:43 PM   Radiology Dg Chest Port 1 View  Result Date: 07/09/2018 CLINICAL DATA:  Fever EXAM: PORTABLE CHEST 1 VIEW COMPARISON:  05/18/2018 FINDINGS: Right perihilar density similar to prior studies including CT chest 01/24/2011 most compatible with scarring. Negative for pneumonia. No effusion. Cervical and thoracic fusion hardware unchanged. IMPRESSION: Right perihilar scarring.  Negative for pneumonia. Electronically Signed   By: Marlan Palau M.D.   On: 07/09/2018 12:51    Procedures Procedures (including critical care time)  Medications Ordered in ED Medications  cefTRIAXone (ROCEPHIN) 2 g in sodium chloride 0.9 % 100 mL IVPB (has no administration in time range)  sodium chloride 0.9 % bolus 1,000 mL (has no administration in time range)  HYDROmorphone (DILAUDID) injection 0.5 mg (has no administration in time range)     Initial Impression / Assessment and Plan / ED Course  I have reviewed the triage vital signs and the nursing notes.  Pertinent labs & imaging results that were available during my care of the patient were reviewed by me and considered in my medical decision making (see chart for details).  Iv ns bolus. Labs. Cultures. Continuous pulse ox and monitor.   BP is low, 81/43. Ns bolus. ?sepsis.   Rocephin iv.   Pt requests med  for nausea. zofran iv.   Additional ivf bolus.   Medical service consulted for admission.   Recheck abd soft nt. With iv ns bolus, iv abx, bp mildly improved, 92/53.   CRITICAL CARE RE: fever 104.9, acute uti/sepsis/low blood pressure, dehydration Performed by: Suzi Roots Total critical care time: 40 minutes Critical care time was exclusive of separately billable procedures and treating other patients. Critical care was necessary to treat or prevent imminent or life-threatening deterioration. Critical care was time spent personally by me on the following activities: development of treatment plan with patient and/or surrogate as well as nursing, discussions with consultants, evaluation of patient's response to treatment, examination of patient, obtaining history from patient or surrogate, ordering and performing treatments and interventions, ordering and review of laboratory studies, ordering and review of radiographic studies, pulse oximetry and re-evaluation of patient's condition.     Final Clinical Impressions(s) / ED Diagnoses   Final diagnoses:  None    ED Discharge Orders    None         Cathren Laine, MD 07/09/18 1346

## 2018-07-09 NOTE — Progress Notes (Signed)
Patient admitted to 6E25 from ED via stretcher  Bed in low position, wheels locked.  Telemetry monitor applied.  Patient oriented to environment, including call bell, TV, meal times, and hourly rounding.  Patient c/o chronic back pain, requesting IV Dilaudid.  Informed her that Dilaudid po is ordered.  Discussed patient's request with MD.

## 2018-07-09 NOTE — Progress Notes (Signed)
Pharmacy Antibiotic Note  Stacy CraftBritany D Carlson is a 32 y.o. female with history of spinal cord injury admitted on 07/09/2018 with fever and weakness with cloudy urine in setting of suprapubic catheter concerning for UTI.  Pharmacy has been consulted for Merrem dosing. Patient was given Ceftriaxone in the ED. Patient has history of Pseudomonas and Serratia UTIs in the past as well as an ESBL Ecoli of a hip wound. Primary team is changing to cover for possible ESBL infection for now.   WBC is within normal limits. SCr is doubled from baseline at 0.82 (baseline 0.40). Last weight 45.4 kg.  Tmax was 104.5 on admission and she was hypotensive. Patient has noted allergy to Zosyn/Vanc - however this is only itching/rash and patient does not recall this allergy. Patient has tolerated Cephalosporins in past.   Plan: Discontinue Ceftriaxone.  Start Merrem 1g IV every 8 hours for now. Monitor UOP and SCr closely due to SCr being elevated from baseline and likely low muscle mass as may need dose reduction.  Follow-up culture results and ability to de-escalate as able.     Temp (24hrs), Avg:101.3 F (38.5 C), Min:99 F (37.2 C), Max:104.5 F (40.3 C)  Recent Labs  Lab 07/09/18 1236 07/09/18 1254 07/09/18 1527  WBC 9.6  --   --   CREATININE 0.82  --   --   LATICACIDVEN  --  1.90 0.49*    CrCl cannot be calculated (Unknown ideal weight.).    Allergies  Allergen Reactions  . Metoclopramide Hcl Other (See Comments)    Seizures   . Benadryl [Diphenhydramine Hcl] Rash  . Gabapentin Swelling  . Morphine Rash  . Vancomycin Itching and Rash    Pt states she is not aware of this allergy.   Laqueta Jean. Zosyn [Piperacillin-Tazobactam In Dex] Itching and Rash    Pt states that she is not aware of this allergy.     Antimicrobials this admission: Ceftriaxone 7/19 x1 Merrem 7/19 >>  Dose adjustments this admission:   Microbiology results: pending  Thank you for allowing pharmacy to be a part of this  patient's care.  Link SnufferJessica Jaylyne Breese, PharmD, BCPS, BCCCP Clinical Pharmacist Clinical phone 07/09/2018 until [phone]- [phone] Please [facility] for pharmacist numbers 07/09/2018 4:12 PM

## 2018-07-09 NOTE — Progress Notes (Signed)
RN paged Family Medicine Teaching Service pertaining to patient's blood pressure 76/42 and patient complaining of lightheadedness while laying in bed.

## 2018-07-09 NOTE — Progress Notes (Signed)
Paged by nurse about BP 76/42, patient complaining of lightheadedness.  Had received 1000cc NS bolus at 1300.  UOP 500cc.  Chart review reveals SBP 70s-90s during clinic visits without complaints.    -Bolus 1000cc NS -continue to monitor BP - SBP 70s-90s permissible if asymptomatic  Stephannie LiBailey J Rittberger, DO  PGY-1, Family Medicine 9:02 PM

## 2018-07-10 ENCOUNTER — Encounter (HOSPITAL_COMMUNITY): Payer: Self-pay | Admitting: Family Medicine

## 2018-07-10 DIAGNOSIS — N390 Urinary tract infection, site not specified: Secondary | ICD-10-CM

## 2018-07-10 DIAGNOSIS — L408 Other psoriasis: Secondary | ICD-10-CM

## 2018-07-10 DIAGNOSIS — Z95828 Presence of other vascular implants and grafts: Secondary | ICD-10-CM | POA: Insufficient documentation

## 2018-07-10 DIAGNOSIS — T83511D Infection and inflammatory reaction due to indwelling urethral catheter, subsequent encounter: Secondary | ICD-10-CM

## 2018-07-10 DIAGNOSIS — Z85820 Personal history of malignant melanoma of skin: Secondary | ICD-10-CM

## 2018-07-10 DIAGNOSIS — H60313 Diffuse otitis externa, bilateral: Secondary | ICD-10-CM | POA: Insufficient documentation

## 2018-07-10 DIAGNOSIS — Q6601 Congenital talipes equinovarus, right foot: Secondary | ICD-10-CM

## 2018-07-10 DIAGNOSIS — F339 Major depressive disorder, recurrent, unspecified: Secondary | ICD-10-CM

## 2018-07-10 DIAGNOSIS — M2450 Contracture, unspecified joint: Secondary | ICD-10-CM | POA: Diagnosis present

## 2018-07-10 DIAGNOSIS — M6250 Muscle wasting and atrophy, not elsewhere classified, unspecified site: Secondary | ICD-10-CM

## 2018-07-10 DIAGNOSIS — M869 Osteomyelitis, unspecified: Secondary | ICD-10-CM | POA: Diagnosis present

## 2018-07-10 DIAGNOSIS — Q6602 Congenital talipes equinovarus, left foot: Secondary | ICD-10-CM

## 2018-07-10 DIAGNOSIS — Q631 Lobulated, fused and horseshoe kidney: Secondary | ICD-10-CM | POA: Insufficient documentation

## 2018-07-10 DIAGNOSIS — M47812 Spondylosis without myelopathy or radiculopathy, cervical region: Secondary | ICD-10-CM

## 2018-07-10 DIAGNOSIS — T83511A Infection and inflammatory reaction due to indwelling urethral catheter, initial encounter: Secondary | ICD-10-CM | POA: Diagnosis present

## 2018-07-10 DIAGNOSIS — L8990 Pressure ulcer of unspecified site, unspecified stage: Secondary | ICD-10-CM

## 2018-07-10 HISTORY — DX: Diffuse otitis externa, bilateral: H60.313

## 2018-07-10 HISTORY — DX: Spondylosis without myelopathy or radiculopathy, cervical region: M47.812

## 2018-07-10 HISTORY — DX: Personal history of malignant melanoma of skin: Z85.820

## 2018-07-10 HISTORY — DX: Major depressive disorder, recurrent, unspecified: F33.9

## 2018-07-10 HISTORY — DX: Muscle wasting and atrophy, not elsewhere classified, unspecified site: M62.50

## 2018-07-10 HISTORY — DX: Urinary tract infection, site not specified: N39.0

## 2018-07-10 HISTORY — DX: Other psoriasis: L40.8

## 2018-07-10 HISTORY — DX: Urinary tract infection, site not specified: T83.511A

## 2018-07-10 HISTORY — DX: Congenital talipes equinovarus, right foot: Q66.01

## 2018-07-10 HISTORY — DX: Contracture, unspecified joint: M24.50

## 2018-07-10 HISTORY — DX: Osteomyelitis, unspecified: M86.9

## 2018-07-10 LAB — CBC
HCT: 23.2 % — ABNORMAL LOW (ref 36.0–46.0)
HCT: 23.8 % — ABNORMAL LOW (ref 36.0–46.0)
HCT: 37.2 % (ref 36.0–46.0)
Hemoglobin: 6.8 g/dL — CL (ref 12.0–15.0)
Hemoglobin: 6.9 g/dL — CL (ref 12.0–15.0)
Hemoglobin: 11.8 g/dL — ABNORMAL LOW (ref 12.0–15.0)
MCH: 22.1 pg — AB (ref 26.0–34.0)
MCH: 22.1 pg — ABNORMAL LOW (ref 26.0–34.0)
MCH: 25.4 pg — ABNORMAL LOW (ref 26.0–34.0)
MCHC: 29.3 g/dL — AB (ref 30.0–36.0)
MCHC: 29 g/dL — ABNORMAL LOW (ref 30.0–36.0)
MCHC: 31.7 g/dL (ref 30.0–36.0)
MCV: 75.6 fL — ABNORMAL LOW (ref 78.0–100.0)
MCV: 76.3 fL — ABNORMAL LOW (ref 78.0–100.0)
MCV: 80 fL (ref 78.0–100.0)
PLATELETS: 116 10*3/uL — AB (ref 150–400)
PLATELETS: 146 10*3/uL — AB (ref 150–400)
Platelets: 137 10*3/uL — ABNORMAL LOW (ref 150–400)
RBC: 3.07 MIL/uL — ABNORMAL LOW (ref 3.87–5.11)
RBC: 3.12 MIL/uL — ABNORMAL LOW (ref 3.87–5.11)
RBC: 4.65 MIL/uL (ref 3.87–5.11)
RDW: 17.6 % — ABNORMAL HIGH (ref 11.5–15.5)
RDW: 17.9 % — AB (ref 11.5–15.5)
RDW: 18.7 % — AB (ref 11.5–15.5)
WBC: 5 10*3/uL (ref 4.0–10.5)
WBC: 4.3 10*3/uL (ref 4.0–10.5)
WBC: 8.5 10*3/uL (ref 4.0–10.5)

## 2018-07-10 LAB — COMPREHENSIVE METABOLIC PANEL
ALBUMIN: 2.2 g/dL — AB (ref 3.5–5.0)
ALK PHOS: 50 U/L (ref 38–126)
ALT: 12 U/L (ref 0–44)
AST: 26 U/L (ref 15–41)
Anion gap: 4 — ABNORMAL LOW (ref 5–15)
BUN: 10 mg/dL (ref 6–20)
CALCIUM: 7.3 mg/dL — AB (ref 8.9–10.3)
CO2: 23 mmol/L (ref 22–32)
Chloride: 109 mmol/L (ref 98–111)
Creatinine, Ser: 0.7 mg/dL (ref 0.44–1.00)
GFR calc Af Amer: >60 mL/min (ref >60)
GFR calc non Af Amer: >60 mL/min (ref >60)
GLUCOSE: 107 mg/dL — AB (ref 70–99)
Potassium: 3 mmol/L — ABNORMAL LOW (ref 3.5–5.1)
Sodium: 136 mmol/L (ref 135–145)
Total Bilirubin: 0.5 mg/dL (ref 0.3–1.2)
Total Protein: 6.1 g/dL — ABNORMAL LOW (ref 6.5–8.1)

## 2018-07-10 LAB — PREPARE RBC (CROSSMATCH)

## 2018-07-10 MED ORDER — ONDANSETRON HCL 4 MG PO TABS
4.0000 mg | ORAL_TABLET | Freq: Three times a day (TID) | ORAL | Status: DC | PRN
  Administered 2018-07-14: 4 mg via ORAL
  Filled 2018-07-10: qty 1

## 2018-07-10 MED ORDER — SODIUM CHLORIDE 0.9% IV SOLUTION
Freq: Once | INTRAVENOUS | Status: AC
  Administered 2018-07-10: 11:00:00 via INTRAVENOUS

## 2018-07-10 MED ORDER — IBUPROFEN 200 MG PO TABS
400.0000 mg | ORAL_TABLET | Freq: Four times a day (QID) | ORAL | Status: DC | PRN
  Administered 2018-07-11 – 2018-07-13 (×4): 400 mg via ORAL
  Filled 2018-07-10 (×4): qty 2

## 2018-07-10 MED ORDER — SODIUM CHLORIDE 0.9 % IV SOLN
INTRAVENOUS | Status: AC
  Administered 2018-07-10: 04:00:00 via INTRAVENOUS

## 2018-07-10 NOTE — Progress Notes (Signed)
Two units of PRBC's transfused without incident.  Post-CBC ordered.

## 2018-07-10 NOTE — Progress Notes (Signed)
CRITICAL VALUE ALERT  Critical Value:  Repeat Hemoglobin 6.9  Date & Time Notied: 07/10/2018  [id number]  Provider Notified: Family Medicine Intern text paged  Orders Received/Actions taken: Awaiting orders.  Type and Cross drawn with repeat CBC.

## 2018-07-10 NOTE — Progress Notes (Signed)
Family Medicine Teaching Service Daily Progress Note Intern Pager: [phone]  Patient name: Stacy [name] Medical record number: [id number] Date of birth: 03-24-1986 Age: 32 y.o. Gender: female  Primary Care Provider: Norval Carlson, Stacy [name] Consultants: None Code Status: Full  Pt Overview and Major Events to Date:  7/19 - Admit  Assessment and Plan: Stacy [name] is a 32 y.o. female presenting with fever, back pain, malaise, and dysuria. PMH is significant for spinal injury, recurrent UTIs, kidney stones, and chronic pain.   Sepsis: 2/2 UTI: 104.9 fever on admission, right hip wound with new-onset red, hot rash and UTI with suprapubic cath as possible infection source, low BP nml for pt in 80/40 - 90/50 range - Admit to med-surg, attending Dr. Perley Jain - Merrem per pharm (7/19- ) - IV NS 50cc/hour - Tylenol PRN Fever >102 - Urine Cx pending - Blood cx pending - Monitor UOP and SCr closely - Add Vancomycin for MRSA coverage if patient's condition worsens (patient is unaware of Vanc/Zosyn allergy, and reported allergy is minimal)  Anemia: patient has chronically low HH, but became symptomatic 7/20 - Type and Cross - Transfuse 2 U PRBCs (7/20)  Anterior Right Thigh Rash: Right thigh rash borders have been outlined with a marker. 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  - Monitor for signs of spread and change - Merrem  AKI: patient's Cr 0.8 on admission. 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 Sensation in tact. - Lyrica 75 PO TID (home dose) - Dilaudid 2mg  PO q6 hrs (half home dose as she was drowsy on exam) - Tylenol 650mg  PO q6hours  Anxiety: treated with  Diazepam, Valium at home - Patient has long standing history of Benzo use - Due to patient's small stature, doses here in the hospital have been reduced by ~1/2 due to drowsiness - Watch for signs of Benzo withdrawal  Overactive Bladder: chronic problem - Darifenacin 15mg  PO qDaily  Asthma: mild - Albuterol nebulizer  GERD: patient reports history of an ulcer - Pantoprazole PRN - Zofran  Eczema, chronic, stable: Clobetasol creme  FEN/GI: regular diet, zofran Prophylaxis: SCDs  Disposition: admit to med-surg, wall  Subjective:  Patient is seen this morning resting in bed. She reports feeling better but is in more pain. I explained that her pain and anxiety medications were reduced due to presenting with some altered mental status the day prior. She denies headaches, chest pain, shortness of breath, abdominal pain, decreased appetite, nausea, vomiting, and diarrhea. She reports feeling light headed and constipated.  Objective: Temp:  [98.7 F (37.1 [name])-104.5 F (40.3 [name])] 99.6 F (37.6 [name]) (07/20 0409) Pulse Rate:  [80-113] 83 (07/20 0410) Resp:  [12-22] 14 (07/20 0410) BP: (69-105)/(40-68) 78/41 (07/20 0410) SpO2:  [98 %-100 %] 99 % (07/20 0410) Weight:  [87 lb 1.3 oz (39.5 kg)] 87 lb 1.3 oz (39.5 kg) (07/20 1478) Physical Exam: Constitutional: She is oriented to person, place, and time.  Head: Atraumatic. Diffuse dry scalp  Eyes: EOM are normal. No scleral icterus.  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  No murmur heard. Pulmonary/Chest: Effort normal.  She has no wheezes. She exhibits no tenderness.  Abdominal: Soft. Bowel sounds are normal. She exhibits no distension. There is tenderness to palpation. Suprapubic catheter is in place and appears clean without signs of drainage or infection. Musculoskeletal: She exhibits edema and deformity. Lower leg deformity grossly and limited  ROM 2/2 spinal injury. 3+ pitting Edema in feet bilaterally, stable. Neurological: She is alert and oriented to person, place, and time. No sensory deficit.  Skin: Skin is dry. Capillary refill takes 2 to 3 seconds. There is erythema (R. buttock, lateral thigh and anterior thigh, without crepitus) that has spread 0.5cm beyond previously-marked border.  Psychiatric: Responding faster today on exam than previously. Pleasant, A&O to person, place and time   Laboratory: Recent Labs  Lab 07/09/18 1236  WBC 9.6  HGB 9.1*  HCT 31.0*  PLT 202   Recent Labs  Lab 07/09/18 1236  NA 135  K 3.6  CL 101  CO2 24  BUN 9  CREATININE 0.82  CALCIUM 7.8*  PROT 7.6  BILITOT 0.9  ALKPHOS 56  ALT 12  AST 26  GLUCOSE 127*   MRSA (7/19): negative APTT (7/19): 34 nml Procalcitonin (7/19): 4.89 Lactic Acid: 1.9 > 0.49 (low) UA: hazy amber with 100 protein, mod leuks, few bacteria, few epithelial cells  Imaging/Diagnostic Tests: Dg Chest Port 1 View  Result Date: 07/09/2018 CLINICAL DATA:  Fever EXAM: PORTABLE CHEST 1 VIEW COMPARISON:  05/18/2018 FINDINGS: Right perihilar density similar to prior studies including CT chest 01/24/2011 most compatible with scarring. Negative for pneumonia. No effusion. Cervical and thoracic fusion hardware unchanged. IMPRESSION: Right perihilar scarring.  Negative for pneumonia. Electronically Signed   By: Marlan Palauharles  Clark M.D.   On: 07/09/2018 12:51   Stacy [name], Stacy [name], Stacy [name] 07/10/2018, 6:35 AM PGY-1, [facility] FPTS Intern pager: [phone], text pages welcome

## 2018-07-10 NOTE — Discharge Summary (Addendum)
[facility] Discharge Summary  Patient name: [name] Medical record number: [id number] Date of birth: 1986/10/30 Age: 32 y.o. 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 Anterior right thigh cellulitis Anemia AKI Protein calorie malnutrition Chronic pain Anxiety Overactive bladder Asthma GERD Eczema  Disposition: Discharge home  Discharge Condition: Stable  Discharge Exam:   General: 32 yo female, sitting with head of bed elevated, in NAD Head: Dry scalp Cardio: RRR no m/r/g Lungs: CTAB, no wheezing, crackles Abdominal: Soft, non-tender, + bowel sounds MSK: BLE grossly deformed, limited ROM 2/2 spinal injury, 2+ pitting edema of feet, stable Neuro: A&Ox3, sensation intact Skin: Erythema of right thigh improving, within distal border, no increased warmth to palpation Psych: Mood and affect appropriate for circumstance     Brief Hospital Course:  [name] is a 32 year old female was admitted for sepsis secondary to a UTI and/or leg wound with cellulitis. She was started on Merrem. She was found to be more anemic than usual and was transfused 2 Units PRBCs.  Her hemoglobin remained stable throughout the remainder of her hospitalization.  Infectious disease was consulted.  They recommended an MRI that showed a new left greater trochanter osteomyelitis as well as an improving right greater trochanter osteomyelitis.  She was also found to have cellulitis on her right anterior thigh.  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  Her urine culture was negative.  Blood cultures continue to show no growth at the time of discharge.  The decision was made to get the patient a PICC line and continue vancomycin and Rocephin for 6 weeks at home.  She will also continue wound care as an outpatient.  Throughout her hospitalization the patient remained hemodynamically stable and improved clinically.  It is also of note that patient was very drowsy upon admission to the hospital.  She was on large doses of Valium and Dilaudid for anxiety and chronic pain.  Due to drowsiness and small stature her home doses are decreased.  The patient did well during her hospitalization receiving half doses of home Valium and Dilaudid.  The risks of taking both benzodiazepines and opioids at the same time were discussed at length with the patient.  The decision was made to continue the Valium and Dilaudid outpatient at the current decreased doses.  The patient was advised to follow-up with her prescribing provider.  Her albumin was also low on admission and likely secondary to protein calorie malnutrition.  Nutrition was consulted and recommended nutrition supplementation as well as continuing to encourage intake of food and water.  Issues for Follow Up:  1. Will need wound care for her 3 stage IV ulcers. 2. Will need follow-up with care for PICC line and IV antibiotics. 3. Will need follow-up of Valium and Dilaudid dosing.  Would recommend not increasing the Valium and Dilaudid to the dosage that she was on prior to admission. 4. Repeat BMP while on vancomycin. 5. Will need repeat CBC for follow-up of anemia. 6. Will need follow-up as nutrition status. 7. Will need continued care of her suprapubic catheter.  Significant Procedures:  7/20 - transfuse 2 Units PRBCs 7/24- PICC  line insertion  Significant Labs and Imaging:  Recent Labs  Lab 07/11/18 0413 07/12/18 0650 07/14/18 0457  WBC 8.0 6.0 7.3  HGB 12.4 11.5* 13.8  HCT 38.8 36.3  44.0  PLT 162 155 235   Recent Labs  Lab 07/09/18 1236 07/10/18 0612 07/11/18 0413 07/12/18 0650 07/13/18 0925 07/14/18 0353  NA 135 136 136 140 141 140  K 3.6 3.0* 4.0 4.5 5.8* 4.6  CL 101 109 104 105 106 97*  CO2 24 23 25 27 25 30   GLUCOSE 127* 107* 94 110* 91 98  BUN 9 10 5* 7 10 10   CREATININE 0.82 0.70 0.48 0.52 0.39* 0.42*  CALCIUM 7.8* 7.3* 7.9* 8.0* 8.1* 8.8*  ALKPHOS 56 50  --   --   --   --   AST 26 26  --   --   --   --   ALT 12 12  --   --   --   --   ALBUMIN 3.0* 2.2*  --   --   --   --    MRSA (7/19): negative APTT (7/19): 34 nml Procalcitonin (7/19): 4.89 Lactic Acid: 1.9 > 0.49 (low) UA: hazy amber with 100 protein, mod leuks, few bacteria, few epithelial cells  Results/Tests Pending at Time of Discharge: none  Discharge Medications:  Allergies as of 07/15/2018      Reactions   Metoclopramide Hcl Other (See Comments)   Seizures   Benadryl [diphenhydramine Hcl] Rash   Gabapentin Swelling   Morphine Rash   Vancomycin Itching, Rash   Pt states she is not aware of this allergy.    Zosyn [piperacillin-tazobactam In Dex] Itching, Rash   Pt states that she is not aware of this allergy.       Medication List    STOP taking these medications   cephALEXin 500 MG capsule Commonly known as:  KEFLEX     TAKE these medications   albuterol 108 (90 Base) MCG/ACT inhaler Commonly known as:  PROVENTIL HFA;VENTOLIN HFA Inhale 2 puffs into the lungs every 4 (four) hours as needed for wheezing or shortness of breath.   cefTRIAXone IVPB Commonly known as:  ROCEPHIN Inject 2 g into the vein daily. Indication:  Osteomyelitis  Last Day of Therapy:  08/19/18 Labs - Once weekly:  CBC/D and BMP, Labs - Every other week:  ESR and CRP   clobetasol 0.05 % external solution Commonly known as:  TEMOVATE Apply 1 application topically as directed.   diazepam 5 MG tablet Commonly known as:  VALIUM Take 0.5 tablets (2.5 mg total) by mouth 3 (three) times daily. What  changed:  how much to take   feeding supplement (ENSURE ENLIVE) Liqd Take 237 mLs by mouth 3 (three) times daily between meals.   gentamicin cream 0.1 % Commonly known as:  GARAMYCIN Apply 1 application topically as directed. Apply topically to left hip wound with dressing changes.   HYDROmorphone 2 MG tablet Commonly known as:  DILAUDID Take 1 tablet (2 mg total) by mouth every 6 (six) hours as needed for severe pain. What changed:    medication strength  how much to take  reasons to take this Notes to patient:  Last taken 07/14/18 1255   ibuprofen 200 MG tablet Commonly known as:  ADVIL,MOTRIN Take 400 mg by mouth every 6 (six) hours as needed for headache or moderate pain.   ketoconazole 2 % cream Commonly known as:  NIZORAL Apply 1 application topically daily as needed for  dry skin.   ketoconazole 2 % shampoo Commonly known as:  NIZORAL Apply 1 application topically as directed.   mometasone 0.1 % cream Commonly known as:  ELOCON Apply 1 application topically daily as needed for dry skin.   multivitamin with minerals Tabs tablet Take 1 tablet by mouth daily.   NARCAN 4 MG/0.1ML Liqd nasal spray kit Generic drug:  naloxone Place 1 spray into the nose as directed. PRF Accidental overdose   pregabalin 75 MG capsule Commonly known as:  LYRICA Take 1 capsule (75 mg total) by mouth 3 (three) times daily.   solifenacin 10 MG tablet Commonly known as:  VESICARE Take 10 mg by mouth every morning.   vancomycin IVPB Inject 750 mg into the vein every 12 (twelve) hours. Indication:  osteomyelitis  Last Day of Therapy:  08/24/18 Labs - Sunday/Monday:  CBC/D, BMP, and vancomycin trough. Labs - Thursday:  BMP and vancomycin trough Labs - Every other week:  ESR and CRP            Home Infusion Instuctions  (From admission, onward)        Start     Ordered   07/14/18 0000  Home infusion instructions [facility] May follow [facility] Dosing Protocol; May  administer Cathflo as needed to maintain patency of vascular access device.; Flushing of vascular access device: per [facility] Protocol: 0.9% NaCl pre/post medica...    Question Answer Comment  Instructions May follow [facility] Dosing Protocol   Instructions May administer Cathflo as needed to maintain patency of vascular access device.   Instructions Flushing of vascular access device: per [facility] Protocol: 0.9% NaCl pre/post medication administration and prn patency; Heparin 100 u/ml, 5ml for implanted ports and Heparin 10u/ml, 5ml for all other central venous catheters.   Instructions May follow [facility] Anaphylaxis Protocol for First Dose Administration in the home: 0.9% NaCl at 25-50 ml/hr to maintain IV access for protocol meds. Epinephrine 0.3 ml IV/IM PRN and Benadryl 25-50 IV/IM PRN s/s of anaphylaxis.   Instructions [facility] Infusion Coordinator (RN) to assist per patient IV care needs in the home PRN.      07 /24/19 1541      Discharge Instructions: Please refer to Patient Instructions section of EMR for full details.  Patient was counseled important signs and symptoms that should prompt return to medical care, changes in medications, dietary instructions, activity restrictions, and follow up appointments.   Follow-Up Appointments: Follow-up Information    Ricard Dillon, MD. Call in 1 day(s).   Specialty:  Internal Medicine Why:  Call Dr Dellia Nims to start up your wound care Contact information: [address] [place] [address] [phone]        Brigitte Pulse, DO. Schedule an appointment as soon as possible for a visit in 1 week(s).   Specialty:  Family Medicine Contact information: [place] [address] [phone]        [facility] [place] Follow up.   Specialty:  Home Health Services Why:  Registered Nurse and Physical Therapy Contact information: [address] [place]  [address] [phone]           Cleophas Dunker, DO 07/15/2018, 4:55 PM PGY-1, [place]

## 2018-07-10 NOTE — Progress Notes (Signed)
Paged by nurse.  Patient had temp of 100.8.  Received scheduled Tylenol 650mg  at 2306, due for next dose at 0600.  Advised to recheck temp at 0400.  Will consider treating with Tylenol if >102.  Continue to montior.  Nurse also reported MAP of 53.  Patient asymptomatic.  Has received total bolus x2 since admission.  Started on 1.115mIVF over 10 hours.  Will continue to monitor.  Stephannie LiBailey J Rittberger, DO  PGY-1, Family Medicine 3:34 AM

## 2018-07-10 NOTE — Progress Notes (Signed)
Patient's oral temperature is 100.8 degrees Farenheit.  Patient received scheduled Tylenol 650mg  at 2306, next dose due at 0600.  Family Medicine Teaching Service paged with this information.

## 2018-07-10 NOTE — Progress Notes (Signed)
During bedside reporting RN asked patient if she would be agreeable to changing out current bed with a air mattress bed.  Patient stated no because she has tried the air beds before and they hurt her back.  RN educated patient on benefits of air bed.  Patient continued to refuse air mattress bed.

## 2018-07-11 DIAGNOSIS — G909 Disorder of the autonomic nervous system, unspecified: Secondary | ICD-10-CM

## 2018-07-11 DIAGNOSIS — B9620 Unspecified Escherichia coli [E. coli] as the cause of diseases classified elsewhere: Secondary | ICD-10-CM

## 2018-07-11 DIAGNOSIS — G8254 Quadriplegia, C5-C7 incomplete: Secondary | ICD-10-CM

## 2018-07-11 DIAGNOSIS — L89899 Pressure ulcer of other site, unspecified stage: Secondary | ICD-10-CM

## 2018-07-11 DIAGNOSIS — Z1612 Extended spectrum beta lactamase (ESBL) resistance: Secondary | ICD-10-CM

## 2018-07-11 DIAGNOSIS — Z87440 Personal history of urinary (tract) infections: Secondary | ICD-10-CM

## 2018-07-11 DIAGNOSIS — T83511A Infection and inflammatory reaction due to indwelling urethral catheter, initial encounter: Secondary | ICD-10-CM

## 2018-07-11 DIAGNOSIS — N390 Urinary tract infection, site not specified: Secondary | ICD-10-CM

## 2018-07-11 DIAGNOSIS — L02416 Cutaneous abscess of left lower limb: Secondary | ICD-10-CM

## 2018-07-11 DIAGNOSIS — Z8619 Personal history of other infectious and parasitic diseases: Secondary | ICD-10-CM

## 2018-07-11 LAB — TYPE AND SCREEN
ABO/RH(D): A POS
ANTIBODY SCREEN: NEGATIVE
UNIT DIVISION: 0
Unit division: 0

## 2018-07-11 LAB — BASIC METABOLIC PANEL
Anion gap: 7 (ref 5–15)
BUN: 5 mg/dL — ABNORMAL LOW (ref 6–20)
CHLORIDE: 104 mmol/L (ref 98–111)
CO2: 25 mmol/L (ref 22–32)
Calcium: 7.9 mg/dL — ABNORMAL LOW (ref 8.9–10.3)
Creatinine, Ser: 0.48 mg/dL (ref 0.44–1.00)
GFR calc non Af Amer: >60 mL/min (ref >60)
Glucose, Bld: 94 mg/dL (ref 70–99)
Potassium: 4 mmol/L (ref 3.5–5.1)
Sodium: 136 mmol/L (ref 135–145)

## 2018-07-11 LAB — CBC
HCT: 38.8 % (ref 36.0–46.0)
HEMOGLOBIN: 12.4 g/dL (ref 12.0–15.0)
MCH: 25.2 pg — AB (ref 26.0–34.0)
MCHC: 32 g/dL (ref 30.0–36.0)
MCV: 78.9 fL (ref 78.0–100.0)
Platelets: 162 10*3/uL (ref 150–400)
RBC: 4.92 MIL/uL (ref 3.87–5.11)
RDW: 18.9 % — AB (ref 11.5–15.5)
WBC: 8 10*3/uL (ref 4.0–10.5)

## 2018-07-11 LAB — URINE CULTURE: CULTURE: NO GROWTH

## 2018-07-11 LAB — BPAM RBC
Blood Product Expiration Date: 201908092359
Blood Product Expiration Date: 201908092359
ISSUE DATE / TIME: 201907201114
ISSUE DATE / TIME: 201907201450
UNIT TYPE AND RH: 6200
Unit Type and Rh: 6200

## 2018-07-11 MED ORDER — GLYCERIN (LAXATIVE) 2.1 G RE SUPP
1.0000 | Freq: Every day | RECTAL | Status: DC | PRN
  Filled 2018-07-11: qty 1

## 2018-07-11 NOTE — Progress Notes (Addendum)
Family Medicine Teaching Service Daily Progress Note Intern Pager: [phone]  Patient name: [name] Medical record number: [id number] Date of birth: 02/09/1986 Age: 32 y.o. Gender: female  Primary Care Provider: Norval Gable, DO Consultants: None Code Status: Full  Pt Overview and Major Events to Date:  7/19 - Admit 7/20 transfused 2u RBC  Assessment and Plan: [name] is a 32 y.o. female presenting with fever, back pain, malaise, and dysuria. PMH is significant for spinal injury, recurrent UTIs, kidney stones, and chronic pain.   Sepsis: 2/2 UTI: improving, afebrile overnight 7/20 with hypotension consistent with chronic levels.  Right hip wound with rash spreading 1" beyond borders marked on admission but receeding proximally.  It is not disproportionately warm, low BP nml for pt in 80/40 - 90/50 range - Merrem per pharm (7/19- ) -consulted ID for abx plan - Tylenol PRN Fever >102 - Urine Cx pending - Blood cx pending (ng x1day) - Monitor UOP and SCr closely (creatinine back to baseline 0.48, uop 1.9 liters overnight 7/20) - Add Vancomycin for MRSA coverage if patient's condition worsens (patient is unaware of Vanc/Zosyn allergy, and reported allergy is minimal)  Anemia: s/p 2u RBC from low 6.8, now 12.4 -daily CBC  Anterior Right Thigh Rash:  Right thigh rash borders have been outlined with a marker. 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  - Monitor for signs of spread and change - Merrem  AKI: resolved.  Cr now 0.48 down from patient's Cr 0.8 on admission. 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 Sensation in tact. - Lyrica 75 PO TID (home  dose) - Dilaudid 2mg  PO q6 hrs (half home dose as she was drowsy on exam) - Tylenol 650mg  PO q6hours  Anxiety: treated with Valium 5mg  TID at home - Due to patient's small stature and drowsiness, doses here in the hospital have been reduced by ~1/2 - Watch for signs of Benzo withdrawal  Overactive Bladder: chronic problem - Darifenacin 15mg  PO qDaily  Asthma: mild - Albuterol nebulizer prn  GERD: patient reports history of an ulcer - Pantoprazole PRN - Zofran  Eczema, chronic, stable: Clobetasol creme  FEN/GI: regular diet, zofran Prophylaxis: SCDs  Disposition: admit to med-surg, wall  Subjective:  Feeling subjectively to baseline.   Is willing to stay to complete treatment but would like to go home  Objective: Temp:  [99.1 F (37.3 C)-99.9 F (37.7 C)] 99.6 F (37.6 C) (07/20 2024) Pulse Rate:  [87-97] 88 (07/20 2024) Resp:  [16-22] 16 (07/20 2024) BP: (80-95)/(45-65) 83/53 (07/20 2024) SpO2:  [98 %-100 %] 98 % (07/20 1715) Physical Exam: Constitutional: She is oriented to person, place, and time.   Head: Atraumatic. Diffuse dry scalp  Eyes: EOM are normal. No scleral icterus.  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  No murmur heard. Pulmonary/Chest: Effort normal. She has no wheezes. She exhibits no tenderness.  Abdominal: Soft. Bowel sounds are normal. She exhibits no distension. There is tenderness to palpation. Suprapubic catheter is in place and appears clean without signs of drainage or infection. Musculoskeletal: She exhibits edema and deformity. Lower  leg deformity grossly and limited ROM 2/2 spinal injury. 3+ pitting Edema in feet bilaterally, stable. Neurological: She is alert and oriented to person, place, and time. No sensory deficit.  Skin: Skin is dry. Capillary refill takes 2 to 3 seconds. There is erythema (R. buttock, lateral thigh and anterior thigh,  without crepitus) that has spread 0.5cm beyond previously-marked border.  Psychiatric: Responding faster today on exam than previously. Pleasant, A&O to person, place and time   Laboratory: Recent Labs  Lab 07/10/18 0751 07/10/18 1904 07/11/18 0413  WBC 4.3 8.5 8.0  HGB 6.9* 11.8* 12.4  HCT 23.8* 37.2 38.8  PLT 116* 146* 162   Recent Labs  Lab 07/09/18 1236 07/10/18 0612 07/11/18 0413  NA 135 136 136  K 3.6 3.0* 4.0  CL 101 109 104  CO2 24 23 25   BUN 9 10 5*  CREATININE 0.82 0.70 0.48  CALCIUM 7.8* 7.3* 7.9*  PROT 7.6 6.1*  --   BILITOT 0.9 0.5  --   ALKPHOS 56 50  --   ALT 12 12  --   AST 26 26  --   GLUCOSE 127* 107* 94   MRSA (7/19): negative APTT (7/19): 34 nml Procalcitonin (7/19): 4.89 Lactic Acid: 1.9 > 0.49 (low) UA: hazy amber with 100 protein, mod leuks, few bacteria, few epithelial cells  Imaging/Diagnostic Tests: Dg Chest Port 1 View  Result Date: 07/09/2018 CLINICAL DATA:  Fever EXAM: PORTABLE CHEST 1 VIEW COMPARISON:  05/18/2018 FINDINGS: Right perihilar density similar to prior studies including CT chest 01/24/2011 most compatible with scarring. Negative for pneumonia. No effusion. Cervical and thoracic fusion hardware unchanged. IMPRESSION: Right perihilar scarring.  Negative for pneumonia. Electronically Signed   By: Marlan Palauharles  Clark M.D.   On: 07/09/2018 12:51   Marthenia RollingBland, Samba Cumba, DO 07/11/2018, 7:00 AM PGY-2, [facility] FPTS Intern pager: [phone], text pages welcome
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
## 2018-07-12 DIAGNOSIS — M2450 Contracture, unspecified joint: Secondary | ICD-10-CM

## 2018-07-12 DIAGNOSIS — Z881 Allergy status to other antibiotic agents status: Secondary | ICD-10-CM

## 2018-07-12 DIAGNOSIS — G8250 Quadriplegia, unspecified: Secondary | ICD-10-CM

## 2018-07-12 DIAGNOSIS — L89329 Pressure ulcer of left buttock, unspecified stage: Secondary | ICD-10-CM

## 2018-07-12 DIAGNOSIS — G8929 Other chronic pain: Secondary | ICD-10-CM

## 2018-07-12 DIAGNOSIS — A419 Sepsis, unspecified organism: Secondary | ICD-10-CM

## 2018-07-12 DIAGNOSIS — L8993 Pressure ulcer of unspecified site, stage 3: Secondary | ICD-10-CM

## 2018-07-12 DIAGNOSIS — Z885 Allergy status to narcotic agent status: Secondary | ICD-10-CM

## 2018-07-12 DIAGNOSIS — L89229 Pressure ulcer of left hip, unspecified stage: Secondary | ICD-10-CM

## 2018-07-12 DIAGNOSIS — M6250 Muscle wasting and atrophy, not elsewhere classified, unspecified site: Secondary | ICD-10-CM

## 2018-07-12 DIAGNOSIS — L89159 Pressure ulcer of sacral region, unspecified stage: Secondary | ICD-10-CM

## 2018-07-12 DIAGNOSIS — S14109S Unspecified injury at unspecified level of cervical spinal cord, sequela: Secondary | ICD-10-CM

## 2018-07-12 DIAGNOSIS — Z888 Allergy status to other drugs, medicaments and biological substances status: Secondary | ICD-10-CM

## 2018-07-12 LAB — CBC
HCT: 36.3 % (ref 36.0–46.0)
HEMOGLOBIN: 11.5 g/dL — AB (ref 12.0–15.0)
MCH: 25 pg — ABNORMAL LOW (ref 26.0–34.0)
MCHC: 31.7 g/dL (ref 30.0–36.0)
MCV: 78.9 fL (ref 78.0–100.0)
Platelets: 155 10*3/uL (ref 150–400)
RBC: 4.6 MIL/uL (ref 3.87–5.11)
RDW: 19.8 % — ABNORMAL HIGH (ref 11.5–15.5)
WBC: 6 10*3/uL (ref 4.0–10.5)

## 2018-07-12 LAB — BASIC METABOLIC PANEL
Anion gap: 8 (ref 5–15)
BUN: 7 mg/dL (ref 6–20)
CHLORIDE: 105 mmol/L (ref 98–111)
CO2: 27 mmol/L (ref 22–32)
CREATININE: 0.52 mg/dL (ref 0.44–1.00)
Calcium: 8 mg/dL — ABNORMAL LOW (ref 8.9–10.3)
GFR calc non Af Amer: >60 mL/min (ref >60)
Glucose, Bld: 110 mg/dL — ABNORMAL HIGH (ref 70–99)
POTASSIUM: 4.5 mmol/L (ref 3.5–5.1)
SODIUM: 140 mmol/L (ref 135–145)

## 2018-07-12 MED ORDER — JUVEN PO PACK
1.0000 | PACK | Freq: Two times a day (BID) | ORAL | Status: DC
  Administered 2018-07-12 – 2018-07-14 (×4): 1 via ORAL
  Filled 2018-07-12 (×6): qty 1

## 2018-07-12 MED ORDER — ONDANSETRON HCL 4 MG/2ML IJ SOLN
4.0000 mg | Freq: Once | INTRAMUSCULAR | Status: AC
  Administered 2018-07-12: 4 mg via INTRAVENOUS
  Filled 2018-07-12: qty 2

## 2018-07-12 MED ORDER — ONDANSETRON HCL 4 MG/2ML IJ SOLN
4.0000 mg | Freq: Four times a day (QID) | INTRAMUSCULAR | Status: DC | PRN
  Administered 2018-07-12 – 2018-07-14 (×6): 4 mg via INTRAVENOUS
  Filled 2018-07-12 (×7): qty 2

## 2018-07-12 NOTE — Progress Notes (Signed)
Pharmacy Antibiotic Note  Simeon CraftBritany D Dondero is a 32 y.o. female with history of spinal cord injury admitted on 07/09/2018 with fever and weakness with cloudy urine in setting of suprapubic catheter concerning for UTI and questionable osteo.  Pharmacy has been consulted for Merrem dosing. Patient has history of Pseudomonas and Serratia UTIs in the past as well as an ESBL Ecoli of a hip wound.    WBC remains wnl, afebrile, renal function stable. MRI pending. Remains culture negative   Plan: Continue Merrem 1g IV every 8 hours for now.  Follow-up culture results and ability to de-escalate as able F/u MRI results to r/o osteo.   Height: 4\' 8"  (142.2 cm) Weight: 87 lb 1.3 oz (39.5 kg) IBW/kg (Calculated) : 36.3  Temp (24hrs), Avg:98.6 F (37 C), Min:98.3 F (36.8 C), Max:98.8 F (37.1 C)  Recent Labs  Lab 07/09/18 1236 07/09/18 1254 07/09/18 1527 07/10/18 0612 07/10/18 0751 07/10/18 1904 07/11/18 0413 07/12/18 0650  WBC 9.6  --   --  5.0 4.3 8.5 8.0 6.0  CREATININE 0.82  --   --  0.70  --   --  0.48 0.52  LATICACIDVEN  --  1.90 0.49*  --   --   --   --   --     Estimated Creatinine Clearance: 58.4 mL/min (by C-G formula based on SCr of 0.52 mg/dL).    Allergies  Allergen Reactions  . Metoclopramide Hcl Other (See Comments)    Seizures   . Benadryl [Diphenhydramine Hcl] Rash  . Gabapentin Swelling  . Morphine Rash  . Vancomycin Itching and Rash    Pt states she is not aware of this allergy.   Laqueta Jean. Zosyn [Piperacillin-Tazobactam In Dex] Itching and Rash    Pt states that she is not aware of this allergy.     Ceftriaxone 7/19 x1 Merrem 7/19 >>  7/19 Blood >> ngtd 7/20 UCx (cath): neg  7/19 MRSA PCR neg   Thank you for allowing pharmacy to be a part of this patient's care.  Vinnie LevelBenjamin Rilya Longo, PharmD., BCPS Clinical Pharmacist Clinical phone for 07/12/18 until 3:30pm: [phone] If after 3:30pm, please refer to [facility] for unit-specific pharmacist

## 2018-07-12 NOTE — Progress Notes (Addendum)
Family Medicine Teaching Service Daily Progress Note Intern Pager: [phone]  Patient name: [name] Medical record number: [id number] Date of birth: Oct 08, 1986 Age: 32 y.o. Gender: female  Primary Care Provider: Norval Gable, DO Consultants: None Code Status: Full  Pt Overview and Major Events to Date:  7/19 - Admit 7/20 transfused 2u RBC  Assessment and Plan: [name] is a 32 y.o. female presenting with fever, back pain, malaise, and dysuria. PMH is significant for spinal injury, recurrent UTIs, kidney stones, and chronic pain.   Sepsis: 2/2 UTI: Improving Sacral ID recommends any cephalosporin (except ancef) x 5 days for UTI treatment, but continue Meropenem until osteomyelitis has been ruled out.  Afebrile and normotensive overnight 7/22. - Meropenem per pharm (7/19- ) - Tylenol PRN Fever >102 - Urine Cx No growth final result - Blood cx pending (ng x2day) - Monitor UOP and SCr closely (Cr this AM 0.52, UOP 1800cc on 7/21)  Multiple sacral decub: concern for osteomyelitis Progressed from stage 3 to stage 4 per wound care.  Concern osteomyelitis per ID.  If MRI positive, ID recommends bone biopsy. -MRI shows osteomyelitis of the right femur's greater trochanter - MRI also Decubitus ulcers with diffuse myofasciitis of hip and pelvic muscles, Right > Left.  - Will need to pursue placement of PICC line - Await ID service opinion on further treatment  Anterior Right Thigh Rash: concern for polymyositis Right thigh rash improving.  Erythema resolved on proximal thigh, distally within drawn border. - f/u MRI right femur for concern of pyomyositis - Monitor for signs of spread and change  Anemia: Stable Hgb pending this AM -cont daily CBC  AKI: Resolved Basline 0.4.  Cr pending this AM. 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 Sensation intact. - Lyrica 75 PO TID (home dose) - Dilaudid 2mg  PO q6 hrs (half home dose as she was drowsy on exam) - Tylenol 650mg  PO q6hours  Anxiety: Stable Treated with Valium 5mg  TID at home - continue Valium 2.5mg  TID  - cont to monitor for signs of Benzo withdrawal  Overactive Bladder: Chronic - Cont Darifenacin 15mg  PO qDaily  Asthma: Mild - Albuterol nebulizer prn  GERD: Chronic Patient reports history of an ulcer - Pantoprazole PRN - Zofran  Eczema:Chronic, stable  - cont Clobetasol creme  FEN/GI: regular diet, zofran Prophylaxis: SCDs  Disposition: home, pending clinical improvement  Subjective:  Patient states, "I feel fine and I want to go home."  Denies any complaints.  Objective: Temp:  [98.1 F (36.7 C)-98.9 F (37.2 C)] 98.3 F (36.8 C) (07/23 0838) Pulse Rate:  [77-94] 77 (07/23 0838) Resp:  [16-22] 16 (07/23 0838) BP: (99-127)/(65-94) 99/65 (07/23 0838) SpO2:  [98 %-99 %] 98 % (07/23 2956)  Physical Exam: General: 32 yo female in NAD, sitting with head of bed elevated Head: Dry scalp Cardio: RRR no m/r/g Lungs: CTAB, no wheezing, rhonchi, crackles Abdominal: Soft, non-tender, +bowel sounds MSK: BLE grossly deformed, limited ROM 2/2 spinal injury, 2+ pitting edema of feet, stable Neuro: A&Ox3, no sensory deficit Skin: Erythema of right anterior thigh appears improved on proximal edge, within borders distally, no increased warmth Psych: Mood and affect appropriate for circumstance    Laboratory: Recent Labs  Lab 07/10/18 1904 07/11/18 0413 07/12/18  0650  WBC 8.5 8.0 6.0  HGB 11.8* 12.4 11.5*  HCT 37.2 38.8 36.3  PLT 146* 162 155   Recent Labs  Lab 07/09/18 1236 07/10/18 0612 07/11/18 0413 07/12/18 0650  NA 135 136 136 140  K 3.6 3.0* 4.0 4.5  CL 101 109 104 105  CO2 24 23 25 27   BUN 9 10  5* 7  CREATININE 0.82 0.70 0.48 0.52  CALCIUM 7.8* 7.3* 7.9* 8.0*  PROT 7.6 6.1*  --   --   BILITOT 0.9 0.5  --   --   ALKPHOS 56 50  --   --   ALT 12 12  --   --   AST 26 26  --   --   GLUCOSE 127* 107* 94 110*   MRSA (7/19): negative APTT (7/19): 34 nml Procalcitonin (7/19): 4.89 Lactic Acid: 1.9 > 0.49 (low) UA: hazy amber with 100 protein, mod leuks, few bacteria, few epithelial cells  Imaging/Diagnostic Tests: Dg Chest Port 1 View  Result Date: 07/09/2018 CLINICAL DATA:  Fever EXAM: PORTABLE CHEST 1 VIEW COMPARISON:  05/18/2018 FINDINGS: Right perihilar density similar to prior studies including CT chest 01/24/2011 most compatible with scarring. Negative for pneumonia. No effusion. Cervical and thoracic fusion hardware unchanged. IMPRESSION: Right perihilar scarring.  Negative for pneumonia. Electronically Signed   By: Marlan Palauharles  Clark M.D.   On: 07/09/2018 12:51   Rittberger, Solmon IceBailey J, DO 07/13/2018, 9:48 AM PGY-1, [place] [facility] FPTS Intern pager: [phone], text pages welcome  I have interviewed and examined the patient.  I have discussed the case and verified the key findings with Dr. Sindy Messingittberger.   I agree with their assessments and plans as documented in their note for today.

## 2018-07-12 NOTE — Consult Note (Signed)
WOC Nurse wound consult note Reason for Consult: multiple pressure injuries Patient from home, refuses LALM due to multiple other medical problems.  Boyfriend provides care at home including wound care. She reports she is followed by the wound care center, Dr. Leanord Hawkingobson. Was seen in wound care center last Monday and she reports "Dr. Leanord Hawkingobson said they all look fine".  Ordered silver hydrofiber at that time, which they are performing every other day.  She also reported "antibioitc cream" ordered by Dr. Leanord Hawkingobson for the left trochanter. Patient has pressure injuries that she is not aware of on her bilateral heels. She reports they are "because my feet are swollen" Wound type: Sacral:  Stage 4 Pressure Injury- 0.3cm x 0.5cm x 0.2cm, healing but with epibole of wound edges. Unable to visualize wound bed Right ischial tuberosity wound Stage 4 pressure injury: 7cm x 1.5cm x 2cm.  Bone felt with cotton tipped applicator at base. 100% clean and pink, moist   Left IT Stage 4 pressure injury: 4cm x 2cm x 0.5cm;  Non grandulating clean wound bed. Palpable bone in base Left superior buttocks Stage 4 pressure injury:  3cm x 3.2 cm x 1.5cm partial thickness wound that is red, non-granulating. Minimal exudate. 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 She is followed by ID.  For MRI pelvis and right thigh; patient has new onset right thigh erythema and induration that has been marked and is not receeding at this time.  Discussed with patient wound care orders and that she has MRI pending. She states "well I'm signing out AMA"   Silver hydrofiber for all open pressure injuries.  Patient needs Low air loss mattress but refuses. Patient need Prevalon boots for her heels to offload, but she refuses this as well.    Maximize nutrition.    Discussed POC with patient and bedside nurse.  Re consult if needed, will not follow at this time. Thanks  [name] M[employer] MSN, RN,CWOCN, CNS, CWON-AP [phone])
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
## 2018-07-12 NOTE — Progress Notes (Addendum)
Family Medicine Teaching Service Daily Progress Note Intern Pager: [phone]  Patient name: [name] Medical record number: [id number] Date of birth: 05/29/1986 Age: 32 y.o. Gender: female  Primary Care Provider: Norval Gable, DO Consultants: None Code Status: Full  Pt Overview and Major Events to Date:  7/19 - Admit 7/20 transfused 2u RBC  Assessment and Plan: [name] is a 32 y.o. female presenting with fever, back pain, malaise, and dysuria. PMH is significant for spinal injury, recurrent UTIs, kidney stones, and chronic pain.   Sepsis: 2/2 UTI: Improving Right hip wound with rash stable from prior exam. BP this AM 100/42, stable from history of SBP.  Sacral decub stable, wound care seeing.  ID recommends any cephalosporin (except ancef) x 5 days for UTI treatment, but continue Meropenem until osteomyelitis has been ruled out. - Meropenem per pharm (7/19- ) - Tylenol PRN Fever >102 - Urine Cx No growth final result - Blood cx pending (ng x2day) - Monitor UOP and SCr closely (Cr this AM 0.52, UOP 1800cc on 7/21)  Anemia: Stable s/p 2u RBC from low 6.8, 11.5 on 7/22 -daily CBC  Anterior Right Thigh Rash: Stable   Right thigh rash borders have been outlined with a marker. 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  ID states this is not predictive of underlying infection and recommends MRI for evaluation of deeper infection.  If MRI shows osteo, ID recommends bone biopsy. - MRI of sacrum for concern of osteomyelitis - MRI right femur for concern of pyomyositis - Monitor for signs of spread and change  AKI: Resolved  Cr 0.8 on admission. Cr on 7/22 0.52.  This number typically runs lower at 0.4 likely due to low mass. - Patient currently on Meropenem - Monitor Cr with daily BMP  - Monitor UOP   Protein Calorie Malnutrition Albumin 2.2 on admission. - f/u nutrition consult  Chronic Pain: Stable  Quadriplegic 2/2 to a  spinal injury, history of scoliosis, multiple surgeries to correct scoliosis and talipes equinovarus. Sensation intact. - Lyrica 75 PO TID (home dose) - Dilaudid 2mg  PO q6 hrs (half home dose as she was drowsy on exam) - Tylenol 650mg  PO q6hours  Anxiety: treated with Valium 5mg  TID at home - Due to patient's small stature and drowsiness, doses here in the hospital have been reduced by ~1/2 - Watch for signs of Benzo withdrawal  Overactive Bladder: Chronic - Cont Darifenacin 15mg  PO qDaily  Asthma: Mild - Albuterol nebulizer prn  GERD: Chronic Patient reports history of an ulcer - Pantoprazole PRN - Zofran  Eczema, chronic, stable: Clobetasol creme  FEN/GI: regular diet, zofran Prophylaxis: SCDs  Disposition: pending clinical improvement  Subjective:  Patient states that she is feeling "much better."  Notes some nausea that has resulted in decreased PO intake.  Objective: Temp:  [98.3 F (36.8 C)-100.9 F (38.3 C)] 98.6 F (37 C) (07/22 0600) Pulse Rate:  [83-101] 83 (07/21 1725) Resp:  [17-21] 17 (07/22 0600) BP: (101-127)/(65-91) 122/78 (07/22 0600) SpO2:  [98 %-99 %] 98 % (07/22 0600)  Physical Exam: General: 31yo female in NAD, sitting with head of bed elevated Head: Atraumatic with dry scalp Eyes: EOM intact Neck: Limited cervical ROM 2/2 spinal cord injury 2005 Cardio: RRR, no m/r/g Lungs: CTAB, no wheezing, rhonchi, crackles Abdominal: Soft, non-tender, non-distended, + bowel sounds, suprapubic catheter without erythema or drainage  MSK: BLE grossly deformed, limited ROM 2/2 spinal injury, 3+ pitting edema of feet,  stable Neuro: A&Ox3, no sensory deficit Skin: Erythema of right anterior thigh appears within marked borders, mild warmth to palpation Psych: Mood and affect appropriate for circumstance   Laboratory: Recent Labs  Lab 07/10/18 0751 07/10/18 1904 07/11/18 0413  WBC 4.3 8.5 8.0  HGB 6.9* 11.8* 12.4  HCT 23.8* 37.2 38.8  PLT 116*  146* 162   Recent Labs  Lab 07/09/18 1236 07/10/18 0612 07/11/18 0413  NA 135 136 136  K 3.6 3.0* 4.0  CL 101 109 104  CO2 24 23 25   BUN 9 10 5*  CREATININE 0.82 0.70 0.48  CALCIUM 7.8* 7.3* 7.9*  PROT 7.6 6.1*  --   BILITOT 0.9 0.5  --   ALKPHOS 56 50  --   ALT 12 12  --   AST 26 26  --   GLUCOSE 127* 107* 94   MRSA (7/19): negative APTT (7/19): 34 nml Procalcitonin (7/19): 4.89 Lactic Acid: 1.9 > 0.49 (low) UA: hazy amber with 100 protein, mod leuks, few bacteria, few epithelial cells  Imaging/Diagnostic Tests: Dg Chest Port 1 View  Result Date: 07/09/2018 CLINICAL DATA:  Fever EXAM: PORTABLE CHEST 1 VIEW COMPARISON:  05/18/2018 FINDINGS: Right perihilar density similar to prior studies including CT chest 01/24/2011 most compatible with scarring. Negative for pneumonia. No effusion. Cervical and thoracic fusion hardware unchanged. IMPRESSION: Right perihilar scarring.  Negative for pneumonia. Electronically Signed   By: Marlan Palauharles  Clark M.D.   On: 07/09/2018 12:51   Rittberger, Solmon IceBailey J, DO 07/12/2018, 7:48 AM PGY-1,  Family Medicine FPTS Intern pager: [phone], text pages welcome
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
## 2018-07-13 ENCOUNTER — Encounter (HOSPITAL_COMMUNITY): Payer: Self-pay | Admitting: Family Medicine

## 2018-07-13 ENCOUNTER — Inpatient Hospital Stay (HOSPITAL_COMMUNITY): Payer: Medicare Other

## 2018-07-13 DIAGNOSIS — M86251 Subacute osteomyelitis, right femur: Secondary | ICD-10-CM

## 2018-07-13 DIAGNOSIS — L8944 Pressure ulcer of contiguous site of back, buttock and hip, stage 4: Secondary | ICD-10-CM

## 2018-07-13 DIAGNOSIS — M869 Osteomyelitis, unspecified: Secondary | ICD-10-CM

## 2018-07-13 DIAGNOSIS — M8618 Other acute osteomyelitis, other site: Secondary | ICD-10-CM

## 2018-07-13 HISTORY — DX: Osteomyelitis, unspecified: M86.9

## 2018-07-13 LAB — BASIC METABOLIC PANEL
Anion gap: 10 (ref 5–15)
BUN: 10 mg/dL (ref 6–20)
CALCIUM: 8.1 mg/dL — AB (ref 8.9–10.3)
CO2: 25 mmol/L (ref 22–32)
CREATININE: 0.39 mg/dL — AB (ref 0.44–1.00)
Chloride: 106 mmol/L (ref 98–111)
Glucose, Bld: 91 mg/dL (ref 70–99)
Potassium: 5.8 mmol/L — ABNORMAL HIGH (ref 3.5–5.1)
SODIUM: 141 mmol/L (ref 135–145)

## 2018-07-13 MED ORDER — SODIUM CHLORIDE 0.9 % IV SOLN
INTRAVENOUS | Status: DC | PRN
  Administered 2018-07-13: 10:00:00 via INTRAVENOUS

## 2018-07-13 MED ORDER — SODIUM CHLORIDE 0.9 % IV SOLN
2.0000 g | INTRAVENOUS | Status: DC
  Administered 2018-07-13 – 2018-07-14 (×2): 2 g via INTRAVENOUS
  Filled 2018-07-13 (×3): qty 20

## 2018-07-13 MED ORDER — VANCOMYCIN HCL IN DEXTROSE 750-5 MG/150ML-% IV SOLN
750.0000 mg | INTRAVENOUS | Status: DC
  Administered 2018-07-13: 750 mg via INTRAVENOUS
  Filled 2018-07-13 (×2): qty 150

## 2018-07-13 MED ORDER — GADOBENATE DIMEGLUMINE 529 MG/ML IV SOLN
10.0000 mL | Freq: Once | INTRAVENOUS | Status: AC
  Administered 2018-07-13: 10 mL via INTRAVENOUS

## 2018-07-13 MED ORDER — GADOBENATE DIMEGLUMINE 529 MG/ML IV SOLN
10.0000 mL | Freq: Once | INTRAVENOUS | Status: AC
  Administered 2018-07-13: 8 mL via INTRAVENOUS

## 2018-07-13 NOTE — Progress Notes (Signed)
[facility] Infusion Coordinator will follow pt with ID team to support Home Infusion Pharmacy services for home IV ABX at DC.   If patient discharges after hours, please call [phone].   [name] 07/13/2018, 11:09 PM

## 2018-07-13 NOTE — Progress Notes (Signed)
Pharmacy Antibiotic Note  Stacy CraftBritany D Carlson is a 32 y.o. female admitted on 07/09/2018 with osteomyelitis.  Pharmacy has been consulted for Vancomycin dosing.  Pt has a spinal cord injury and resulting quardaplegia, low muscle mass / body weight, and low SCr making medication dosing challenging.    Plan: Vancomycin 750mg  IV q24h Goal trough 15-20 mcg/ml Will check a level on this regimen soon to assess dosing.  Height: 4\' 8"  (142.2 cm) Weight: 87 lb 1.3 oz (39.5 kg) IBW/kg (Calculated) : 36.3  Temp (24hrs), Avg:98.4 F (36.9 C), Min:98.1 F (36.7 C), Max:98.9 F (37.2 C)  Recent Labs  Lab 07/09/18 1236 07/09/18 1254 07/09/18 1527 07/10/18 0612 07/10/18 0751 07/10/18 1904 07/11/18 0413 07/12/18 0650 07/13/18 0925  WBC 9.6  --   --  5.0 4.3 8.5 8.0 6.0  --   CREATININE 0.82  --   --  0.70  --   --  0.48 0.52 0.39*  LATICACIDVEN  --  1.90 0.49*  --   --   --   --   --   --     Estimated Creatinine Clearance: 58.4 mL/min (A) (by C-G formula based on SCr of 0.39 mg/dL (L)).    Allergies  Allergen Reactions  . Metoclopramide Hcl Other (See Comments)    Seizures   . Benadryl [Diphenhydramine Hcl] Rash  . Gabapentin Swelling  . Morphine Rash  . Vancomycin Itching and Rash    Pt states she is not aware of this allergy.   Laqueta Jean. Zosyn [Piperacillin-Tazobactam In Dex] Itching and Rash    Pt states that she is not aware of this allergy.     Antimicrobials this admission: Ceftriaxone 7/19 x1 Merrem 7/19 >> Vanc 7/23 >>  Dose adjustments this admission: n/a  Microbiology results: 7/19 BCx: negative 7/20 UCx: negative 7/19 MRSA PCR: negative  Thank you for allowing pharmacy to be a part of this patient's care.  Toys 'R' UsKimberly Tiyanna Larcom, Pharm.D., BCPS Clinical Pharmacist Pager: [phone] Clinical phone for 07/13/2018 is [phone].  **Pharmacist phone directory can now be found on [email or web address] ([email or web address]).  Listed under [facility].  07/13/2018 3:46 PM
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
## 2018-07-13 NOTE — Progress Notes (Signed)
   Patient Status: MC IP  Assessment and Plan: Patient in need of venous access.   Peripherally inserted central catheter placement  ______________________________________________________________________   History of Present Illness: [name] is a 32 y.o. female   Quadriplegia Fever; UTI; sacral ulcer + osteomyelitis Need for long term antibiotics Cr wnl Pt requests IR to place PICC  Allergies and medications reviewed.   Review of Systems: A 12 point ROS discussed and pertinent positives are indicated in the HPI above.  All other systems are negative.   Vital Signs: BP 99/65 (BP Location: Left Arm)   Pulse 77   Temp 98.3 F (36.8 C) (Oral)   Resp 16   Ht 4\' 8"  (1.422 m)   Wt 87 lb 1.3 oz (39.5 kg)   SpO2 98%   BMI 19.52 kg/m    Imaging reviewed.   Labs:  COAGS: Recent Labs    07/09/18 1607  INR 1.52  APTT 34    BMP: Recent Labs    07/10/18 0612 07/11/18 0413 07/12/18 0650 07/13/18 0925  NA 136 136 140 141  K 3.0* 4.0 4.5 5.8*  CL 109 104 105 106  CO2 23 25 27 25   GLUCOSE 107* 94 110* 91  BUN 10 5* 7 10  CALCIUM 7.3* 7.9* 8.0* 8.1*  CREATININE 0.70 0.48 0.52 0.39*  GFRNONAA >60 >60 >60 >60  GFRAA >60 >60 >60 >60    Pt is aware of procedure benefits and risks including but not limited to Infection; bleeding; vessel damage Agreeable to proceed Consent signed and in chart   Electronically Signed: Pyper Olexa A, PA-C 07/13/2018, 4:07 PM   I spent a total of 15 minutes in face to face in clinical consultation, greater than 50% of which was counseling/coordinating care for venous access.[name], female   DOB: 1986/09/28, 32 y.o.   MRN: [id number]

## 2018-07-13 NOTE — Progress Notes (Signed)
[facility] Coordinator will follow pt with ID team to support Home Infusion Pharmacy services at [place] for home IV ABX.  [facility] team will work with patient's Home Health agency of choice.   If patient discharges after hours, please call [phone].   Sedalia Mutaamela S Chandler 07/13/2018, 11:19 PM

## 2018-07-14 ENCOUNTER — Inpatient Hospital Stay (HOSPITAL_COMMUNITY): Payer: Medicare Other

## 2018-07-14 DIAGNOSIS — R110 Nausea: Secondary | ICD-10-CM

## 2018-07-14 LAB — CBC
HEMATOCRIT: 44 % (ref 36.0–46.0)
HEMOGLOBIN: 13.8 g/dL (ref 12.0–15.0)
MCH: 24.8 pg — ABNORMAL LOW (ref 26.0–34.0)
MCHC: 31.4 g/dL (ref 30.0–36.0)
MCV: 79 fL (ref 78.0–100.0)
Platelets: 235 10*3/uL (ref 150–400)
RBC: 5.57 MIL/uL — ABNORMAL HIGH (ref 3.87–5.11)
RDW: 22.5 % — AB (ref 11.5–15.5)
WBC: 7.3 10*3/uL (ref 4.0–10.5)

## 2018-07-14 LAB — BASIC METABOLIC PANEL
ANION GAP: 13 (ref 5–15)
BUN: 10 mg/dL (ref 6–20)
CHLORIDE: 97 mmol/L — AB (ref 98–111)
CO2: 30 mmol/L (ref 22–32)
Calcium: 8.8 mg/dL — ABNORMAL LOW (ref 8.9–10.3)
Creatinine, Ser: 0.42 mg/dL — ABNORMAL LOW (ref 0.44–1.00)
GFR calc Af Amer: >60 mL/min (ref >60)
GLUCOSE: 98 mg/dL (ref 70–99)
POTASSIUM: 4.6 mmol/L (ref 3.5–5.1)
Sodium: 140 mmol/L (ref 135–145)

## 2018-07-14 LAB — CULTURE, BLOOD (ROUTINE X 2)
Culture: NO GROWTH
Culture: NO GROWTH
SPECIAL REQUESTS: ADEQUATE

## 2018-07-14 MED ORDER — IOPAMIDOL (ISOVUE-300) INJECTION 61%
INTRAVENOUS | Status: AC
  Administered 2018-07-14: 5 mL
  Filled 2018-07-14: qty 50

## 2018-07-14 MED ORDER — ENSURE ENLIVE PO LIQD
237.0000 mL | Freq: Three times a day (TID) | ORAL | Status: DC
  Administered 2018-07-14: 237 mL via ORAL

## 2018-07-14 MED ORDER — VANCOMYCIN IV (FOR PTA / DISCHARGE USE ONLY): 750.0000 mg | Freq: Two times a day (BID) | INTRAVENOUS | 0 refills | Status: AC

## 2018-07-14 MED ORDER — HEPARIN SOD (PORK) LOCK FLUSH 100 UNIT/ML IV SOLN
500.0000 [IU] | Freq: Once | INTRAVENOUS | Status: AC
  Administered 2018-07-14: 500 [IU] via INTRAVENOUS
  Filled 2018-07-14: qty 5

## 2018-07-14 MED ORDER — HYDROMORPHONE HCL 2 MG PO TABS: 2.0000 mg | ORAL_TABLET | Freq: Four times a day (QID) | ORAL | 0 refills | Status: DC | PRN

## 2018-07-14 MED ORDER — ENSURE ENLIVE PO LIQD: 237.0000 mL | Freq: Three times a day (TID) | ORAL | 12 refills | Status: DC

## 2018-07-14 MED ORDER — DIAZEPAM 5 MG PO TABS: 2.5000 mg | ORAL_TABLET | Freq: Three times a day (TID) | ORAL | 0 refills | Status: DC

## 2018-07-14 MED ORDER — ADULT MULTIVITAMIN W/MINERALS CH
1.0000 | ORAL_TABLET | Freq: Every day | ORAL | 0 refills | Status: DC
Start: 2018-07-15 — End: 2019-03-24

## 2018-07-14 MED ORDER — VANCOMYCIN HCL IN DEXTROSE 750-5 MG/150ML-% IV SOLN
750.0000 mg | Freq: Two times a day (BID) | INTRAVENOUS | Status: DC
  Administered 2018-07-14 (×2): 750 mg via INTRAVENOUS
  Filled 2018-07-14 (×2): qty 150

## 2018-07-14 MED ORDER — ADULT MULTIVITAMIN W/MINERALS CH
1.0000 | ORAL_TABLET | Freq: Every day | ORAL | Status: DC
  Administered 2018-07-14: 1 via ORAL
  Filled 2018-07-14: qty 1

## 2018-07-14 MED ORDER — LIDOCAINE HCL 1 % IJ SOLN
INTRAMUSCULAR | Status: AC
  Filled 2018-07-14: qty 20

## 2018-07-14 MED ORDER — CEFTRIAXONE IV (FOR PTA / DISCHARGE USE ONLY): 2.0000 g | INTRAVENOUS | 0 refills | Status: AC

## 2018-07-14 MED ORDER — LIDOCAINE HCL (PF) 1 % IJ SOLN
INTRAMUSCULAR | Status: AC | PRN
  Administered 2018-07-14: 10 mL

## 2018-07-14 NOTE — Procedures (Signed)
Pre procedural Diagnosis: Poor venous access Post Procedural Diagnosis: Same  Successful placement of left basilic vein approach 17 cm midline dual lumen PICC line with tip within the mid left subclavian vein. EBL: None  No immediate post procedural complication.  The PICC line is ready for immediate use.  Katherina RightJay Mea Ozga, MD Pager #: [phone]

## 2018-07-14 NOTE — Progress Notes (Signed)
Family Medicine Teaching Service Daily Progress Note Intern Pager: [phone]  Patient name: [name] Grigorian Medical record number: [id number] Date of birth: 01-Sep-1986 Age: 32 y.o. Gender: female  Primary Care Provider: Norval GableIddyadinesh, Sanjana, DO Consultants: None Code Status: Full  Pt Overview and Major Events to Date:  7/19 - Admit 7/20 transfused 2u RBC  Assessment and Plan: [name] Haque is a 32 y.o. female presenting with fever, back pain, malaise, and dysuria. PMH is significant for spinal injury, recurrent UTIs, kidney stones, and chronic pain.   Sepsis: 2/2 UTI vs Osteomyelitis: Improving New left greater trochanter osteomyelitis per MRI.  Started on Vanc and Rocephin per ID.  Patient requests PICC placement per IR.  Lost IV access this AM. - Vanc and Rocephin per ID - PICC placement today - Urine Cx No growth final result - Blood cx pending (ng x4day) - likely home following PICC placement  Osteomyelitis Left greater trochanter: New, Right greater trochanter: Improving MRI shows new osteomyelitis of left greater trochanter, and osteomyelitis of left greater trochanter that has improved since last imaging.  ID does not recommend bone biopsy as patient has been on meropenem.  Pressure ulcer on left greater trochanter. - on Vanc and Rocephin per ID - getting PICC placement today with IR - continue wound care  Anterior Right Thigh Cellulitis Confirmed on MRI of right femur. -Vanc and Rocephin per ID - PICC placement today  Anemia: Stable Hgb 13.8 this AM. -cont monitoring CBC  AKI: Resolved Basline 0.4.  Cr 0.42 this AM. 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 Sensation intact. - Lyrica 75 PO TID (home dose) - Dilaudid 2mg   PO q6 hrs (half home dose as she was drowsy on exam) - Tylenol 650mg  PO q6hours  Anxiety: Stable Treated with Valium 5mg  TID at home - continue Valium 2.5mg  TID  - cont to monitor for signs of Benzo withdrawal  Overactive Bladder: Chronic - Cont Darifenacin 15mg  PO qDaily  Asthma: Mild - Albuterol nebulizer prn  GERD: Chronic Patient reports history of an ulcer - Pantoprazole PRN - Zofran  Eczema: Chronic, stable - cont Clobetasol creme  FEN/GI: regular diet, zofran Prophylaxis: SCDs  Disposition: home with PICC  Subjective:  Patient feeling well today.  Some nausea that is common per patient.   Objective: Temp:  [97.9 F (36.6 C)-98.6 F (37 C)] 98.3 F (36.8 C) (07/24 0759) Pulse Rate:  [56-62] 61 (07/24 0759) Resp:  [19-30] 27 (07/24 0759) BP: (133-151)/(81-99) 136/99 (07/24 0759) SpO2:  [93 %-99 %] 99 % (07/24 0759) Weight:  [88 lb 10 oz (40.2 kg)] 88 lb 10 oz (40.2 kg) (07/24 0459)   Physical Exam: General: 32 yo female, sitting with head of bed elevated, in NAD Head: Dry scalp Cardio: RRR no m/r/g Lungs: CTAB, no wheezing, crackles Abdominal: Soft, non-tender, + bowel sounds MSK: BLE grossly deformed, limited ROM 2/2 spinal injury, 2+ pitting edema of feet, stable Neuro: A&Ox3, sensation intact Skin: Erythema of right thigh improving, within distal border, no increased warmth to palpation Psych: Mood and affect appropriate for circumstance     Laboratory: Recent Labs  Lab 07/11/18 0413 07/12/18 0650 07/14/18 0457  WBC 8.0 6.0 7.3  HGB 12.4 11.5* 13.8  HCT 38.8 36.3 44.0  PLT 162 155 235  Recent Labs  Lab 07/09/18 1236 07/10/18 0612  07/12/18 0650 07/13/18 0925 07/14/18 0353  NA 135 136   < > 140 141 140  K 3.6 3.0*   < > 4.5 5.8* 4.6  CL 101 109   < > 105 106 97*  CO2 24 23   < > 27 25 30   BUN 9 10   < > 7 10 10   CREATININE 0.82 0.70   < > 0.52 0.39* 0.42*  CALCIUM 7.8* 7.3*   < > 8.0* 8.1* 8.8*  PROT 7.6 6.1*  --   --   --    --   BILITOT 0.9 0.5  --   --   --   --   ALKPHOS 56 50  --   --   --   --   ALT 12 12  --   --   --   --   AST 26 26  --   --   --   --   GLUCOSE 127* 107*   < > 110* 91 98   < > = values in this interval not displayed.   MRSA (7/19): negative APTT (7/19): 34 nml Procalcitonin (7/19): 4.89 Lactic Acid: 1.9 > 0.49 (low) UA: hazy amber with 100 protein, mod leuks, few bacteria, few epithelial cells  Imaging/Diagnostic Tests: Mr Pelvis W Wo Contrast  Result Date: 07/13/2018 CLINICAL DATA:  Quadriplegic patient with decubitus ulcers. Question abscess or osteomyelitis. EXAM: MRI OF THE BILATERAL HIPS WITH CONTRAST TECHNIQUE: Multiplanar, multisequence MR imaging was performed following the administration of intravenous contrast. CONTRAST:  8 ml  MULTIHANCE GADOBENATE DIMEGLUMINE 529 MG/ML IV SOLN COMPARISON:  None. MRI of the pelvis 01/19/2018. FINDINGS: Bones: New marrow edema and enhancement in the left greater trochanter subjacent to an ulcer consistent with osteomyelitis are identified. Previously seen marrow edema and enhancement in the right greater trochanter persists but has markedly improved. The coccyx is markedly diminutive and sclerotic consistent with chronic/remote osteomyelitis, unchanged. Decreased T1 and T2 signal in the ischial tuberosities bilaterally consistent with chronic/remote osteomyelitis is unchanged. No joint effusion is seen. Articular cartilage and labrum Articular cartilage:  Preserved. Labrum:  Intact. Joint or bursal effusion Joint effusion:  None. Bursae: Negative. Muscles and tendons Muscles and tendons: Musculature is atrophic throughout no intramuscular fluid collection is identified. Other findings Miscellaneous: No soft tissue abscess is identified. Decubitus ulcers over the ischial tuberosities and sacrum are seen. Fairly extensive subcutaneous edema is present throughout. Enhancement in subcutaneous fat is seen along the anterior aspect of the right upper leg.  Imaged intrapelvic contents demonstrate no acute abnormality. Suprapubic catheter is in place. IMPRESSION: Osteomyelitis in the left greater trochanter subjacent to a large decubitus ulcer is new since the prior examination. Mild edema and enhancement in the right greater trochanter consistent osteomyelitis have markedly improved since the prior MRI. Diffuse subcutaneous edema could be due to volume overload and dependent change. Negative for soft tissue abscess or septic joint. Electronically Signed   By: Drusilla Kanner M.Carlson.   On: 07/13/2018 15:01   Mr Femur Right W Wo Contrast  Result Date: 07/13/2018 CLINICAL DATA:  Quadriplegic patient with multiple decubitus ulcers. Question myositis. EXAM: MRI OF THE RIGHT FEMUR WITHOUT AND WITH CONTRAST TECHNIQUE: Multiplanar, multisequence MR imaging of the right femur was performed both before and after administration of intravenous contrast. CONTRAST:  10 ml MULTIHANCE GADOBENATE DIMEGLUMINE 529 MG/ML IV SOLN COMPARISON:  None. FINDINGS: Bones/Joint/Cartilage No bone marrow signal abnormality to suggest osteomyelitis is  identified. Note is made that the left thigh is incidentally imaged on the axial and coronal sequences this exam. Ligaments Negative. Muscles and Tendons No intramuscular fluid collection is identified. Musculature is mildly atrophic. Intrasubstance increased T2 signal is seen in the adductor magnus muscles bilaterally and there is mild edema in the quadriceps muscles bilaterally, more notable on the left. No enhancement after contrast administration is identified. Soft tissues Extensive subcutaneous edema is present bilaterally with scattered enhancement of subcutaneous fatty tissues of the right upper leg. No abscess is seen. IMPRESSION: Subcutaneous edema bilaterally is consistent with dependent change. Enhancement in subcutaneous fatty tissues of the right upper leg is consistent with cellulitis. Negative for abscess or osteomyelitis. Mildly  increased T2 signal in the adductor magnus muscles bilaterally without enhancement is likely secondary to atrophy. No intramuscular abscess or enhancement suggest infectious myositis is identified. Electronically Signed   By: Drusilla Kanner M.Carlson.   On: 07/13/2018 15:08   Dg Chest Port 1 View  Result Date: 07/09/2018 CLINICAL DATA:  Fever EXAM: PORTABLE CHEST 1 VIEW COMPARISON:  05/18/2018 FINDINGS: Right perihilar density similar to prior studies including CT chest 01/24/2011 most compatible with scarring. Negative for pneumonia. No effusion. Cervical and thoracic fusion hardware unchanged. IMPRESSION: Right perihilar scarring.  Negative for pneumonia. Electronically Signed   By: Marlan Palau M.Carlson.   On: 07/09/2018 12:51   Rittberger, Solmon Ice, DO 07/14/2018, 9:36 AM PGY-1, [facility] FPTS Intern pager: [phone], text pages welcome

## 2018-07-14 NOTE — Care Management Important Message (Signed)
Important Message  Patient Details  Name: [name] MRN: [id number] Date of Birth: [birth year]   Medicare Important Message Given:  Yes    Gala LewandowskyGraves-Bigelow, Denny Mccree Kaye, RN 07/14/2018, 3:53 PM

## 2018-07-14 NOTE — Progress Notes (Signed)
PHARMACY CONSULT NOTE FOR:  OUTPATIENT  PARENTERAL ANTIBIOTIC THERAPY (OPAT)  Indication: Osteomyelitis  Regimen: ceftriaxone 2g Q24hr; Vancomycin 750mg  Q12 hours End date: 08/19/18 for ceftriaxone; 08/24/18 for vancomycin  IV antibiotic discharge orders are pended. To discharging provider:  please sign these orders via discharge navigator,  Select New Orders & click on the button choice - Manage This Unsigned Work.     Thank you for allowing pharmacy to be a part of this patient's care.  Amanda PeaAnna Love, Ilda BassetPharm D PGY1 Pharmacy Resident  Phone [phone] 07/14/2018      10:48 AM

## 2018-07-14 NOTE — Care Management Note (Addendum)
Case Management Note  Patient Details  Name: [name] MRN: [id number] Date of Birth: [birth year]  Subjective/Objective: Pt presented for Sepsis- Plan for Home IV antibiotics. [facility]                    [facility]: [facility] and no [facility] needs. Pt will need [facility] for transport home. No further needs from CM at this time.   Expected Discharge Date:  07/14/18               Expected Discharge Plan:  Home w Home Health Services  In-House Referral:  NA  Discharge planning Services  CM Consult  Post Acute Care Choice:  Home Health Choice offered to:  Patient  DME Arranged:  IV pump/equipment DME Agency:  [facility]  HH Arranged:  RN, Disease Management, PT, IV Antibiotics HH Agency:  [facility]  Status of Service:  Completed, signed off  If discussed at Long Length of Stay Meetings, dates discussed:    Additional Comments: 1603 07-14-18 Tomi BambergerBrenda Graves-Bigelow, RN,BSN [phone] [facility] called for ambulance pickup for home. 21 calls ahead of patient. Asked to pick up patient at 6:00 pm. No further needs from CM at this time.  Gala LewandowskyGraves-Bigelow, Leviticus Harton Kaye, RN 07/14/2018, 3:47 PM

## 2018-07-14 NOTE — Progress Notes (Addendum)
Nutrition Follow-up / Consult  DOCUMENTATION CODES:   Not applicable  INTERVENTION:    Add Ensure Enlive po TID, each supplement provides 350 kcal and 20 grams of protein  Multivitamin daily  D/C Juven  NUTRITION DIAGNOSIS:   Increased nutrient needs related to wound healing as evidenced by estimated needs.  Ongoing   GOAL:   Patient will meet greater than or equal to 90% of their needs  Progressing  MONITOR:   PO intake, Supplement acceptance, Skin, Labs  REASON FOR ASSESSMENT:   Consult Assessment of nutrition requirement/status  ASSESSMENT:   32 yo female with PMH of C5-C7 spinal cord injury (2005), quadriplegia, recurrent UTIs, asthma, absent R kidney who was admitted on 7/19 with CAUTI and ESBL Ecoli of hip wound.  MRI confirmed osteomyelitis of the left greater trochanter. Plans for PICC placement today for IV antibiotic therapy; will need IV antibiotics for 6 weeks. Patient reports that she has had some nausea this morning, she thinks due to IV stopped and no longer receiving IV antibiotics. She does not like the Juven supplements and doesn't think she can drink them anymore. She agreed to drink Ensure Enlive supplements TID between meals to maximize intake of protein and calories.  Labs and medications reviewed.    Diet Order:   Diet Order           Diet regular Room service appropriate? Yes; Fluid consistency: Thin  Diet effective now          EDUCATION NEEDS:   Education needs have been addressed(discussed ways to increase protein and calorie intake)  Skin:  Skin Assessment: Skin Integrity Issues: Skin Integrity Issues:: DTI, Stage II, Stage III, Stage IV, Other (Comment) DTI: left & right heels Stage II: left hip Stage III: labial folds, perineum, left hip Stage IV: sacrum, left and right ischial tuberosity Unstageable: N/A Other: pressure injury R thigh (where laptop rests)  Last BM:  7/24 (type 2)  Height:   Ht Readings from Last 1  Encounters:  07/10/18 4\' 8"  (1.422 m)    Weight:   Wt Readings from Last 1 Encounters:  07/14/18 88 lb 10 oz (40.2 kg)    Ideal Body Weight:  38.2 kg  BMI:  Body mass index is 19.87 kg/m.  Estimated Nutritional Needs:   Kcal:  1300-1500  Protein:  70-80 gm  Fluid:  >/= 1.3 L    Joaquin CourtsKimberly Zenaya Ulatowski, RD, LDN, CNSC Pager [phone] After Hours Pager [phone]

## 2018-07-14 NOTE — Progress Notes (Signed)
All wounds cleansed and dressings changed.

## 2018-07-14 NOTE — Progress Notes (Signed)
Pharmacy Antibiotic Note  [name] is a 32 y.o. female admitted on 07/09/2018 with osteomyelitis.  Pharmacy has been consulted for Vancomycin dosing.  Pt has a spinal cord injury and resulting quardaplegia, low muscle mass / body weight, and low SCr making medication dosing challenging.    CrCl is estimated >12500ml/min. Patient has good urine output. Patient may need every 8 hour dosing. Patient is being discharged and will be able to check trough Thursday before 4th dose. Patient will continue therapy for 6 weeks out patient.   Plan: Vancomycin 750mg  IV q12h Goal trough 15-20 mcg/ml Check trough 7/25 before fourth dose  Ceftriaxone 2g every 24hr per ID team  Height: 4\' 8"  (142.2 cm) Weight: 88 lb 10 oz (40.2 kg) IBW/kg (Calculated) : 36.3  Temp (24hrs), Avg:98.2 F (36.8 C), Min:97.9 F (36.6 C), Max:98.6 F (37 C)  Recent Labs  Lab 07/09/18 1254 07/09/18 1527 07/10/18 0612 07/10/18 0751 07/10/18 1904 07/11/18 0413 07/12/18 0650 07/13/18 0925 07/14/18 0353 07/14/18 0457  WBC  --   --  5.0 4.3 8.5 8.0 6.0  --   --  7.3  CREATININE  --   --  0.70  --   --  0.48 0.52 0.39* 0.42*  --   LATICACIDVEN 1.90 0.49*  --   --   --   --   --   --   --   --     Estimated Creatinine Clearance: 58.4 mL/min (A) (by C-G formula based on SCr of 0.42 mg/dL (L)).    Allergies  Allergen Reactions  . Metoclopramide Hcl Other (See Comments)    Seizures   . Benadryl [Diphenhydramine Hcl] Rash  . Gabapentin Swelling  . Morphine Rash  . Vancomycin Itching and Rash    Pt states she is not aware of this allergy.   Laqueta Jean. Zosyn [Piperacillin-Tazobactam In Dex] Itching and Rash    Pt states that she is not aware of this allergy.     Antimicrobials this admission: Ceftriaxone 7/19 x1 7/23>> Merrem 7/19 >>7/23 Vanc 7/23 >>  Dose adjustments this admission: n/a  Microbiology results: 7/19 BCx: negative 7/20 UCx: negative 7/19 MRSA PCR: negative  Thank you for allowing pharmacy to  be a part of this patient's care.  Amanda PeaAnna Albena Comes, Ilda BassetPharm D PGY1 Pharmacy Resident  Phone [phone] 07/14/2018      10:41 AM  **Pharmacist phone directory can now be found on [email or web address] ([email or web address]).  Listed under [facility].

## 2018-07-14 NOTE — Discharge Instructions (Signed)
We highly recommend you decreasing the amount of Dilaudid and Valium that you take.  It is very dangerous to take these together and especially at the high doses that you were on.
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
## 2018-07-15 DIAGNOSIS — M869 Osteomyelitis, unspecified: Secondary | ICD-10-CM

## 2018-07-15 NOTE — Progress Notes (Signed)
Pt picked up by [facility] to home.

## 2018-07-22 ENCOUNTER — Encounter (HOSPITAL_BASED_OUTPATIENT_CLINIC_OR_DEPARTMENT_OTHER): Payer: Medicare Other

## 2018-07-23 ENCOUNTER — Other Ambulatory Visit: Payer: Self-pay

## 2018-07-23 ENCOUNTER — Emergency Department (HOSPITAL_COMMUNITY)
Admission: EM | Admit: 2018-07-23 | Discharge: 2018-07-24 | Disposition: A | Discharge status: Home / Self Care | Payer: Medicare Other | Attending: Emergency Medicine | Admitting: Emergency Medicine

## 2018-07-23 ENCOUNTER — Encounter (HOSPITAL_COMMUNITY): Payer: Self-pay | Admitting: Emergency Medicine

## 2018-07-23 DIAGNOSIS — L24A9 Irritant contact dermatitis due friction or contact with other specified body fluids: Secondary | ICD-10-CM

## 2018-07-23 DIAGNOSIS — T82898A Other specified complication of vascular prosthetic devices, implants and grafts, initial encounter: Secondary | ICD-10-CM | POA: Diagnosis present

## 2018-07-23 DIAGNOSIS — J45909 Unspecified asthma, uncomplicated: Secondary | ICD-10-CM | POA: Diagnosis not present

## 2018-07-23 DIAGNOSIS — Y713 Surgical instruments, materials and cardiovascular devices (including sutures) associated with adverse incidents: Secondary | ICD-10-CM | POA: Insufficient documentation

## 2018-07-23 DIAGNOSIS — Z79899 Other long term (current) drug therapy: Secondary | ICD-10-CM | POA: Insufficient documentation

## 2018-07-23 DIAGNOSIS — T148XXA Other injury of unspecified body region, initial encounter: Secondary | ICD-10-CM

## 2018-07-23 DIAGNOSIS — R609 Edema, unspecified: Secondary | ICD-10-CM

## 2018-07-23 LAB — URINALYSIS, ROUTINE W REFLEX MICROSCOPIC
Bilirubin Urine: NEGATIVE
Glucose, UA: NEGATIVE mg/dL
Ketones, ur: NEGATIVE mg/dL
Nitrite: NEGATIVE
PH: 8 (ref 5.0–8.0)
Protein, ur: NEGATIVE mg/dL
Specific Gravity, Urine: 1.003 — ABNORMAL LOW (ref 1.005–1.030)

## 2018-07-23 LAB — CBC WITH DIFFERENTIAL/PLATELET
BASOS PCT: 2 %
Basophils Absolute: 0.1 10*3/uL (ref 0.0–0.1)
EOS ABS: 0.2 10*3/uL (ref 0.0–0.7)
EOS PCT: 3 %
HEMATOCRIT: 37.9 % (ref 36.0–46.0)
HEMOGLOBIN: 11.5 g/dL — AB (ref 12.0–15.0)
Lymphocytes Relative: 37 %
Lymphs Abs: 2 10*3/uL (ref 0.7–4.0)
MCH: 25.4 pg — AB (ref 26.0–34.0)
MCHC: 30.3 g/dL (ref 30.0–36.0)
MCV: 83.8 fL (ref 78.0–100.0)
MONOS PCT: 7 %
Monocytes Absolute: 0.4 10*3/uL (ref 0.1–1.0)
NEUTROS ABS: 2.6 10*3/uL (ref 1.7–7.7)
Neutrophils Relative %: 51 %
Platelets: 252 10*3/uL (ref 150–400)
RBC: 4.52 MIL/uL (ref 3.87–5.11)
RDW: 23.4 % — ABNORMAL HIGH (ref 11.5–15.5)
WBC: 5.3 10*3/uL (ref 4.0–10.5)

## 2018-07-23 LAB — COMPREHENSIVE METABOLIC PANEL
ALT: 10 U/L (ref 0–44)
AST: 16 U/L (ref 15–41)
Albumin: 3.1 g/dL — ABNORMAL LOW (ref 3.5–5.0)
Alkaline Phosphatase: 60 U/L (ref 38–126)
Anion gap: 10 (ref 5–15)
BILIRUBIN TOTAL: 0.6 mg/dL (ref 0.3–1.2)
BUN: 13 mg/dL (ref 6–20)
CALCIUM: 8.6 mg/dL — AB (ref 8.9–10.3)
CO2: 25 mmol/L (ref 22–32)
Chloride: 101 mmol/L (ref 98–111)
Creatinine, Ser: 0.51 mg/dL (ref 0.44–1.00)
GFR calc Af Amer: >60 mL/min (ref >60)
Glucose, Bld: 86 mg/dL (ref 70–99)
Potassium: 4 mmol/L (ref 3.5–5.1)
Sodium: 136 mmol/L (ref 135–145)
TOTAL PROTEIN: 7.5 g/dL (ref 6.5–8.1)

## 2018-07-23 LAB — I-STAT BETA HCG BLOOD, ED (MC, WL, AP ONLY)

## 2018-07-23 LAB — I-STAT CG4 LACTIC ACID, ED: Lactic Acid, Venous: 0.55 mmol/L (ref 0.5–1.9)

## 2018-07-23 MED ORDER — ONDANSETRON HCL 4 MG/2ML IJ SOLN
4.0000 mg | Freq: Once | INTRAMUSCULAR | Status: AC
  Administered 2018-07-23: 4 mg via INTRAVENOUS
  Filled 2018-07-23: qty 2

## 2018-07-23 MED ORDER — HYDROMORPHONE HCL 1 MG/ML IJ SOLN
0.5000 mg | Freq: Once | INTRAMUSCULAR | Status: AC
  Administered 2018-07-23: 0.5 mg via INTRAVENOUS
  Filled 2018-07-23: qty 1
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
## 2018-07-23 NOTE — ED Notes (Signed)
Pt given sandwich and drink.

## 2018-07-23 NOTE — ED Notes (Signed)
Discharge instructions discussed with Pt. Pt verbalized understanding. Pt stable and leaving via [facility] once they arrive.
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
## 2018-07-24 DIAGNOSIS — T82898A Other specified complication of vascular prosthetic devices, implants and grafts, initial encounter: Secondary | ICD-10-CM | POA: Diagnosis not present

## 2018-07-24 MED ORDER — ONDANSETRON HCL 4 MG PO TABS: 4.0000 mg | ORAL_TABLET | Freq: Four times a day (QID) | ORAL | 0 refills | Status: DC

## 2018-07-24 MED ORDER — HYDROMORPHONE HCL 2 MG PO TABS
4.0000 mg | ORAL_TABLET | ORAL | Status: DC
  Administered 2018-07-24: 4 mg via ORAL
  Filled 2018-07-24: qty 2

## 2018-07-27 ENCOUNTER — Other Ambulatory Visit: Payer: Self-pay

## 2018-07-27 ENCOUNTER — Emergency Department (HOSPITAL_COMMUNITY): Payer: Medicare Other

## 2018-07-27 ENCOUNTER — Emergency Department (HOSPITAL_COMMUNITY)
Admission: EM | Admit: 2018-07-27 | Discharge: 2018-07-27 | Disposition: A | Discharge status: Home / Self Care | Payer: Medicare Other | Attending: Emergency Medicine | Admitting: Emergency Medicine

## 2018-07-27 DIAGNOSIS — T82898A Other specified complication of vascular prosthetic devices, implants and grafts, initial encounter: Secondary | ICD-10-CM | POA: Diagnosis present

## 2018-07-27 DIAGNOSIS — Y828 Other medical devices associated with adverse incidents: Secondary | ICD-10-CM | POA: Diagnosis not present

## 2018-07-27 DIAGNOSIS — Z79899 Other long term (current) drug therapy: Secondary | ICD-10-CM | POA: Diagnosis not present

## 2018-07-27 DIAGNOSIS — J45909 Unspecified asthma, uncomplicated: Secondary | ICD-10-CM | POA: Insufficient documentation

## 2018-07-27 MED ORDER — ONDANSETRON HCL 4 MG/2ML IJ SOLN
4.0000 mg | Freq: Once | INTRAMUSCULAR | Status: AC
  Administered 2018-07-27: 4 mg via INTRAVENOUS
  Filled 2018-07-27: qty 2

## 2018-07-27 MED ORDER — HYDROMORPHONE HCL 2 MG PO TABS
2.0000 mg | ORAL_TABLET | Freq: Once | ORAL | Status: AC
  Administered 2018-07-27: 2 mg via ORAL
  Filled 2018-07-27: qty 1

## 2018-07-27 NOTE — Care Management Note (Signed)
Case Management Note  CM noted pt's return during chart review.  CM contacted Pam with [facility] home infusion services [facility] to discuss ways to assist with pt's needs in the home.  She will follow for needs.  No further CM needs noted at this time.  Kiaria Quinnell, Lynnae SandhoffAngela N, RN 07/27/2018, 2:39 PM

## 2018-07-27 NOTE — Discharge Instructions (Signed)
Expect a call from the interventional radiology team tomorrow, they will try to fix the problem.
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
## 2018-07-27 NOTE — ED Triage Notes (Signed)
Per EMS: PICC line problems since Friday.  home health is unable to draw off blood, but has been able to administer medications.  Home health wants placement to be checked

## 2018-07-28 ENCOUNTER — Encounter (HOSPITAL_BASED_OUTPATIENT_CLINIC_OR_DEPARTMENT_OTHER): Payer: Medicare Other | Attending: Physician Assistant

## 2018-07-28 ENCOUNTER — Ambulatory Visit (HOSPITAL_COMMUNITY)
Admission: RE | Admit: 2018-07-28 | Discharge: 2018-07-28 | Disposition: A | Discharge status: Home / Self Care | Payer: Medicare Other | Source: Ambulatory Visit | Attending: Interventional Radiology | Admitting: Interventional Radiology

## 2018-07-28 ENCOUNTER — Other Ambulatory Visit (HOSPITAL_COMMUNITY): Payer: Self-pay | Admitting: Interventional Radiology

## 2018-07-28 DIAGNOSIS — Z452 Encounter for adjustment and management of vascular access device: Secondary | ICD-10-CM

## 2018-07-28 DIAGNOSIS — L89224 Pressure ulcer of left hip, stage 4: Secondary | ICD-10-CM | POA: Insufficient documentation

## 2018-07-28 DIAGNOSIS — L89152 Pressure ulcer of sacral region, stage 2: Secondary | ICD-10-CM | POA: Insufficient documentation

## 2018-07-28 DIAGNOSIS — L89324 Pressure ulcer of left buttock, stage 4: Secondary | ICD-10-CM | POA: Insufficient documentation

## 2018-07-28 DIAGNOSIS — M869 Osteomyelitis, unspecified: Secondary | ICD-10-CM | POA: Insufficient documentation

## 2018-07-28 DIAGNOSIS — L89314 Pressure ulcer of right buttock, stage 4: Secondary | ICD-10-CM | POA: Insufficient documentation

## 2018-07-28 DIAGNOSIS — G8250 Quadriplegia, unspecified: Secondary | ICD-10-CM | POA: Insufficient documentation

## 2018-07-28 DIAGNOSIS — M8618 Other acute osteomyelitis, other site: Secondary | ICD-10-CM | POA: Insufficient documentation

## 2018-07-28 DIAGNOSIS — I878 Other specified disorders of veins: Secondary | ICD-10-CM | POA: Insufficient documentation

## 2018-07-28 DIAGNOSIS — X58XXXA Exposure to other specified factors, initial encounter: Secondary | ICD-10-CM | POA: Insufficient documentation

## 2018-07-28 DIAGNOSIS — S3140XA Unspecified open wound of vagina and vulva, initial encounter: Secondary | ICD-10-CM | POA: Insufficient documentation

## 2018-07-28 MED ORDER — HEPARIN SOD (PORK) LOCK FLUSH 100 UNIT/ML IV SOLN
INTRAVENOUS | Status: AC
  Filled 2018-07-28: qty 5

## 2018-07-28 MED ORDER — LIDOCAINE HCL 1 % IJ SOLN
INTRAMUSCULAR | Status: AC
  Filled 2018-07-28: qty 20

## 2018-07-28 MED ORDER — LIDOCAINE HCL (PF) 1 % IJ SOLN
INTRAMUSCULAR | Status: DC | PRN
  Administered 2018-07-28: 5 mL

## 2018-07-28 NOTE — Procedures (Signed)
Patient needs IV antibiotics and recently placed left arm midline catheter is no longer aspirating.  Therefore, a right arm PICC was attempted again and fortunately we were successful at placing a right brachial dual lumen PICC.  Tip at SVC/RA junction.  Will removed left arm catheter.  PICC is ready to use.  Minimal blood loss and no immediate complication.

## 2018-08-04 ENCOUNTER — Other Ambulatory Visit: Payer: Self-pay

## 2018-08-04 ENCOUNTER — Ambulatory Visit (INDEPENDENT_AMBULATORY_CARE_PROVIDER_SITE_OTHER): Payer: Medicare Other | Admitting: Family

## 2018-08-04 ENCOUNTER — Encounter: Payer: Self-pay | Admitting: Family

## 2018-08-04 VITALS — BP 100/64 | HR 82 | Temp 98.6°F

## 2018-08-04 DIAGNOSIS — M86352 Chronic multifocal osteomyelitis, left femur: Secondary | ICD-10-CM | POA: Diagnosis not present
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
## 2018-08-04 NOTE — Patient Instructions (Signed)
Nice to see you.  Please continue to take your medications.  Continue to do a great job with your nutrition and hydration.  Plan to follow up in 3-4 weeks near the completion of therapy.

## 2018-08-05 ENCOUNTER — Encounter: Payer: Self-pay | Admitting: Family

## 2018-08-05 DIAGNOSIS — L89324 Pressure ulcer of left buttock, stage 4: Secondary | ICD-10-CM | POA: Diagnosis not present

## 2018-08-05 DIAGNOSIS — M8618 Other acute osteomyelitis, other site: Secondary | ICD-10-CM | POA: Diagnosis not present

## 2018-08-05 DIAGNOSIS — G8250 Quadriplegia, unspecified: Secondary | ICD-10-CM | POA: Diagnosis not present

## 2018-08-05 DIAGNOSIS — L89314 Pressure ulcer of right buttock, stage 4: Secondary | ICD-10-CM | POA: Diagnosis not present

## 2018-08-05 DIAGNOSIS — L89152 Pressure ulcer of sacral region, stage 2: Secondary | ICD-10-CM | POA: Diagnosis not present

## 2018-08-05 DIAGNOSIS — S3140XA Unspecified open wound of vagina and vulva, initial encounter: Secondary | ICD-10-CM | POA: Diagnosis not present

## 2018-08-05 DIAGNOSIS — L89224 Pressure ulcer of left hip, stage 4: Secondary | ICD-10-CM | POA: Diagnosis not present

## 2018-08-05 DIAGNOSIS — X58XXXA Exposure to other specified factors, initial encounter: Secondary | ICD-10-CM | POA: Diagnosis not present

## 2018-08-05 NOTE — Assessment & Plan Note (Signed)
[name] has osteomyelitis of the left greater trochanter and continues to be treated for culture negative osteomyelitis with vancomycin and ceftriaxone. She is adherent and tolerating the medication with no adverse side effects. Her new PICC line is clean, dry and intact. Continue to monitor inflammatory markers through Home Health blood work. Continue current dose of vancomycin and ceftriaxone with end date of 08/24/18. Plan for follow up in 3-4 weeks.

## 2018-08-11 ENCOUNTER — Telehealth: Payer: Self-pay | Admitting: Behavioral Health

## 2018-08-11 NOTE — Telephone Encounter (Signed)
Short Stay orders faxed to short stay and faxed back to [facility].  Called Mary at [facility]  and informed her that short stay orders for Cath flow were faxed to short stay.  Mary verbalized understanding. Angeline SlimAshley Jagger Demonte RN

## 2018-08-12 ENCOUNTER — Other Ambulatory Visit (HOSPITAL_COMMUNITY): Payer: Self-pay

## 2018-08-13 ENCOUNTER — Ambulatory Visit (HOSPITAL_COMMUNITY)
Admission: RE | Admit: 2018-08-13 | Discharge: 2018-08-13 | Disposition: A | Discharge status: Home / Self Care | Payer: Medicare Other | Source: Ambulatory Visit | Attending: Internal Medicine | Admitting: Internal Medicine

## 2018-08-13 DIAGNOSIS — N390 Urinary tract infection, site not specified: Secondary | ICD-10-CM | POA: Insufficient documentation

## 2018-08-13 DIAGNOSIS — G8250 Quadriplegia, unspecified: Secondary | ICD-10-CM | POA: Insufficient documentation

## 2018-08-13 DIAGNOSIS — Z993 Dependence on wheelchair: Secondary | ICD-10-CM | POA: Diagnosis not present

## 2018-08-13 LAB — BASIC METABOLIC PANEL
Anion gap: 5 (ref 5–15)
BUN: 7 mg/dL (ref 6–20)
CHLORIDE: 106 mmol/L (ref 98–111)
CO2: 28 mmol/L (ref 22–32)
Calcium: 8.4 mg/dL — ABNORMAL LOW (ref 8.9–10.3)
Creatinine, Ser: 0.35 mg/dL — ABNORMAL LOW (ref 0.44–1.00)
GFR calc Af Amer: >60 mL/min (ref >60)
GFR calc non Af Amer: >60 mL/min (ref >60)
GLUCOSE: 86 mg/dL (ref 70–99)
POTASSIUM: 3.9 mmol/L (ref 3.5–5.1)
Sodium: 139 mmol/L (ref 135–145)

## 2018-08-13 LAB — CBC WITH DIFFERENTIAL/PLATELET
BASOS PCT: 1 %
Basophils Absolute: 0.1 10*3/uL (ref 0.0–0.1)
Eosinophils Absolute: 0.1 10*3/uL (ref 0.0–0.7)
Eosinophils Relative: 2 %
HEMATOCRIT: 36 % (ref 36.0–46.0)
Hemoglobin: 11.1 g/dL — ABNORMAL LOW (ref 12.0–15.0)
LYMPHS PCT: 23 %
Lymphs Abs: 1.3 10*3/uL (ref 0.7–4.0)
MCH: 26.6 pg (ref 26.0–34.0)
MCHC: 30.8 g/dL (ref 30.0–36.0)
MCV: 86.1 fL (ref 78.0–100.0)
MONOS PCT: 6 %
Monocytes Absolute: 0.3 10*3/uL (ref 0.1–1.0)
NEUTROS ABS: 3.8 10*3/uL (ref 1.7–7.7)
Neutrophils Relative %: 68 %
Platelets: 162 10*3/uL (ref 150–400)
RBC: 4.18 MIL/uL (ref 3.87–5.11)
RDW: 22.2 % — ABNORMAL HIGH (ref 11.5–15.5)
WBC: 5.6 10*3/uL (ref 4.0–10.5)

## 2018-08-13 LAB — SEDIMENTATION RATE: SED RATE: 29 mm/h — AB (ref 0–22)

## 2018-08-13 LAB — C-REACTIVE PROTEIN: CRP: 1.9 mg/dL — ABNORMAL HIGH (ref <1.0)

## 2018-08-13 LAB — VANCOMYCIN, TROUGH: Vancomycin Tr: 14 ug/mL — ABNORMAL LOW (ref 15–20)

## 2018-08-13 MED ORDER — ALTEPLASE 2 MG IJ SOLR
INTRAMUSCULAR | Status: AC
  Filled 2018-08-13: qty 4

## 2018-08-13 MED ORDER — ALTEPLASE 2 MG IJ SOLR
2.0000 mg | Freq: Once | INTRAMUSCULAR | Status: AC
  Administered 2018-08-13: 2 mg

## 2018-08-25 ENCOUNTER — Ambulatory Visit (INDEPENDENT_AMBULATORY_CARE_PROVIDER_SITE_OTHER): Payer: Medicare Other | Admitting: Family

## 2018-08-25 ENCOUNTER — Telehealth: Payer: Self-pay

## 2018-08-25 ENCOUNTER — Encounter: Payer: Self-pay | Admitting: Family

## 2018-08-25 VITALS — BP 103/69 | HR 75 | Temp 98.8°F | Ht <= 58 in | Wt 90.0 lb

## 2018-08-25 DIAGNOSIS — M86352 Chronic multifocal osteomyelitis, left femur: Secondary | ICD-10-CM | POA: Diagnosis not present

## 2018-08-25 MED ORDER — CEFPODOXIME PROXETIL 200 MG PO TABS: 400.0000 mg | ORAL_TABLET | Freq: Two times a day (BID) | ORAL | 0 refills | Status: DC

## 2018-08-25 MED ORDER — DOXYCYCLINE HYCLATE 100 MG PO TABS: 100.0000 mg | ORAL_TABLET | Freq: Two times a day (BID) | ORAL | 0 refills | Status: DC
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
## 2018-08-25 NOTE — Telephone Encounter (Signed)
Called [facility] and verbal order given per Marcos Eke NP to [facility] pharmacy tech to pull picc line today 08/25/18. Verbal order read back and understood.  Patient made aware at office visit. Karlisha Mathena S. LPN

## 2018-08-25 NOTE — Patient Instructions (Signed)
Nice to see you!  We will get your PICC line taken out and start you on oral antibiotics for 2 weeks.  Plan to follow up in 1 month or sooner if needed.

## 2018-08-26 ENCOUNTER — Encounter: Payer: Self-pay | Admitting: Family

## 2018-08-26 NOTE — Assessment & Plan Note (Signed)
Ms. Gines appears to be doing well clinically with continued wound healing and no further evidence of infection. Her inflammatory markers are slightly elevated compared to previous. She has completed 5 weeks of therapy to date. I think given her improvements it is reasonable to transition her to oral medications for 2 additional weeks. Will start Cefpodoxime and doxycycline for broad spectrum coverage. Plan to follow up in 4 weeks or sooner if needed. Encouraged to continue offloading her wounds and working on nutritional intake. She will follow up with wound care as well. She does remain high risk for future skin breakdown, however I think she has made significant progress.

## 2018-09-01 ENCOUNTER — Encounter (HOSPITAL_BASED_OUTPATIENT_CLINIC_OR_DEPARTMENT_OTHER): Payer: Medicare Other | Attending: Physician Assistant

## 2018-09-01 DIAGNOSIS — L89324 Pressure ulcer of left buttock, stage 4: Secondary | ICD-10-CM | POA: Diagnosis not present

## 2018-09-01 DIAGNOSIS — L89224 Pressure ulcer of left hip, stage 4: Secondary | ICD-10-CM | POA: Insufficient documentation

## 2018-09-01 DIAGNOSIS — L89152 Pressure ulcer of sacral region, stage 2: Secondary | ICD-10-CM | POA: Diagnosis not present

## 2018-09-01 DIAGNOSIS — G8250 Quadriplegia, unspecified: Secondary | ICD-10-CM | POA: Diagnosis not present

## 2018-09-01 DIAGNOSIS — L89314 Pressure ulcer of right buttock, stage 4: Secondary | ICD-10-CM | POA: Diagnosis not present

## 2018-09-22 ENCOUNTER — Encounter (HOSPITAL_BASED_OUTPATIENT_CLINIC_OR_DEPARTMENT_OTHER): Payer: Medicare Other | Attending: Physician Assistant

## 2018-09-23 ENCOUNTER — Ambulatory Visit (INDEPENDENT_AMBULATORY_CARE_PROVIDER_SITE_OTHER): Payer: Medicare Other | Admitting: Family

## 2018-09-23 ENCOUNTER — Encounter: Payer: Self-pay | Admitting: Family

## 2018-09-23 VITALS — BP 89/55 | HR 84 | Temp 98.6°F

## 2018-09-23 DIAGNOSIS — M86352 Chronic multifocal osteomyelitis, left femur: Secondary | ICD-10-CM
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
## 2018-09-23 NOTE — Assessment & Plan Note (Signed)
[name] has completed treatment for culture negative osteomyelitis of the left femur. Wounds continue to heal and she is working with wound care. Discussed options for paying for wound care supplies. It appears no further antibiotics are needed at this time. Continue with wound care per providers. Encouraged to continue with nutrition and off loading. Follow up with ID as needed.

## 2018-09-23 NOTE — Patient Instructions (Addendum)
Nice to see you.  We will continue to monitor you off antibiotics.   Continue to follow up with wound care.  Continue with off loading and working on nutrition and physical activity.  The new chair should help.   We are happy to see you back as needed.

## 2018-10-13 ENCOUNTER — Telehealth: Payer: Self-pay | Admitting: Neurology

## 2018-10-13 NOTE — Telephone Encounter (Signed)
Pt call stating she is needing a referral sent to the out pt rehab here in our build due to finishing PT with [facility]. Pts PCP will be out of town for the next month and would like Dr. Anne Hahn to send the referral

## 2018-10-14 NOTE — Telephone Encounter (Signed)
Spoke with [place]. She was last seen 05/24/18.  Sts.her pcp at  [facility] ordered PT with [facility]; sts. she completed this PT and now needs PT at [facility]. Sts. her pcp [place], so she wanted to see if Dr. Anne Hahn would order this.  I have explained that her pcp should have a physician covering for her during the time she is out of the office, and that physician would be able to send in orders.  Dr. Anne Hahn would need to see her back in the office before he could order PT, b/c this requires an ov w/i 30 days of the order. She verbalized understanding of same/fim

## 2018-10-26 ENCOUNTER — Emergency Department (HOSPITAL_COMMUNITY)
Admission: EM | Admit: 2018-10-26 | Discharge: 2018-10-26 | Disposition: A | Discharge status: Home / Self Care | Payer: Medicare Other | Attending: Emergency Medicine | Admitting: Emergency Medicine

## 2018-10-26 ENCOUNTER — Encounter (HOSPITAL_COMMUNITY): Payer: Self-pay

## 2018-10-26 DIAGNOSIS — J45909 Unspecified asthma, uncomplicated: Secondary | ICD-10-CM | POA: Insufficient documentation

## 2018-10-26 DIAGNOSIS — Z85828 Personal history of other malignant neoplasm of skin: Secondary | ICD-10-CM | POA: Diagnosis not present

## 2018-10-26 DIAGNOSIS — Z79899 Other long term (current) drug therapy: Secondary | ICD-10-CM | POA: Insufficient documentation

## 2018-10-26 DIAGNOSIS — I872 Venous insufficiency (chronic) (peripheral): Secondary | ICD-10-CM | POA: Diagnosis not present

## 2018-10-26 DIAGNOSIS — I878 Other specified disorders of veins: Secondary | ICD-10-CM

## 2018-10-26 DIAGNOSIS — G8250 Quadriplegia, unspecified: Secondary | ICD-10-CM | POA: Insufficient documentation

## 2018-10-26 DIAGNOSIS — R2243 Localized swelling, mass and lump, lower limb, bilateral: Secondary | ICD-10-CM | POA: Diagnosis present

## 2018-10-26 MED ORDER — POTASSIUM CHLORIDE ER 10 MEQ PO TBCR: 10.0000 meq | EXTENDED_RELEASE_TABLET | Freq: Every day | ORAL | 0 refills | Status: DC

## 2018-10-26 MED ORDER — FUROSEMIDE 20 MG PO TABS: 20.0000 mg | ORAL_TABLET | Freq: Two times a day (BID) | ORAL | 0 refills | Status: DC

## 2018-10-26 NOTE — Discharge Instructions (Signed)
You are seen in the emergency department today for swelling to the lower legs.  We suspect this is from venous stasis otherwise blood mainly in the lower legs.  We recommend to keep your legs elevated as much as as possible.  Please utilize compression stockings.  You are sending you home on Lasix 20 mg twice per day as well as a potassium supplement to help with fluid.  Please take this twice per day for the next 5 days.  We have prescribed you new medication(s) today. Discuss the medications prescribed today with your pharmacist as they can have adverse effects and interactions with your other medicines including over the counter and prescribed medications. Seek medical evaluation if you start to experience new or abnormal symptoms after taking one of these medicines, seek care immediately if you start to experience difficulty breathing, feeling of your throat closing, facial swelling, or rash as these could be indications of a more serious allergic reaction  We would like you to follow-up closely with your primary care provider within 3 to 5 days for reevaluation.  Please also follow-up with the wound clinic tomorrow as scheduled.  Return to the ER for new or worsening symptoms or any other concerns.
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
## 2018-10-26 NOTE — ED Notes (Signed)
Got patient undress on the monitor patient is resting with nurse at bedside and call bell in reach 

## 2018-10-26 NOTE — ED Notes (Signed)
Pt requesting pain shot.  PA made aware.

## 2018-10-26 NOTE — ED Triage Notes (Signed)
To room via EMS.  Pt here for bilateral pedal edema x 1 month.  Pt tried a ? Fluid pill but no decrease edema.  Pt has several wounds that she sees wound care every 2 weeks.   EMS BP 114/62 HR 84 SpO2 99%  RR 16

## 2018-10-27 ENCOUNTER — Other Ambulatory Visit: Payer: Self-pay | Admitting: Neurology

## 2018-10-27 ENCOUNTER — Encounter (HOSPITAL_BASED_OUTPATIENT_CLINIC_OR_DEPARTMENT_OTHER): Payer: Medicare Other | Attending: Physician Assistant

## 2018-10-27 ENCOUNTER — Encounter (HOSPITAL_BASED_OUTPATIENT_CLINIC_OR_DEPARTMENT_OTHER): Payer: Self-pay

## 2018-10-27 DIAGNOSIS — G8250 Quadriplegia, unspecified: Secondary | ICD-10-CM | POA: Diagnosis not present

## 2018-10-27 DIAGNOSIS — L89323 Pressure ulcer of left buttock, stage 3: Secondary | ICD-10-CM | POA: Diagnosis not present

## 2018-10-27 DIAGNOSIS — L89314 Pressure ulcer of right buttock, stage 4: Secondary | ICD-10-CM | POA: Diagnosis not present

## 2018-10-27 DIAGNOSIS — L89224 Pressure ulcer of left hip, stage 4: Secondary | ICD-10-CM | POA: Diagnosis present

## 2018-11-01 ENCOUNTER — Telehealth: Payer: Self-pay | Admitting: Neurology

## 2018-11-01 MED ORDER — DIAZEPAM 5 MG PO TABS: 5.0000 mg | ORAL_TABLET | Freq: Three times a day (TID) | ORAL | 3 refills | Status: DC

## 2018-11-01 NOTE — Telephone Encounter (Signed)
Pt requesting a call to discuss dosing for medication diazepam (VALIUM) 5 MG tablet please call to advise

## 2018-11-01 NOTE — Telephone Encounter (Signed)
Spoke with [employer]. She sts. she has increased her Diazepam, as discussed during last ov, to 5mg  tid.  Will ask [facility] to send new rx. in, and will cancel rx. that is at [facility] now, for 1/2 tab tid/fim

## 2018-11-01 NOTE — Telephone Encounter (Signed)
There were concerns about oversedation in her July 2019 admission.  They had cut her diazepam back to one half of the 5 mg tablet 3 times daily.  Looking at the notes, registry, the patient has continued to get 90 of the 5 mg tablets a month indicating that she is on a full tablet 3 times a day.  She remains on Dilaudid 4 mg, she gets 120 tablets a month, the last refill was on 30 October 2018.  She also remains on Lyrica.  I called the patient, unable to reach her cell phone, I called her caretaker, she is not sure what the patient is on currently but I believe that she is on 5 mg 3 times daily.

## 2018-11-24 ENCOUNTER — Encounter (HOSPITAL_BASED_OUTPATIENT_CLINIC_OR_DEPARTMENT_OTHER): Payer: Medicare Other | Attending: Physician Assistant

## 2018-11-24 DIAGNOSIS — L89224 Pressure ulcer of left hip, stage 4: Secondary | ICD-10-CM | POA: Insufficient documentation

## 2018-11-24 DIAGNOSIS — L89324 Pressure ulcer of left buttock, stage 4: Secondary | ICD-10-CM | POA: Diagnosis not present

## 2018-11-24 DIAGNOSIS — L89314 Pressure ulcer of right buttock, stage 4: Secondary | ICD-10-CM | POA: Insufficient documentation

## 2018-11-29 ENCOUNTER — Encounter: Payer: Self-pay | Admitting: Neurology

## 2018-11-29 ENCOUNTER — Ambulatory Visit (INDEPENDENT_AMBULATORY_CARE_PROVIDER_SITE_OTHER): Payer: Medicare Other | Admitting: Neurology

## 2018-11-29 VITALS — BP 76/38 | HR 76

## 2018-11-29 DIAGNOSIS — S14109S Unspecified injury at unspecified level of cervical spinal cord, sequela: Secondary | ICD-10-CM | POA: Diagnosis not present

## 2018-11-29 DIAGNOSIS — M5442 Lumbago with sciatica, left side: Secondary | ICD-10-CM | POA: Diagnosis not present

## 2018-11-29 DIAGNOSIS — G8250 Quadriplegia, unspecified: Secondary | ICD-10-CM

## 2018-11-29 MED ORDER — PREGABALIN 75 MG PO CAPS: 75.0000 mg | ORAL_CAPSULE | Freq: Three times a day (TID) | ORAL | 1 refills | Status: DC

## 2018-11-29 MED ORDER — PREDNISONE 5 MG PO TABS: ORAL_TABLET | ORAL | 0 refills | Status: DC
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
## 2018-12-02 ENCOUNTER — Ambulatory Visit
Admission: RE | Admit: 2018-12-02 | Discharge: 2018-12-02 | Disposition: A | Discharge status: Home / Self Care | Payer: Medicare Other | Source: Ambulatory Visit | Attending: Neurology | Admitting: Neurology

## 2018-12-02 ENCOUNTER — Telehealth: Payer: Self-pay | Admitting: Neurology

## 2018-12-02 DIAGNOSIS — M5442 Lumbago with sciatica, left side: Secondary | ICD-10-CM

## 2018-12-02 DIAGNOSIS — S14109S Unspecified injury at unspecified level of cervical spinal cord, sequela: Secondary | ICD-10-CM

## 2018-12-02 NOTE — Telephone Encounter (Signed)
I called the patient.  X-rays of the lumbar and cervical spine show stable fusion in the neck, Harrington rods have not changed in position, no fractures.   XR lumbar 12/02/18:  IMPRESSION: 1. No apparent change in position of Harrington rods for fixation of the thoracic scoliosis. 2. No active lung disease. 3. Large amount of feces throughout the colon.   XR cervical 12/02/18:  IMPRESSION: No apparent change in anterior fusion at C4-5 and posterior fusion from C3-C5.

## 2018-12-06 MED ORDER — PREGABALIN 75 MG PO CAPS: ORAL_CAPSULE | ORAL | 1 refills | Status: DC

## 2018-12-06 NOTE — Telephone Encounter (Signed)
I called the patient.  The patient is still having some back pain, she is coming off the 6-day prednisone Dosepak.  In the past, she has gotten epidural injections for the low back pain which have been helpful, but she has decubitus ulcers currently.  We will go up on the Lyrica taking 75 mg twice during the day and 2 at night, she will call for any dose adjustments.

## 2018-12-06 NOTE — Telephone Encounter (Signed)
Patient is returning a call regarding x-ray results.

## 2018-12-06 NOTE — Addendum Note (Signed)
Addended by: York SpanielWILLIS, Emilyn Ruble K on: 12/06/2018 02:45 PM   Modules accepted: Orders

## 2018-12-06 NOTE — Telephone Encounter (Signed)
I contacted the patient. I advised results of X-ray. Patient states her lower back pain is still severe.  Patient wanted to know if this could be related to her scoliosis/siatica worsening? She also wanted to let the MD know she will complete her Prednisone dosage pack tomorrow. She states the combination of Lyrica 75 mg tid, Dilaudid 4 mg every 6 hrs prn and the Prednisone dosage pack has helped some but has not provided complete relief. Patient wanted to know what she should do from here to control her lower back pain?

## 2018-12-08 DIAGNOSIS — L89314 Pressure ulcer of right buttock, stage 4: Secondary | ICD-10-CM | POA: Diagnosis not present

## 2018-12-29 ENCOUNTER — Encounter (HOSPITAL_BASED_OUTPATIENT_CLINIC_OR_DEPARTMENT_OTHER): Payer: Medicare Other | Attending: Physician Assistant

## 2018-12-29 DIAGNOSIS — L89324 Pressure ulcer of left buttock, stage 4: Secondary | ICD-10-CM | POA: Insufficient documentation

## 2018-12-29 DIAGNOSIS — L89314 Pressure ulcer of right buttock, stage 4: Secondary | ICD-10-CM | POA: Diagnosis not present

## 2018-12-29 DIAGNOSIS — S14109S Unspecified injury at unspecified level of cervical spinal cord, sequela: Secondary | ICD-10-CM | POA: Diagnosis not present

## 2018-12-29 DIAGNOSIS — G8222 Paraplegia, incomplete: Secondary | ICD-10-CM | POA: Diagnosis not present

## 2018-12-29 DIAGNOSIS — L89224 Pressure ulcer of left hip, stage 4: Secondary | ICD-10-CM | POA: Diagnosis not present

## 2018-12-30 ENCOUNTER — Ambulatory Visit: Payer: Medicare Other | Attending: Family Medicine | Admitting: Occupational Therapy

## 2018-12-30 ENCOUNTER — Ambulatory Visit: Payer: Medicare Other | Admitting: Rehabilitative and Restorative Service Providers"

## 2018-12-30 DIAGNOSIS — M6281 Muscle weakness (generalized): Secondary | ICD-10-CM

## 2018-12-30 DIAGNOSIS — R29818 Other symptoms and signs involving the nervous system: Secondary | ICD-10-CM | POA: Insufficient documentation

## 2018-12-30 DIAGNOSIS — G8254 Quadriplegia, C5-C7 incomplete: Secondary | ICD-10-CM

## 2018-12-30 DIAGNOSIS — R293 Abnormal posture: Secondary | ICD-10-CM | POA: Diagnosis present

## 2018-12-30 DIAGNOSIS — R278 Other lack of coordination: Secondary | ICD-10-CM | POA: Insufficient documentation

## 2018-12-30 DIAGNOSIS — R29898 Other symptoms and signs involving the musculoskeletal system: Secondary | ICD-10-CM | POA: Diagnosis present
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
## 2019-01-05 ENCOUNTER — Ambulatory Visit: Payer: Medicare Other | Admitting: Physical Therapy

## 2019-01-07 ENCOUNTER — Telehealth: Payer: Self-pay | Admitting: Rehabilitative and Restorative Service Providers"

## 2019-01-07 ENCOUNTER — Encounter

## 2019-01-07 ENCOUNTER — Ambulatory Visit: Payer: Medicare Other | Admitting: Physical Therapy

## 2019-01-07 NOTE — Telephone Encounter (Signed)
Called patient due to a no show today.  She had attempted to call the front office and could not get through.  She notes she is still out of town for a family emergency (she had cancelled Wednesday's appt).  She plans to attend the next scheduled appt.  Lyndie Vanderloop, PT

## 2019-01-14 ENCOUNTER — Encounter

## 2019-01-18 ENCOUNTER — Ambulatory Visit: Payer: Medicare Other | Admitting: Occupational Therapy

## 2019-01-19 ENCOUNTER — Ambulatory Visit: Payer: Medicare Other | Admitting: Physical Therapy

## 2019-01-20 ENCOUNTER — Ambulatory Visit: Payer: Medicare Other | Admitting: Occupational Therapy

## 2019-01-25 ENCOUNTER — Emergency Department (HOSPITAL_COMMUNITY): Payer: Medicare Other

## 2019-01-25 ENCOUNTER — Other Ambulatory Visit: Payer: Self-pay

## 2019-01-25 ENCOUNTER — Encounter (HOSPITAL_COMMUNITY): Payer: Self-pay | Admitting: Obstetrics and Gynecology

## 2019-01-25 ENCOUNTER — Emergency Department (HOSPITAL_COMMUNITY)
Admission: EM | Admit: 2019-01-25 | Discharge: 2019-01-25 | Disposition: A | Discharge status: Home / Self Care | Payer: Medicare Other | Attending: Emergency Medicine | Admitting: Emergency Medicine

## 2019-01-25 DIAGNOSIS — Z79899 Other long term (current) drug therapy: Secondary | ICD-10-CM | POA: Insufficient documentation

## 2019-01-25 DIAGNOSIS — N3001 Acute cystitis with hematuria: Secondary | ICD-10-CM | POA: Diagnosis not present

## 2019-01-25 DIAGNOSIS — Q6601 Congenital talipes equinovarus, right foot: Secondary | ICD-10-CM | POA: Insufficient documentation

## 2019-01-25 DIAGNOSIS — Q6602 Congenital talipes equinovarus, left foot: Secondary | ICD-10-CM | POA: Diagnosis not present

## 2019-01-25 DIAGNOSIS — R109 Unspecified abdominal pain: Secondary | ICD-10-CM | POA: Diagnosis present

## 2019-01-25 DIAGNOSIS — Q600 Renal agenesis, unilateral: Secondary | ICD-10-CM | POA: Insufficient documentation

## 2019-01-25 DIAGNOSIS — Z85820 Personal history of malignant melanoma of skin: Secondary | ICD-10-CM | POA: Diagnosis not present

## 2019-01-25 DIAGNOSIS — Q631 Lobulated, fused and horseshoe kidney: Secondary | ICD-10-CM | POA: Insufficient documentation

## 2019-01-25 LAB — CBC WITH DIFFERENTIAL/PLATELET
ABS IMMATURE GRANULOCYTES: 0.02 10*3/uL (ref 0.00–0.07)
BASOS PCT: 1 %
Basophils Absolute: 0.1 10*3/uL (ref 0.0–0.1)
EOS ABS: 0.2 10*3/uL (ref 0.0–0.5)
Eosinophils Relative: 4 %
HCT: 38.4 % (ref 36.0–46.0)
Hemoglobin: 11.7 g/dL — ABNORMAL LOW (ref 12.0–15.0)
IMMATURE GRANULOCYTES: 0 %
Lymphocytes Relative: 27 %
Lymphs Abs: 1.8 10*3/uL (ref 0.7–4.0)
MCH: 27 pg (ref 26.0–34.0)
MCHC: 30.5 g/dL (ref 30.0–36.0)
MCV: 88.5 fL (ref 80.0–100.0)
MONOS PCT: 8 %
Monocytes Absolute: 0.5 10*3/uL (ref 0.1–1.0)
NEUTROS ABS: 4.1 10*3/uL (ref 1.7–7.7)
NEUTROS PCT: 60 %
NRBC: 0 % (ref 0.0–0.2)
PLATELETS: 165 10*3/uL (ref 150–400)
RBC: 4.34 MIL/uL (ref 3.87–5.11)
RDW: 16.6 % — AB (ref 11.5–15.5)
WBC: 6.7 10*3/uL (ref 4.0–10.5)

## 2019-01-25 LAB — COMPREHENSIVE METABOLIC PANEL
ALT: 8 U/L (ref 0–44)
AST: 12 U/L — AB (ref 15–41)
Albumin: 3.5 g/dL (ref 3.5–5.0)
Alkaline Phosphatase: 59 U/L (ref 38–126)
Anion gap: 7 (ref 5–15)
BUN: 16 mg/dL (ref 6–20)
CHLORIDE: 104 mmol/L (ref 98–111)
CO2: 24 mmol/L (ref 22–32)
Calcium: 8.3 mg/dL — ABNORMAL LOW (ref 8.9–10.3)
Creatinine, Ser: 0.55 mg/dL (ref 0.44–1.00)
Glucose, Bld: 87 mg/dL (ref 70–99)
POTASSIUM: 4 mmol/L (ref 3.5–5.1)
SODIUM: 135 mmol/L (ref 135–145)
Total Bilirubin: 0.5 mg/dL (ref 0.3–1.2)
Total Protein: 7.3 g/dL (ref 6.5–8.1)

## 2019-01-25 LAB — URINALYSIS, ROUTINE W REFLEX MICROSCOPIC
Bilirubin Urine: NEGATIVE
Glucose, UA: NEGATIVE mg/dL
KETONES UR: NEGATIVE mg/dL
Nitrite: POSITIVE — AB
PROTEIN: 100 mg/dL — AB
Specific Gravity, Urine: 1.017 (ref 1.005–1.030)
pH: 6 (ref 5.0–8.0)

## 2019-01-25 LAB — LIPASE, BLOOD: LIPASE: 28 U/L (ref 11–51)

## 2019-01-25 LAB — PREGNANCY, URINE: PREG TEST UR: NEGATIVE

## 2019-01-25 MED ORDER — FENTANYL CITRATE (PF) 100 MCG/2ML IJ SOLN
25.0000 ug | Freq: Once | INTRAMUSCULAR | Status: AC
  Administered 2019-01-25: 25 ug via INTRAVENOUS
  Filled 2019-01-25: qty 2

## 2019-01-25 MED ORDER — CIPROFLOXACIN IN D5W 400 MG/200ML IV SOLN
400.0000 mg | Freq: Once | INTRAVENOUS | Status: AC
  Administered 2019-01-25: 400 mg via INTRAVENOUS
  Filled 2019-01-25: qty 200

## 2019-01-25 MED ORDER — IOPAMIDOL (ISOVUE-300) INJECTION 61%
100.0000 mL | Freq: Once | INTRAVENOUS | Status: AC | PRN
  Administered 2019-01-25: 80 mL via INTRAVENOUS

## 2019-01-25 MED ORDER — ONDANSETRON HCL 4 MG PO TABS: 4.0000 mg | ORAL_TABLET | Freq: Three times a day (TID) | ORAL | 0 refills | Status: DC | PRN

## 2019-01-25 MED ORDER — HYDROMORPHONE HCL 2 MG PO TABS
1.0000 mg | ORAL_TABLET | Freq: Once | ORAL | Status: AC
  Administered 2019-01-25: 1 mg via ORAL
  Filled 2019-01-25: qty 1

## 2019-01-25 MED ORDER — ONDANSETRON HCL 4 MG/2ML IJ SOLN
4.0000 mg | Freq: Once | INTRAMUSCULAR | Status: AC
  Administered 2019-01-25: 4 mg via INTRAVENOUS
  Filled 2019-01-25: qty 2

## 2019-01-25 MED ORDER — PROMETHAZINE HCL 25 MG/ML IJ SOLN
12.5000 mg | Freq: Once | INTRAMUSCULAR | Status: AC
  Administered 2019-01-25: 12.5 mg via INTRAVENOUS
  Filled 2019-01-25: qty 1

## 2019-01-25 MED ORDER — ACETAMINOPHEN 325 MG PO TABS
650.0000 mg | ORAL_TABLET | Freq: Once | ORAL | Status: DC
  Filled 2019-01-25: qty 2

## 2019-01-25 MED ORDER — FAMOTIDINE IN NACL 20-0.9 MG/50ML-% IV SOLN
20.0000 mg | Freq: Once | INTRAVENOUS | Status: AC
  Administered 2019-01-25: 20 mg via INTRAVENOUS
  Filled 2019-01-25: qty 50

## 2019-01-25 MED ORDER — CIPROFLOXACIN HCL 500 MG PO TABS: 500.0000 mg | ORAL_TABLET | Freq: Two times a day (BID) | ORAL | 0 refills | Status: AC
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
## 2019-01-25 NOTE — Discharge Instructions (Addendum)
You were given a prescription for antibiotics. Please take the antibiotic prescription fully.  You are given a prescription for nausea medications if you continue to have nausea.  A culture was sent of your urine today to determine if there is any bacterial growth. If the results of the culture are positive and you require an antibiotic or a change of your prescribed antibiotic you will be contacted by the hospital. If the results are negative you will not be contacted.  These make a follow-up appointment with your doctor within the next 2 to 3 days for reevaluation.   Please return to the ER sooner if you have any new or worsening symptoms, or if you have any of the following symptoms:  Abdominal pain that does not go away.  You have a fever.  You keep throwing up (vomiting).  The pain is felt only in portions of the abdomen. Pain in the right side could possibly be appendicitis. In an adult, pain in the left lower portion of the abdomen could be colitis or diverticulitis.  You pass bloody or black tarry stools.  There is bright red blood in the stool.  The constipation stays for more than 4 days.  There is belly (abdominal) or rectal pain.  You do not seem to be getting better.  You have any questions or concerns.

## 2019-01-25 NOTE — ED Notes (Signed)
[facility] called for transport.  
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
## 2019-01-25 NOTE — ED Triage Notes (Signed)
Per EMS: Pt is coming from home. Pt has a suprapubic catheter and is concerned she has a UTI as the urine is darker. Pt reports flank pain x3 days.

## 2019-01-25 NOTE — ED Notes (Signed)
[facility] here to transport pt back to her home/residence. 

## 2019-01-26 ENCOUNTER — Ambulatory Visit: Payer: Medicare Other | Admitting: Occupational Therapy

## 2019-01-26 ENCOUNTER — Ambulatory Visit: Payer: Medicare Other | Admitting: Rehabilitative and Restorative Service Providers"

## 2019-01-27 ENCOUNTER — Ambulatory Visit: Payer: Medicare Other | Admitting: Occupational Therapy

## 2019-01-28 ENCOUNTER — Ambulatory Visit: Payer: Medicare Other | Admitting: Rehabilitative and Restorative Service Providers"

## 2019-01-29 LAB — URINE CULTURE: Culture: >=100000 — AB

## 2019-01-30 ENCOUNTER — Telehealth: Payer: Self-pay | Admitting: Emergency Medicine

## 2019-01-30 NOTE — Telephone Encounter (Signed)
Post ED Visit - Positive Culture Follow-up  Culture report reviewed by antimicrobial stewardship pharmacist:  []  Enzo Bi, Pharm.D. []  Celedonio Miyamoto, Pharm.D., BCPS AQ-ID []  Garvin Fila, Pharm.D., BCPS []  Georgina Pillion, Pharm.D., BCPS []  [place], [place].D., BCPS, AAHIVP []  Estella Husk, Pharm.D., BCPS, AAHIVP []  Lysle Pearl, PharmD, BCPS []  Phillips Climes, PharmD, BCPS []  Agapito Games, PharmD, BCPS [x]  Vinnie Level, PharmD  Positive urine culture Treated with Ciprofloxacin, organism sensitive to the same and no further patient follow-up is required at this time.  Carollee Herter Davyd Podgorski 01/30/2019, 1:05 PM

## 2019-01-31 ENCOUNTER — Ambulatory Visit: Payer: Medicare Other | Admitting: Occupational Therapy

## 2019-01-31 ENCOUNTER — Ambulatory Visit: Payer: Medicare Other | Attending: Family Medicine | Admitting: Rehabilitative and Restorative Service Providers"

## 2019-02-02 ENCOUNTER — Ambulatory Visit: Payer: Medicare Other | Admitting: Rehabilitative and Restorative Service Providers"

## 2019-02-02 ENCOUNTER — Encounter (HOSPITAL_BASED_OUTPATIENT_CLINIC_OR_DEPARTMENT_OTHER): Payer: Medicare Other | Attending: Internal Medicine

## 2019-02-02 ENCOUNTER — Ambulatory Visit: Payer: Medicare Other | Admitting: Occupational Therapy

## 2019-02-02 DIAGNOSIS — G8250 Quadriplegia, unspecified: Secondary | ICD-10-CM | POA: Insufficient documentation

## 2019-02-02 DIAGNOSIS — L89314 Pressure ulcer of right buttock, stage 4: Secondary | ICD-10-CM | POA: Insufficient documentation

## 2019-02-02 DIAGNOSIS — L89224 Pressure ulcer of left hip, stage 4: Secondary | ICD-10-CM | POA: Diagnosis not present

## 2019-02-02 DIAGNOSIS — L89323 Pressure ulcer of left buttock, stage 3: Secondary | ICD-10-CM | POA: Diagnosis not present

## 2019-02-07 ENCOUNTER — Ambulatory Visit: Payer: Medicare Other | Admitting: Rehabilitative and Restorative Service Providers"

## 2019-02-07 ENCOUNTER — Encounter: Payer: Medicare Other | Admitting: Occupational Therapy

## 2019-02-07 ENCOUNTER — Encounter: Payer: Self-pay | Admitting: Neurology

## 2019-02-09 ENCOUNTER — Encounter: Payer: Medicare Other | Admitting: Occupational Therapy

## 2019-02-10 ENCOUNTER — Encounter: Payer: Self-pay | Admitting: Rehabilitative and Restorative Service Providers"

## 2019-02-10 NOTE — Therapy (Signed)
[facility] [address] [place], [place], [address] Phone: [phone]   Fax:  [phone]  Patient Details  Name: [name] MRN: [id number] Date of Birth: [birth year] Referring Provider:  No ref. provider found  Encounter Date: 02/10/2019   PHYSICAL THERAPY DISCHARGE SUMMARY  Visits from Start of Care: eval only  Current functional level related to goals / functional outcomes: *patient did not return to therapy with multiple cancellation for sickness, family emergency and most frequent call stating "major medical issues."  See initial summary for patient status.     Plan: Patient agrees to discharge.  Patient goals were not met. Patient is being discharged due to not returning since the last visit.  ?????           Thank you for the referral of this patient. Rudell Cobb, MPT  [place] 02/10/2019, 10:41 AM  [facility] [address] [place], [place], [address] Phone: [phone]   Fax:  [phone]

## 2019-02-23 ENCOUNTER — Encounter (HOSPITAL_BASED_OUTPATIENT_CLINIC_OR_DEPARTMENT_OTHER): Payer: Medicare Other | Attending: Internal Medicine

## 2019-02-23 DIAGNOSIS — L89224 Pressure ulcer of left hip, stage 4: Secondary | ICD-10-CM | POA: Insufficient documentation

## 2019-02-23 DIAGNOSIS — S14109S Unspecified injury at unspecified level of cervical spinal cord, sequela: Secondary | ICD-10-CM | POA: Insufficient documentation

## 2019-02-23 DIAGNOSIS — L89314 Pressure ulcer of right buttock, stage 4: Secondary | ICD-10-CM | POA: Insufficient documentation

## 2019-02-23 DIAGNOSIS — G8222 Paraplegia, incomplete: Secondary | ICD-10-CM | POA: Insufficient documentation

## 2019-02-23 DIAGNOSIS — L89324 Pressure ulcer of left buttock, stage 4: Secondary | ICD-10-CM | POA: Insufficient documentation

## 2019-03-01 ENCOUNTER — Emergency Department (HOSPITAL_COMMUNITY)
Admission: EM | Admit: 2019-03-01 | Discharge: 2019-03-02 | Disposition: A | Discharge status: Home / Self Care | Payer: Medicare Other | Attending: Emergency Medicine | Admitting: Emergency Medicine

## 2019-03-01 ENCOUNTER — Other Ambulatory Visit: Payer: Self-pay | Admitting: Neurology

## 2019-03-01 ENCOUNTER — Encounter (HOSPITAL_COMMUNITY): Payer: Self-pay | Admitting: Emergency Medicine

## 2019-03-01 DIAGNOSIS — N3001 Acute cystitis with hematuria: Secondary | ICD-10-CM | POA: Diagnosis not present

## 2019-03-01 DIAGNOSIS — R102 Pelvic and perineal pain: Secondary | ICD-10-CM | POA: Diagnosis present

## 2019-03-01 DIAGNOSIS — Z79899 Other long term (current) drug therapy: Secondary | ICD-10-CM | POA: Insufficient documentation

## 2019-03-01 MED ORDER — HYDROMORPHONE HCL 1 MG/ML IJ SOLN
1.0000 mg | Freq: Once | INTRAMUSCULAR | Status: AC
  Administered 2019-03-02: 1 mg via INTRAVENOUS
  Filled 2019-03-01: qty 1

## 2019-03-01 MED ORDER — ONDANSETRON HCL 4 MG/2ML IJ SOLN
4.0000 mg | Freq: Once | INTRAMUSCULAR | Status: AC
  Administered 2019-03-02: 4 mg via INTRAVENOUS
  Filled 2019-03-01: qty 2

## 2019-03-01 NOTE — ED Triage Notes (Signed)
Pt from home, pelvic pain.  Pt was diagnosed w/ ovarian cyst last month and kidney infection.  Reports the pain has "gotten a lot worse".
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
## 2019-03-01 NOTE — Telephone Encounter (Signed)
[place] Database Verified LR: 02-02-2019  Qty: 90 Pending appointment: 08-22-2019

## 2019-03-02 ENCOUNTER — Emergency Department (HOSPITAL_COMMUNITY): Payer: Medicare Other

## 2019-03-02 DIAGNOSIS — N3001 Acute cystitis with hematuria: Secondary | ICD-10-CM | POA: Diagnosis not present

## 2019-03-02 LAB — CBC WITH DIFFERENTIAL/PLATELET
Abs Immature Granulocytes: 0 10*3/uL (ref 0.00–0.07)
BASOS PCT: 1 %
Basophils Absolute: 0.1 10*3/uL (ref 0.0–0.1)
EOS ABS: 0.2 10*3/uL (ref 0.0–0.5)
Eosinophils Relative: 4 %
HCT: 41.5 % (ref 36.0–46.0)
Hemoglobin: 12.9 g/dL (ref 12.0–15.0)
Immature Granulocytes: 0 %
Lymphocytes Relative: 37 %
Lymphs Abs: 2.1 10*3/uL (ref 0.7–4.0)
MCH: 26.7 pg (ref 26.0–34.0)
MCHC: 31.1 g/dL (ref 30.0–36.0)
MCV: 85.9 fL (ref 80.0–100.0)
Monocytes Absolute: 0.5 10*3/uL (ref 0.1–1.0)
Monocytes Relative: 8 %
Neutro Abs: 2.9 10*3/uL (ref 1.7–7.7)
Neutrophils Relative %: 50 %
Platelets: 165 10*3/uL (ref 150–400)
RBC: 4.83 MIL/uL (ref 3.87–5.11)
RDW: 14.9 % (ref 11.5–15.5)
WBC: 5.8 10*3/uL (ref 4.0–10.5)
nRBC: 0 % (ref 0.0–0.2)

## 2019-03-02 LAB — URINALYSIS, ROUTINE W REFLEX MICROSCOPIC
Bilirubin Urine: NEGATIVE
Glucose, UA: NEGATIVE mg/dL
Ketones, ur: NEGATIVE mg/dL
Nitrite: POSITIVE — AB
Protein, ur: NEGATIVE mg/dL
Specific Gravity, Urine: 1.006 (ref 1.005–1.030)
pH: 6 (ref 5.0–8.0)

## 2019-03-02 LAB — BASIC METABOLIC PANEL
Anion gap: 7 (ref 5–15)
BUN: 10 mg/dL (ref 6–20)
CALCIUM: 8.8 mg/dL — AB (ref 8.9–10.3)
CO2: 25 mmol/L (ref 22–32)
CREATININE: 0.48 mg/dL (ref 0.44–1.00)
Chloride: 104 mmol/L (ref 98–111)
GFR calc Af Amer: >60 mL/min (ref >60)
GFR calc non Af Amer: >60 mL/min (ref >60)
Glucose, Bld: 78 mg/dL (ref 70–99)
Potassium: 3.8 mmol/L (ref 3.5–5.1)
SODIUM: 136 mmol/L (ref 135–145)

## 2019-03-02 LAB — I-STAT BETA HCG BLOOD, ED (MC, WL, AP ONLY): I-stat hCG, quantitative: <5 m[IU]/mL (ref <5)

## 2019-03-02 MED ORDER — CEPHALEXIN 500 MG PO CAPS: 500.0000 mg | ORAL_CAPSULE | Freq: Two times a day (BID) | ORAL | 0 refills | Status: DC

## 2019-03-02 MED ORDER — HYDROMORPHONE HCL 1 MG/ML IJ SOLN
1.0000 mg | Freq: Once | INTRAMUSCULAR | Status: AC
  Administered 2019-03-02: 1 mg via INTRAVENOUS
  Filled 2019-03-02: qty 1

## 2019-03-02 MED ORDER — SODIUM CHLORIDE 0.9 % IV SOLN
1.0000 g | Freq: Once | INTRAVENOUS | Status: AC
  Administered 2019-03-02: 1 g via INTRAVENOUS
  Filled 2019-03-02: qty 10

## 2019-03-02 NOTE — Discharge Instructions (Signed)
Take the prescribed medication as directed. °Follow-up with your primary care doctor. °Return to the ED for new or worsening symptoms. °

## 2019-03-02 NOTE — ED Notes (Signed)
Called ptar 

## 2019-03-04 LAB — URINE CULTURE: Culture: >=100000 — AB

## 2019-03-05 ENCOUNTER — Telehealth: Payer: Self-pay | Admitting: Emergency Medicine

## 2019-03-05 NOTE — Telephone Encounter (Signed)
Post ED Visit - Positive Culture Follow-up: Successful Patient Follow-Up  Culture assessed and recommendations reviewed by:  []  Enzo Bi, Pharm.D. []  Celedonio Miyamoto, Pharm.D., BCPS AQ-ID []  Garvin Fila, Pharm.D., BCPS []  Georgina Pillion, Pharm.D., BCPS []  [place], [address].D., BCPS, AAHIVP []  Estella Husk, Pharm.D., BCPS, AAHIVP []  Lysle Pearl, PharmD, BCPS []  Phillips Climes, PharmD, BCPS []  Agapito Games, PharmD, BCPS [x]  Mervyn Gay, PharmD  Positive urine culture  []  Patient discharged without antimicrobial prescription and treatment is now indicated [x]  Organism is resistant to prescribed ED discharge antimicrobial []  Patient with positive blood cultures  Changes discussed with ED provider: Carlyle Basques PA New antibiotic prescription Bactrim, one tab PO BID x seven days Called to [facility] [address]) [phone]  Contacted patient, date 03/05/2019, time 1309   Tyffani Foglesong 03/05/2019, 1:12 PM

## 2019-03-05 NOTE — Progress Notes (Signed)
ED Antimicrobial Stewardship Positive Culture Follow Up   [name] is an 33 y.o. female who presented to [facility] on 03/01/2019 with a chief complaint of  Chief Complaint  Patient presents with  . Pelvic Pain    Recent Results (from the past 720 hour(s))  Urine culture     Status: Abnormal   Collection Time: 03/02/19 12:04 AM  Result Value Ref Range Status   Specimen Description URINE, RANDOM  Final   Special Requests   Final    NONE Performed at [facility], [address] [place], [place] [address]    Culture >=100,000 COLONIES/mL STENOTROPHOMONAS MALTOPHILIA (A)  Final   Report Status 03/04/2019 FINAL  Final   Organism ID, Bacteria STENOTROPHOMONAS MALTOPHILIA (A)  Final      Susceptibility   Stenotrophomonas maltophilia - MIC*    LEVOFLOXACIN 4 INTERMEDIATE Intermediate     TRIMETH/SULFA <=20 SENSITIVE Sensitive     * >=100,000 COLONIES/mL STENOTROPHOMONAS MALTOPHILIA    [x]  Treated with Cephalexin, organism resistant to prescribed antimicrobial   New antibiotic prescription: Bactrim SS 1 tab po bid x 7 days  ED Provider: Carlyle Basques, PA   Tera Mater 03/05/2019, 9:17 AM Clinical Pharmacist Monday - Friday phone -  [phone] Saturday - Sunday phone - [phone]

## 2019-03-09 DIAGNOSIS — L89224 Pressure ulcer of left hip, stage 4: Secondary | ICD-10-CM | POA: Diagnosis not present

## 2019-03-09 DIAGNOSIS — G8222 Paraplegia, incomplete: Secondary | ICD-10-CM | POA: Diagnosis not present

## 2019-03-09 DIAGNOSIS — S14109S Unspecified injury at unspecified level of cervical spinal cord, sequela: Secondary | ICD-10-CM | POA: Diagnosis not present

## 2019-03-09 DIAGNOSIS — L89324 Pressure ulcer of left buttock, stage 4: Secondary | ICD-10-CM | POA: Diagnosis not present

## 2019-03-09 DIAGNOSIS — L89314 Pressure ulcer of right buttock, stage 4: Secondary | ICD-10-CM | POA: Diagnosis not present

## 2019-03-23 NOTE — Telephone Encounter (Signed)
The patient called in with question about her appointment tomorrow. Explained to the patient the visit will be via telephone and all questions and concerns she can discuss with the nurse/doctor tomorrow.

## 2019-03-24 ENCOUNTER — Ambulatory Visit (INDEPENDENT_AMBULATORY_CARE_PROVIDER_SITE_OTHER): Payer: Medicare Other | Admitting: Family Medicine

## 2019-03-24 ENCOUNTER — Encounter: Payer: Self-pay | Admitting: Family Medicine

## 2019-03-24 DIAGNOSIS — N390 Urinary tract infection, site not specified: Secondary | ICD-10-CM | POA: Diagnosis not present

## 2019-03-24 DIAGNOSIS — N914 Secondary oligomenorrhea: Secondary | ICD-10-CM | POA: Diagnosis not present

## 2019-03-24 NOTE — Patient Instructions (Signed)
Polycystic Ovarian Syndrome    Polycystic ovarian syndrome (PCOS) is a common hormonal disorder among women of reproductive age. In most women with PCOS, many small fluid-filled sacs (cysts) grow on the ovaries, and the cysts are not part of a normal menstrual cycle. PCOS can cause problems with your menstrual periods and make it difficult to get pregnant. It can also cause an increased risk of miscarriage with pregnancy. If it is not treated, PCOS can lead to serious health problems, such as diabetes and heart disease.  What are the causes?  The cause of PCOS is not known, but it may be the result of a combination of certain factors, such as:  · Irregular menstrual cycle.  · High levels of certain hormones (androgens).  · Problems with the hormone that helps to control blood sugar (insulin resistance).  · Certain genes.  What increases the risk?  This condition is more likely to develop in women who have a family history of PCOS.  What are the signs or symptoms?  Symptoms of PCOS may include:  · Multiple ovarian cysts.  · Infrequent periods or no periods.  · Periods that are too frequent or too heavy.  · Unpredictable periods.  · Inability to get pregnant (infertility) because of not ovulating.  · Increased growth of hair on the face, chest, stomach, back, thumbs, thighs, or toes.  · Acne or oily skin. Acne may develop during adulthood, and it may not respond to treatment.  · Pelvic pain.  · Weight gain or obesity.  · Patches of thickened and dark brown or black skin on the neck, arms, breasts, or thighs (acanthosis nigricans).  · Excess hair growth on the face, chest, abdomen, or upper thighs (hirsutism).  How is this diagnosed?  This condition is diagnosed based on:  · Your medical history.  · A physical exam, including a pelvic exam. Your health care provider may look for areas of increased hair growth on your skin.  · Tests, such as:  ? Ultrasound. This may be used to examine the ovaries and the lining of the  uterus (endometrium) for cysts.  ? Blood tests. These may be used to check levels of sugar (glucose), female hormone (testosterone), and female hormones (estrogen and progesterone) in your blood.  How is this treated?  There is no cure for PCOS, but treatment can help to manage symptoms and prevent more health problems from developing. Treatment varies depending on:  · Your symptoms.  · Whether you want to have a baby or whether you need birth control (contraception).  Treatment may include nutrition and lifestyle changes along with:  · Progesterone hormone to start a menstrual period.  · Birth control pills to help you have regular menstrual periods.  · Medicines to make you ovulate, if you want to get pregnant.  · Medicine to reduce excessive hair growth.  · Surgery, in severe cases. This may involve making small holes in one or both of your ovaries. This decreases the amount of testosterone that your body produces.  Follow these instructions at home:  · Take over-the-counter and prescription medicines only as told by your health care provider.  · Follow a healthy meal plan. This can help you reduce the effects of PCOS.  ? Eat a healthy diet that includes lean proteins, complex carbohydrates, fresh fruits and vegetables, low-fat dairy products, and healthy fats. Make sure to eat enough fiber.  · If you are overweight, lose weight as told by your health care   provider.  ? Losing 10% of your body weight may improve symptoms.  ? Your health care provider can determine how much weight loss is best for you and can help you lose weight safely.  · Keep all follow-up visits as told by your health care provider. This is important.  Contact a health care provider if:  · Your symptoms do not get better with medicine.  · You develop new symptoms.  This information is not intended to replace advice given to you by your health care provider. Make sure you discuss any questions you have with your health care provider.  Document  Released: 04/03/2005 Document Revised: 08/05/2016 Document Reviewed: 05/25/2016  Elsevier Interactive Patient Education © 2019 [employer]

## 2019-03-24 NOTE — Progress Notes (Signed)
I connected with  [name] on 03/24/19 at  1:55 PM EDT by telephone and verified that I am speaking with the correct person using two identifiers.   I discussed the limitations, risks, security and privacy concerns of performing an evaluation and management service by telephone and the availability of in person appointments. I also discussed with the patient that there may be a patient responsible charge related to this service. The patient expressed understanding and agreed to proceed.  Osvaldo Human, RN 03/24/2019  1:57 PM
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
## 2019-03-24 NOTE — Assessment & Plan Note (Signed)
U/S is normal and reviewed. There is no evidence of PCOS. Discussed with her. Discussed plan for contraception vs. Fertility planning.

## 2019-04-06 ENCOUNTER — Encounter (HOSPITAL_BASED_OUTPATIENT_CLINIC_OR_DEPARTMENT_OTHER): Payer: Medicare Other | Attending: Physician Assistant

## 2019-04-23 ENCOUNTER — Emergency Department (HOSPITAL_COMMUNITY)
Admission: EM | Admit: 2019-04-23 | Discharge: 2019-04-23 | Disposition: A | Discharge status: Home / Self Care | Payer: Medicare Other | Attending: Emergency Medicine | Admitting: Emergency Medicine

## 2019-04-23 ENCOUNTER — Other Ambulatory Visit: Payer: Self-pay

## 2019-04-23 ENCOUNTER — Emergency Department (HOSPITAL_COMMUNITY): Payer: Medicare Other

## 2019-04-23 DIAGNOSIS — R070 Pain in throat: Secondary | ICD-10-CM | POA: Diagnosis present

## 2019-04-23 DIAGNOSIS — Y929 Unspecified place or not applicable: Secondary | ICD-10-CM | POA: Insufficient documentation

## 2019-04-23 DIAGNOSIS — Z79899 Other long term (current) drug therapy: Secondary | ICD-10-CM | POA: Insufficient documentation

## 2019-04-23 DIAGNOSIS — W500XXA Accidental hit or strike by another person, initial encounter: Secondary | ICD-10-CM | POA: Diagnosis not present

## 2019-04-23 DIAGNOSIS — S199XXA Unspecified injury of neck, initial encounter: Secondary | ICD-10-CM | POA: Diagnosis not present

## 2019-04-23 DIAGNOSIS — Y999 Unspecified external cause status: Secondary | ICD-10-CM | POA: Insufficient documentation

## 2019-04-23 DIAGNOSIS — J45909 Unspecified asthma, uncomplicated: Secondary | ICD-10-CM | POA: Insufficient documentation

## 2019-04-23 DIAGNOSIS — Y939 Activity, unspecified: Secondary | ICD-10-CM | POA: Diagnosis not present

## 2019-04-23 MED ORDER — FENTANYL CITRATE (PF) 100 MCG/2ML IJ SOLN
50.0000 ug | Freq: Once | INTRAMUSCULAR | Status: DC
  Filled 2019-04-23: qty 2

## 2019-04-23 MED ORDER — FENTANYL CITRATE (PF) 100 MCG/2ML IJ SOLN
50.0000 ug | Freq: Once | INTRAMUSCULAR | Status: AC
  Administered 2019-04-23: 50 ug via NASAL
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
## 2019-04-23 NOTE — ED Notes (Signed)
IV start attempt x1 unsuccessful, another ED RN attempted 2nd IV start using vein finder, also unsuccessful.  PA Greta Doom made aware.

## 2019-04-23 NOTE — ED Notes (Signed)
Pt notified of discharge status, requesting [facility] transportation home.

## 2019-04-23 NOTE — ED Notes (Signed)
Patient transported to CT 
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
## 2019-04-27 ENCOUNTER — Encounter (HOSPITAL_BASED_OUTPATIENT_CLINIC_OR_DEPARTMENT_OTHER): Payer: Medicare Other

## 2019-04-28 IMAGING — DX DG ABD PORTABLE 1V
1 series · 1 of 1 positions shown · non-contrast
Comparison: Abdominal radiograph 09/10/2017

CLINICAL DATA: Patient status post EGD.  Chest pain.

EXAM:
PORTABLE ABDOMEN - 1 VIEW

[abdomen kub]
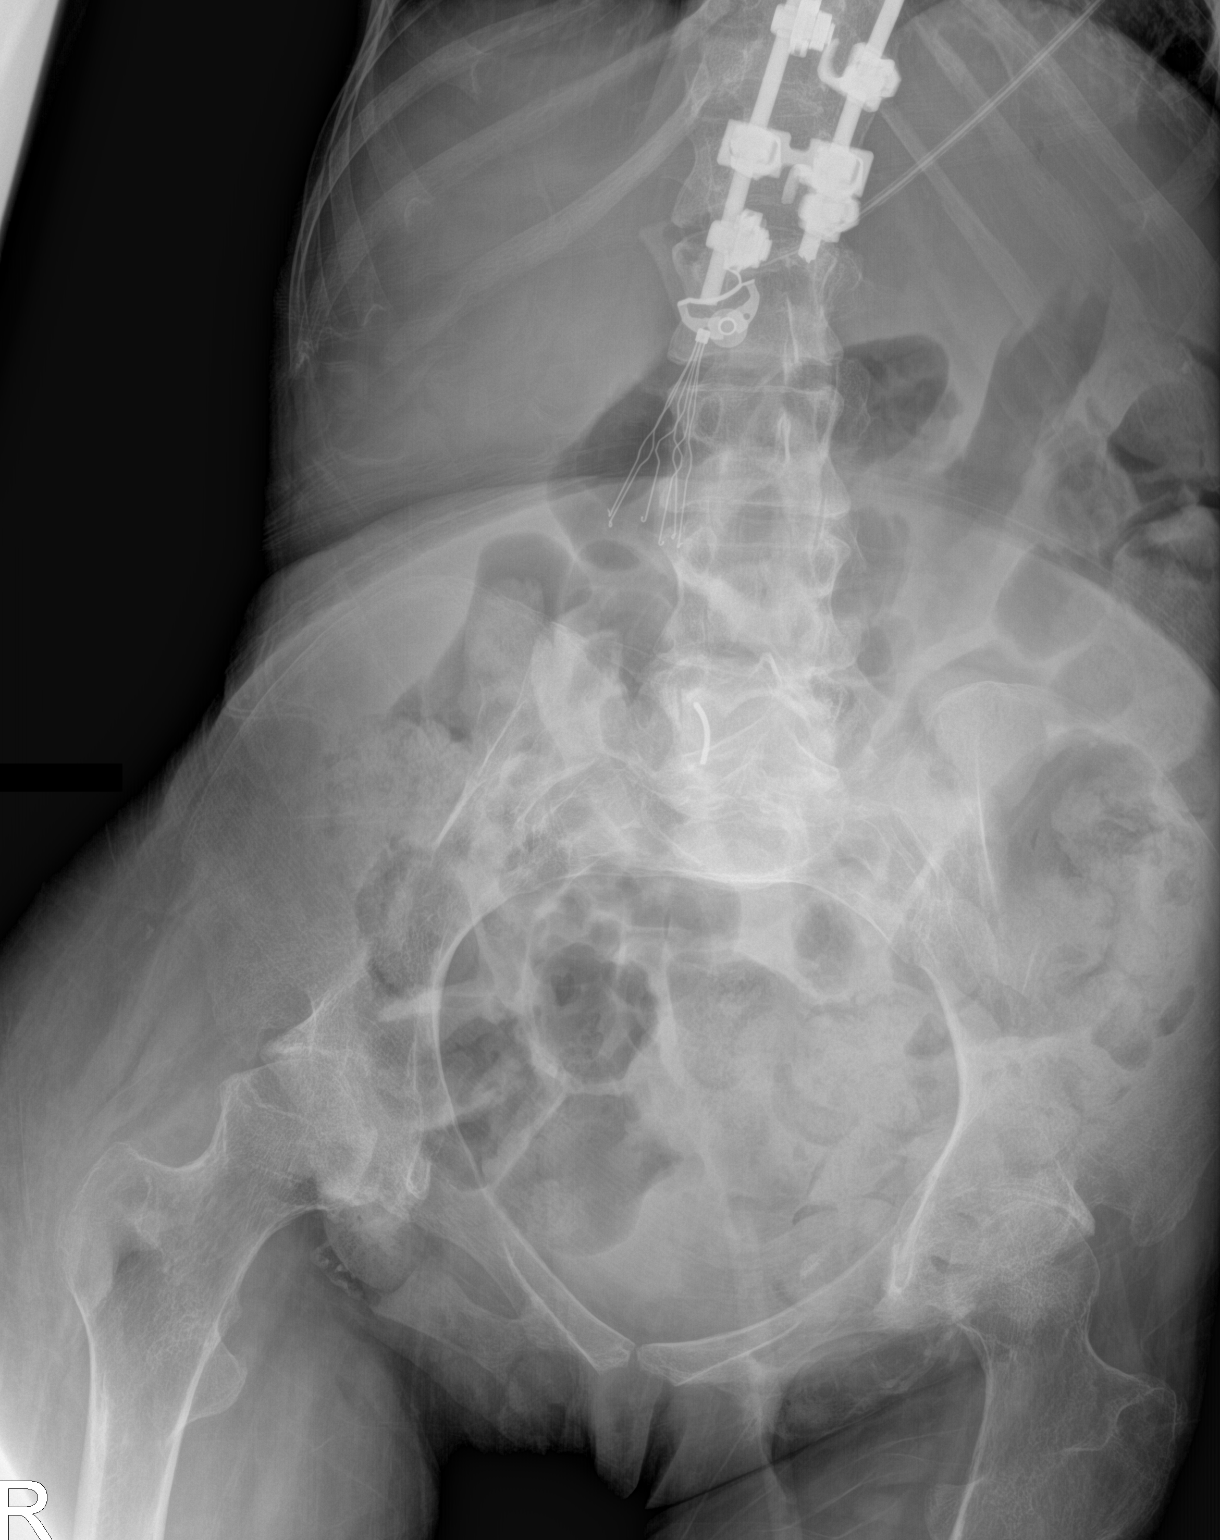

[1 of 1 positions shown; findings below may reference images not displayed]

FINDINGS: Thoracic spinal fusion hardware. IVC filter. Stool throughout the
colon. Gas is demonstrated within nondilated loops of large and
small bowel. Osteopenia.
IMPRESSION: Nonobstructed bowel gas pattern. Stool throughout the colon as can
be seen with constipation.

## 2019-05-04 ENCOUNTER — Encounter (HOSPITAL_BASED_OUTPATIENT_CLINIC_OR_DEPARTMENT_OTHER): Payer: Medicare Other | Attending: Physician Assistant

## 2019-05-04 DIAGNOSIS — L89323 Pressure ulcer of left buttock, stage 3: Secondary | ICD-10-CM | POA: Insufficient documentation

## 2019-05-04 DIAGNOSIS — G8250 Quadriplegia, unspecified: Secondary | ICD-10-CM | POA: Insufficient documentation

## 2019-05-04 DIAGNOSIS — L89224 Pressure ulcer of left hip, stage 4: Secondary | ICD-10-CM | POA: Insufficient documentation

## 2019-05-07 ENCOUNTER — Emergency Department (HOSPITAL_COMMUNITY): Payer: Medicare Other

## 2019-05-07 ENCOUNTER — Encounter (HOSPITAL_COMMUNITY): Payer: Self-pay | Admitting: Emergency Medicine

## 2019-05-07 ENCOUNTER — Emergency Department (HOSPITAL_COMMUNITY)
Admission: EM | Admit: 2019-05-07 | Discharge: 2019-05-08 | Disposition: A | Discharge status: Home / Self Care | Payer: Medicare Other | Attending: Emergency Medicine | Admitting: Emergency Medicine

## 2019-05-07 DIAGNOSIS — Z79899 Other long term (current) drug therapy: Secondary | ICD-10-CM | POA: Diagnosis not present

## 2019-05-07 DIAGNOSIS — R531 Weakness: Secondary | ICD-10-CM | POA: Insufficient documentation

## 2019-05-07 DIAGNOSIS — J45909 Unspecified asthma, uncomplicated: Secondary | ICD-10-CM | POA: Insufficient documentation

## 2019-05-07 DIAGNOSIS — Z03818 Encounter for observation for suspected exposure to other biological agents ruled out: Secondary | ICD-10-CM | POA: Insufficient documentation

## 2019-05-07 LAB — CBC WITH DIFFERENTIAL/PLATELET
Abs Immature Granulocytes: 0.02 10*3/uL (ref 0.00–0.07)
Basophils Absolute: 0 10*3/uL (ref 0.0–0.1)
Basophils Relative: 1 %
Eosinophils Absolute: 0.3 10*3/uL (ref 0.0–0.5)
Eosinophils Relative: 4 %
HCT: 43 % (ref 36.0–46.0)
Hemoglobin: 13.1 g/dL (ref 12.0–15.0)
Immature Granulocytes: 0 %
Lymphocytes Relative: 33 %
Lymphs Abs: 2.1 10*3/uL (ref 0.7–4.0)
MCH: 27.9 pg (ref 26.0–34.0)
MCHC: 30.5 g/dL (ref 30.0–36.0)
MCV: 91.5 fL (ref 80.0–100.0)
Monocytes Absolute: 0.5 10*3/uL (ref 0.1–1.0)
Monocytes Relative: 7 %
Neutro Abs: 3.4 10*3/uL (ref 1.7–7.7)
Neutrophils Relative %: 55 %
Platelets: 196 10*3/uL (ref 150–400)
RBC: 4.7 MIL/uL (ref 3.87–5.11)
RDW: 14.3 % (ref 11.5–15.5)
WBC: 6.3 10*3/uL (ref 4.0–10.5)
nRBC: 0 % (ref 0.0–0.2)

## 2019-05-07 LAB — TYPE AND SCREEN
ABO/RH(D): A POS
Antibody Screen: NEGATIVE

## 2019-05-07 LAB — URINALYSIS, ROUTINE W REFLEX MICROSCOPIC
Bilirubin Urine: NEGATIVE
Glucose, UA: NEGATIVE mg/dL
Ketones, ur: NEGATIVE mg/dL
Nitrite: NEGATIVE
Protein, ur: NEGATIVE mg/dL
Specific Gravity, Urine: 1.004 — ABNORMAL LOW (ref 1.005–1.030)
pH: 6 (ref 5.0–8.0)

## 2019-05-07 LAB — COMPREHENSIVE METABOLIC PANEL
ALT: 14 U/L (ref 0–44)
AST: 22 U/L (ref 15–41)
Albumin: 4 g/dL (ref 3.5–5.0)
Alkaline Phosphatase: 75 U/L (ref 38–126)
Anion gap: 8 (ref 5–15)
BUN: 9 mg/dL (ref 6–20)
CO2: 27 mmol/L (ref 22–32)
Calcium: 8.7 mg/dL — ABNORMAL LOW (ref 8.9–10.3)
Chloride: 100 mmol/L (ref 98–111)
Creatinine, Ser: 0.58 mg/dL (ref 0.44–1.00)
GFR calc Af Amer: >60 mL/min (ref >60)
GFR calc non Af Amer: >60 mL/min (ref >60)
Glucose, Bld: 95 mg/dL (ref 70–99)
Potassium: 4.1 mmol/L (ref 3.5–5.1)
Sodium: 135 mmol/L (ref 135–145)
Total Bilirubin: 0.2 mg/dL — ABNORMAL LOW (ref 0.3–1.2)
Total Protein: 8 g/dL (ref 6.5–8.1)

## 2019-05-07 LAB — LACTIC ACID, PLASMA: Lactic Acid, Venous: 1.1 mmol/L (ref 0.5–1.9)

## 2019-05-07 LAB — SARS CORONAVIRUS 2 BY RT PCR (HOSPITAL ORDER, PERFORMED IN ~~LOC~~ HOSPITAL LAB): SARS Coronavirus 2: NEGATIVE
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
## 2019-05-07 NOTE — ED Notes (Signed)
Bed: WA18 Expected date:  Expected time:  Means of arrival:  Comments: Negative pressure 
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
## 2019-05-08 DIAGNOSIS — R531 Weakness: Secondary | ICD-10-CM | POA: Diagnosis not present

## 2019-05-08 LAB — LACTIC ACID, PLASMA: Lactic Acid, Venous: 0.8 mmol/L (ref 0.5–1.9)

## 2019-05-08 MED ORDER — HYDROMORPHONE HCL 1 MG/ML IJ SOLN
1.0000 mg | Freq: Once | INTRAMUSCULAR | Status: AC
  Administered 2019-05-08: 01:00:00 1 mg via INTRAVENOUS
  Filled 2019-05-08: qty 1

## 2019-05-08 MED ORDER — HYDROMORPHONE HCL 1 MG/ML IJ SOLN
1.0000 mg | Freq: Once | INTRAMUSCULAR | Status: AC
  Administered 2019-05-08: 1 mg via INTRAVENOUS
  Filled 2019-05-08: qty 1

## 2019-05-08 MED ORDER — NORETHIN-ETH ESTRAD TRIPHASIC 0.5/0.75/1-35 MG-MCG PO TABS: 1.0000 | ORAL_TABLET | Freq: Every day | ORAL | 11 refills | Status: AC

## 2019-05-08 NOTE — ED Notes (Signed)
Report called to [facility]  Paper work complete

## 2019-05-08 NOTE — ED Notes (Signed)
Pt requesting vegetable soup given

## 2019-05-08 NOTE — ED Provider Notes (Signed)
12:13 AM Assumed care from Dr. Jeraldine Loots, please see their note for full history, physical and decision making until this point. In brief this is a 33 y.o. year old female who presented to the ED tonight with Weakness     Persistent weakness. Pending reeval for need of CT scan. Pain control.  Pain improved. Requests OCP's for her vaginal bleeding as she is been on in the past and this seemed to help.  I discussed the risk of DVT with those but she states she has a filter in place next risk.  She will follow-up with gynecology regarding the PCOS and vaginal bleeding.  Pain controlled the patient symptomatically much better prior to discharge.  Discharge instructions, including strict return precautions for new or worsening symptoms, given. Patient and/or family verbalized understanding and agreement with the plan as described.   Labs, studies and imaging reviewed by myself and considered in medical decision making if ordered. Imaging interpreted by radiology.  Labs Reviewed  COMPREHENSIVE METABOLIC PANEL - Abnormal; Notable for the following components:      Result Value   Calcium 8.7 (*)    Total Bilirubin 0.2 (*)    All other components within normal limits  URINALYSIS, ROUTINE W REFLEX MICROSCOPIC - Abnormal; Notable for the following components:   APPearance HAZY (*)    Specific Gravity, Urine 1.004 (*)    Hgb urine dipstick MODERATE (*)    Leukocytes,Ua LARGE (*)    Bacteria, UA FEW (*)    All other components within normal limits  SARS CORONAVIRUS 2 (HOSPITAL ORDER, PERFORMED IN [facility])  LACTIC ACID, PLASMA  CBC WITH DIFFERENTIAL/PLATELET  LACTIC ACID, PLASMA  TYPE AND SCREEN    DG Chest Port 1 View  Final Result      No follow-ups on file.    Venice Marcucci, Barbara Cower, MD 05/08/19 [phone]

## 2019-05-10 LAB — URINE CULTURE: Culture: >=100000 — AB

## 2019-05-11 ENCOUNTER — Telehealth: Payer: Self-pay | Admitting: Emergency Medicine

## 2019-05-11 NOTE — Telephone Encounter (Signed)
Post ED Visit - Positive Culture Follow-up: Successful Patient Follow-Up  Culture assessed and recommendations reviewed by:  []  Enzo Bi, Pharm.D. []  Celedonio Miyamoto, Pharm.D., BCPS AQ-ID []  Garvin Fila, Pharm.D., BCPS []  Georgina Pillion, [address].D., BCPS []  [place], [place].D., BCPS, AAHIVP []  Estella Husk, Pharm.D., BCPS, AAHIVP []  Lysle Pearl, PharmD, BCPS []  Phillips Climes, PharmD, BCPS []  Agapito Games, PharmD, BCPS []  Verlan Friends, PharmD  Positive urine culture  []  Patient discharged without antimicrobial prescription and treatment is now indicated []  Organism is resistant to prescribed ED discharge antimicrobial []  Patient with positive blood cultures  Changes discussed with ED provider: Elzie Rings PA New antibiotic prescription symptom check, if symptomatic, return to the ED for IV antibiotics  Spoke with patient who states asymptomatic  Berle Mull 05/11/2019, 3:47 PM

## 2019-05-11 NOTE — Telephone Encounter (Signed)
Post ED Visit - Positive Culture Follow-up  Culture report reviewed by antimicrobial stewardship pharmacist: [facility] []  Enzo Bi, Pharm.D. []  Celedonio Miyamoto, Pharm.D., BCPS AQ-ID []  Garvin Fila, Pharm.D., BCPS []  Georgina Pillion, Pharm.D., BCPS []  [place], [address].D., BCPS, AAHIVP []  Estella Husk, Pharm.D., BCPS, AAHIVP []  Lysle Pearl, PharmD, BCPS []  Phillips Climes, PharmD, BCPS []  Agapito Games, PharmD, BCPS []  Verlan Friends, PharmD []  Mervyn Gay, PharmD, BCPS []  Vinnie Level, PharmD  [facility] []  Len Childs, PharmD []  Greer Pickerel, PharmD []  Adalberto Cole, PharmD []  Perlie Gold, Rph []  Lonell Face) Jean Rosenthal, PharmD []  Earl Many, PharmD []  Junita Push, PharmD []  Dorna Leitz, PharmD []  Terrilee Files, PharmD []  Lynann Beaver, PharmD []  Keturah Barre, PharmD []  Loralee Pacas, PharmD []  Bernadene Person, PharmD   Positive urine culture Treated with none, symptom check, if symptomatic, return to ED for IV antibiotics.  Berle Mull 05/11/2019, 3:31 PM

## 2019-05-11 NOTE — Progress Notes (Signed)
ED Antimicrobial Stewardship Positive Culture Follow Up   [name] is an 33 y.o. female who presented to [facility] on 05/07/2019 with a chief complaint of  Chief Complaint  Patient presents with  . Weakness    Recent Results (from the past 720 hour(s))  Urine culture     Status: Abnormal   Collection Time: 05/07/19  9:46 PM  Result Value Ref Range Status   Specimen Description   Final    URINE, RANDOM Performed at [facility], [address], [place], [place] [address]    Special Requests   Final    NONE Performed at [facility], [address] [place], [place] [address]    Culture >=100,000 COLONIES/mL ALCALIGENES FAECALIS (A)  Final   Report Status 05/10/2019 FINAL  Final   Organism ID, Bacteria ALCALIGENES FAECALIS (A)  Final      Susceptibility   Alcaligenes faecalis - MIC*    CEFEPIME 8 SENSITIVE Sensitive     CEFAZOLIN >=64 RESISTANT Resistant     GENTAMICIN 4 SENSITIVE Sensitive     CIPROFLOXACIN >=4 RESISTANT Resistant     IMIPENEM 8 INTERMEDIATE Intermediate     TRIMETH/SULFA 160 RESISTANT Resistant     * >=100,000 COLONIES/mL ALCALIGENES FAECALIS  SARS Coronavirus 2 (CEPHEID - Performed in [facility] hospital lab), Hosp Order     Status: None   Collection Time: 05/07/19  9:49 PM  Result Value Ref Range Status   SARS Coronavirus 2 NEGATIVE NEGATIVE Final    Comment: (NOTE) If result is NEGATIVE SARS-CoV-2 target nucleic acids are NOT DETECTED. The SARS-CoV-2 RNA is generally detectable in upper and lower  respiratory specimens during the acute phase of infection. The lowest  concentration of SARS-CoV-2 viral copies this assay can detect is 250  copies / mL. A negative result does not preclude SARS-CoV-2 infection  and should not be used as the sole basis for treatment or other  patient management decisions.  A negative result may occur with  improper specimen collection / handling, submission of specimen other   than nasopharyngeal swab, presence of viral mutation(s) within the  areas targeted by this assay, and inadequate number of viral copies  (<250 copies / mL). A negative result must be combined with clinical  observations, patient history, and epidemiological information. If result is POSITIVE SARS-CoV-2 target nucleic acids are DETECTED. The SARS-CoV-2 RNA is generally detectable in upper and lower  respiratory specimens dur ing the acute phase of infection.  Positive  results are indicative of active infection with SARS-CoV-2.  Clinical  correlation with patient history and other diagnostic information is  necessary to determine patient infection status.  Positive results do  not rule out bacterial infection or co-infection with other viruses. If result is PRESUMPTIVE POSTIVE SARS-CoV-2 nucleic acids MAY BE PRESENT.   A presumptive positive result was obtained on the submitted specimen  and confirmed on repeat testing.  While 2019 novel coronavirus  (SARS-CoV-2) nucleic acids may be present in the submitted sample  additional confirmatory testing may be necessary for epidemiological  and / or clinical management purposes  to differentiate between  SARS-CoV-2 and other Sarbecovirus currently known to infect humans.  If clinically indicated additional testing with an alternate test  methodology [phone]) is advised. The SARS-CoV-2 RNA is generally  detectable in upper and lower respiratory sp ecimens during the acute  phase of infection. The expected result is Negative. Fact Sheet for Patients:  BoilerBrush.com.cy Fact Sheet for  Healthcare Providers: https://pope.com/https://www.fda.gov/media/136313/download This test is not yet approved or cleared by the [place] FDA and has been authorized for detection and/or diagnosis of SARS-CoV-2 by FDA under an Emergency Use Authorization (EUA).  This EUA will remain in effect (meaning this test can be used) for the duration of  the COVID-19 declaration under Section 564(b)(1) of the Act, 21 U.S.C. section 360bbb-3(b)(1), unless the authorization is terminated or revoked sooner. Performed at [facility], [address] [place], [place] [address]     [x]  Patient discharged originally without antimicrobial agent and treatment may now indicated  ED Provider: Ralph LeydenBritni Henderly, PA  Plan/Recommendation:  On ED visit patient was afebrile, WBC WNL.   Call patient to assess for any signs/symptoms of UTI. If patient is experiencing any of the following symptoms, instruct patient to return to ED for IV antibiotics: -fever -chills -nausea/vomiting -urinary frequency -dysuria -abdominal tenderness -flank pain -hematuria -suprapubic pain  Cindi CarbonMary M Kylee Umana 05/11/2019, 10:46 AM Clinical Pharmacist [phone]

## 2019-05-24 ENCOUNTER — Emergency Department (HOSPITAL_COMMUNITY): Payer: Medicare Other

## 2019-05-24 ENCOUNTER — Emergency Department (HOSPITAL_COMMUNITY)
Admission: EM | Admit: 2019-05-24 | Discharge: 2019-05-24 | Disposition: A | Discharge status: Home / Self Care | Payer: Medicare Other | Attending: Emergency Medicine | Admitting: Emergency Medicine

## 2019-05-24 ENCOUNTER — Encounter (HOSPITAL_COMMUNITY): Payer: Self-pay | Admitting: Emergency Medicine

## 2019-05-24 ENCOUNTER — Other Ambulatory Visit: Payer: Self-pay

## 2019-05-24 DIAGNOSIS — J45909 Unspecified asthma, uncomplicated: Secondary | ICD-10-CM | POA: Insufficient documentation

## 2019-05-24 DIAGNOSIS — N3001 Acute cystitis with hematuria: Secondary | ICD-10-CM | POA: Diagnosis not present

## 2019-05-24 DIAGNOSIS — Z79899 Other long term (current) drug therapy: Secondary | ICD-10-CM | POA: Insufficient documentation

## 2019-05-24 DIAGNOSIS — R7989 Other specified abnormal findings of blood chemistry: Secondary | ICD-10-CM | POA: Diagnosis not present

## 2019-05-24 DIAGNOSIS — R0789 Other chest pain: Secondary | ICD-10-CM | POA: Insufficient documentation

## 2019-05-24 LAB — CBC WITH DIFFERENTIAL/PLATELET
Abs Immature Granulocytes: 0.01 10*3/uL (ref 0.00–0.07)
Basophils Absolute: 0.1 10*3/uL (ref 0.0–0.1)
Basophils Relative: 1 %
Eosinophils Absolute: 0.2 10*3/uL (ref 0.0–0.5)
Eosinophils Relative: 3 %
HCT: 40.8 % (ref 36.0–46.0)
Hemoglobin: 12.8 g/dL (ref 12.0–15.0)
Immature Granulocytes: 0 %
Lymphocytes Relative: 30 %
Lymphs Abs: 1.8 10*3/uL (ref 0.7–4.0)
MCH: 28.3 pg (ref 26.0–34.0)
MCHC: 31.4 g/dL (ref 30.0–36.0)
MCV: 90.1 fL (ref 80.0–100.0)
Monocytes Absolute: 0.4 10*3/uL (ref 0.1–1.0)
Monocytes Relative: 7 %
Neutro Abs: 3.5 10*3/uL (ref 1.7–7.7)
Neutrophils Relative %: 59 %
Platelets: 164 10*3/uL (ref 150–400)
RBC: 4.53 MIL/uL (ref 3.87–5.11)
RDW: 13.6 % (ref 11.5–15.5)
WBC: 5.9 10*3/uL (ref 4.0–10.5)
nRBC: 0 % (ref 0.0–0.2)

## 2019-05-24 LAB — BASIC METABOLIC PANEL
Anion gap: 11 (ref 5–15)
BUN: 10 mg/dL (ref 6–20)
CO2: 25 mmol/L (ref 22–32)
Calcium: 8.8 mg/dL — ABNORMAL LOW (ref 8.9–10.3)
Chloride: 103 mmol/L (ref 98–111)
Creatinine, Ser: 0.83 mg/dL (ref 0.44–1.00)
GFR calc Af Amer: >60 mL/min (ref >60)
GFR calc non Af Amer: >60 mL/min (ref >60)
Glucose, Bld: 88 mg/dL (ref 70–99)
Potassium: 3.7 mmol/L (ref 3.5–5.1)
Sodium: 139 mmol/L (ref 135–145)

## 2019-05-24 LAB — URINALYSIS, ROUTINE W REFLEX MICROSCOPIC
Bilirubin Urine: NEGATIVE
Glucose, UA: NEGATIVE mg/dL
Ketones, ur: NEGATIVE mg/dL
Nitrite: NEGATIVE
Protein, ur: 30 mg/dL — AB
Specific Gravity, Urine: 1.044 — ABNORMAL HIGH (ref 1.005–1.030)
pH: 6 (ref 5.0–8.0)

## 2019-05-24 LAB — LACTIC ACID, PLASMA: Lactic Acid, Venous: 1.1 mmol/L (ref 0.5–1.9)

## 2019-05-24 LAB — TROPONIN I
Troponin I: <0.03 ng/mL (ref <0.03)
Troponin I: <0.03 ng/mL (ref <0.03)

## 2019-05-24 LAB — D-DIMER, QUANTITATIVE: D-Dimer, Quant: 2.52 ug/mL-FEU — ABNORMAL HIGH (ref 0.00–0.50)

## 2019-05-24 LAB — I-STAT BETA HCG BLOOD, ED (MC, WL, AP ONLY): I-stat hCG, quantitative: <5 m[IU]/mL (ref <5)

## 2019-05-24 MED ORDER — ONDANSETRON HCL 4 MG/2ML IJ SOLN
4.0000 mg | Freq: Once | INTRAMUSCULAR | Status: AC
  Administered 2019-05-24: 4 mg via INTRAVENOUS
  Filled 2019-05-24: qty 2

## 2019-05-24 MED ORDER — CEPHALEXIN 500 MG PO CAPS: 500.0000 mg | ORAL_CAPSULE | Freq: Four times a day (QID) | ORAL | 0 refills | Status: DC

## 2019-05-24 MED ORDER — HYDROMORPHONE HCL 1 MG/ML IJ SOLN
1.0000 mg | Freq: Once | INTRAMUSCULAR | Status: AC
  Administered 2019-05-24: 05:00:00 1 mg via INTRAVENOUS
  Filled 2019-05-24: qty 1

## 2019-05-24 MED ORDER — HYDROMORPHONE HCL 1 MG/ML IJ SOLN
1.0000 mg | Freq: Once | INTRAMUSCULAR | Status: AC
  Administered 2019-05-24: 02:00:00 1 mg via INTRAVENOUS
  Filled 2019-05-24: qty 1

## 2019-05-24 MED ORDER — IOHEXOL 350 MG/ML SOLN
80.0000 mL | Freq: Once | INTRAVENOUS | Status: AC | PRN
  Administered 2019-05-24: 80 mL via INTRAVENOUS

## 2019-05-24 MED ORDER — SODIUM CHLORIDE 0.9% FLUSH
3.0000 mL | Freq: Once | INTRAVENOUS | Status: AC
  Administered 2019-05-24: 3 mL via INTRAVENOUS

## 2019-05-24 MED ORDER — HYDROMORPHONE HCL 1 MG/ML IJ SOLN
1.0000 mg | Freq: Once | INTRAMUSCULAR | Status: AC
  Administered 2019-05-24: 1 mg via INTRAVENOUS
  Filled 2019-05-24: qty 1

## 2019-05-24 MED ORDER — ONDANSETRON 4 MG PO TBDP: 4.0000 mg | ORAL_TABLET | Freq: Three times a day (TID) | ORAL | 1 refills | Status: DC | PRN

## 2019-05-24 MED ORDER — SODIUM CHLORIDE 0.9 % IV SOLN
1.0000 g | Freq: Once | INTRAVENOUS | Status: AC
  Administered 2019-05-24: 1 g via INTRAVENOUS
  Filled 2019-05-24: qty 10

## 2019-05-24 MED ORDER — HYDROMORPHONE HCL 2 MG PO TABS
4.0000 mg | ORAL_TABLET | Freq: Once | ORAL | Status: AC
  Administered 2019-05-24: 4 mg via ORAL
  Filled 2019-05-24: qty 2

## 2019-05-24 NOTE — ED Notes (Signed)
[facility] called for transport home. 

## 2019-05-24 NOTE — Discharge Instructions (Signed)
Take the Keflex as directed for the next 7 days.  Return for any new or worse symptoms.  Work-up for the chest pain without any acute findings.  Take Zofran as needed for nausea.  Take your pain medication that you have at home.  Follow-up with your new primary care doctor.

## 2019-05-24 NOTE — ED Notes (Signed)
Patient transported to CT 
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
## 2019-05-24 NOTE — ED Triage Notes (Signed)
Per EMS, pt from home began experiencing SOB which was followed by chest pain that moves to her back tonight.  Initial bp was 92/40 but after NS she was 120/64.  Given 4mg  of zofran in route.

## 2019-05-24 NOTE — ED Provider Notes (Signed)
Patient's urinalysis seems to be more worrisome for infection than her baseline urinalysis.  It is understandable that she has a suprapubic catheter in place and urine will never appear normal.  Culture will help sort this out but in the meantime patient given 1 g of Rocephin and will be continued on Keflex.  Patient was on Keflex in May which seemed to help.  Her culture from May though showed things that would be resistant to to Keflex.  But she did get better.  So will go ahead and start her back on the Keflex for now.  And then culture can direct treatment.  Patient's work-up for the chest pain and shortness of breath is all negative.  Patient will be discharged home with the Keflex and some Zofran ODT that she is out of.  Patient has pain medication to take at home.   Vanetta Mulders, MD 05/24/19 1029

## 2019-05-25 ENCOUNTER — Encounter (HOSPITAL_BASED_OUTPATIENT_CLINIC_OR_DEPARTMENT_OTHER): Payer: Medicare Other | Attending: Physician Assistant

## 2019-05-25 DIAGNOSIS — L89324 Pressure ulcer of left buttock, stage 4: Secondary | ICD-10-CM | POA: Insufficient documentation

## 2019-05-25 DIAGNOSIS — T148XXS Other injury of unspecified body region, sequela: Secondary | ICD-10-CM | POA: Insufficient documentation

## 2019-05-25 DIAGNOSIS — L89224 Pressure ulcer of left hip, stage 4: Secondary | ICD-10-CM | POA: Insufficient documentation

## 2019-05-25 DIAGNOSIS — G8222 Paraplegia, incomplete: Secondary | ICD-10-CM | POA: Insufficient documentation

## 2019-05-26 LAB — URINE CULTURE

## 2019-06-10 ENCOUNTER — Other Ambulatory Visit: Payer: Self-pay | Admitting: Neurology

## 2019-06-12 ENCOUNTER — Encounter (HOSPITAL_COMMUNITY): Payer: Self-pay

## 2019-06-12 ENCOUNTER — Emergency Department (HOSPITAL_COMMUNITY)
Admission: EM | Admit: 2019-06-12 | Discharge: 2019-06-13 | Disposition: A | Discharge status: Home / Self Care | Payer: Medicare Other | Attending: Emergency Medicine | Admitting: Emergency Medicine

## 2019-06-12 ENCOUNTER — Other Ambulatory Visit: Payer: Self-pay

## 2019-06-12 DIAGNOSIS — K5900 Constipation, unspecified: Secondary | ICD-10-CM | POA: Insufficient documentation

## 2019-06-12 DIAGNOSIS — G904 Autonomic dysreflexia: Secondary | ICD-10-CM | POA: Diagnosis not present

## 2019-06-12 DIAGNOSIS — Z79899 Other long term (current) drug therapy: Secondary | ICD-10-CM | POA: Diagnosis not present

## 2019-06-12 DIAGNOSIS — R109 Unspecified abdominal pain: Secondary | ICD-10-CM | POA: Diagnosis present

## 2019-06-12 DIAGNOSIS — G8250 Quadriplegia, unspecified: Secondary | ICD-10-CM | POA: Insufficient documentation

## 2019-06-12 DIAGNOSIS — J45909 Unspecified asthma, uncomplicated: Secondary | ICD-10-CM | POA: Diagnosis not present

## 2019-06-12 LAB — CBC WITH DIFFERENTIAL/PLATELET
Abs Immature Granulocytes: 0.02 10*3/uL (ref 0.00–0.07)
Basophils Absolute: 0.1 10*3/uL (ref 0.0–0.1)
Basophils Relative: 1 %
Eosinophils Absolute: 0.2 10*3/uL (ref 0.0–0.5)
Eosinophils Relative: 3 %
HCT: 36.1 % (ref 36.0–46.0)
Hemoglobin: 11.1 g/dL — ABNORMAL LOW (ref 12.0–15.0)
Immature Granulocytes: 0 %
Lymphocytes Relative: 23 %
Lymphs Abs: 1.8 10*3/uL (ref 0.7–4.0)
MCH: 27.7 pg (ref 26.0–34.0)
MCHC: 30.7 g/dL (ref 30.0–36.0)
MCV: 90 fL (ref 80.0–100.0)
Monocytes Absolute: 0.7 10*3/uL (ref 0.1–1.0)
Monocytes Relative: 9 %
Neutro Abs: 5 10*3/uL (ref 1.7–7.7)
Neutrophils Relative %: 64 %
Platelets: 185 10*3/uL (ref 150–400)
RBC: 4.01 MIL/uL (ref 3.87–5.11)
RDW: 12.9 % (ref 11.5–15.5)
WBC: 7.7 10*3/uL (ref 4.0–10.5)
nRBC: 0 % (ref 0.0–0.2)

## 2019-06-12 LAB — URINALYSIS, ROUTINE W REFLEX MICROSCOPIC
Bilirubin Urine: NEGATIVE
Glucose, UA: NEGATIVE mg/dL
Ketones, ur: NEGATIVE mg/dL
Nitrite: NEGATIVE
Protein, ur: 30 mg/dL — AB
Specific Gravity, Urine: 1.025 (ref 1.005–1.030)
pH: 5 (ref 5.0–8.0)

## 2019-06-12 LAB — PREGNANCY, URINE: Preg Test, Ur: NEGATIVE

## 2019-06-12 MED ORDER — HYDROMORPHONE HCL 1 MG/ML IJ SOLN
1.0000 mg | Freq: Once | INTRAMUSCULAR | Status: AC
  Administered 2019-06-12: 1 mg via INTRAVENOUS
  Filled 2019-06-12: qty 1

## 2019-06-12 MED ORDER — ONDANSETRON HCL 4 MG/2ML IJ SOLN
4.0000 mg | Freq: Once | INTRAMUSCULAR | Status: AC
  Administered 2019-06-12: 4 mg via INTRAVENOUS
  Filled 2019-06-12: qty 2

## 2019-06-12 NOTE — ED Notes (Signed)
Bed: GG26 Expected date:  Expected time:  Means of arrival:  Comments: EMS 33 yo female low back pain and back pain-autonomic dysreflexia

## 2019-06-12 NOTE — ED Triage Notes (Signed)
Pt BIB EMS from home. Pt c/o low back pain, kidney pain, stomach pain, dizziness, hot and cold flashes, and diaphoresis. Pt has suprapubic catheter.   [place]

## 2019-06-12 NOTE — ED Notes (Signed)
ED Provider at bedside. 

## 2019-06-12 NOTE — ED Notes (Signed)
Attempt x 1 with ultrasound for IV-blood obtained-IV unsuccessful-attempt x 1 peripheral IV unsuccessful

## 2019-06-12 NOTE — ED Notes (Signed)
Pt states blood pressures 70/40's are normal for her

## 2019-06-13 ENCOUNTER — Emergency Department (HOSPITAL_COMMUNITY): Payer: Medicare Other

## 2019-06-13 ENCOUNTER — Encounter (HOSPITAL_COMMUNITY): Payer: Self-pay

## 2019-06-13 DIAGNOSIS — K5900 Constipation, unspecified: Secondary | ICD-10-CM | POA: Diagnosis not present

## 2019-06-13 LAB — COMPREHENSIVE METABOLIC PANEL
ALT: 10 U/L (ref 0–44)
AST: 13 U/L — ABNORMAL LOW (ref 15–41)
Albumin: 3.2 g/dL — ABNORMAL LOW (ref 3.5–5.0)
Alkaline Phosphatase: 59 U/L (ref 38–126)
Anion gap: 9 (ref 5–15)
BUN: 19 mg/dL (ref 6–20)
CO2: 26 mmol/L (ref 22–32)
Calcium: 8.4 mg/dL — ABNORMAL LOW (ref 8.9–10.3)
Chloride: 101 mmol/L (ref 98–111)
Creatinine, Ser: 0.46 mg/dL (ref 0.44–1.00)
GFR calc Af Amer: >60 mL/min (ref >60)
GFR calc non Af Amer: >60 mL/min (ref >60)
Glucose, Bld: 93 mg/dL (ref 70–99)
Potassium: 4 mmol/L (ref 3.5–5.1)
Sodium: 136 mmol/L (ref 135–145)
Total Bilirubin: 0.2 mg/dL — ABNORMAL LOW (ref 0.3–1.2)
Total Protein: 6.9 g/dL (ref 6.5–8.1)

## 2019-06-13 LAB — LACTIC ACID, PLASMA: Lactic Acid, Venous: 0.6 mmol/L (ref 0.5–1.9)

## 2019-06-13 LAB — LIPASE, BLOOD: Lipase: 24 U/L (ref 11–51)

## 2019-06-13 MED ORDER — SODIUM CHLORIDE (PF) 0.9 % IJ SOLN
INTRAMUSCULAR | Status: AC
  Filled 2019-06-13: qty 50

## 2019-06-13 MED ORDER — PROMETHAZINE HCL 25 MG/ML IJ SOLN
12.5000 mg | Freq: Once | INTRAMUSCULAR | Status: AC
  Administered 2019-06-13: 01:00:00 12.5 mg via INTRAVENOUS
  Filled 2019-06-13: qty 1

## 2019-06-13 MED ORDER — SUCRALFATE 1 GM/10ML PO SUSP: 1.0000 g | Freq: Three times a day (TID) | ORAL | 0 refills | Status: DC

## 2019-06-13 MED ORDER — HYDROMORPHONE HCL 1 MG/ML IJ SOLN
1.0000 mg | Freq: Once | INTRAMUSCULAR | Status: AC
  Administered 2019-06-13: 02:00:00 1 mg via INTRAVENOUS
  Filled 2019-06-13: qty 1

## 2019-06-13 MED ORDER — IOHEXOL 300 MG/ML  SOLN
30.0000 mL | Freq: Once | INTRAMUSCULAR | Status: AC | PRN
  Administered 2019-06-13: 30 mL via ORAL

## 2019-06-13 MED ORDER — IOHEXOL 300 MG/ML  SOLN
100.0000 mL | Freq: Once | INTRAMUSCULAR | Status: AC | PRN
  Administered 2019-06-13: 100 mL via INTRAVENOUS
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
## 2019-06-13 NOTE — Discharge Instructions (Addendum)
It is important that you stay well hydrated to try to manage constipation. Use miralax and stool softeners for constipation. Take carafate before eating and bed to decrease stomach pain.  Follow up with your primary care for recheck. Return to the ER if you develop fevers, vomiting, or any new, worsening, or concerning symptoms.

## 2019-06-13 NOTE — ED Notes (Signed)
Patient in CT

## 2019-06-14 NOTE — Telephone Encounter (Signed)
Error

## 2019-06-15 DIAGNOSIS — L89224 Pressure ulcer of left hip, stage 4: Secondary | ICD-10-CM | POA: Diagnosis not present

## 2019-06-15 DIAGNOSIS — T148XXS Other injury of unspecified body region, sequela: Secondary | ICD-10-CM | POA: Diagnosis not present

## 2019-06-15 DIAGNOSIS — L89324 Pressure ulcer of left buttock, stage 4: Secondary | ICD-10-CM | POA: Diagnosis not present

## 2019-06-15 DIAGNOSIS — G8222 Paraplegia, incomplete: Secondary | ICD-10-CM | POA: Diagnosis not present

## 2019-06-15 LAB — URINE CULTURE: Culture: 80000 — AB

## 2019-06-27 ENCOUNTER — Ambulatory Visit: Payer: Medicare Other | Admitting: Neurology

## 2019-07-13 ENCOUNTER — Encounter: Payer: Self-pay | Admitting: Nurse Practitioner

## 2019-07-18 NOTE — Therapy (Signed)
[facility] [address] [place], [place], [address] Phone: [phone]   Fax:  [phone]  Patient Details  Name: [name] MRN: [id number] Date of Birth: 14-Nov-1986 Referring Provider:  No ref. provider found  Encounter Date: 07/18/2019  Pt did not return after initial evaluation, therefore will d/c episode of care at this time.   [name] Bullocks, OTR/L 07/18/2019, 1:51 PM  [facility] [address] [place], [place], [address] Phone: [phone]   Fax:  [phone]

## 2019-07-23 ENCOUNTER — Emergency Department (HOSPITAL_COMMUNITY)
Admission: EM | Admit: 2019-07-23 | Discharge: 2019-07-23 | Disposition: A | Discharge status: Home / Self Care | Payer: Medicare Other | Attending: Emergency Medicine | Admitting: Emergency Medicine

## 2019-07-23 ENCOUNTER — Other Ambulatory Visit: Payer: Self-pay

## 2019-07-23 ENCOUNTER — Encounter (HOSPITAL_COMMUNITY): Payer: Self-pay

## 2019-07-23 DIAGNOSIS — Z885 Allergy status to narcotic agent status: Secondary | ICD-10-CM | POA: Diagnosis not present

## 2019-07-23 DIAGNOSIS — N3000 Acute cystitis without hematuria: Secondary | ICD-10-CM | POA: Insufficient documentation

## 2019-07-23 DIAGNOSIS — R1032 Left lower quadrant pain: Secondary | ICD-10-CM | POA: Diagnosis present

## 2019-07-23 DIAGNOSIS — R1030 Lower abdominal pain, unspecified: Secondary | ICD-10-CM

## 2019-07-23 DIAGNOSIS — G8220 Paraplegia, unspecified: Secondary | ICD-10-CM | POA: Diagnosis not present

## 2019-07-23 DIAGNOSIS — Z888 Allergy status to other drugs, medicaments and biological substances status: Secondary | ICD-10-CM | POA: Diagnosis not present

## 2019-07-23 DIAGNOSIS — Z79899 Other long term (current) drug therapy: Secondary | ICD-10-CM | POA: Diagnosis not present

## 2019-07-23 DIAGNOSIS — Z79891 Long term (current) use of opiate analgesic: Secondary | ICD-10-CM | POA: Insufficient documentation

## 2019-07-23 DIAGNOSIS — Z880 Allergy status to penicillin: Secondary | ICD-10-CM | POA: Insufficient documentation

## 2019-07-23 LAB — CBC WITH DIFFERENTIAL/PLATELET
Abs Immature Granulocytes: 0.03 10*3/uL (ref 0.00–0.07)
Basophils Absolute: 0.1 10*3/uL (ref 0.0–0.1)
Basophils Relative: 1 %
Eosinophils Absolute: 0.2 10*3/uL (ref 0.0–0.5)
Eosinophils Relative: 3 %
HCT: 36.3 % (ref 36.0–46.0)
Hemoglobin: 11.2 g/dL — ABNORMAL LOW (ref 12.0–15.0)
Immature Granulocytes: 0 %
Lymphocytes Relative: 26 %
Lymphs Abs: 1.9 10*3/uL (ref 0.7–4.0)
MCH: 26.5 pg (ref 26.0–34.0)
MCHC: 30.9 g/dL (ref 30.0–36.0)
MCV: 86 fL (ref 80.0–100.0)
Monocytes Absolute: 0.5 10*3/uL (ref 0.1–1.0)
Monocytes Relative: 6 %
Neutro Abs: 4.5 10*3/uL (ref 1.7–7.7)
Neutrophils Relative %: 64 %
Platelets: 228 10*3/uL (ref 150–400)
RBC: 4.22 MIL/uL (ref 3.87–5.11)
RDW: 14.2 % (ref 11.5–15.5)
WBC: 7.1 10*3/uL (ref 4.0–10.5)
nRBC: 0 % (ref 0.0–0.2)

## 2019-07-23 LAB — URINALYSIS, ROUTINE W REFLEX MICROSCOPIC
Bilirubin Urine: NEGATIVE
Glucose, UA: NEGATIVE mg/dL
Ketones, ur: NEGATIVE mg/dL
Nitrite: POSITIVE — AB
Protein, ur: 30 mg/dL — AB
Specific Gravity, Urine: 1.009 (ref 1.005–1.030)
pH: 6 (ref 5.0–8.0)

## 2019-07-23 LAB — BASIC METABOLIC PANEL
Anion gap: 9 (ref 5–15)
BUN: 12 mg/dL (ref 6–20)
CO2: 25 mmol/L (ref 22–32)
Calcium: 8.2 mg/dL — ABNORMAL LOW (ref 8.9–10.3)
Chloride: 103 mmol/L (ref 98–111)
Creatinine, Ser: 0.52 mg/dL (ref 0.44–1.00)
GFR calc Af Amer: >60 mL/min (ref >60)
GFR calc non Af Amer: >60 mL/min (ref >60)
Glucose, Bld: 89 mg/dL (ref 70–99)
Potassium: 4.4 mmol/L (ref 3.5–5.1)
Sodium: 137 mmol/L (ref 135–145)

## 2019-07-23 MED ORDER — SODIUM CHLORIDE 0.9 % IV BOLUS
500.0000 mL | Freq: Once | INTRAVENOUS | Status: AC
  Administered 2019-07-23: 500 mL via INTRAVENOUS

## 2019-07-23 MED ORDER — SODIUM CHLORIDE 0.9 % IV SOLN
1.0000 g | Freq: Once | INTRAVENOUS | Status: AC
  Administered 2019-07-23: 1 g via INTRAVENOUS
  Filled 2019-07-23: qty 10

## 2019-07-23 MED ORDER — HYDROMORPHONE HCL 1 MG/ML IJ SOLN
1.0000 mg | Freq: Once | INTRAMUSCULAR | Status: AC
  Administered 2019-07-23: 1 mg via INTRAVENOUS
  Filled 2019-07-23: qty 1

## 2019-07-23 MED ORDER — ONDANSETRON 4 MG PO TBDP: ORAL_TABLET | ORAL | 0 refills | Status: DC

## 2019-07-23 MED ORDER — SULFAMETHOXAZOLE-TRIMETHOPRIM 800-160 MG PO TABS: 1.0000 | ORAL_TABLET | Freq: Two times a day (BID) | ORAL | 0 refills | Status: AC

## 2019-07-23 NOTE — ED Notes (Signed)
Pt given water and crackers and able to keep it down without difficulty
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
## 2019-07-23 NOTE — ED Triage Notes (Signed)
Pt BIB EMS from home. Pt reports lower abdominal pain and pressure and then release of pressure yesterday. Pt also reports lower back pain and states that her urine has been dark and cloudy lately. Pt has hx of paraplegia from spinal cord injury and PCOS.  BP 80/50 (pt reports is normal) HR 66 98% RA

## 2019-07-25 LAB — URINE CULTURE: Culture: >=100000 — AB

## 2019-07-27 ENCOUNTER — Encounter (HOSPITAL_BASED_OUTPATIENT_CLINIC_OR_DEPARTMENT_OTHER): Payer: Medicare Other | Attending: Internal Medicine

## 2019-07-27 ENCOUNTER — Other Ambulatory Visit: Payer: Self-pay

## 2019-07-27 DIAGNOSIS — L89323 Pressure ulcer of left buttock, stage 3: Secondary | ICD-10-CM | POA: Insufficient documentation

## 2019-07-27 DIAGNOSIS — G8250 Quadriplegia, unspecified: Secondary | ICD-10-CM | POA: Insufficient documentation

## 2019-07-27 DIAGNOSIS — L89224 Pressure ulcer of left hip, stage 4: Secondary | ICD-10-CM | POA: Insufficient documentation

## 2019-08-03 ENCOUNTER — Other Ambulatory Visit: Payer: Self-pay

## 2019-08-03 ENCOUNTER — Encounter: Payer: Self-pay | Admitting: Nurse Practitioner

## 2019-08-03 ENCOUNTER — Ambulatory Visit (INDEPENDENT_AMBULATORY_CARE_PROVIDER_SITE_OTHER): Payer: Medicare Other | Admitting: Nurse Practitioner

## 2019-08-03 VITALS — BP 80/58 | HR 56 | Temp 98.4°F

## 2019-08-03 DIAGNOSIS — K219 Gastro-esophageal reflux disease without esophagitis: Secondary | ICD-10-CM | POA: Diagnosis not present

## 2019-08-03 MED ORDER — PANTOPRAZOLE SODIUM 40 MG PO TBEC: 40.0000 mg | DELAYED_RELEASE_TABLET | ORAL | 3 refills | Status: DC

## 2019-08-03 NOTE — Patient Instructions (Signed)
If you are age 33 or older, your body mass index should be between 23-30. Your There is no height or weight on file to calculate BMI. If this is out of the aforementioned range listed, please consider follow up with your Primary Care Provider.  If you are age 2 or younger, your body mass index should be between 19-25. Your There is no height or weight on file to calculate BMI. If this is out of the aformentioned range listed, please consider follow up with your Primary Care Provider.   We have sent the following medications to your pharmacy for you to pick up at your convenience: Protonix  Call with an update in three weeks.  Thank you for choosing me and [facility].   Tye Savoy, NP
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
## 2019-08-05 ENCOUNTER — Encounter: Payer: Self-pay | Admitting: Nurse Practitioner

## 2019-08-07 NOTE — Progress Notes (Signed)
Reviewed and agree with management plan.  Carliyah Cotterman T. Josehua Hammar, MD FACG 

## 2019-08-09 ENCOUNTER — Other Ambulatory Visit: Payer: Self-pay

## 2019-08-09 ENCOUNTER — Encounter (HOSPITAL_COMMUNITY): Payer: Self-pay | Admitting: Emergency Medicine

## 2019-08-09 ENCOUNTER — Emergency Department (HOSPITAL_COMMUNITY): Payer: Medicare Other

## 2019-08-09 ENCOUNTER — Emergency Department (HOSPITAL_COMMUNITY)
Admission: EM | Admit: 2019-08-09 | Discharge: 2019-08-10 | Disposition: A | Discharge status: Home / Self Care | Payer: Medicare Other | Attending: Emergency Medicine | Admitting: Emergency Medicine

## 2019-08-09 DIAGNOSIS — M549 Dorsalgia, unspecified: Secondary | ICD-10-CM | POA: Diagnosis present

## 2019-08-09 DIAGNOSIS — J45909 Unspecified asthma, uncomplicated: Secondary | ICD-10-CM | POA: Diagnosis not present

## 2019-08-09 DIAGNOSIS — M545 Low back pain: Secondary | ICD-10-CM | POA: Diagnosis not present

## 2019-08-09 DIAGNOSIS — Z79899 Other long term (current) drug therapy: Secondary | ICD-10-CM | POA: Diagnosis not present

## 2019-08-09 DIAGNOSIS — M546 Pain in thoracic spine: Secondary | ICD-10-CM | POA: Diagnosis not present

## 2019-08-09 LAB — CBC WITH DIFFERENTIAL/PLATELET
Abs Immature Granulocytes: 0.02 10*3/uL (ref 0.00–0.07)
Basophils Absolute: 0.1 10*3/uL (ref 0.0–0.1)
Basophils Relative: 1 %
Eosinophils Absolute: 0.2 10*3/uL (ref 0.0–0.5)
Eosinophils Relative: 3 %
HCT: 39.6 % (ref 36.0–46.0)
Hemoglobin: 12.3 g/dL (ref 12.0–15.0)
Immature Granulocytes: 0 %
Lymphocytes Relative: 33 %
Lymphs Abs: 2.4 10*3/uL (ref 0.7–4.0)
MCH: 26.6 pg (ref 26.0–34.0)
MCHC: 31.1 g/dL (ref 30.0–36.0)
MCV: 85.5 fL (ref 80.0–100.0)
Monocytes Absolute: 0.5 10*3/uL (ref 0.1–1.0)
Monocytes Relative: 7 %
Neutro Abs: 4 10*3/uL (ref 1.7–7.7)
Neutrophils Relative %: 56 %
Platelets: 174 10*3/uL (ref 150–400)
RBC: 4.63 MIL/uL (ref 3.87–5.11)
RDW: 14.9 % (ref 11.5–15.5)
WBC: 7.2 10*3/uL (ref 4.0–10.5)
nRBC: 0 % (ref 0.0–0.2)

## 2019-08-09 MED ORDER — SODIUM CHLORIDE 0.9 % IV BOLUS
500.0000 mL | Freq: Once | INTRAVENOUS | Status: AC
  Administered 2019-08-09: 500 mL via INTRAVENOUS

## 2019-08-09 MED ORDER — HYDROMORPHONE HCL 1 MG/ML IJ SOLN
1.0000 mg | Freq: Once | INTRAMUSCULAR | Status: AC
  Administered 2019-08-09: 1 mg via INTRAVENOUS
  Filled 2019-08-09 (×2): qty 1

## 2019-08-09 NOTE — ED Notes (Signed)
Unable to obtain iv site and labs at this time IV team consult placed

## 2019-08-09 NOTE — ED Notes (Signed)
Pt stated she has a history of low blood pressure. RN made aware

## 2019-08-09 NOTE — ED Triage Notes (Signed)
Pt from home brought ED for back pain x 3 days states it is pain in her spinal cord . Hx quadripegic d/t spinal cord injury.
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
## 2019-08-10 ENCOUNTER — Other Ambulatory Visit: Payer: Self-pay

## 2019-08-10 DIAGNOSIS — M549 Dorsalgia, unspecified: Secondary | ICD-10-CM | POA: Diagnosis not present

## 2019-08-10 LAB — COMPREHENSIVE METABOLIC PANEL
ALT: 12 U/L (ref 0–44)
AST: 15 U/L (ref 15–41)
Albumin: 3.7 g/dL (ref 3.5–5.0)
Alkaline Phosphatase: 52 U/L (ref 38–126)
Anion gap: 9 (ref 5–15)
BUN: 14 mg/dL (ref 6–20)
CO2: 24 mmol/L (ref 22–32)
Calcium: 8.5 mg/dL — ABNORMAL LOW (ref 8.9–10.3)
Chloride: 102 mmol/L (ref 98–111)
Creatinine, Ser: 0.55 mg/dL (ref 0.44–1.00)
GFR calc Af Amer: >60 mL/min (ref >60)
GFR calc non Af Amer: >60 mL/min (ref >60)
Glucose, Bld: 86 mg/dL (ref 70–99)
Potassium: 4 mmol/L (ref 3.5–5.1)
Sodium: 135 mmol/L (ref 135–145)
Total Bilirubin: 0.4 mg/dL (ref 0.3–1.2)
Total Protein: 7.8 g/dL (ref 6.5–8.1)

## 2019-08-10 LAB — URINALYSIS, ROUTINE W REFLEX MICROSCOPIC
Bilirubin Urine: NEGATIVE
Glucose, UA: NEGATIVE mg/dL
Ketones, ur: NEGATIVE mg/dL
Nitrite: NEGATIVE
Protein, ur: 30 mg/dL — AB
Specific Gravity, Urine: 1.011 (ref 1.005–1.030)
WBC, UA: >50 WBC/hpf — ABNORMAL HIGH (ref 0–5)
pH: 6 (ref 5.0–8.0)

## 2019-08-10 LAB — PREGNANCY, URINE: Preg Test, Ur: NEGATIVE

## 2019-08-10 MED ORDER — HYDROMORPHONE HCL 2 MG PO TABS
4.0000 mg | ORAL_TABLET | Freq: Once | ORAL | Status: AC
  Administered 2019-08-10: 04:00:00 4 mg via ORAL
  Filled 2019-08-10: qty 2

## 2019-08-10 NOTE — ED Notes (Signed)
Called significant other to let him she is being discharged and brought home.

## 2019-08-10 NOTE — ED Provider Notes (Signed)
I assumed care of this patient from Dr. Francia Greaves.  Please see their note for further details of Hx, PE.  Briefly patient is a 33 y.o. female who presented with back pain. H/o quadriplegia. No trauma. Currently being treated for UTI.    Current plan is to follow up labs and CXR.  No leukocytosis. CXR negative. UA improving.  The patient is safe for discharge with strict return precautions.   The patient appears reasonably screened and/or stabilized for discharge and I doubt any other medical condition or other [facility] requiring further screening, evaluation, or treatment in the ED at this time prior to discharge.  Disposition: Discharge  Condition: Good  I have discussed the results, Dx and Tx plan with the patient who expressed understanding and agree(s) with the plan. Discharge instructions discussed at great length. The patient was given strict return precautions who verbalized understanding of the instructions. No further questions at time of discharge.    ED Discharge Orders    None       Follow Up: Center, [facility] [address] [place] [address] [phone]  Schedule an appointment as soon as possible for a visit  If symptoms do not improve or  worsen   .    Fatima Blank, MD 08/10/19 [phone]

## 2019-08-22 ENCOUNTER — Telehealth: Payer: Self-pay | Admitting: Neurology

## 2019-08-22 ENCOUNTER — Encounter: Payer: Self-pay | Admitting: Neurology

## 2019-08-22 ENCOUNTER — Ambulatory Visit: Payer: Medicare Other | Admitting: Neurology

## 2019-08-22 NOTE — Telephone Encounter (Signed)
This patient canceled the same day as a revisit appointment today. 

## 2019-08-30 ENCOUNTER — Emergency Department (HOSPITAL_COMMUNITY)
Admission: EM | Admit: 2019-08-30 | Discharge: 2019-08-31 | Disposition: A | Discharge status: Home / Self Care | Payer: Medicare Other | Attending: Emergency Medicine | Admitting: Emergency Medicine

## 2019-08-30 ENCOUNTER — Encounter (HOSPITAL_COMMUNITY): Payer: Self-pay

## 2019-08-30 DIAGNOSIS — J45909 Unspecified asthma, uncomplicated: Secondary | ICD-10-CM | POA: Diagnosis not present

## 2019-08-30 DIAGNOSIS — Q631 Lobulated, fused and horseshoe kidney: Secondary | ICD-10-CM | POA: Insufficient documentation

## 2019-08-30 DIAGNOSIS — R109 Unspecified abdominal pain: Secondary | ICD-10-CM | POA: Insufficient documentation

## 2019-08-30 DIAGNOSIS — Z79899 Other long term (current) drug therapy: Secondary | ICD-10-CM | POA: Diagnosis not present

## 2019-08-30 DIAGNOSIS — Z960 Presence of urogenital implants: Secondary | ICD-10-CM | POA: Insufficient documentation

## 2019-08-30 DIAGNOSIS — G8250 Quadriplegia, unspecified: Secondary | ICD-10-CM | POA: Insufficient documentation

## 2019-08-30 DIAGNOSIS — Z20828 Contact with and (suspected) exposure to other viral communicable diseases: Secondary | ICD-10-CM | POA: Diagnosis not present

## 2019-08-30 MED ORDER — ONDANSETRON HCL 4 MG/2ML IJ SOLN
4.0000 mg | Freq: Once | INTRAMUSCULAR | Status: AC
  Administered 2019-08-31: 4 mg via INTRAVENOUS
  Filled 2019-08-30: qty 2

## 2019-08-30 MED ORDER — FENTANYL CITRATE (PF) 100 MCG/2ML IJ SOLN
100.0000 ug | Freq: Once | INTRAMUSCULAR | Status: AC
  Administered 2019-08-31: 03:00:00 100 ug via INTRAVENOUS
  Filled 2019-08-30: qty 2
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
## 2019-08-30 NOTE — ED Triage Notes (Signed)
Pt is on her second antibiotics for a kidney infection and she doesn't feel any better, pt is a quadraplegic

## 2019-08-30 NOTE — ED Notes (Signed)
Pt called out saying she would like to go home. Pt is requesting [facility] for transport home. Delsa Sale, Agricultural consultant notified and is speaking with pt.

## 2019-08-31 ENCOUNTER — Emergency Department (HOSPITAL_COMMUNITY): Payer: Medicare Other

## 2019-08-31 ENCOUNTER — Encounter (HOSPITAL_BASED_OUTPATIENT_CLINIC_OR_DEPARTMENT_OTHER): Payer: Medicare Other

## 2019-08-31 ENCOUNTER — Other Ambulatory Visit: Payer: Self-pay

## 2019-08-31 DIAGNOSIS — R109 Unspecified abdominal pain: Secondary | ICD-10-CM | POA: Diagnosis not present

## 2019-08-31 LAB — BASIC METABOLIC PANEL
Anion gap: 10 (ref 5–15)
BUN: 10 mg/dL (ref 6–20)
CO2: 26 mmol/L (ref 22–32)
Calcium: 8.9 mg/dL (ref 8.9–10.3)
Chloride: 101 mmol/L (ref 98–111)
Creatinine, Ser: 0.47 mg/dL (ref 0.44–1.00)
GFR calc Af Amer: >60 mL/min (ref >60)
GFR calc non Af Amer: >60 mL/min (ref >60)
Glucose, Bld: 88 mg/dL (ref 70–99)
Potassium: 3.6 mmol/L (ref 3.5–5.1)
Sodium: 137 mmol/L (ref 135–145)

## 2019-08-31 LAB — CBC WITH DIFFERENTIAL/PLATELET
Abs Immature Granulocytes: 0 10*3/uL (ref 0.00–0.07)
Basophils Absolute: 0 10*3/uL (ref 0.0–0.1)
Basophils Relative: 1 %
Eosinophils Absolute: 0.1 10*3/uL (ref 0.0–0.5)
Eosinophils Relative: 4 %
HCT: 37.6 % (ref 36.0–46.0)
Hemoglobin: 11.6 g/dL — ABNORMAL LOW (ref 12.0–15.0)
Immature Granulocytes: 0 %
Lymphocytes Relative: 35 %
Lymphs Abs: 1.2 10*3/uL (ref 0.7–4.0)
MCH: 26 pg (ref 26.0–34.0)
MCHC: 30.9 g/dL (ref 30.0–36.0)
MCV: 84.3 fL (ref 80.0–100.0)
Monocytes Absolute: 0.5 10*3/uL (ref 0.1–1.0)
Monocytes Relative: 14 %
Neutro Abs: 1.6 10*3/uL — ABNORMAL LOW (ref 1.7–7.7)
Neutrophils Relative %: 46 %
Platelets: 188 10*3/uL (ref 150–400)
RBC: 4.46 MIL/uL (ref 3.87–5.11)
RDW: 15.8 % — ABNORMAL HIGH (ref 11.5–15.5)
WBC: 3.3 10*3/uL — ABNORMAL LOW (ref 4.0–10.5)
nRBC: 0 % (ref 0.0–0.2)

## 2019-08-31 LAB — URINALYSIS, ROUTINE W REFLEX MICROSCOPIC
Bilirubin Urine: NEGATIVE
Glucose, UA: NEGATIVE mg/dL
Ketones, ur: NEGATIVE mg/dL
Nitrite: NEGATIVE
Protein, ur: NEGATIVE mg/dL
Specific Gravity, Urine: 1.009 (ref 1.005–1.030)
pH: 6 (ref 5.0–8.0)

## 2019-08-31 LAB — URINE CULTURE

## 2019-08-31 LAB — PREGNANCY, URINE: Preg Test, Ur: NEGATIVE

## 2019-08-31 LAB — SARS CORONAVIRUS 2 (TAT 6-24 HRS): SARS Coronavirus 2: NEGATIVE

## 2019-08-31 MED ORDER — FENTANYL CITRATE (PF) 100 MCG/2ML IJ SOLN
100.0000 ug | Freq: Once | INTRAMUSCULAR | Status: AC
  Administered 2019-08-31: 100 ug via INTRAVENOUS
  Filled 2019-08-31: qty 2

## 2019-08-31 MED ORDER — ONDANSETRON 8 MG PO TBDP: 8.0000 mg | ORAL_TABLET | Freq: Three times a day (TID) | ORAL | 0 refills | Status: DC | PRN

## 2019-08-31 NOTE — ED Notes (Signed)
Unable to obtain IV access. Will put in IV team consult for access.

## 2019-08-31 NOTE — ED Notes (Signed)
Patient transported to radiology

## 2019-08-31 NOTE — ED Notes (Signed)
IV team at bedside 

## 2019-08-31 NOTE — ED Notes (Signed)
[facility] contacted for transport. Paperwork printed and at nursing station.  

## 2019-09-03 ENCOUNTER — Other Ambulatory Visit: Payer: Self-pay

## 2019-09-03 ENCOUNTER — Emergency Department (HOSPITAL_COMMUNITY)
Admission: EM | Admit: 2019-09-03 | Discharge: 2019-09-04 | Disposition: A | Discharge status: Home / Self Care | Payer: Medicare Other | Attending: Emergency Medicine | Admitting: Emergency Medicine

## 2019-09-03 ENCOUNTER — Telehealth: Payer: Self-pay | Admitting: *Deleted

## 2019-09-03 ENCOUNTER — Encounter (HOSPITAL_COMMUNITY): Payer: Self-pay | Admitting: Emergency Medicine

## 2019-09-03 DIAGNOSIS — R05 Cough: Secondary | ICD-10-CM | POA: Insufficient documentation

## 2019-09-03 DIAGNOSIS — R509 Fever, unspecified: Secondary | ICD-10-CM | POA: Insufficient documentation

## 2019-09-03 DIAGNOSIS — R059 Cough, unspecified: Secondary | ICD-10-CM

## 2019-09-03 DIAGNOSIS — Z79899 Other long term (current) drug therapy: Secondary | ICD-10-CM | POA: Insufficient documentation

## 2019-09-03 DIAGNOSIS — J45909 Unspecified asthma, uncomplicated: Secondary | ICD-10-CM | POA: Insufficient documentation

## 2019-09-03 NOTE — ED Triage Notes (Signed)
Pt reports productive cough, fever, chills, decreased appetite and SHOB. Reports negative COVID test 4 days ago. Pt recently treated for kidney infection.
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
## 2019-09-04 ENCOUNTER — Emergency Department (HOSPITAL_COMMUNITY): Payer: Medicare Other

## 2019-09-04 DIAGNOSIS — R05 Cough: Secondary | ICD-10-CM | POA: Diagnosis not present

## 2019-09-04 MED ORDER — ONDANSETRON 4 MG PO TBDP
4.0000 mg | ORAL_TABLET | Freq: Once | ORAL | Status: AC
  Administered 2019-09-04: 4 mg via ORAL
  Filled 2019-09-04: qty 1

## 2019-09-04 MED ORDER — HYDROMORPHONE HCL 1 MG/ML IJ SOLN
1.0000 mg | Freq: Once | INTRAMUSCULAR | Status: AC
  Administered 2019-09-04: 1 mg via INTRAMUSCULAR
  Filled 2019-09-04: qty 1

## 2019-09-04 MED ORDER — DIAZEPAM 5 MG PO TABS
5.0000 mg | ORAL_TABLET | Freq: Once | ORAL | Status: AC
  Administered 2019-09-04: 04:00:00 5 mg via ORAL
  Filled 2019-09-04: qty 1

## 2019-09-04 MED ORDER — BENZONATATE 100 MG PO CAPS: 100.0000 mg | ORAL_CAPSULE | Freq: Three times a day (TID) | ORAL | 0 refills | Status: DC

## 2019-09-04 MED ORDER — BENZONATATE 100 MG PO CAPS
100.0000 mg | ORAL_CAPSULE | Freq: Once | ORAL | Status: AC
  Administered 2019-09-04: 100 mg via ORAL
  Filled 2019-09-04: qty 1
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
## 2019-09-04 NOTE — ED Notes (Signed)
Pt transported to Xray. 

## 2019-09-07 ENCOUNTER — Encounter (HOSPITAL_COMMUNITY): Payer: Self-pay | Admitting: Emergency Medicine

## 2019-09-07 ENCOUNTER — Other Ambulatory Visit: Payer: Self-pay

## 2019-09-07 ENCOUNTER — Emergency Department (HOSPITAL_COMMUNITY): Payer: Medicare Other

## 2019-09-07 ENCOUNTER — Encounter (HOSPITAL_BASED_OUTPATIENT_CLINIC_OR_DEPARTMENT_OTHER): Payer: Medicare Other | Attending: Physician Assistant

## 2019-09-07 ENCOUNTER — Inpatient Hospital Stay (HOSPITAL_COMMUNITY)
Admission: EM | Admit: 2019-09-07 | Discharge: 2019-09-15 | DRG: 177 | Disposition: A | Discharge status: Home Health | Payer: Medicare Other | Attending: Internal Medicine | Admitting: Internal Medicine

## 2019-09-07 DIAGNOSIS — F419 Anxiety disorder, unspecified: Secondary | ICD-10-CM | POA: Diagnosis present

## 2019-09-07 DIAGNOSIS — H6063 Unspecified chronic otitis externa, bilateral: Secondary | ICD-10-CM | POA: Diagnosis present

## 2019-09-07 DIAGNOSIS — G8929 Other chronic pain: Secondary | ICD-10-CM | POA: Diagnosis present

## 2019-09-07 DIAGNOSIS — Z960 Presence of urogenital implants: Secondary | ICD-10-CM | POA: Diagnosis present

## 2019-09-07 DIAGNOSIS — Z981 Arthrodesis status: Secondary | ICD-10-CM

## 2019-09-07 DIAGNOSIS — J9601 Acute respiratory failure with hypoxia: Secondary | ICD-10-CM | POA: Diagnosis present

## 2019-09-07 DIAGNOSIS — J028 Acute pharyngitis due to other specified organisms: Secondary | ICD-10-CM | POA: Diagnosis present

## 2019-09-07 DIAGNOSIS — Z87440 Personal history of urinary (tract) infections: Secondary | ICD-10-CM

## 2019-09-07 DIAGNOSIS — D61818 Other pancytopenia: Secondary | ICD-10-CM | POA: Diagnosis present

## 2019-09-07 DIAGNOSIS — L89313 Pressure ulcer of right buttock, stage 3: Secondary | ICD-10-CM | POA: Diagnosis present

## 2019-09-07 DIAGNOSIS — Z888 Allergy status to other drugs, medicaments and biological substances status: Secondary | ICD-10-CM

## 2019-09-07 DIAGNOSIS — F339 Major depressive disorder, recurrent, unspecified: Secondary | ICD-10-CM | POA: Diagnosis present

## 2019-09-07 DIAGNOSIS — Z793 Long term (current) use of hormonal contraceptives: Secondary | ICD-10-CM

## 2019-09-07 DIAGNOSIS — N390 Urinary tract infection, site not specified: Secondary | ICD-10-CM | POA: Diagnosis present

## 2019-09-07 DIAGNOSIS — U071 COVID-19: Secondary | ICD-10-CM | POA: Diagnosis not present

## 2019-09-07 DIAGNOSIS — Z95828 Presence of other vascular implants and grafts: Secondary | ICD-10-CM

## 2019-09-07 DIAGNOSIS — J45909 Unspecified asthma, uncomplicated: Secondary | ICD-10-CM | POA: Diagnosis present

## 2019-09-07 DIAGNOSIS — R0902 Hypoxemia: Secondary | ICD-10-CM

## 2019-09-07 DIAGNOSIS — N319 Neuromuscular dysfunction of bladder, unspecified: Secondary | ICD-10-CM | POA: Diagnosis present

## 2019-09-07 DIAGNOSIS — R0781 Pleurodynia: Secondary | ICD-10-CM

## 2019-09-07 DIAGNOSIS — Z79891 Long term (current) use of opiate analgesic: Secondary | ICD-10-CM

## 2019-09-07 DIAGNOSIS — G8250 Quadriplegia, unspecified: Secondary | ICD-10-CM | POA: Diagnosis present

## 2019-09-07 DIAGNOSIS — L89323 Pressure ulcer of left buttock, stage 3: Secondary | ICD-10-CM | POA: Diagnosis present

## 2019-09-07 DIAGNOSIS — Z9359 Other cystostomy status: Secondary | ICD-10-CM

## 2019-09-07 DIAGNOSIS — R0600 Dyspnea, unspecified: Secondary | ICD-10-CM

## 2019-09-07 DIAGNOSIS — R0602 Shortness of breath: Secondary | ICD-10-CM

## 2019-09-07 DIAGNOSIS — G8254 Quadriplegia, C5-C7 incomplete: Secondary | ICD-10-CM | POA: Diagnosis present

## 2019-09-07 DIAGNOSIS — Z79899 Other long term (current) drug therapy: Secondary | ICD-10-CM

## 2019-09-07 DIAGNOSIS — J1289 Other viral pneumonia: Secondary | ICD-10-CM | POA: Diagnosis present

## 2019-09-07 DIAGNOSIS — Z885 Allergy status to narcotic agent status: Secondary | ICD-10-CM

## 2019-09-07 DIAGNOSIS — Z993 Dependence on wheelchair: Secondary | ICD-10-CM

## 2019-09-07 DIAGNOSIS — J029 Acute pharyngitis, unspecified: Secondary | ICD-10-CM

## 2019-09-07 DIAGNOSIS — L8944 Pressure ulcer of contiguous site of back, buttock and hip, stage 4: Secondary | ICD-10-CM | POA: Diagnosis present

## 2019-09-07 DIAGNOSIS — M4302 Spondylolysis, cervical region: Secondary | ICD-10-CM | POA: Diagnosis present

## 2019-09-07 DIAGNOSIS — L89151 Pressure ulcer of sacral region, stage 1: Secondary | ICD-10-CM | POA: Diagnosis present

## 2019-09-07 DIAGNOSIS — Q600 Renal agenesis, unilateral: Secondary | ICD-10-CM

## 2019-09-07 DIAGNOSIS — Z880 Allergy status to penicillin: Secondary | ICD-10-CM

## 2019-09-07 DIAGNOSIS — E282 Polycystic ovarian syndrome: Secondary | ICD-10-CM | POA: Diagnosis present

## 2019-09-07 LAB — CBC WITH DIFFERENTIAL/PLATELET
Abs Immature Granulocytes: 0.01 10*3/uL (ref 0.00–0.07)
Basophils Absolute: 0 10*3/uL (ref 0.0–0.1)
Basophils Relative: 0 %
Eosinophils Absolute: 0 10*3/uL (ref 0.0–0.5)
Eosinophils Relative: 0 %
HCT: 39.3 % (ref 36.0–46.0)
Hemoglobin: 11.9 g/dL — ABNORMAL LOW (ref 12.0–15.0)
Immature Granulocytes: 1 %
Lymphocytes Relative: 30 %
Lymphs Abs: 0.6 10*3/uL — ABNORMAL LOW (ref 0.7–4.0)
MCH: 25.3 pg — ABNORMAL LOW (ref 26.0–34.0)
MCHC: 30.3 g/dL (ref 30.0–36.0)
MCV: 83.6 fL (ref 80.0–100.0)
Monocytes Absolute: 0.2 10*3/uL (ref 0.1–1.0)
Monocytes Relative: 7 %
Neutro Abs: 1.3 10*3/uL — ABNORMAL LOW (ref 1.7–7.7)
Neutrophils Relative %: 62 %
Platelets: 105 10*3/uL — ABNORMAL LOW (ref 150–400)
RBC: 4.7 MIL/uL (ref 3.87–5.11)
RDW: 15.6 % — ABNORMAL HIGH (ref 11.5–15.5)
WBC: 2 10*3/uL — ABNORMAL LOW (ref 4.0–10.5)
nRBC: 0 % (ref 0.0–0.2)

## 2019-09-07 LAB — COMPREHENSIVE METABOLIC PANEL
ALT: 18 U/L (ref 0–44)
AST: 34 U/L (ref 15–41)
Albumin: 3.3 g/dL — ABNORMAL LOW (ref 3.5–5.0)
Alkaline Phosphatase: 64 U/L (ref 38–126)
Anion gap: 10 (ref 5–15)
BUN: 5 mg/dL — ABNORMAL LOW (ref 6–20)
CO2: 28 mmol/L (ref 22–32)
Calcium: 7.8 mg/dL — ABNORMAL LOW (ref 8.9–10.3)
Chloride: 98 mmol/L (ref 98–111)
Creatinine, Ser: 0.5 mg/dL (ref 0.44–1.00)
GFR calc Af Amer: >60 mL/min (ref >60)
GFR calc non Af Amer: >60 mL/min (ref >60)
Glucose, Bld: 139 mg/dL — ABNORMAL HIGH (ref 70–99)
Potassium: 3.8 mmol/L (ref 3.5–5.1)
Sodium: 136 mmol/L (ref 135–145)
Total Bilirubin: 0.4 mg/dL (ref 0.3–1.2)
Total Protein: 7.4 g/dL (ref 6.5–8.1)

## 2019-09-07 LAB — D-DIMER, QUANTITATIVE: D-Dimer, Quant: 1.52 ug/mL-FEU — ABNORMAL HIGH (ref 0.00–0.50)

## 2019-09-07 LAB — POC URINE PREG, ED: Preg Test, Ur: NEGATIVE

## 2019-09-07 MED ORDER — IPRATROPIUM BROMIDE HFA 17 MCG/ACT IN AERS
2.0000 | INHALATION_SPRAY | Freq: Once | RESPIRATORY_TRACT | Status: AC
  Administered 2019-09-07: 2 via RESPIRATORY_TRACT
  Filled 2019-09-07: qty 12.9

## 2019-09-07 MED ORDER — HYDROMORPHONE HCL 1 MG/ML IJ SOLN
0.5000 mg | Freq: Once | INTRAMUSCULAR | Status: AC
  Administered 2019-09-07: 0.5 mg via INTRAVENOUS
  Filled 2019-09-07: qty 1

## 2019-09-07 MED ORDER — ONDANSETRON HCL 4 MG/2ML IJ SOLN
4.0000 mg | Freq: Once | INTRAMUSCULAR | Status: AC
  Administered 2019-09-07: 4 mg via INTRAVENOUS
  Filled 2019-09-07: qty 2

## 2019-09-07 MED ORDER — ALBUTEROL SULFATE HFA 108 (90 BASE) MCG/ACT IN AERS
2.0000 | INHALATION_SPRAY | Freq: Once | RESPIRATORY_TRACT | Status: AC
  Administered 2019-09-07: 2 via RESPIRATORY_TRACT
  Filled 2019-09-07: qty 6.7

## 2019-09-07 MED ORDER — SODIUM CHLORIDE 0.9 % IV BOLUS
500.0000 mL | Freq: Once | INTRAVENOUS | Status: AC
  Administered 2019-09-07: 500 mL via INTRAVENOUS

## 2019-09-07 MED ORDER — ONDANSETRON HCL 4 MG/2ML IJ SOLN: 4.0000 mg | Freq: Once | INTRAMUSCULAR | Status: DC

## 2019-09-07 NOTE — ED Notes (Signed)
PA made aware waiting on IV team to obtain access.

## 2019-09-07 NOTE — ED Notes (Signed)
IV team at bedside 

## 2019-09-07 NOTE — ED Triage Notes (Addendum)
BIB EMS from home.  Complains of weakness cough, fever, sore throat. Tested negative for Covid a week ago. Seen for same 4 days ago.   Temp 101.7 1000mg  Tylenol
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
## 2019-09-08 ENCOUNTER — Inpatient Hospital Stay (HOSPITAL_COMMUNITY): Payer: Medicare Other

## 2019-09-08 DIAGNOSIS — J45909 Unspecified asthma, uncomplicated: Secondary | ICD-10-CM | POA: Diagnosis present

## 2019-09-08 DIAGNOSIS — E282 Polycystic ovarian syndrome: Secondary | ICD-10-CM | POA: Diagnosis present

## 2019-09-08 DIAGNOSIS — N390 Urinary tract infection, site not specified: Secondary | ICD-10-CM | POA: Diagnosis present

## 2019-09-08 DIAGNOSIS — Z993 Dependence on wheelchair: Secondary | ICD-10-CM | POA: Diagnosis not present

## 2019-09-08 DIAGNOSIS — N319 Neuromuscular dysfunction of bladder, unspecified: Secondary | ICD-10-CM | POA: Diagnosis present

## 2019-09-08 DIAGNOSIS — Z87440 Personal history of urinary (tract) infections: Secondary | ICD-10-CM | POA: Diagnosis not present

## 2019-09-08 DIAGNOSIS — L89323 Pressure ulcer of left buttock, stage 3: Secondary | ICD-10-CM | POA: Diagnosis present

## 2019-09-08 DIAGNOSIS — J028 Acute pharyngitis due to other specified organisms: Secondary | ICD-10-CM | POA: Diagnosis present

## 2019-09-08 DIAGNOSIS — J029 Acute pharyngitis, unspecified: Secondary | ICD-10-CM | POA: Diagnosis present

## 2019-09-08 DIAGNOSIS — H6063 Unspecified chronic otitis externa, bilateral: Secondary | ICD-10-CM | POA: Diagnosis present

## 2019-09-08 DIAGNOSIS — Z95828 Presence of other vascular implants and grafts: Secondary | ICD-10-CM | POA: Diagnosis not present

## 2019-09-08 DIAGNOSIS — G8929 Other chronic pain: Secondary | ICD-10-CM | POA: Diagnosis present

## 2019-09-08 DIAGNOSIS — F419 Anxiety disorder, unspecified: Secondary | ICD-10-CM | POA: Diagnosis present

## 2019-09-08 DIAGNOSIS — Z793 Long term (current) use of hormonal contraceptives: Secondary | ICD-10-CM | POA: Diagnosis not present

## 2019-09-08 DIAGNOSIS — J9601 Acute respiratory failure with hypoxia: Secondary | ICD-10-CM | POA: Diagnosis present

## 2019-09-08 DIAGNOSIS — Z960 Presence of urogenital implants: Secondary | ICD-10-CM | POA: Diagnosis present

## 2019-09-08 DIAGNOSIS — F339 Major depressive disorder, recurrent, unspecified: Secondary | ICD-10-CM | POA: Diagnosis present

## 2019-09-08 DIAGNOSIS — L89313 Pressure ulcer of right buttock, stage 3: Secondary | ICD-10-CM | POA: Diagnosis present

## 2019-09-08 DIAGNOSIS — U071 COVID-19: Principal | ICD-10-CM | POA: Diagnosis present

## 2019-09-08 DIAGNOSIS — Z9359 Other cystostomy status: Secondary | ICD-10-CM | POA: Diagnosis not present

## 2019-09-08 DIAGNOSIS — G8254 Quadriplegia, C5-C7 incomplete: Secondary | ICD-10-CM | POA: Diagnosis present

## 2019-09-08 DIAGNOSIS — M4302 Spondylolysis, cervical region: Secondary | ICD-10-CM | POA: Diagnosis present

## 2019-09-08 DIAGNOSIS — Z981 Arthrodesis status: Secondary | ICD-10-CM | POA: Diagnosis not present

## 2019-09-08 DIAGNOSIS — J1289 Other viral pneumonia: Secondary | ICD-10-CM | POA: Diagnosis present

## 2019-09-08 DIAGNOSIS — D61818 Other pancytopenia: Secondary | ICD-10-CM | POA: Diagnosis present

## 2019-09-08 DIAGNOSIS — G8250 Quadriplegia, unspecified: Secondary | ICD-10-CM | POA: Diagnosis not present

## 2019-09-08 DIAGNOSIS — R0781 Pleurodynia: Secondary | ICD-10-CM | POA: Diagnosis present

## 2019-09-08 DIAGNOSIS — Q600 Renal agenesis, unilateral: Secondary | ICD-10-CM | POA: Diagnosis not present

## 2019-09-08 LAB — PROCALCITONIN: Procalcitonin: 1.17 ng/mL

## 2019-09-08 LAB — ABO/RH: ABO/RH(D): A POS

## 2019-09-08 LAB — C-REACTIVE PROTEIN: CRP: 10.5 mg/dL — ABNORMAL HIGH (ref <1.0)

## 2019-09-08 LAB — SARS CORONAVIRUS 2 BY RT PCR (HOSPITAL ORDER, PERFORMED IN ~~LOC~~ HOSPITAL LAB): SARS Coronavirus 2: POSITIVE — AB

## 2019-09-08 MED ORDER — HYDROMORPHONE HCL 2 MG PO TABS
4.0000 mg | ORAL_TABLET | Freq: Four times a day (QID) | ORAL | Status: DC | PRN
  Administered 2019-09-08 – 2019-09-09 (×4): 4 mg via ORAL
  Filled 2019-09-08 (×4): qty 2

## 2019-09-08 MED ORDER — ALBUTEROL SULFATE HFA 108 (90 BASE) MCG/ACT IN AERS
2.0000 | INHALATION_SPRAY | RESPIRATORY_TRACT | Status: DC | PRN
  Administered 2019-09-08 – 2019-09-11 (×6): 2 via RESPIRATORY_TRACT
  Filled 2019-09-08: qty 6.7

## 2019-09-08 MED ORDER — DIAZEPAM 5 MG PO TABS
5.0000 mg | ORAL_TABLET | Freq: Three times a day (TID) | ORAL | Status: DC
  Administered 2019-09-08 – 2019-09-15 (×22): 5 mg via ORAL
  Filled 2019-09-08 (×22): qty 1

## 2019-09-08 MED ORDER — PREGABALIN 50 MG PO CAPS
75.0000 mg | ORAL_CAPSULE | Freq: Three times a day (TID) | ORAL | Status: DC
  Administered 2019-09-08 – 2019-09-15 (×22): 75 mg via ORAL
  Filled 2019-09-08 (×22): qty 1

## 2019-09-08 MED ORDER — ONDANSETRON HCL 4 MG/2ML IJ SOLN
4.0000 mg | Freq: Once | INTRAMUSCULAR | Status: AC
  Administered 2019-09-08: 4 mg via INTRAVENOUS
  Filled 2019-09-08: qty 2

## 2019-09-08 MED ORDER — ENOXAPARIN SODIUM 30 MG/0.3ML ~~LOC~~ SOLN
30.0000 mg | SUBCUTANEOUS | Status: DC
  Administered 2019-09-08: 30 mg via SUBCUTANEOUS
  Filled 2019-09-08: qty 0.3

## 2019-09-08 MED ORDER — PANTOPRAZOLE SODIUM 40 MG PO TBEC
40.0000 mg | DELAYED_RELEASE_TABLET | Freq: Every day | ORAL | Status: DC
  Administered 2019-09-08 – 2019-09-15 (×8): 40 mg via ORAL
  Filled 2019-09-08 (×8): qty 1

## 2019-09-08 MED ORDER — SODIUM CHLORIDE 0.9 % IV BOLUS: 500.0000 mL | Freq: Once | INTRAVENOUS | Status: DC

## 2019-09-08 MED ORDER — DARIFENACIN HYDROBROMIDE ER 15 MG PO TB24
15.0000 mg | ORAL_TABLET | Freq: Every day | ORAL | Status: DC
  Administered 2019-09-09 – 2019-09-15 (×7): 15 mg via ORAL
  Filled 2019-09-08 (×9): qty 1

## 2019-09-08 MED ORDER — DEXAMETHASONE SODIUM PHOSPHATE 10 MG/ML IJ SOLN
6.0000 mg | Freq: Once | INTRAMUSCULAR | Status: AC
  Administered 2019-09-08: 06:00:00 6 mg via INTRAVENOUS
  Filled 2019-09-08: qty 1

## 2019-09-08 MED ORDER — DEXAMETHASONE 6 MG PO TABS
6.0000 mg | ORAL_TABLET | ORAL | Status: DC
  Administered 2019-09-08 – 2019-09-15 (×8): 6 mg via ORAL
  Filled 2019-09-08 (×8): qty 1
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
## 2019-09-08 NOTE — ED Notes (Signed)
Lab called for blood draw due to pt being difficult stick and inability to draw labs off IV

## 2019-09-08 NOTE — ED Notes (Signed)
Pt said, "You can tell [name] anything you need about my medical care. He provides all of my care at home."

## 2019-09-08 NOTE — ED Notes (Signed)
[facility] called. 

## 2019-09-08 NOTE — ED Provider Notes (Signed)
Patient was seen in conjunction with myself and PA [place].  Refer to his note for full history and physical examination.  Briefly, patient is a 33 year old female with history of spastic tetraplegia secondary to cervical spine injury presenting for evaluation of shortness of breath, pleuritic chest pain, sore throat, sinus pressure.  Was seen and evaluated in the ED on 09/03/2019 for similar symptoms, tested negative for COVID and was sent home with [facility] which she reports she could not tolerate.  She states that sore throat is actually characteristic for her when she becomes sick but posterior pharynx shows only mild erythema, no tonsillar hypertrophy, exudates, uvular deviation, trismus or sublingual abnormalities.  She is tolerating secretions without difficulty.  Breath sounds clear to auscultation bilaterally however she is persistently tachycardic and at one point hypoxic to 89% on room air.  Placed on 2 L via nasal cannula supplemental oxygen with improvement after refusing O2 initially.  EKG shows sinus tachycardia, no acute ischemic abnormalities.  Her chest pain is quite atypical and I doubt ACS/MI.  Differential includes possible PE given her current vital signs, pleuritic chest pain, and sedentary status due to her tetraplegia.  Chest x-ray shows no acute cardiopulmonary abnormalities. Labwork shows stable leukopenia which could be in the setting of possible infection, stable anemia, no metabolic derangements, no renal insufficiency. D-Dimer elevated, but unfortunately a CTA cannot be obtained due to severely limited IV access; we are unable to obtain adequate IV gauge for CTA chest. She will require admission for V/Q scan to rule out PE in the setting of tachycardia, pleuritic chest pain, and hypoxia.  VQ scan is not currently available in the ED at this time. Of note, she does have an IVC filter placed. Spoke with Dr. Alcario Drought with [facility] who agrees to assume care of patient and  bring her into the hospital for further evaluation and management.   Debroah Baller 09/08/19 0129    Dorie Rank, MD 09/08/19 2035

## 2019-09-08 NOTE — ED Notes (Signed)
Pt said she needs to be disimpacted ASAP, her husband typically does this for her.

## 2019-09-09 DIAGNOSIS — G8250 Quadriplegia, unspecified: Secondary | ICD-10-CM

## 2019-09-09 DIAGNOSIS — N390 Urinary tract infection, site not specified: Secondary | ICD-10-CM

## 2019-09-09 DIAGNOSIS — J988 Other specified respiratory disorders: Secondary | ICD-10-CM

## 2019-09-09 DIAGNOSIS — Z9359 Other cystostomy status: Secondary | ICD-10-CM

## 2019-09-09 LAB — COMPREHENSIVE METABOLIC PANEL
ALT: 16 U/L (ref 0–44)
AST: 23 U/L (ref 15–41)
Albumin: 3.3 g/dL — ABNORMAL LOW (ref 3.5–5.0)
Alkaline Phosphatase: 60 U/L (ref 38–126)
Anion gap: 9 (ref 5–15)
BUN: 7 mg/dL (ref 6–20)
CO2: 28 mmol/L (ref 22–32)
Calcium: 8.1 mg/dL — ABNORMAL LOW (ref 8.9–10.3)
Chloride: 101 mmol/L (ref 98–111)
Creatinine, Ser: 0.41 mg/dL — ABNORMAL LOW (ref 0.44–1.00)
GFR calc Af Amer: >60 mL/min (ref >60)
GFR calc non Af Amer: >60 mL/min (ref >60)
Glucose, Bld: 138 mg/dL — ABNORMAL HIGH (ref 70–99)
Potassium: 4 mmol/L (ref 3.5–5.1)
Sodium: 138 mmol/L (ref 135–145)
Total Bilirubin: 0.6 mg/dL (ref 0.3–1.2)
Total Protein: 7.5 g/dL (ref 6.5–8.1)

## 2019-09-09 LAB — URINALYSIS, ROUTINE W REFLEX MICROSCOPIC
Bilirubin Urine: NEGATIVE
Glucose, UA: NEGATIVE mg/dL
Ketones, ur: NEGATIVE mg/dL
Nitrite: NEGATIVE
Protein, ur: 30 mg/dL — AB
Specific Gravity, Urine: 1.017 (ref 1.005–1.030)
pH: 6 (ref 5.0–8.0)

## 2019-09-09 LAB — FERRITIN: Ferritin: 22 ng/mL (ref 11–307)

## 2019-09-09 LAB — D-DIMER, QUANTITATIVE: D-Dimer, Quant: 0.9 ug/mL-FEU — ABNORMAL HIGH (ref 0.00–0.50)

## 2019-09-09 LAB — HIV ANTIBODY (ROUTINE TESTING W REFLEX): HIV Screen 4th Generation wRfx: NONREACTIVE

## 2019-09-09 LAB — C-REACTIVE PROTEIN: CRP: 8.5 mg/dL — ABNORMAL HIGH (ref <1.0)

## 2019-09-09 MED ORDER — HYDROMORPHONE HCL 2 MG PO TABS
4.0000 mg | ORAL_TABLET | Freq: Three times a day (TID) | ORAL | Status: DC
  Administered 2019-09-09 – 2019-09-15 (×18): 4 mg via ORAL
  Filled 2019-09-09 (×18): qty 2

## 2019-09-09 MED ORDER — ENOXAPARIN SODIUM 30 MG/0.3ML ~~LOC~~ SOLN
30.0000 mg | SUBCUTANEOUS | Status: DC
  Administered 2019-09-09 – 2019-09-14 (×6): 30 mg via SUBCUTANEOUS
  Filled 2019-09-09 (×6): qty 0.3

## 2019-09-09 MED ORDER — ONDANSETRON HCL 4 MG/2ML IJ SOLN
4.0000 mg | Freq: Four times a day (QID) | INTRAMUSCULAR | Status: DC | PRN
  Administered 2019-09-09 (×2): 4 mg via INTRAVENOUS
  Filled 2019-09-09 (×2): qty 2

## 2019-09-09 MED ORDER — SODIUM CHLORIDE 0.9 % IV SOLN
1.0000 g | INTRAVENOUS | Status: DC
  Administered 2019-09-09 – 2019-09-12 (×4): 1 g via INTRAVENOUS
  Filled 2019-09-09 (×4): qty 10

## 2019-09-09 MED ORDER — HYDROCERIN EX CREA
TOPICAL_CREAM | Freq: Two times a day (BID) | CUTANEOUS | Status: DC
  Administered 2019-09-09 – 2019-09-11 (×6): via TOPICAL
  Administered 2019-09-12: 1 via TOPICAL
  Administered 2019-09-12 – 2019-09-15 (×6): via TOPICAL
  Filled 2019-09-09: qty 113

## 2019-09-09 MED ORDER — IBUPROFEN 200 MG PO TABS
200.0000 mg | ORAL_TABLET | Freq: Every day | ORAL | Status: DC | PRN
  Administered 2019-09-09 – 2019-09-10 (×2): 200 mg via ORAL
  Filled 2019-09-09 (×4): qty 1

## 2019-09-09 NOTE — Progress Notes (Addendum)
Patient updated her spouse on the phone. Notified spouse of patient's vitals per her request.   15:00 patient refuses pillow support under bottom and feet. Patient has been educated about preventing further breakdown. Patient is adamant that she stick to her normal routine and states it is too painful to have pillows placed under her. Patient also refuses to have specialty mattress placed under her. Patient states she has had it in the hospital before it she cannot get any sleep and asked this writer to "put a not in that I refuse."

## 2019-09-09 NOTE — Plan of Care (Signed)
  Problem: Education: Goal: Knowledge of risk factors and measures for prevention of condition will improve Outcome: Progressing   Problem: Respiratory: Goal: Will maintain a patent airway Outcome: Progressing Goal: Complications related to the disease process, condition or treatment will be avoided or minimized Outcome: Progressing   Problem: Education: Goal: Knowledge of General Education information will improve Description: Including pain rating scale, medication(s)/side effects and non-pharmacologic comfort measures Outcome: Progressing   Problem: Health Behavior/Discharge Planning: Goal: Ability to manage health-related needs will improve Outcome: Progressing   Problem: Safety: Goal: Ability to remain free from injury will improve Outcome: Progressing   Problem: Skin Integrity: Goal: Risk for impaired skin integrity will decrease Outcome: Progressing
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
## 2019-09-09 NOTE — Plan of Care (Signed)
  Problem: Education: Goal: Knowledge of risk factors and measures for prevention of condition will improve 09/09/2019 0051 by Blinda Leatherwood, RN Outcome: Progressing 09/09/2019 0015 by Lysbeth Penner D, RN Outcome: Progressing   Problem: Coping: Goal: Psychosocial and spiritual needs will be supported 09/09/2019 0051 by Blinda Leatherwood, RN Outcome: Progressing 09/09/2019 0015 by Lysbeth Penner D, RN Outcome: Progressing   Problem: Respiratory: Goal: Will maintain a patent airway 09/09/2019 0051 by Blinda Leatherwood, RN Outcome: Progressing 09/09/2019 0015 by Blinda Leatherwood, RN Outcome: Progressing Goal: Complications related to the disease process, condition or treatment will be avoided or minimized 09/09/2019 0051 by Blinda Leatherwood, RN Outcome: Progressing 09/09/2019 0015 by Blinda Leatherwood, RN Outcome: Progressing   Problem: Education: Goal: Knowledge of General Education information will improve Description: Including pain rating scale, medication(s)/side effects and non-pharmacologic comfort measures 09/09/2019 0051 by Blinda Leatherwood, RN Outcome: Progressing 09/09/2019 0015 by Blinda Leatherwood, RN Outcome: Progressing   Problem: Health Behavior/Discharge Planning: Goal: Ability to manage health-related needs will improve 09/09/2019 0051 by Blinda Leatherwood, RN Outcome: Progressing 09/09/2019 0015 by Lysbeth Penner D, RN Outcome: Progressing   Problem: Safety: Goal: Ability to remain free from injury will improve 09/09/2019 0051 by Blinda Leatherwood, RN Outcome: Progressing 09/09/2019 0015 by Lysbeth Penner D, RN Outcome: Progressing   Problem: Skin Integrity: Goal: Risk for impaired skin integrity will decrease 09/09/2019 0051 by Lysbeth Penner D, RN Outcome: Progressing 09/09/2019 0015 by Blinda Leatherwood, RN Outcome: Progressing

## 2019-09-09 NOTE — Progress Notes (Signed)
Refused to allow wounds to be measured, pt stated during assessment she did not want to move r/t being comfortable. Pt stated the other nurse had measured her wounds. Pt noted she did not want to be awaken, if she is sleep during the night because has not been able to sleep. Pt consumed 45% of her meal, tolerated well. Pt can feed self, she will need assistance with opening containers and positioned. She was unable to grip due to her strength. Remain on room air.

## 2019-09-09 NOTE — Progress Notes (Addendum)
Patient refused vital signs during the night. This morning patient stated she did not want to have her dressing changed and would rather have it changed after she gets her morning pain medicine.  Will pass on to oncoming shift.  Updated Timothy, patient's significant other, on patient's current status.  Christia Reading is also a patient at [facility].  Patient continues to refuse preventive care for skin breakdown and comfort.  Education provided.  No acute distress present.  See chart for nsg assessment.
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
## 2019-09-10 LAB — CBC WITH DIFFERENTIAL/PLATELET
Abs Immature Granulocytes: 0.01 10*3/uL (ref 0.00–0.07)
Basophils Absolute: 0 10*3/uL (ref 0.0–0.1)
Basophils Relative: 0 %
Eosinophils Absolute: 0 10*3/uL (ref 0.0–0.5)
Eosinophils Relative: 0 %
HCT: 37.5 % (ref 36.0–46.0)
Hemoglobin: 11.3 g/dL — ABNORMAL LOW (ref 12.0–15.0)
Immature Granulocytes: 0 %
Lymphocytes Relative: 10 %
Lymphs Abs: 0.2 10*3/uL — ABNORMAL LOW (ref 0.7–4.0)
MCH: 25.2 pg — ABNORMAL LOW (ref 26.0–34.0)
MCHC: 30.1 g/dL (ref 30.0–36.0)
MCV: 83.5 fL (ref 80.0–100.0)
Monocytes Absolute: 0.1 10*3/uL (ref 0.1–1.0)
Monocytes Relative: 3 %
Neutro Abs: 2.1 10*3/uL (ref 1.7–7.7)
Neutrophils Relative %: 87 %
Platelets: 146 10*3/uL — ABNORMAL LOW (ref 150–400)
RBC: 4.49 MIL/uL (ref 3.87–5.11)
RDW: 16 % — ABNORMAL HIGH (ref 11.5–15.5)
WBC: 2.5 10*3/uL — ABNORMAL LOW (ref 4.0–10.5)
nRBC: 0 % (ref 0.0–0.2)

## 2019-09-10 LAB — D-DIMER, QUANTITATIVE: D-Dimer, Quant: 0.48 ug/mL-FEU (ref 0.00–0.50)

## 2019-09-10 LAB — C-REACTIVE PROTEIN: CRP: 6 mg/dL — ABNORMAL HIGH (ref <1.0)

## 2019-09-10 MED ORDER — GUAIFENESIN-DM 100-10 MG/5ML PO SYRP
5.0000 mL | ORAL_SOLUTION | ORAL | Status: DC | PRN
  Administered 2019-09-10: 5 mL via ORAL
  Filled 2019-09-10: qty 10

## 2019-09-10 NOTE — Progress Notes (Addendum)
Patient's husband [name] updated on patient's condition and plan of care.   15:38 Patient's O2 sats dropping to the high 80's staying between 87-90%. She denies any difficulty breathing. Pulse ox probe and location changed. Patient set up in the bed, IS given and patient assisted with completing 10 reps. PRN inhaler given and placed on 2 liters nasal cannula to sustain her over 90%. Dr Bonner Puna and patient's husband updated.
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
## 2019-09-10 NOTE — Progress Notes (Signed)
PROGRESS NOTE  [name]  ZOX:[id number] DOB: [birth year] DOA: 09/07/2019 PCP: [facility]   Brief Narrative: [name] is a 33 y.o. 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 With 1 week of progressive symptoms and onset of similar symptoms in her spouse/caretaker, they presented to the ED 9/16. She was reportedly hypoxic on room air without infiltrate on CXR and SAR-CoV-2 PCR was positive. Both she and her spouse were admitted to [facility] 9/17. Steroids started, though she exhibited no respiratory symptoms, so remdesivir not started.   Assessment & Plan: Principal Problem:   COVID-19 virus infection Active Problems:   Pressure injury of skin   Recurrent UTI   Chronic incomplete spastic tetraplegia (HCC)   Chronic suprapubic catheter (HCC)   Pharyngitis   COVID-19  Covid-19 infection: No hypoxia or respiratory complaints. Initial CXR with no infiltrate, some opacity on repeat CXR not consistent with multifocal PNA.  - Continue steroids x10 days - Low threshold (development of pulmonary symptoms or signs) to start remdesivir. Still no dyspnea or hypoxia. CRP trending downward, DD normal.  - Continue airborne, contact precautions. PPE including surgical gown, gloves, cap, shoe covers, and CAPR used during this encounter in a negative pressure room.  - Maintain euvolemia/net negative.  - Add antitussive.  UTI, neurogenic bladder, chronic suprapubic catheter:  - Continue formulary equivalent of vesicare - Continue catheter with plans to exchange next week on schedule. - Urinalysis positive > on CTX pending urine culture results.   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  - Continue home medications as she was taking  including valium, dilaudid, lyrica TID.  - Turn q2h, assist as needed for hygiene  Chronic pain s/p significant spinal surgeries:  - Continue home regimen which includes lyrica, dilaudid and prn ibuprofen.   Anxiety:  - Continue valium. Ordered as she takes it at home chronically.   Pancytopenia: Suspected to be due to covid-19 infection.  - Trend. Stable. Improving.   Pressure injuries as listed below: All POA.  - Offload as able - WOC consulted - Continue regular follow up with wound care center (chronic patient there)  RN Pressure Injury Documentation: Pressure Injury 01/19/18 Stage II -  Partial thickness loss of dermis presenting as a shallow open ulcer with a red, pink wound bed without slough. (Active)  01/19/18 0500  Location: Hip  Location Orientation: Anterior;Left;Proximal;Upper  Staging: Stage II -  Partial thickness loss of dermis presenting as a shallow open ulcer with a red, pink wound bed without slough.  Wound Description (Comments):   Present on Admission: Yes     Pressure Injury 07/09/18 Stage IV - Full thickness tissue loss with exposed bone, tendon or muscle. (Active)  07/09/18 1600  Location: Sacrum  Location Orientation: Mid  Staging: Stage IV - Full thickness tissue loss with exposed bone, tendon or muscle.  Wound Description (Comments):   Present on Admission: Yes     Pressure Injury 07/09/18 Stage IV - Full thickness tissue loss with exposed bone, tendon or muscle. (Active)  07/09/18 2030  Location: Ischial tuberosity  Location Orientation: Left  Staging: Stage IV - Full thickness tissue loss with exposed bone, tendon or muscle.  Wound Description (Comments):   Present on Admission: Yes     Pressure Injury 07/09/18  Stage III -  Full thickness tissue loss. Subcutaneous fat may be visible but bone, tendon or muscle are NOT exposed. labial folds, perineum (Active)  07/09/18 2030  Location: Other (Comment)  Location Orientation: Left  Staging: Stage  III -  Full thickness tissue loss. Subcutaneous fat may be visible but bone, tendon or muscle are NOT exposed.  Wound Description (Comments): labial folds, perineum  Present on Admission: Yes     Pressure Injury 07/09/18 Stage IV - Full thickness tissue loss with exposed bone, tendon or muscle. (Active)  07/09/18 2030  Location: Ischial tuberosity  Location Orientation: Right  Staging: Stage IV - Full thickness tissue loss with exposed bone, tendon or muscle.  Wound Description (Comments):   Present on Admission: Yes     Pressure Injury 07/09/18 Stage III -  Full thickness tissue loss. Subcutaneous fat may be visible but bone, tendon or muscle are NOT exposed. per patient area of extra skin  (Active)  07/09/18 2030  Location: Hip  Location Orientation: Left  Staging: Stage III -  Full thickness tissue loss. Subcutaneous fat may be visible but bone, tendon or muscle are NOT exposed.  Wound Description (Comments): per patient area of extra skin   Present on Admission: Yes     Pressure Injury 07/09/18 per patient pressure wound from where laptop rests in her lap  (Active)  07/09/18 2030  Location: Thigh  Location Orientation: Right  Staging:   Wound Description (Comments): per patient pressure wound from where laptop rests in her lap   Present on Admission: Yes     Pressure Injury 07/09/18 Deep Tissue Injury - Purple or maroon localized area of discolored intact skin or blood-filled blister due to damage of underlying soft tissue from pressure and/or shear. (Active)  07/09/18 1734  Location: Heel  Location Orientation: Left  Staging: Deep Tissue Injury - Purple or maroon localized area of discolored intact skin or blood-filled blister due to damage of underlying soft tissue from pressure and/or shear.  Wound Description (Comments):   Present on Admission: Yes     Pressure Injury 07/12/18 Deep Tissue Injury - Purple or maroon localized area of discolored intact skin or blood-filled  blister due to damage of underlying soft tissue from pressure and/or shear. (Active)  07/12/18 1737  Location: Heel  Location Orientation: Right  Staging: Deep Tissue Injury - Purple or maroon localized area of discolored intact skin or blood-filled blister due to damage of underlying soft tissue from pressure and/or shear.  Wound Description (Comments):   Present on Admission: Yes     Pressure Injury 09/08/19 Ischial tuberosity Left Stage III -  Full thickness tissue loss. Subcutaneous fat may be visible but bone, tendon or muscle are NOT exposed. red, draining (Active)  09/08/19 1913  Location: Ischial tuberosity  Location Orientation: Left  Staging: Stage III -  Full thickness tissue loss. Subcutaneous fat may be visible but bone, tendon or muscle are NOT exposed.  Wound Description (Comments): red, draining  Present on Admission: Yes     Pressure Injury Ischial tuberosity Right Stage III -  Full thickness tissue loss. Subcutaneous fat may be visible but bone, tendon or muscle are NOT exposed. red, draining (Active)     Location: Ischial tuberosity  Location Orientation: Right  Staging: Stage III -  Full thickness tissue loss. Subcutaneous fat may be visible but bone, tendon or muscle are NOT exposed.  Wound Description (Comments): red, draining  Present on Admission: Yes     Pressure Injury  09/08/19 Sacrum Stage I -  Intact skin with non-blanchable redness of a localized area usually over a bony prominence. pink, unblanchable (Active)  09/08/19 1914  Location: Sacrum  Location Orientation:   Staging: Stage I -  Intact skin with non-blanchable redness of a localized area usually over a bony prominence.  Wound Description (Comments): pink, unblanchable  Present on Admission: Yes   DVT prophylaxis: Lovenox 30mg , Pt also has IVC filter in place. Code Status: Full Family Communication: None at bedside. Spouse also admitted here. Disposition Plan: Home once clinically stable and  caretaker is available to facilitate safe discharge. Anticipated 9/21   Consultants:   None  Procedures:   None  Antimicrobials:  Ceftriaxone 9/18 >>   Subjective: Eating ok, not a breakfast person. Wants cough syrup to help get phlegm up, denies shortness of breath, chest pain, palpitations. Misses her 2 dogs.   Objective: Vitals:   09/09/19 2000 09/09/19 2006 09/09/19 2049 09/10/19 0801  BP:  113/76  108/69  Pulse:    96  Resp: 18 (!) 21 18 18   Temp:  98.9 F (37.2 C)  99.4 F (37.4 C)  TempSrc:  Oral  Oral  SpO2:    95%  Weight:      Height:        Intake/Output Summary (Last 24 hours) at 09/10/2019 1308 Last data filed at 09/10/2019 4431 Gross per 24 hour  Intake 820 ml  Output 1650 ml  Net -830 ml   Filed Weights   09/08/19 1628  Weight: 41.7 kg   Gen: Pleasant, chronically ill-appearing female in no distress Pulm: Nonlabored breathing room air. Clear. CV: Regular rate and rhythm. No murmur, rub, or gallop. No JVD, no dependent edema. GI: Abdomen soft, non-tender, non-distended, with normoactive bowel sounds.  Ext: Warm, spastic with decreased muscle bulk Skin: No new rashes, lesions or ulcers on visualized skin. Neuro: Alert and oriented. Incomplete quadriplegia, unchanged. No dysarthria. Psych: Judgement and insight appear fair. Mood euthymic & affect congruent. Behavior is appropriate.    Data Reviewed: I have personally reviewed following labs and imaging studies  CBC: Recent Labs  Lab 09/07/19 2109 09/10/19 0123  WBC 2.0* 2.5*  NEUTROABS 1.3* 2.1  HGB 11.9* 11.3*  HCT 39.3 37.5  MCV 83.6 83.5  PLT 105* 540*   Basic Metabolic Panel: Recent Labs  Lab 09/07/19 2109 09/09/19 0243  NA 136 138  K 3.8 4.0  CL 98 101  CO2 28 28  GLUCOSE 139* 138*  BUN 5* 7  CREATININE 0.50 0.41*  CALCIUM 7.8* 8.1*   GFR: Estimated Creatinine Clearance: 57.9 mL/min (A) (by C-G formula based on SCr of 0.41 mg/dL (L)). Liver Function Tests: Recent Labs   Lab 09/07/19 2109 09/09/19 0243  AST 34 23  ALT 18 16  ALKPHOS 64 60  BILITOT 0.4 0.6  PROT 7.4 7.5  ALBUMIN 3.3* 3.3*   No results for input(s): LIPASE, AMYLASE in the last 168 hours. No results for input(s): AMMONIA in the last 168 hours. Coagulation Profile: No results for input(s): INR, PROTIME in the last 168 hours. Cardiac Enzymes: No results for input(s): CKTOTAL, CKMB, CKMBINDEX, TROPONINI in the last 168 hours. BNP (last 3 results) No results for input(s): PROBNP in the last 8760 hours. HbA1C: No results for input(s): HGBA1C in the last 72 hours. CBG: No results for input(s): GLUCAP in the last 168 hours. Lipid Profile: No results for input(s): CHOL, HDL, LDLCALC, TRIG, CHOLHDL, LDLDIRECT in the last 72 hours. Thyroid Function  Tests: No results for input(s): TSH, T4TOTAL, FREET4, T3FREE, THYROIDAB in the last 72 hours. Anemia Panel: Recent Labs    09/09/19 0243  FERRITIN 22   Urine analysis:    Component Value Date/Time   COLORURINE AMBER (A) 09/09/2019 1146   APPEARANCEUR HAZY (A) 09/09/2019 1146   LABSPEC 1.017 09/09/2019 1146   PHURINE 6.0 09/09/2019 1146   GLUCOSEU NEGATIVE 09/09/2019 1146   HGBUR SMALL (A) 09/09/2019 1146   BILIRUBINUR NEGATIVE 09/09/2019 1146   KETONESUR NEGATIVE 09/09/2019 1146   PROTEINUR 30 (A) 09/09/2019 1146   UROBILINOGEN 0.2 08/03/2015 1714   NITRITE NEGATIVE 09/09/2019 1146   LEUKOCYTESUR LARGE (A) 09/09/2019 1146   Recent Results (from the past 240 hour(s))  SARS Coronavirus 2 [facility](Hospital order, Performed [facility] lab) Nasopharyngeal Nasopharyngeal Swab     Status: Abnormal   Collection Time: 09/07/19 11:56 PM   Specimen: Nasopharyngeal Swab  Result Value Ref Range Status   SARS Coronavirus 2 POSITIVE (A) NEGATIVE Final    Comment: RESULT CALLED TO, READ BACK BY AND VERIFIED WITH: FRICKEY,J @ 0328 ON [id number] BY POTEAT,S (NOTE) If result is NEGATIVE SARS-CoV-2 target nucleic acids are NOT DETECTED. The  SARS-CoV-2 RNA is generally detectable in upper and lower  respiratory specimens during the acute phase of infection. The lowest  concentration of SARS-CoV-2 viral copies this assay can detect is 250  copies / mL. A negative result does not preclude SARS-CoV-2 infection  and should not be used as the sole basis for treatment or other  patient management decisions.  A negative result may occur with  improper specimen collection / handling, submission of specimen other  than nasopharyngeal swab, presence of viral mutation(s) within the  areas targeted by this assay, and inadequate number of viral copies  (<250 copies / mL). A negative result must be combined with clinical  observations, patient history, and epidemiological information. If result is POSITIVE SARS-CoV-2 target nucleic acids are DETECTED.  The SARS-CoV-2 RNA is generally detectable in upper and lower  respiratory specimens during the acute phase of infection.  Positive  results are indicative of active infection with SARS-CoV-2.  Clinical  correlation with patient history and other diagnostic information is  necessary to determine patient infection status.  Positive results do  not rule out bacterial infection or co-infection with other viruses. If result is PRESUMPTIVE POSTIVE SARS-CoV-2 nucleic acids MAY BE PRESENT.   A presumptive positive result was obtained on the submitted specimen  and confirmed on repeat testing.  While 2019 novel coronavirus  (SARS-CoV-2) nucleic acids may be present in the submitted sample  additional confirmatory testing may be necessary for epidemiological  and / or clinical management purposes  to differentiate between  SARS-CoV-2 and other Sarbecovirus currently known to infect humans.  If clinically indicated additional testing with an alternate test  methodology [phone]  is advised. The SARS-CoV-2 RNA is generally  detectable in upper and lower respiratory specimens during the acute   phase of infection. The expected result is Negative. Fact Sheet for Patients:  BoilerBrush.com.cyhttps://www.fda.gov/media/136312/download Fact Sheet for Healthcare Providers: https://pope.com/https://www.fda.gov/media/136313/download This test is not yet approved or cleared by the [place] FDA and has been authorized for detection and/or diagnosis of SARS-CoV-2 by FDA under an Emergency Use Authorization (EUA).  This EUA will remain in effect (meaning this test can be used) for the duration of the COVID-19 declaration under Section 564(b)(1) of the Act, 21 U.S.C. section 360bbb-3(b)(1), unless the authorization is terminated or revoked sooner. Performed  at [facility], [address] [place], [place] [address]       Radiology Studies: No results found.  Scheduled Meds: . darifenacin  15 mg Oral Daily  . dexamethasone  6 mg Oral Q24H  . diazepam  5 mg Oral TID  . enoxaparin (LOVENOX) injection  30 mg Subcutaneous Q24H  . hydrocerin   Topical BID  . HYDROmorphone  4 mg Oral TID  . pantoprazole  40 mg Oral Daily  . pregabalin  75 mg Oral TID   Continuous Infusions: . cefTRIAXone (ROCEPHIN)  IV 1 g (09/10/19 0912)     LOS: 2 days   Time spent: 25 minutes.  Tyrone Nineyan B Annalysse Shoemaker, MD [facility] [email or web address] Password [email or web address] 09/10/2019, 1:08 PM

## 2019-09-10 NOTE — Progress Notes (Signed)
Called lab at [facility] to confirm order to add urine culture to urine collected for UA. Notified that it will be added.

## 2019-09-11 ENCOUNTER — Inpatient Hospital Stay (HOSPITAL_COMMUNITY): Payer: Medicare Other

## 2019-09-11 LAB — COMPREHENSIVE METABOLIC PANEL
ALT: 26 U/L (ref 0–44)
AST: 53 U/L — ABNORMAL HIGH (ref 15–41)
Albumin: 3.1 g/dL — ABNORMAL LOW (ref 3.5–5.0)
Alkaline Phosphatase: 59 U/L (ref 38–126)
Anion gap: 9 (ref 5–15)
BUN: 11 mg/dL (ref 6–20)
CO2: 28 mmol/L (ref 22–32)
Calcium: 8.3 mg/dL — ABNORMAL LOW (ref 8.9–10.3)
Chloride: 101 mmol/L (ref 98–111)
Creatinine, Ser: 0.49 mg/dL (ref 0.44–1.00)
GFR calc Af Amer: >60 mL/min (ref >60)
GFR calc non Af Amer: >60 mL/min (ref >60)
Glucose, Bld: 112 mg/dL — ABNORMAL HIGH (ref 70–99)
Potassium: 4.5 mmol/L (ref 3.5–5.1)
Sodium: 138 mmol/L (ref 135–145)
Total Bilirubin: 0.4 mg/dL (ref 0.3–1.2)
Total Protein: 7.2 g/dL (ref 6.5–8.1)

## 2019-09-11 LAB — CBC WITH DIFFERENTIAL/PLATELET
Abs Immature Granulocytes: 0.01 10*3/uL (ref 0.00–0.07)
Basophils Absolute: 0 10*3/uL (ref 0.0–0.1)
Basophils Relative: 0 %
Eosinophils Absolute: 0 10*3/uL (ref 0.0–0.5)
Eosinophils Relative: 0 %
HCT: 36.4 % (ref 36.0–46.0)
Hemoglobin: 11.1 g/dL — ABNORMAL LOW (ref 12.0–15.0)
Immature Granulocytes: 1 %
Lymphocytes Relative: 15 %
Lymphs Abs: 0.3 10*3/uL — ABNORMAL LOW (ref 0.7–4.0)
MCH: 25.1 pg — ABNORMAL LOW (ref 26.0–34.0)
MCHC: 30.5 g/dL (ref 30.0–36.0)
MCV: 82.2 fL (ref 80.0–100.0)
Monocytes Absolute: 0.1 10*3/uL (ref 0.1–1.0)
Monocytes Relative: 6 %
Neutro Abs: 1.5 10*3/uL — ABNORMAL LOW (ref 1.7–7.7)
Neutrophils Relative %: 78 %
Platelets: 153 10*3/uL (ref 150–400)
RBC: 4.43 MIL/uL (ref 3.87–5.11)
RDW: 16.1 % — ABNORMAL HIGH (ref 11.5–15.5)
WBC: 1.9 10*3/uL — ABNORMAL LOW (ref 4.0–10.5)
nRBC: 0 % (ref 0.0–0.2)

## 2019-09-11 LAB — C-REACTIVE PROTEIN: CRP: 8 mg/dL — ABNORMAL HIGH (ref <1.0)

## 2019-09-11 LAB — PROCALCITONIN: Procalcitonin: 0.29 ng/mL

## 2019-09-11 MED ORDER — SODIUM CHLORIDE 0.9 % IV SOLN
200.0000 mg | Freq: Once | INTRAVENOUS | Status: AC
  Administered 2019-09-11: 200 mg via INTRAVENOUS
  Filled 2019-09-11: qty 40

## 2019-09-11 MED ORDER — SODIUM CHLORIDE 0.9 % IV SOLN
100.0000 mg | INTRAVENOUS | Status: AC
  Administered 2019-09-12 – 2019-09-15 (×4): 100 mg via INTRAVENOUS
  Filled 2019-09-11 (×4): qty 20

## 2019-09-11 NOTE — Progress Notes (Addendum)
Patient's husband at bedside. Updated on plan of care and condition.   19:00 patient disimpacted per her request.

## 2019-09-11 NOTE — Progress Notes (Signed)
PROGRESS NOTE  [name]  ZOX:[id number] DOB: [birth year] DOA: 09/07/2019 PCP: [facility]   Brief Narrative: [name] is a 33 y.o. 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 With 1 week of progressive symptoms and onset of similar symptoms in her spouse/caretaker, they presented to the ED 9/16. She was reportedly hypoxic on room air without infiltrate on CXR and SAR-CoV-2 PCR was positive. Both she and her spouse were admitted to [facility] 9/17. Steroids started, though she exhibited no respiratory symptoms, so remdesivir not started.   Assessment & Plan: Principal Problem:   COVID-19 virus infection Active Problems:   Pressure injury of skin   Recurrent UTI   Chronic incomplete spastic tetraplegia (HCC)   Chronic suprapubic catheter (HCC)   Pharyngitis   COVID-19  Acute hypoxic respiratory failure due to covid-19 pneumonia: No hypoxia or respiratory complaints at presentation. Initial CXR with no infiltrate, some opacity on repeat CXR which has developed more definitively into multifocal PNA as of 9/20. Atelectasis likely also contributing to hypoxia. Fever overnight with rising CRP and declining PCT and persistent lymphopenia.  - Continue steroids x10 days - Will start remdesivir given CXR infiltrates, hypoxia.  - Trend inflammatory markers. CRP up 6 > 8 despite several days of steroids and antibiotics.  - Continue airborne, contact precautions. PPE including surgical gown, gloves, cap, shoe covers, and CAPR used during this encounter in a negative pressure room.  - Maintain euvolemia/net negative.  - Add antitussive.  UTI, neurogenic bladder, chronic suprapubic catheter: PCT declining significantly.  - Continue formulary equivalent of vesicare - Continue catheter with plans to  exchange next week on schedule. - Urinalysis positive > Continue CTX pending urine culture results.   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  - Continue home medications as she was taking including valium, dilaudid, lyrica TID.  - Turn q2h, assist as needed for hygiene  Chronic pain s/p significant spinal surgeries:  - Continue home regimen which includes lyrica, dilaudid and prn ibuprofen.   Anxiety:  - Continue valium. Ordered as she takes it at home chronically.   Pancytopenia: Suspected to be due to covid-19 infection.  - Trend. Stable. Improving.   Pressure injuries as listed below: All POA.  - Offload as able - WOC consulted - Continue regular follow up with wound care center (chronic patient there)  RN Pressure Injury Documentation: Pressure Injury 01/19/18 Stage II -  Partial thickness loss of dermis presenting as a shallow open ulcer with a red, pink wound bed without slough. (Active)  01/19/18 0500  Location: Hip  Location Orientation: Anterior;Left;Proximal;Upper  Staging: Stage II -  Partial thickness loss of dermis presenting as a shallow open ulcer with a red, pink wound bed without slough.  Wound Description (Comments):   Present on Admission: Yes     Pressure Injury 07/09/18 Stage IV - Full thickness tissue loss with exposed bone, tendon or muscle. (Active)  07/09/18 1600  Location: Sacrum  Location Orientation: Mid  Staging: Stage IV - Full thickness tissue loss with exposed bone, tendon or muscle.  Wound Description (Comments):   Present on Admission: Yes     Pressure Injury 07/09/18 Stage IV - Full thickness tissue loss with exposed bone, tendon or muscle. (Active)  07/09/18 2030  Location: Ischial  tuberosity  Location Orientation: Left  Staging: Stage IV - Full thickness tissue loss with exposed bone, tendon or muscle.  Wound Description (Comments):   Present on Admission: Yes      Pressure Injury 07/09/18 Stage III -  Full thickness tissue loss. Subcutaneous fat may be visible but bone, tendon or muscle are NOT exposed. labial folds, perineum (Active)  07/09/18 2030  Location: Other (Comment)  Location Orientation: Left  Staging: Stage III -  Full thickness tissue loss. Subcutaneous fat may be visible but bone, tendon or muscle are NOT exposed.  Wound Description (Comments): labial folds, perineum  Present on Admission: Yes     Pressure Injury 07/09/18 Stage IV - Full thickness tissue loss with exposed bone, tendon or muscle. (Active)  07/09/18 2030  Location: Ischial tuberosity  Location Orientation: Right  Staging: Stage IV - Full thickness tissue loss with exposed bone, tendon or muscle.  Wound Description (Comments):   Present on Admission: Yes     Pressure Injury 07/09/18 Stage III -  Full thickness tissue loss. Subcutaneous fat may be visible but bone, tendon or muscle are NOT exposed. per patient area of extra skin  (Active)  07/09/18 2030  Location: Hip  Location Orientation: Left  Staging: Stage III -  Full thickness tissue loss. Subcutaneous fat may be visible but bone, tendon or muscle are NOT exposed.  Wound Description (Comments): per patient area of extra skin   Present on Admission: Yes     Pressure Injury 07/09/18 per patient pressure wound from where laptop rests in her lap  (Active)  07/09/18 2030  Location: Thigh  Location Orientation: Right  Staging:   Wound Description (Comments): per patient pressure wound from where laptop rests in her lap   Present on Admission: Yes     Pressure Injury 07/09/18 Deep Tissue Injury - Purple or maroon localized area of discolored intact skin or blood-filled blister due to damage of underlying soft tissue from pressure and/or shear. (Active)  07/09/18 1734  Location: Heel  Location Orientation: Left  Staging: Deep Tissue Injury - Purple or maroon localized area of discolored intact skin or blood-filled  blister due to damage of underlying soft tissue from pressure and/or shear.  Wound Description (Comments):   Present on Admission: Yes     Pressure Injury 07/12/18 Deep Tissue Injury - Purple or maroon localized area of discolored intact skin or blood-filled blister due to damage of underlying soft tissue from pressure and/or shear. (Active)  07/12/18 1737  Location: Heel  Location Orientation: Right  Staging: Deep Tissue Injury - Purple or maroon localized area of discolored intact skin or blood-filled blister due to damage of underlying soft tissue from pressure and/or shear.  Wound Description (Comments):   Present on Admission: Yes     Pressure Injury 09/08/19 Ischial tuberosity Left Stage III -  Full thickness tissue loss. Subcutaneous fat may be visible but bone, tendon or muscle are NOT exposed. red, draining (Active)  09/08/19 1913  Location: Ischial tuberosity  Location Orientation: Left  Staging: Stage III -  Full thickness tissue loss. Subcutaneous fat may be visible but bone, tendon or muscle are NOT exposed.  Wound Description (Comments): red, draining  Present on Admission: Yes     Pressure Injury Ischial tuberosity Right Stage III -  Full thickness tissue loss. Subcutaneous fat may be visible but bone, tendon or muscle are NOT exposed. red, draining (Active)     Location: Ischial tuberosity  Location Orientation: Right  Staging: Stage  III -  Full thickness tissue loss. Subcutaneous fat may be visible but bone, tendon or muscle are NOT exposed.  Wound Description (Comments): red, draining  Present on Admission: Yes     Pressure Injury 09/08/19 Sacrum Stage I -  Intact skin with non-blanchable redness of a localized area usually over a bony prominence. pink, unblanchable (Active)  09/08/19 1914  Location: Sacrum  Location Orientation:   Staging: Stage I -  Intact skin with non-blanchable redness of a localized area usually over a bony prominence.  Wound Description  (Comments): pink, unblanchable  Present on Admission: Yes   DVT prophylaxis: Lovenox 30mg , Pt also has IVC filter in place. Code Status: Full Family Communication: None at bedside. Spouse also admitted here. Disposition Plan: Has developed hypoxia with stable multifocal infiltrates, so starting remdesivir. If clinically improving, can DC home after final dose of remdesivir 9/24.    Consultants:   None  Procedures:   None  Antimicrobials:  Ceftriaxone 9/18 >>   Remdesivir 9/20 - 9/24  Subjective: Developed some cough and lower oxygen saturations last night. No dyspnea but required 2L O2 overnight. Then had fever to 100.8F. Denies abd pain, no other new complaints. Misses her dogs very much and is reluctant to stay in the hospital for the full course of remdesivir.  Objective: Vitals:   09/10/19 2311 09/11/19 0358 09/11/19 0747 09/11/19 0833  BP:  134/82 (!) 133/94   Pulse:  83 74   Resp:  16 18   Temp: 98.9 F (37.2 C) 98.7 F (37.1 C) 98.1 F (36.7 C)   TempSrc: Oral Oral Oral   SpO2:  94% 94% 90%  Weight:      Height:        Intake/Output Summary (Last 24 hours) at 09/11/2019 0952 Last data filed at 09/10/2019 2200 Gross per 24 hour  Intake 1600 ml  Output 1450 ml  Net 150 ml   Filed Weights   09/08/19 1628  Weight: 41.7 kg   Gen: Thin female in no distress Pulm: Nonlabored, tachypneic, 89-90% on room air at rest. Diminished without wheezing or crackles. CV: Regular rate and rhythm. No murmur, rub, or gallop. No JVD, no dependent edema. GI: Abdomen soft, non-tender, non-distended, with normoactive bowel sounds.  Ext: Unchanged spasticity Skin: No new rashes, lesions or ulcers on visualized skin. Neuro: Alert and oriented. No new focal neurological deficits. Psych: Judgement and insight appear fair. Mood depressed & affect congruent. Behavior is appropriate.    Data Reviewed: I have personally reviewed following labs and imaging studies  CBC: Recent Labs   Lab 09/07/19 2109 09/10/19 0123 09/11/19 0124  WBC 2.0* 2.5* 1.9*  NEUTROABS 1.3* 2.1 1.5*  HGB 11.9* 11.3* 11.1*  HCT 39.3 37.5 36.4  MCV 83.6 83.5 82.2  PLT 105* 146* 027   Basic Metabolic Panel: Recent Labs  Lab 09/07/19 2109 09/09/19 0243 09/11/19 0124  NA 136 138 138  K 3.8 4.0 4.5  CL 98 101 101  CO2 28 28 28   GLUCOSE 139* 138* 112*  BUN 5* 7 11  CREATININE 0.50 0.41* 0.49  CALCIUM 7.8* 8.1* 8.3*   GFR: Estimated Creatinine Clearance: 57.9 mL/min (by C-G formula based on SCr of 0.49 mg/dL). Liver Function Tests: Recent Labs  Lab 09/07/19 2109 09/09/19 0243 09/11/19 0124  AST 34 23 53*  ALT 18 16 26   ALKPHOS 64 60 59  BILITOT 0.4 0.6 0.4  PROT 7.4 7.5 7.2  ALBUMIN 3.3* 3.3* 3.1*   No results for  input(s): LIPASE, AMYLASE in the last 168 hours. No results for input(s): AMMONIA in the last 168 hours. Coagulation Profile: No results for input(s): INR, PROTIME in the last 168 hours. Cardiac Enzymes: No results for input(s): CKTOTAL, CKMB, CKMBINDEX, TROPONINI in the last 168 hours. BNP (last 3 results) No results for input(s): PROBNP in the last 8760 hours. HbA1C: No results for input(s): HGBA1C in the last 72 hours. CBG: No results for input(s): GLUCAP in the last 168 hours. Lipid Profile: No results for input(s): CHOL, HDL, LDLCALC, TRIG, CHOLHDL, LDLDIRECT in the last 72 hours. Thyroid Function Tests: No results for input(s): TSH, T4TOTAL, FREET4, T3FREE, THYROIDAB in the last 72 hours. Anemia Panel: Recent Labs    09/09/19 0243  FERRITIN 22   Urine analysis:    Component Value Date/Time   COLORURINE AMBER (A) 09/09/2019 1146   APPEARANCEUR HAZY (A) 09/09/2019 1146   LABSPEC 1.017 09/09/2019 1146   PHURINE 6.0 09/09/2019 1146   GLUCOSEU NEGATIVE 09/09/2019 1146   HGBUR SMALL (A) 09/09/2019 1146   BILIRUBINUR NEGATIVE 09/09/2019 1146   KETONESUR NEGATIVE 09/09/2019 1146   PROTEINUR 30 (A) 09/09/2019 1146   UROBILINOGEN 0.2 08/03/2015  1714   NITRITE NEGATIVE 09/09/2019 1146   LEUKOCYTESUR LARGE (A) 09/09/2019 1146   Recent Results (from the past 240 hour(s))  SARS Coronavirus 2 [facility](Hospital order, Performed in [facility]) Nasopharyngeal Nasopharyngeal Swab     Status: Abnormal   Collection Time: 09/07/19 11:56 PM   Specimen: Nasopharyngeal Swab  Result Value Ref Range Status   SARS Coronavirus 2 POSITIVE (A) NEGATIVE Final    Comment: RESULT CALLED TO, READ BACK BY AND VERIFIED WITH: FRICKEY,J @ 0328 ON [id number] BY POTEAT,S (NOTE) If result is NEGATIVE SARS-CoV-2 target nucleic acids are NOT DETECTED. The SARS-CoV-2 RNA is generally detectable in upper and lower  respiratory specimens during the acute phase of infection. The lowest  concentration of SARS-CoV-2 viral copies this assay can detect is 250  copies / mL. A negative result does not preclude SARS-CoV-2 infection  and should not be used as the sole basis for treatment or other  patient management decisions.  A negative result may occur with  improper specimen collection / handling, submission of specimen other  than nasopharyngeal swab, presence of viral mutation(s) within the  areas targeted by this assay, and inadequate number of viral copies  (<250 copies / mL). A negative result must be combined with clinical  observations, patient history, and epidemiological information. If result is POSITIVE SARS-CoV-2 target nucleic acids are DETECTED.  The SARS-CoV-2 RNA is generally detectable in upper and lower  respiratory specimens during the acute phase of infection.  Positive  results are indicative of active infection with SARS-CoV-2.  Clinical  correlation with patient history and other diagnostic information is  necessary to determine patient infection status.  Positive results do  not rule out bacterial infection or co-infection with other viruses. If result is PRESUMPTIVE POSTIVE SARS-CoV-2 nucleic acids MAY BE PRESENT.   A presumptive  positive result was obtained on the submitted specimen  and confirmed on repeat testing.  While 2019 novel coronavirus  (SARS-CoV-2) nucleic acids may be present in the submitted sample  additional confirmatory testing may be necessary for epidemiological  and / or clinical management purposes  to differentiate between  SARS-CoV-2 and other Sarbecovirus currently known to infect humans.  If clinically indicated additional testing with an alternate test  methodology [phone]  is advised. The SARS-CoV-2 RNA is generally  detectable in upper and lower respiratory specimens during the acute  phase of infection. The expected result is Negative. Fact Sheet for Patients:  BoilerBrush.com.cyhttps://www.fda.gov/media/136312/download Fact Sheet for Healthcare Providers: https://pope.com/https://www.fda.gov/media/136313/download This test is not yet approved or cleared by the [place] FDA and has been authorized for detection and/or diagnosis of SARS-CoV-2 by FDA under an Emergency Use Authorization (EUA).  This EUA will remain in effect (meaning this test can be used) for the duration of the COVID-19 declaration under Section 564(b)(1) of the Act, 21 U.S.C. section 360bbb-3(b)(1), unless the authorization is terminated or revoked sooner. Performed at [facility], [address] [place], [place] [address]   Culture, Urine     Status: Abnormal (Preliminary result)   Collection Time: 09/09/19 11:45 AM   Specimen: Urine, Random  Result Value Ref Range Status   Specimen Description   Final    URINE, RANDOM Performed at [facility], [address] [place], [place] [address]    Special Requests   Final    NONE Performed at [facility], [address] [place], [place] [address]    Culture >=100,000 COLONIES/mL GRAM NEGATIVE RODS (A)  Final   Report Status PENDING  Incomplete      Radiology Studies: Dg Chest 1 View  Result Date: 09/11/2019 CLINICAL DATA:   Cough, fever, weakness. covid + EXAM: CHEST  1 VIEW COMPARISON:  09/08/2019 and older exams. FINDINGS: Lungs show interstitial mild hazy airspace opacities the mid to lower lungs evident the left, unchanged from the previous exam consistent with multifocal pneumonia. Additional linear basilar opacities consistent with atelectasis. No new lung abnormalities. No convincing pleural effusion.  No pneumothorax. IMPRESSION: 1. Stable appearance of the lungs compared to the most recent prior study. Hazy lung opacities consistent multifocal pneumonia. Electronically Signed   By: Amie Portlandavid  Ormond M.D.   On: 09/11/2019 04:39    Scheduled Meds: . darifenacin  15 mg Oral Daily  . dexamethasone  6 mg Oral Q24H  . diazepam  5 mg Oral TID  . enoxaparin (LOVENOX) injection  30 mg Subcutaneous Q24H  . hydrocerin   Topical BID  . HYDROmorphone  4 mg Oral TID  . pantoprazole  40 mg Oral Daily  . pregabalin  75 mg Oral TID   Continuous Infusions: . cefTRIAXone (ROCEPHIN)  IV 1 g (09/11/19 0940)     LOS: 3 days   Time spent: 35 minutes.  Tyrone Nineyan B Larhonda Dettloff, MD [facility] [email or web address] Password [email or web address] 09/11/2019, 9:52 AM

## 2019-09-12 LAB — CBC WITH DIFFERENTIAL/PLATELET
Abs Immature Granulocytes: 0.02 10*3/uL (ref 0.00–0.07)
Basophils Absolute: 0 10*3/uL (ref 0.0–0.1)
Basophils Relative: 1 %
Eosinophils Absolute: 0 10*3/uL (ref 0.0–0.5)
Eosinophils Relative: 0 %
HCT: 39.1 % (ref 36.0–46.0)
Hemoglobin: 12.2 g/dL (ref 12.0–15.0)
Immature Granulocytes: 1 %
Lymphocytes Relative: 26 %
Lymphs Abs: 0.5 10*3/uL — ABNORMAL LOW (ref 0.7–4.0)
MCH: 25.4 pg — ABNORMAL LOW (ref 26.0–34.0)
MCHC: 31.2 g/dL (ref 30.0–36.0)
MCV: 81.3 fL (ref 80.0–100.0)
Monocytes Absolute: 0.1 10*3/uL (ref 0.1–1.0)
Monocytes Relative: 6 %
Neutro Abs: 1.3 10*3/uL — ABNORMAL LOW (ref 1.7–7.7)
Neutrophils Relative %: 66 %
Platelets: 193 10*3/uL (ref 150–400)
RBC: 4.81 MIL/uL (ref 3.87–5.11)
RDW: 16 % — ABNORMAL HIGH (ref 11.5–15.5)
WBC: 1.9 10*3/uL — ABNORMAL LOW (ref 4.0–10.5)
nRBC: 0 % (ref 0.0–0.2)

## 2019-09-12 LAB — C-REACTIVE PROTEIN: CRP: 5.9 mg/dL — ABNORMAL HIGH (ref <1.0)

## 2019-09-12 LAB — COMPREHENSIVE METABOLIC PANEL WITH GFR
ALT: 33 U/L (ref 0–44)
AST: 48 U/L — ABNORMAL HIGH (ref 15–41)
Albumin: 3.3 g/dL — ABNORMAL LOW (ref 3.5–5.0)
Alkaline Phosphatase: 51 U/L (ref 38–126)
Anion gap: 9 (ref 5–15)
BUN: 14 mg/dL (ref 6–20)
CO2: 29 mmol/L (ref 22–32)
Calcium: 8.7 mg/dL — ABNORMAL LOW (ref 8.9–10.3)
Chloride: 100 mmol/L (ref 98–111)
Creatinine, Ser: 0.35 mg/dL — ABNORMAL LOW (ref 0.44–1.00)
GFR calc Af Amer: >60 mL/min
GFR calc non Af Amer: >60 mL/min
Glucose, Bld: 111 mg/dL — ABNORMAL HIGH (ref 70–99)
Potassium: 4.4 mmol/L (ref 3.5–5.1)
Sodium: 138 mmol/L (ref 135–145)
Total Bilirubin: 0.4 mg/dL (ref 0.3–1.2)
Total Protein: 7.7 g/dL (ref 6.5–8.1)

## 2019-09-12 LAB — URINE CULTURE: Culture: >=100000 — AB

## 2019-09-12 LAB — PROCALCITONIN: Procalcitonin: <0.1 ng/mL

## 2019-09-12 MED ORDER — NYSTATIN 100000 UNIT/GM EX POWD
Freq: Three times a day (TID) | CUTANEOUS | Status: DC
  Administered 2019-09-12: 1 g via TOPICAL
  Administered 2019-09-12 – 2019-09-15 (×8): via TOPICAL
  Filled 2019-09-12: qty 15

## 2019-09-12 NOTE — Progress Notes (Signed)
PROGRESS NOTE  [name]  MBT:[id number] DOB: [birth year] DOA: 09/07/2019 PCP: [facility]   Brief Narrative: [name] is a 33 y.o. 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 With 1 week of progressive symptoms and onset of similar symptoms in her spouse/caretaker, they presented to the ED 9/16. She was reportedly hypoxic on room air without infiltrate on CXR and SAR-CoV-2 PCR was positive. Both she and her spouse were admitted to [facility] 9/17. Steroids started, though she exhibited no respiratory symptoms, so remdesivir not started. On 9/20, remdesivir started in response to hypoxia and infiltrates on CXR with subsequent improvement in symptoms, inflammatory markers, and hypoxia..   Assessment & Plan: Principal Problem:   COVID-19 virus infection Active Problems:   Pressure injury of skin   Recurrent UTI   Chronic incomplete spastic tetraplegia (HCC)   Chronic suprapubic catheter (HCC)   Pharyngitis   COVID-19  Acute hypoxic respiratory failure due to covid-19 pneumonia: No hypoxia or respiratory complaints at presentation. Initial CXR with no infiltrate, some opacity on repeat CXR which has developed more definitively into multifocal PNA as of 9/20. Atelectasis likely also contributing to hypoxia. Fever overnight with rising CRP and declining PCT and persistent lymphopenia.  - Continue steroids x10 days - Continue remdesivir x5 days (9/20 - 9/24) - Trend inflammatory markers. CRP up 6 > 8 despite several days of steroids and antibiotics so remdesivir started and CRP down today.  - Continue airborne, contact precautions. PPE including surgical gown, gloves, cap, shoe covers, and CAPR used during this encounter in a negative pressure room.  - Maintain euvolemia/net negative.  - Added  antitussive.  GNR UTI, neurogenic bladder, chronic suprapubic catheter: PCT declining significantly.  - Continue formulary equivalent of vesicare - Continue catheter with plans to exchange next week on schedule. - Continue CTX pending urine culture results/speciation/susceptibility. PCT has normalized.   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  - Continue home medications as she was taking including valium, dilaudid, lyrica TID.  - Turn q2h, assist as needed for hygiene  Chronic pain s/p significant spinal surgeries:  - Continue home regimen which includes lyrica, dilaudid and prn ibuprofen.   Anxiety:  - Continue valium. Ordered as she takes it at home chronically.   Pancytopenia: Suspected to be due to covid-19 infection.  - Trend. Stable. Improving.   Pressure injuries as listed below: All POA.  - Offload as able - WOC consulted - Continue regular follow up with wound care center (chronic patient there)  RN Pressure Injury Documentation: Pressure Injury 01/19/18 Stage II -  Partial thickness loss of dermis presenting as a shallow open ulcer with a red, pink wound bed without slough. (Active)  01/19/18 0500  Location: Hip  Location Orientation: Anterior;Left;Proximal;Upper  Staging: Stage II -  Partial thickness loss of dermis presenting as a shallow open ulcer with a red, pink wound bed without slough.  Wound Description (Comments):   Present on Admission: Yes     Pressure Injury 07/09/18 Stage IV - Full thickness tissue loss with exposed bone, tendon or muscle. (Active)  07/09/18 1600  Location: Sacrum  Location Orientation: Mid  Staging: Stage IV - Full thickness tissue loss with exposed bone, tendon or muscle.  Wound Description (Comments):   Present on Admission:  Yes     Pressure Injury 07/09/18 Stage IV - Full thickness tissue loss with exposed bone, tendon or muscle. (Active)  07/09/18 2030    Location: Ischial tuberosity  Location Orientation: Left  Staging: Stage IV - Full thickness tissue loss with exposed bone, tendon or muscle.  Wound Description (Comments):   Present on Admission: Yes     Pressure Injury 07/09/18 Stage III -  Full thickness tissue loss. Subcutaneous fat may be visible but bone, tendon or muscle are NOT exposed. labial folds, perineum (Active)  07/09/18 2030  Location: Other (Comment)  Location Orientation: Left  Staging: Stage III -  Full thickness tissue loss. Subcutaneous fat may be visible but bone, tendon or muscle are NOT exposed.  Wound Description (Comments): labial folds, perineum  Present on Admission: Yes     Pressure Injury 07/09/18 Stage IV - Full thickness tissue loss with exposed bone, tendon or muscle. (Active)  07/09/18 2030  Location: Ischial tuberosity  Location Orientation: Right  Staging: Stage IV - Full thickness tissue loss with exposed bone, tendon or muscle.  Wound Description (Comments):   Present on Admission: Yes     Pressure Injury 07/09/18 Stage III -  Full thickness tissue loss. Subcutaneous fat may be visible but bone, tendon or muscle are NOT exposed. per patient area of extra skin  (Active)  07/09/18 2030  Location: Hip  Location Orientation: Left  Staging: Stage III -  Full thickness tissue loss. Subcutaneous fat may be visible but bone, tendon or muscle are NOT exposed.  Wound Description (Comments): per patient area of extra skin   Present on Admission: Yes     Pressure Injury 07/09/18 per patient pressure wound from where laptop rests in her lap  (Active)  07/09/18 2030  Location: Thigh  Location Orientation: Right  Staging:   Wound Description (Comments): per patient pressure wound from where laptop rests in her lap   Present on Admission: Yes     Pressure Injury 07/09/18 Deep Tissue Injury - Purple or maroon localized area of discolored intact skin or blood-filled blister due to damage of underlying soft  tissue from pressure and/or shear. (Active)  07/09/18 1734  Location: Heel  Location Orientation: Left  Staging: Deep Tissue Injury - Purple or maroon localized area of discolored intact skin or blood-filled blister due to damage of underlying soft tissue from pressure and/or shear.  Wound Description (Comments):   Present on Admission: Yes     Pressure Injury 07/12/18 Deep Tissue Injury - Purple or maroon localized area of discolored intact skin or blood-filled blister due to damage of underlying soft tissue from pressure and/or shear. (Active)  07/12/18 1737  Location: Heel  Location Orientation: Right  Staging: Deep Tissue Injury - Purple or maroon localized area of discolored intact skin or blood-filled blister due to damage of underlying soft tissue from pressure and/or shear.  Wound Description (Comments):   Present on Admission: Yes     Pressure Injury 09/08/19 Ischial tuberosity Left Stage III -  Full thickness tissue loss. Subcutaneous fat may be visible but bone, tendon or muscle are NOT exposed. red, draining (Active)  09/08/19 1913  Location: Ischial tuberosity  Location Orientation: Left  Staging: Stage III -  Full thickness tissue loss. Subcutaneous fat may be visible but bone, tendon or muscle are NOT exposed.  Wound Description (Comments): red, draining  Present on Admission: Yes     Pressure Injury Ischial tuberosity Right Stage III -  Full thickness tissue loss. Subcutaneous  fat may be visible but bone, tendon or muscle are NOT exposed. red, draining (Active)     Location: Ischial tuberosity  Location Orientation: Right  Staging: Stage III -  Full thickness tissue loss. Subcutaneous fat may be visible but bone, tendon or muscle are NOT exposed.  Wound Description (Comments): red, draining  Present on Admission: Yes     Pressure Injury 09/08/19 Sacrum Stage I -  Intact skin with non-blanchable redness of a localized area usually over a bony prominence. pink,  unblanchable (Active)  09/08/19 1914  Location: Sacrum  Location Orientation:   Staging: Stage I -  Intact skin with non-blanchable redness of a localized area usually over a bony prominence.  Wound Description (Comments): pink, unblanchable  Present on Admission: Yes   DVT prophylaxis: Lovenox 30mg , Pt also has IVC filter in place. Code Status: Full Family Communication: Significant other at bedside. Disposition Plan: Has developed hypoxia with stable multifocal infiltrates, so starting remdesivir. If clinically improving, can DC home after final dose of remdesivir 9/24.    Consultants:   None  Procedures:   None  Antimicrobials:  Ceftriaxone 9/18 >>   Remdesivir 9/20 - 9/24  Subjective: Cough is stable, moderate, improved with antitussive. Wants to get up to chair today. No fever. Shortness of breath is minimal no chest pain.   Objective: Vitals:   09/11/19 2000 09/12/19 0434 09/12/19 0700 09/12/19 0802  BP: 117/76 (!) 143/83 (!) 140/92 (!) 140/97  Pulse: 78 72  (!) 53  Resp:  14 (!) 68 20  Temp: (!) 97.5 F (36.4 C) 97.7 F (36.5 C) 97.9 F (36.6 C)   TempSrc: Oral Oral    SpO2: 93% 93%  91%  Weight:      Height:        Intake/Output Summary (Last 24 hours) at 09/12/2019 0924 Last data filed at 09/12/2019 0600 Gross per 24 hour  Intake 830 ml  Output 2350 ml  Net -1520 ml   Filed Weights   09/08/19 1628  Weight: 41.7 kg   Gen: Thin female in no distress Pulm: Nonlabored breathing room air, 90-92%. Clear anteriorly. CV: Regular rate and rhythm. No murmur, rub, or gallop. No JVD, no dependent edema. GI: Abdomen soft, non-tender, non-distended, with normoactive bowel sounds.  Ext: Warm, no deformities Skin: No new rashes, lesions or ulcers on visualized skin. Neuro: Alert and oriented. No focal neurological deficits. Psych: Judgement and insight appear fair. Mood euthymic & affect congruent. Behavior is appropriate.    Data Reviewed: I have personally  reviewed following labs and imaging studies  CBC: Recent Labs  Lab 09/07/19 2109 09/10/19 0123 09/11/19 0124 09/12/19 0340  WBC 2.0* 2.5* 1.9* 1.9*  NEUTROABS 1.3* 2.1 1.5* 1.3*  HGB 11.9* 11.3* 11.1* 12.2  HCT 39.3 37.5 36.4 39.1  MCV 83.6 83.5 82.2 81.3  PLT 105* 146* 153 193   Basic Metabolic Panel: Recent Labs  Lab 09/07/19 2109 09/09/19 0243 09/11/19 0124 09/12/19 0340  NA 136 138 138 138  K 3.8 4.0 4.5 4.4  CL 98 101 101 100  CO2 28 28 28 29   GLUCOSE 139* 138* 112* 111*  BUN 5* 7 11 14   CREATININE 0.50 0.41* 0.49 0.35*  CALCIUM 7.8* 8.1* 8.3* 8.7*   GFR: Estimated Creatinine Clearance: 57.9 mL/min (A) (by C-G formula based on SCr of 0.35 mg/dL (L)). Liver Function Tests: Recent Labs  Lab 09/07/19 2109 09/09/19 0243 09/11/19 0124 09/12/19 0340  AST 34 23 53* 48*  ALT 18 16  26 33  ALKPHOS 64 60 59 51  BILITOT 0.4 0.6 0.4 0.4  PROT 7.4 7.5 7.2 7.7  ALBUMIN 3.3* 3.3* 3.1* 3.3*   No results for input(s): LIPASE, AMYLASE in the last 168 hours. No results for input(s): AMMONIA in the last 168 hours. Coagulation Profile: No results for input(s): INR, PROTIME in the last 168 hours. Cardiac Enzymes: No results for input(s): CKTOTAL, CKMB, CKMBINDEX, TROPONINI in the last 168 hours. BNP (last 3 results) No results for input(s): PROBNP in the last 8760 hours. HbA1C: No results for input(s): HGBA1C in the last 72 hours. CBG: No results for input(s): GLUCAP in the last 168 hours. Lipid Profile: No results for input(s): CHOL, HDL, LDLCALC, TRIG, CHOLHDL, LDLDIRECT in the last 72 hours. Thyroid Function Tests: No results for input(s): TSH, T4TOTAL, FREET4, T3FREE, THYROIDAB in the last 72 hours. Anemia Panel: No results for input(s): VITAMINB12, FOLATE, FERRITIN, TIBC, IRON, RETICCTPCT in the last 72 hours. Urine analysis:    Component Value Date/Time   COLORURINE AMBER (A) 09/09/2019 1146   APPEARANCEUR HAZY (A) 09/09/2019 1146   LABSPEC 1.017 09/09/2019  1146   PHURINE 6.0 09/09/2019 1146   GLUCOSEU NEGATIVE 09/09/2019 1146   HGBUR SMALL (A) 09/09/2019 1146   BILIRUBINUR NEGATIVE 09/09/2019 1146   KETONESUR NEGATIVE 09/09/2019 1146   PROTEINUR 30 (A) 09/09/2019 1146   UROBILINOGEN 0.2 08/03/2015 1714   NITRITE NEGATIVE 09/09/2019 1146   LEUKOCYTESUR LARGE (A) 09/09/2019 1146   Recent Results (from the past 240 hour(s))  SARS Coronavirus 2 [facility](Hospital order, Performed in [facility] hospital lab) Nasopharyngeal Nasopharyngeal Swab     Status: Abnormal   Collection Time: 09/07/19 11:56 PM   Specimen: Nasopharyngeal Swab  Result Value Ref Range Status   SARS Coronavirus 2 POSITIVE (A) NEGATIVE Final    Comment: RESULT CALLED TO, READ BACK BY AND VERIFIED WITH: FRICKEY,J @ 0328 ON [id number] BY POTEAT,S (NOTE) If result is NEGATIVE SARS-CoV-2 target nucleic acids are NOT DETECTED. The SARS-CoV-2 RNA is generally detectable in upper and lower  respiratory specimens during the acute phase of infection. The lowest  concentration of SARS-CoV-2 viral copies this assay can detect is 250  copies / mL. A negative result does not preclude SARS-CoV-2 infection  and should not be used as the sole basis for treatment or other  patient management decisions.  A negative result may occur with  improper specimen collection / handling, submission of specimen other  than nasopharyngeal swab, presence of viral mutation(s) within the  areas targeted by this assay, and inadequate number of viral copies  (<250 copies / mL). A negative result must be combined with clinical  observations, patient history, and epidemiological information. If result is POSITIVE SARS-CoV-2 target nucleic acids are DETECTED.  The SARS-CoV-2 RNA is generally detectable in upper and lower  respiratory specimens during the acute phase of infection.  Positive  results are indicative of active infection with SARS-CoV-2.  Clinical  correlation with patient history and other diagnostic  information is  necessary to determine patient infection status.  Positive results do  not rule out bacterial infection or co-infection with other viruses. If result is PRESUMPTIVE POSTIVE SARS-CoV-2 nucleic acids MAY BE PRESENT.   A presumptive positive result was obtained on the submitted specimen  and confirmed on repeat testing.  While 2019 novel coronavirus  (SARS-CoV-2) nucleic acids may be present in the submitted sample  additional confirmatory testing may be necessary for epidemiological  and / or clinical management purposes  to  differentiate between  SARS-CoV-2 and other Sarbecovirus currently known to infect humans.  If clinically indicated additional testing with an alternate test  methodology [phone])  is advised. The SARS-CoV-2 RNA is generally  detectable in upper and lower respiratory specimens during the acute  phase of infection. The expected result is Negative. Fact Sheet for Patients:  StrictlyIdeas.no Fact Sheet for Healthcare Providers: BankingDealers.co.za This test is not yet approved or cleared by the [place] FDA and has been authorized for detection and/or diagnosis of SARS-CoV-2 by FDA under an Emergency Use Authorization (EUA).  This EUA will remain in effect (meaning this test can be used) for the duration of the COVID-19 declaration under Section 564(b)(1) of the Act, 21 U.S.C. section 360bbb-3(b)(1), unless the authorization is terminated or revoked sooner. Performed at [facility], [place] [address] [place], [place] [address]   Culture, Urine     Status: Abnormal   Collection Time: 09/09/19 11:45 AM   Specimen: Urine, Random  Result Value Ref Range Status   Specimen Description   Final    URINE, RANDOM Performed at [place] [address], [place], [place] [address]    Special Requests   Final    NONE Performed at [facility], [place] [address], [place], [place] [address]    Culture >=100,000 COLONIES/mL ALCALIGENES FAECALIS (A)  Final   Report Status 09/12/2019 FINAL  Final   Organism ID, Bacteria ALCALIGENES FAECALIS (A)  Final      Susceptibility   Alcaligenes faecalis - MIC*    CEFEPIME >=64 RESISTANT Resistant     CEFAZOLIN 32 INTERMEDIATE Intermediate     GENTAMICIN 2 SENSITIVE Sensitive     CIPROFLOXACIN >=4 RESISTANT Resistant     IMIPENEM 0.5 SENSITIVE Sensitive     TRIMETH/SULFA >=320 RESISTANT Resistant     * >=100,000 COLONIES/mL ALCALIGENES FAECALIS      Radiology Studies: Dg Chest 1 View  Result Date: 09/11/2019 CLINICAL DATA:  Cough, fever, weakness. covid + EXAM: CHEST  1 VIEW COMPARISON:  09/08/2019 and older exams. FINDINGS: Lungs show interstitial mild hazy airspace opacities the mid to lower lungs evident the left, unchanged from the previous exam consistent with multifocal pneumonia. Additional linear basilar opacities consistent with atelectasis. No new lung abnormalities. No convincing pleural effusion.  No pneumothorax. IMPRESSION: 1. Stable appearance of the lungs compared to the most recent prior study. Hazy lung opacities consistent multifocal pneumonia. Electronically Signed   By: Lajean Manes M.D.   On: 09/11/2019 04:39    Scheduled Meds:  darifenacin  15 mg Oral Daily   dexamethasone  6 mg Oral Q24H   diazepam  5 mg Oral TID   enoxaparin (LOVENOX) injection  30 mg Subcutaneous Q24H   hydrocerin   Topical BID   HYDROmorphone  4 mg Oral TID   pantoprazole  40 mg Oral Daily   pregabalin  75 mg Oral TID   Continuous Infusions:  cefTRIAXone (ROCEPHIN)  IV Stopped (09/11/19 1015)   remdesivir 100 mg in NS 250 mL       LOS: 4 days   Time spent: 35 minutes.  Patrecia Pour, MD [facility] [email or web address] Password [facility] 09/12/2019, 9:24 AM

## 2019-09-12 NOTE — Progress Notes (Signed)
Advocated for and encouraged patient to consider allowing staff to transfer to air mattress, to help off load and assist in healing her pressure injuries.  Patient refused, reporting her scoliosis, back pain, and previous experience of discomfort with air mattress as reasons why she cannot tolerate air mattress.  Nursing communicated that it is necessary that we reposition her from side to side and rotate with pillow support every two hours as well as change her suprapubic cath dressing and pressure injury dressings today.  Patient refused to turn at present, with exception of adjusting pillows to BLE and was hesitant, but agreeable to dressing changes later today. Will continue to encourage and provide turning assistance every two hours despite patient refusal to off-load bottom.

## 2019-09-12 NOTE — Plan of Care (Signed)
Several attempts made this am to change buttock dressing and pt. Deferred each time. States "I like to have it done after my am pain med.". No change in status. NAD noted.

## 2019-09-13 LAB — COMPREHENSIVE METABOLIC PANEL
ALT: 35 U/L (ref 0–44)
AST: 36 U/L (ref 15–41)
Albumin: 3.2 g/dL — ABNORMAL LOW (ref 3.5–5.0)
Alkaline Phosphatase: 46 U/L (ref 38–126)
Anion gap: 10 (ref 5–15)
BUN: 14 mg/dL (ref 6–20)
CO2: 27 mmol/L (ref 22–32)
Calcium: 8.4 mg/dL — ABNORMAL LOW (ref 8.9–10.3)
Chloride: 101 mmol/L (ref 98–111)
Creatinine, Ser: 0.42 mg/dL — ABNORMAL LOW (ref 0.44–1.00)
GFR calc Af Amer: >60 mL/min (ref >60)
GFR calc non Af Amer: >60 mL/min (ref >60)
Glucose, Bld: 120 mg/dL — ABNORMAL HIGH (ref 70–99)
Potassium: 4 mmol/L (ref 3.5–5.1)
Sodium: 138 mmol/L (ref 135–145)
Total Bilirubin: 0.4 mg/dL (ref 0.3–1.2)
Total Protein: 7.6 g/dL (ref 6.5–8.1)

## 2019-09-13 LAB — CBC WITH DIFFERENTIAL/PLATELET
Abs Immature Granulocytes: 0.03 10*3/uL (ref 0.00–0.07)
Basophils Absolute: 0 10*3/uL (ref 0.0–0.1)
Basophils Relative: 0 %
Eosinophils Absolute: 0 10*3/uL (ref 0.0–0.5)
Eosinophils Relative: 0 %
HCT: 38.3 % (ref 36.0–46.0)
Hemoglobin: 12 g/dL (ref 12.0–15.0)
Immature Granulocytes: 1 %
Lymphocytes Relative: 25 %
Lymphs Abs: 0.7 10*3/uL (ref 0.7–4.0)
MCH: 25.2 pg — ABNORMAL LOW (ref 26.0–34.0)
MCHC: 31.3 g/dL (ref 30.0–36.0)
MCV: 80.5 fL (ref 80.0–100.0)
Monocytes Absolute: 0.2 10*3/uL (ref 0.1–1.0)
Monocytes Relative: 6 %
Neutro Abs: 1.9 10*3/uL (ref 1.7–7.7)
Neutrophils Relative %: 68 %
Platelets: 243 10*3/uL (ref 150–400)
RBC: 4.76 MIL/uL (ref 3.87–5.11)
RDW: 15.9 % — ABNORMAL HIGH (ref 11.5–15.5)
WBC: 2.8 10*3/uL — ABNORMAL LOW (ref 4.0–10.5)
nRBC: 0 % (ref 0.0–0.2)

## 2019-09-13 LAB — C-REACTIVE PROTEIN: CRP: 2.4 mg/dL — ABNORMAL HIGH (ref <1.0)

## 2019-09-13 NOTE — Plan of Care (Signed)
No acute changes this shift. VSS. Fall precautions maintained. Call light in reach.   Problem: Education: Goal: Knowledge of risk factors and measures for prevention of condition will improve Outcome: Progressing   Problem: Coping: Goal: Psychosocial and spiritual needs will be supported Outcome: Progressing   Problem: Respiratory: Goal: Will maintain a patent airway Outcome: Progressing Goal: Complications related to the disease process, condition or treatment will be avoided or minimized Outcome: Progressing   Problem: Education: Goal: Knowledge of General Education information will improve Description: Including pain rating scale, medication(s)/side effects and non-pharmacologic comfort measures Outcome: Progressing   Problem: Health Behavior/Discharge Planning: Goal: Ability to manage health-related needs will improve Outcome: Progressing   Problem: Safety: Goal: Ability to remain free from injury will improve Outcome: Progressing   Problem: Skin Integrity: Goal: Risk for impaired skin integrity will decrease Outcome: Progressing

## 2019-09-13 NOTE — Evaluation (Signed)
Occupational Therapy Evaluation Patient Details Name: [name] MRN: [id number] DOB: Feb 20, 1986 Today's Date: 09/13/2019    History of Present Illness 33 y.o. 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 With 1 week of progressive symptoms and onset of similar symptoms in her spouse/caretaker, they presented to the ED 9/16. She was reportedly hypoxic on room air without infiltrate on CXR and SAR-CoV-2 PCR was positive. Both she and her spouse were admitted to [facility] 9/17. Steroids started, though she exhibited no respiratory symptoms, so remdesivir not started.    Clinical Impression   This 33 y/o female presents with the above. PTA pt reports spouse assists with transfers (overall totalA), reports he assists with ADL tasks with pt able to participate in completing UB ADL. Pt presents supine in bed pleasant and willing to participate in therapy session. Pt overall requiring totalA for squat pivot transfers, tolerating OOB to recliner during this session. She requires totalA for LB ADL, setup-minA for self-feeding and grooming ADL. Pt reports feeling more weak/deconditioned compared to her baseline. Pt will benefit from continued acute OT services to further maximize her safety and independence with ADL/mobility. Currently recommend pt return home with spouse assist at time of discharge; though note pt's spouse also with recent admit and d/c home from [facility]. Will need to confirm pt's spouse able to provide adequate assist prior to return home. Will follow.    Follow Up Recommendations  No OT follow up;Supervision/Assistance - 24 hour    Equipment Recommendations  None recommended by OT           Precautions / Restrictions Precautions Precautions: Fall Precaution Comments: baseline incomplete SCI, pressure  wounds at hip Restrictions Weight Bearing Restrictions: No      Mobility Bed Mobility Overal bed mobility: Needs Assistance Bed Mobility: Supine to Sit           General bed mobility comments: pt performing supine>sit EOB with PT assist (OT out of room). pt is able to hook UEs to assist with pulling herself forwards/repositioning throughout session  Transfers Overall transfer level: Needs assistance Equipment used: 1 person hand held assist Transfers: Squat Pivot Transfers     Squat pivot transfers: Total assist;+2 safety/equipment     General transfer comment: use of bed pad to cradle hips, totalA squat pivot to recliner. pt positioned with maximove pad in recliner for nursing staff to assist with transition back to bed    Balance Overall balance assessment: Needs assistance Sitting-balance support: Single extremity supported Sitting balance-Leahy Scale: Poor Sitting balance - Comments: overall reliant on single UE support, LOB with dynamic challenges     Standing balance-Leahy Scale: Zero                             ADL either performed or assessed with clinical judgement   ADL Overall ADL's : Needs assistance/impaired Eating/Feeding: Set up;Sitting   Grooming: Sitting;Minimal assistance;Set up   Upper Body Bathing: Minimal assistance;Sitting   Lower Body Bathing: Maximal assistance;Total assistance;Sitting/lateral leans;Bed level   Upper Body Dressing : Minimal assistance;Sitting   Lower Body Dressing: Maximal assistance;Total assistance;Sitting/lateral leans;Bed level Lower Body Dressing Details (indicate cue type and reason): totalA to don socks Toilet Transfer: Total assistance;Squat-pivot;+2 for safety/equipment Toilet Transfer Details (indicate cue type and reason): simulated via transfer to  recliner Toileting- Clothing Manipulation and Hygiene: Total assistance       Functional mobility during ADLs: Total assistance;+2 for  safety/equipment(squat pivot to recliner)                           Pertinent Vitals/Pain Pain Assessment: Faces Faces Pain Scale: Hurts little more Pain Location: back, reports rods "up and down" her L side Pain Descriptors / Indicators: Tingling;Sore(sensitive) Pain Intervention(s): Monitored during session;Limited activity within patient's tolerance;Repositioned     Hand Dominance Left   Extremity/Trunk Assessment Upper Extremity Assessment Upper Extremity Assessment: RUE deficits/detail;LUE deficits/detail RUE Deficits / Details: shoulder and elbow ROM grossly WFL gravity eliminated, decreased wrist extension and digit flexion RUE Coordination: decreased fine motor LUE Deficits / Details: shoulder and elbow ROM grossly WFL gravity eliminated, decreased digit extension (able to passively extend) LUE Coordination: decreased fine motor   Lower Extremity Assessment Lower Extremity Assessment: Defer to PT evaluation   Cervical / Trunk Assessment Cervical / Trunk Assessment: Other exceptions Cervical / Trunk Exceptions: hx of scoliosis, hx of previous back surgeries    Communication Communication Communication: No difficulties   Cognition Arousal/Alertness: Awake/alert Behavior During Therapy: WFL for tasks assessed/performed Overall Cognitive Status: Within Functional Limits for tasks assessed                                 General Comments: pt very chatty   General Comments  VSS with pt on RA    Exercises Exercises: Other exercises Other Exercises Other Exercises: issued pt level 1 theraband (pt requesting theraband during session) and verbally reviewed HEP, pt return demosntrating few exercises end of session but will benefit from further practice   Shoulder Instructions      Home Living Family/patient expects to be discharged to:: Private residence Living Arrangements: Spouse/significant other Available Help at Discharge: Family;Available 24  hours/day Type of Home: House Home Access: Ramped entrance     Home Layout: One level     Bathroom Shower/Tub: (pt reports she performs bathing at bed level)         Home Equipment: Wheelchair - manual(reports power wheelchair has broken battery)          Prior Functioning/Environment Level of Independence: Needs assistance  Gait / Transfers Assistance Needed: reports spouse assists with transfers, picks her up and carries her/transfers her from one place to another  ADL's / Homemaking Assistance Needed: spouse assists but pt reports she participates/assists as able (increased assist required from spouse for LB portions of bathing/dressing). pt reports she performs bed baths, reports they used to have shower chair but she wasn't able to tolerate sitting on hard surface, unable to afford cushioned shower seat. pt's spouse cuts up her food but she is able to self-feed and hold "[place]" water bottle for drinking. reports she is in school online, has a hand splint to hold stylus for typing, etc             OT Problem List: Decreased strength;Decreased range of motion;Decreased activity tolerance;Impaired balance (sitting and/or standing);Decreased safety awareness;Decreased knowledge of use of DME or AE;Impaired UE functional use;Pain;Cardiopulmonary status limiting activity;Decreased coordination      OT Treatment/Interventions: Self-care/ADL training;Therapeutic exercise;Neuromuscular education;Energy conservation;DME and/or AE instruction;Therapeutic activities;Patient/family education;Balance training    OT Goals(Current goals can be found in the care plan section) Acute Rehab OT Goals Patient Stated Goal: home soon OT Goal Formulation:  With patient Time For Goal Achievement: 09/27/19 Potential to Achieve Goals: Good  OT Frequency: Min 2X/week   Barriers to D/C:            Co-evaluation PT/OT/SLP Co-Evaluation/Treatment: Yes Reason for Co-Treatment: Complexity of the  patient's impairments (multi-system involvement) PT goals addressed during session: Mobility/safety with mobility;Strengthening/ROM OT goals addressed during session: ADL's and self-care      AM-PAC OT "6 Clicks" Daily Activity     Outcome Measure Help from another person eating meals?: A Little Help from another person taking care of personal grooming?: A Little Help from another person toileting, which includes using toliet, bedpan, or urinal?: Total Help from another person bathing (including washing, rinsing, drying)?: A Lot Help from another person to put on and taking off regular upper body clothing?: A Lot Help from another person to put on and taking off regular lower body clothing?: Total 6 Click Score: 12   End of Session Nurse Communication: Mobility status;Need for lift equipment  Activity Tolerance: Patient tolerated treatment well Patient left: in chair;with call bell/phone within reach  OT Visit Diagnosis: Other abnormalities of gait and mobility (R26.89);Muscle weakness (generalized) (M62.81)                Time: 9563-8756 OT Time Calculation (min): 44 min Charges:  OT General Charges $OT Visit: 1 Visit OT Evaluation $OT Eval Moderate Complexity: 1 Mod OT Treatments $Self Care/Home Management : 8-22 mins  Marcy Siren, OT Supplemental Rehabilitation Services Pager [phone] Office [phone]   Orlando Penner 09/13/2019, 1:15 PM

## 2019-09-13 NOTE — Progress Notes (Addendum)
PROGRESS NOTE  Stacy CraftBritany D Lebeau  ZOX:[id number] DOB: 1986-09-08 DOA: 09/07/2019 PCP: [facility]   Brief Narrative: [name] is a 33 y.o. 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 With 1 week of progressive symptoms and onset of similar symptoms in her spouse/caretaker, they presented to the ED 9/16. She was reportedly hypoxic on room air without infiltrate on CXR and SAR-CoV-2 PCR was positive. Both she and her [facility]. Steroids started, though she exhibited no respiratory symptoms, so remdesivir not started. On 9/20, remdesivir started in response to hypoxia and infiltrates on CXR with subsequent improvement in symptoms, inflammatory markers, and hypoxia..   Assessment & Plan: Principal Problem:   COVID-19 virus infection Active Problems:   Pressure injury of skin   Recurrent UTI   Chronic incomplete spastic tetraplegia (HCC)   Chronic suprapubic catheter (HCC)   Pharyngitis   COVID-19  Acute hypoxic respiratory failure due to covid-19 pneumonia: No hypoxia or respiratory complaints at presentation. Initial CXR with no infiltrate, some opacity on repeat CXR which has developed more definitively into multifocal PNA as of 9/20. Atelectasis likely also contributing to hypoxia. Fever overnight with rising CRP and declining PCT and persistent lymphopenia.  - Continue steroids x10 days - Continue remdesivir x5 days (9/20 - 9/24) - Trend inflammatory markers. CRP up 6 > 8 despite several days of steroids and antibiotics so remdesivir started and CRP down. - Continue airborne, contact precautions. PPE including surgical gown, gloves, cap, shoe covers, and CAPR used during this encounter in a negative pressure room.  - Maintain euvolemia/net negative.  - Added  antitussive.  UTI, neurogenic bladder, chronic suprapubic catheter: PCT declined significantly/normalized with ceftriaxone. Has defervesced, etc. despite having urine culture grow Alcaligenes faecalis. Therefore, this is suspected to be colonization. Stop CTX.  - Continue formulary equivalent of vesicare - Continue catheter with plans to exchange next week on schedule.  Spastic incomplete quadriplegia s/p spinal cord injury at age 33: Some use of hands, contractures noted.  - Continue home medications as she was taking including valium, dilaudid, lyrica TID.  - Turn q2h, assist as needed for hygiene  Chronic pain s/p significant spinal surgeries:  - Continue home regimen which includes lyrica, dilaudid and prn ibuprofen.   Anxiety:  - Continue valium. Ordered as she takes it at home chronically.   Pancytopenia: Suspected to be due to covid-19 infection.  - Trend. Stable. Improving. Platelets and hgb normalized.  Pressure injuries as listed below: All POA.  - Offload as able - WOC consulted - Continue regular follow up with wound care center (chronic patient there)  RN Pressure Injury Documentation: Pressure Injury 01/19/18 Stage II -  Partial thickness loss of dermis presenting as a shallow open ulcer with a red, pink wound bed without slough. (Active)  01/19/18 0500  Location: Hip  Location Orientation: Anterior;Left;Proximal;Upper  Staging: Stage II -  Partial thickness loss of dermis presenting as a shallow open ulcer with a red, pink wound bed without slough.  Wound Description (Comments):   Present on Admission: Yes     Pressure Injury 07/09/18 Stage IV - Full thickness tissue loss with exposed bone, tendon or muscle. (Active)  07/09/18 1600  Location: Sacrum  Location Orientation: Mid  Staging: Stage IV - Full thickness tissue loss with exposed bone, tendon or  muscle.  Wound Description (Comments):   Present on Admission: Yes     Pressure Injury 07/09/18 Stage IV - Full  thickness tissue loss with exposed bone, tendon or muscle. (Active)  07/09/18 2030  Location: Ischial tuberosity  Location Orientation: Left  Staging: Stage IV - Full thickness tissue loss with exposed bone, tendon or muscle.  Wound Description (Comments):   Present on Admission: Yes     Pressure Injury 07/09/18 Stage III -  Full thickness tissue loss. Subcutaneous fat may be visible but bone, tendon or muscle are NOT exposed. labial folds, perineum (Active)  07/09/18 2030  Location: Other (Comment)  Location Orientation: Left  Staging: Stage III -  Full thickness tissue loss. Subcutaneous fat may be visible but bone, tendon or muscle are NOT exposed.  Wound Description (Comments): labial folds, perineum  Present on Admission: Yes     Pressure Injury 07/09/18 Stage IV - Full thickness tissue loss with exposed bone, tendon or muscle. (Active)  07/09/18 2030  Location: Ischial tuberosity  Location Orientation: Right  Staging: Stage IV - Full thickness tissue loss with exposed bone, tendon or muscle.  Wound Description (Comments):   Present on Admission: Yes     Pressure Injury 07/09/18 Stage III -  Full thickness tissue loss. Subcutaneous fat may be visible but bone, tendon or muscle are NOT exposed. per patient area of extra skin  (Active)  07/09/18 2030  Location: Hip  Location Orientation: Left  Staging: Stage III -  Full thickness tissue loss. Subcutaneous fat may be visible but bone, tendon or muscle are NOT exposed.  Wound Description (Comments): per patient area of extra skin   Present on Admission: Yes     Pressure Injury 07/09/18 per patient pressure wound from where laptop rests in her lap  (Active)  07/09/18 2030  Location: Thigh  Location Orientation: Right  Staging:   Wound Description (Comments): per patient pressure wound from where laptop rests in her lap   Present on Admission: Yes     Pressure Injury 07/09/18 Deep Tissue Injury - Purple or maroon localized area  of discolored intact skin or blood-filled blister due to damage of underlying soft tissue from pressure and/or shear. (Active)  07/09/18 1734  Location: Heel  Location Orientation: Left  Staging: Deep Tissue Injury - Purple or maroon localized area of discolored intact skin or blood-filled blister due to damage of underlying soft tissue from pressure and/or shear.  Wound Description (Comments):   Present on Admission: Yes     Pressure Injury 07/12/18 Deep Tissue Injury - Purple or maroon localized area of discolored intact skin or blood-filled blister due to damage of underlying soft tissue from pressure and/or shear. (Active)  07/12/18 1737  Location: Heel  Location Orientation: Right  Staging: Deep Tissue Injury - Purple or maroon localized area of discolored intact skin or blood-filled blister due to damage of underlying soft tissue from pressure and/or shear.  Wound Description (Comments):   Present on Admission: Yes     Pressure Injury 09/08/19 Ischial tuberosity Left Stage III -  Full thickness tissue loss. Subcutaneous fat may be visible but bone, tendon or muscle are NOT exposed. red, draining (Active)  09/08/19 1913  Location: Ischial tuberosity  Location Orientation: Left  Staging: Stage III -  Full thickness tissue loss. Subcutaneous fat may be visible but bone, tendon or muscle are NOT exposed.  Wound Description (Comments): red, draining  Present on Admission: Yes     Pressure Injury Ischial tuberosity Right  Stage III -  Full thickness tissue loss. Subcutaneous fat may be visible but bone, tendon or muscle are NOT exposed. red, draining (Active)     Location: Ischial tuberosity  Location Orientation: Right  Staging: Stage III -  Full thickness tissue loss. Subcutaneous fat may be visible but bone, tendon or muscle are NOT exposed.  Wound Description (Comments): red, draining  Present on Admission: Yes     Pressure Injury 09/08/19 Sacrum Stage I -  Intact skin with  non-blanchable redness of a localized area usually over a bony prominence. pink, unblanchable (Active)  09/08/19 1914  Location: Sacrum  Location Orientation:   Staging: Stage I -  Intact skin with non-blanchable redness of a localized area usually over a bony prominence.  Wound Description (Comments): pink, unblanchable  Present on Admission: Yes   DVT prophylaxis: Lovenox 30mg , Pt also has IVC filter in place. Code Status: Full Family Communication: Significant other at bedside. Disposition Plan: If clinically improving, can DC home after final dose of remdesivir 9/24.    Consultants:   None  Procedures:   None  Antimicrobials:  Ceftriaxone 9/18 >>   Remdesivir 9/20 - 9/24  Subjective: No new complaints. Sat up in chair for several hours yesterday, up w/lift with PT and OT. Not SOB, no chest pain.   Objective: Vitals:   09/12/19 2000 09/13/19 0448 09/13/19 0821 09/13/19 0822  BP: 118/83 (!) 129/93 119/72   Pulse: 78  (!) 52   Resp: 16 16    Temp: 98.8 F (37.1 C) 98 F (36.7 C) 98 F (36.7 C)   TempSrc: Oral Oral Oral   SpO2: 95%  91% 91%  Weight:      Height:        Intake/Output Summary (Last 24 hours) at 09/13/2019 1458 Last data filed at 09/13/2019 1100 Gross per 24 hour  Intake 590 ml  Output 150 ml  Net 440 ml   Filed Weights   09/08/19 1628  Weight: 41.7 kg   Gen: Frail 32yo female in no distress Pulm: Nonlabored breathing room air. Clear. CV: Regular rate and rhythm. No murmur, rub, or gallop. No JVD, no dependent edema. GI: Abdomen soft, non-tender, non-distended, with normoactive bowel sounds.  Ext: Warm, no deformities Skin: No new rashes, lesions or ulcers on visualized skin. Neuro: Alert and oriented. Stable, no new focal neurological deficits. Psych: Judgement and insight appear fair. Mood euthymic & affect congruent. Behavior is appropriate.   Data Reviewed: I have personally reviewed following labs and imaging studies  CBC: Recent  Labs  Lab 09/07/19 2109 09/10/19 0123 09/11/19 0124 09/12/19 0340 09/13/19 0230  WBC 2.0* 2.5* 1.9* 1.9* 2.8*  NEUTROABS 1.3* 2.1 1.5* 1.3* 1.9  HGB 11.9* 11.3* 11.1* 12.2 12.0  HCT 39.3 37.5 36.4 39.1 38.3  MCV 83.6 83.5 82.2 81.3 80.5  PLT 105* 146* 153 193 243   Basic Metabolic Panel: Recent Labs  Lab 09/07/19 2109 09/09/19 0243 09/11/19 0124 09/12/19 0340 09/13/19 0230  NA 136 138 138 138 138  K 3.8 4.0 4.5 4.4 4.0  CL 98 101 101 100 101  CO2 28 28 28 29 27   GLUCOSE 139* 138* 112* 111* 120*  BUN 5* 7 11 14 14   CREATININE 0.50 0.41* 0.49 0.35* 0.42*  CALCIUM 7.8* 8.1* 8.3* 8.7* 8.4*   GFR: Estimated Creatinine Clearance: 57.9 mL/min (A) (by C-G formula based on SCr of 0.42 mg/dL (L)). Liver Function Tests: Recent Labs  Lab 09/07/19 2109 09/09/19 0243 09/11/19 0124 09/12/19  0340 09/13/19 0230  AST 34 23 53* 48* 36  ALT 18 16 26  33 35  ALKPHOS 64 60 59 51 46  BILITOT 0.4 0.6 0.4 0.4 0.4  PROT 7.4 7.5 7.2 7.7 7.6  ALBUMIN 3.3* 3.3* 3.1* 3.3* 3.2*   No results for input(s): LIPASE, AMYLASE in the last 168 hours. No results for input(s): AMMONIA in the last 168 hours. Coagulation Profile: No results for input(s): INR, PROTIME in the last 168 hours. Cardiac Enzymes: No results for input(s): CKTOTAL, CKMB, CKMBINDEX, TROPONINI in the last 168 hours. BNP (last 3 results) No results for input(s): PROBNP in the last 8760 hours. HbA1C: No results for input(s): HGBA1C in the last 72 hours. CBG: No results for input(s): GLUCAP in the last 168 hours. Lipid Profile: No results for input(s): CHOL, HDL, LDLCALC, TRIG, CHOLHDL, LDLDIRECT in the last 72 hours. Thyroid Function Tests: No results for input(s): TSH, T4TOTAL, FREET4, T3FREE, THYROIDAB in the last 72 hours. Anemia Panel: No results for input(s): VITAMINB12, FOLATE, FERRITIN, TIBC, IRON, RETICCTPCT in the last 72 hours. Urine analysis:    Component Value Date/Time   COLORURINE AMBER (A) 09/09/2019 1146    APPEARANCEUR HAZY (A) 09/09/2019 1146   LABSPEC 1.017 09/09/2019 1146   PHURINE 6.0 09/09/2019 1146   GLUCOSEU NEGATIVE 09/09/2019 1146   HGBUR SMALL (A) 09/09/2019 1146   BILIRUBINUR NEGATIVE 09/09/2019 1146   KETONESUR NEGATIVE 09/09/2019 1146   PROTEINUR 30 (A) 09/09/2019 1146   UROBILINOGEN 0.2 08/03/2015 1714   NITRITE NEGATIVE 09/09/2019 1146   LEUKOCYTESUR LARGE (A) 09/09/2019 1146   Recent Results (from the past 240 hour(s))  SARS Coronavirus 2 [facility] order, Performed in [facility] lab) Nasopharyngeal Nasopharyngeal Swab     Status: Abnormal   Collection Time: 09/07/19 11:56 PM   Specimen: Nasopharyngeal Swab  Result Value Ref Range Status   SARS Coronavirus 2 POSITIVE (A) NEGATIVE Final    Comment: RESULT CALLED TO, READ BACK BY AND VERIFIED WITH: FRICKEY,J @ 0328 ON [id number] BY POTEAT,S (NOTE) If result is NEGATIVE SARS-CoV-2 target nucleic acids are NOT DETECTED. The SARS-CoV-2 RNA is generally detectable in upper and lower  respiratory specimens during the acute phase of infection. The lowest  concentration of SARS-CoV-2 viral copies this assay can detect is 250  copies / mL. A negative result does not preclude SARS-CoV-2 infection  and should not be used as the sole basis for treatment or other  patient management decisions.  A negative result may occur with  improper specimen collection / handling, submission of specimen other  than nasopharyngeal swab, presence of viral mutation(s) within the  areas targeted by this assay, and inadequate number of viral copies  (<250 copies / mL). A negative result must be combined with clinical  observations, patient history, and epidemiological information. If result is POSITIVE SARS-CoV-2 target nucleic acids are DETECTED.  The SARS-CoV-2 RNA is generally detectable in upper and lower  respiratory specimens during the acute phase of infection.  Positive  results are indicative of active infection with SARS-CoV-2.   Clinical  correlation with patient history and other diagnostic information is  necessary to determine patient infection status.  Positive results do  not rule out bacterial infection or co-infection with other viruses. If result is PRESUMPTIVE POSTIVE SARS-CoV-2 nucleic acids MAY BE PRESENT.   A presumptive positive result was obtained on the submitted specimen  and confirmed on repeat testing.  While 2019 novel coronavirus  (SARS-CoV-2) nucleic acids may be present in the submitted  sample  additional confirmatory testing may be necessary for epidemiological  and / or clinical management purposes  to differentiate between  SARS-CoV-2 and other Sarbecovirus currently known to infect humans.  If clinically indicated additional testing with an alternate test  methodology [phone])  is advised. The SARS-CoV-2 RNA is generally  detectable in upper and lower respiratory specimens during the acute  phase of infection. The expected result is Negative. Fact Sheet for Patients:  BoilerBrush.com.cy Fact Sheet for Healthcare Providers: https://pope.com/ This test is not yet approved or cleared by the [place] FDA and has been authorized for detection and/or diagnosis of SARS-CoV-2 by FDA under an Emergency Use Authorization (EUA).  This EUA will remain in effect (meaning this test can be used) for the duration of the COVID-19 declaration under Section 564(b)(1) of the Act, 21 U.S.C. section 360bbb-3(b)(1), unless the authorization is terminated or revoked sooner. Performed at [facility], [address], [place], [place] [address]   Culture, Urine     Status: Abnormal   Collection Time: 09/09/19 11:45 AM   Specimen: Urine, Random  Result Value Ref Range Status   Specimen Description   Final    URINE, RANDOM Performed at [facility], [address], [place], [place] [address]    Special Requests    Final    NONE Performed at [facility], [address] [place], [place] [address]    Culture >=100,000 COLONIES/mL ALCALIGENES FAECALIS (A)  Final   Report Status 09/12/2019 FINAL  Final   Organism ID, Bacteria ALCALIGENES FAECALIS (A)  Final      Susceptibility   Alcaligenes faecalis - MIC*    CEFEPIME >=64 RESISTANT Resistant     CEFAZOLIN 32 INTERMEDIATE Intermediate     GENTAMICIN 2 SENSITIVE Sensitive     CIPROFLOXACIN >=4 RESISTANT Resistant     IMIPENEM 0.5 SENSITIVE Sensitive     TRIMETH/SULFA >=320 RESISTANT Resistant     * >=100,000 COLONIES/mL ALCALIGENES FAECALIS      Radiology Studies: No results found.  Scheduled Meds: . darifenacin  15 mg Oral Daily  . dexamethasone  6 mg Oral Q24H  . diazepam  5 mg Oral TID  . enoxaparin (LOVENOX) injection  30 mg Subcutaneous Q24H  . hydrocerin   Topical BID  . HYDROmorphone  4 mg Oral TID  . nystatin   Topical TID  . pantoprazole  40 mg Oral Daily  . pregabalin  75 mg Oral TID   Continuous Infusions: . remdesivir 100 mg in NS 250 mL 100 mg (09/13/19 0839)     LOS: 5 days   Time spent: 25 minutes.  Tyrone Nine, MD [facility] [email or web address] Password [facility] 09/13/2019, 2:58 PM

## 2019-09-13 NOTE — Evaluation (Signed)
Physical Therapy Evaluation Patient Details Name: [name] MRN: [id number] DOB: [birth year] Today's Date: 09/13/2019   History of Present Illness  33 y.o. 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 With 1 week of progressive symptoms and onset of similar symptoms in her spouse/caretaker, they presented to the ED 9/16. She was reportedly hypoxic on room air without infiltrate on CXR and SAR-CoV-2 PCR was positive. Both she and her spouse were admitted to [facility] 9/17. Steroids started, though she exhibited no respiratory symptoms, so remdesivir not started.   Clinical Impression    Pt presents with decreased ROM in BLE, decreased strength, decreased balance and coordination also safety, independence and activity tolerance. PLOF was living with spouse who was helping with all ADLs and IADLs, she states she does not wish to become weaker while in hospital. Currently she is needing max a x 2 to complete most functional mobility/activities, skilled tx could improve on above deficits.     Follow Up Recommendations Home health PT    Equipment Recommendations       Recommendations for Other Services       Precautions / Restrictions Precautions Precautions: Fall Precaution Comments: baseline incomplete SCI, pressure wounds at hip Restrictions Weight Bearing Restrictions: No      Mobility  Bed Mobility Overal bed mobility: Needs Assistance Bed Mobility: Supine to Sit     Supine to sit: Total assist     General bed mobility comments: total A to move from supine (HOB elevated) to sitting EOB  Transfers Overall transfer level: Needs assistance Equipment used: None Transfers: Squat Pivot Transfers     Squat pivot transfers: Total assist     General transfer comment: use of bed pad to cradle  hips, totalA squat pivot to recliner. pt positioned with maximove pad in recliner for nursing staff to assist with transition back to bed  Ambulation/Gait                Stairs            Wheelchair Mobility    Modified Rankin (Stroke Patients Only)       Balance Overall balance assessment: Needs assistance Sitting-balance support: No upper extremity supported;Single extremity supported Sitting balance-Leahy Scale: Fair Sitting balance - Comments: with fatigue needs single limb support but prior to this was able to sit unsupported Postural control: Posterior lean   Standing balance-Leahy Scale: Zero Standing balance comment: total a to stand, minimally bears weight through BLE                             Pertinent Vitals/Pain Pain Assessment: Faces Faces Pain Scale: Hurts little more Pain Location: back, reports rods "up and down" her L side Pain Descriptors / Indicators: Tingling;Sore Pain Intervention(s): Monitored during session;Repositioned    Home Living Family/patient expects to be discharged to:: Private residence Living Arrangements: Spouse/significant other Available Help at Discharge: Family;Available 24 hours/day Type of Home: House Home Access: Ramped entrance     Home Layout: One level Home Equipment: Wheelchair - manual Additional Comments: states takes bed baths unable to sit on shower chair sec to pain    Prior Function Level of Independence: Needs assistance   Gait / Transfers Assistance Needed: reports spouse assists with transfers, picks her up and carries her/transfers her  from one place to another   ADL's / Homemaking Assistance Needed: spouse assists but pt reports she participates/assists as able (increased assist required from spouse for LB portions of bathing/dressing). pt reports she performs bed baths, reports they used to have shower chair but she wasn't able to tolerate sitting on hard surface, unable to afford  cushioned shower seat. pt's spouse cuts up her food but she is able to self-feed and hold "[place]" water bottle for drinking. reports she is in school online, has a hand splint to hold stylus for typing, etc   Comments: spouse assists with ADLs and IADLs, Pt states able to use laptop and home schools     Hand Dominance   Dominant Hand: Left    Extremity/Trunk Assessment   Upper Extremity Assessment Upper Extremity Assessment: Defer to OT evaluation RUE Deficits / Details: shoulder and elbow ROM grossly WFL gravity eliminated, decreased wrist extension and digit flexion RUE Coordination: decreased fine motor LUE Deficits / Details: shoulder and elbow ROM grossly WFL gravity eliminated, decreased digit extension (able to passively extend) LUE Coordination: decreased fine motor    Lower Extremity Assessment Lower Extremity Assessment: Generalized weakness;RLE deficits/detail;LLE deficits/detail RLE Deficits / Details: limited to trace movements actively, BLE IR at hips, knock kneed, foot drop in B feet noted RLE Sensation: WNL RLE Coordination: decreased fine motor;decreased gross motor LLE Deficits / Details: limited to trace movements actively, BLE IR at hips, knock kneed, foot drop in B feet noted LLE Sensation: WNL LLE Coordination: decreased fine motor;decreased gross motor    Cervical / Trunk Assessment Cervical / Trunk Assessment: Other exceptions Cervical / Trunk Exceptions: hx of scoliosis, hx of previous back surgeries   Communication   Communication: No difficulties  Cognition Arousal/Alertness: Awake/alert Behavior During Therapy: WFL for tasks assessed/performed Overall Cognitive Status: Within Functional Limits for tasks assessed                                 General Comments: pt very chatty      General Comments General comments (skin integrity, edema, etc.): has covered area on sacral area and L medial knee    Exercises Other  Exercises Other Exercises: issued pt level 1 theraband (pt requesting theraband during session) and verbally reviewed HEP, pt return demosntrating few exercises end of session but will benefit from further practice   Assessment/Plan    PT Assessment Patient needs continued PT services  PT Problem List Decreased strength;Decreased range of motion;Decreased activity tolerance;Decreased balance;Decreased mobility;Decreased coordination       PT Treatment Interventions Therapeutic activities;Therapeutic exercise;Balance training;Neuromuscular re-education;Patient/family education    PT Goals (Current goals can be found in the Care Plan section)  Acute Rehab PT Goals Patient Stated Goal: increase strength and go home with spouse PT Goal Formulation: With patient Time For Goal Achievement: 09/27/19 Potential to Achieve Goals: Fair Additional Goals Additional Goal #1: Pt will be able to roll L/R with mod a, in order to pressure relief and preserve skin integrity but also be able to get in/out bed. Additional Goal #2: Pt will be able to tolerate 15 mins of BLE exercises, AAROM/AROM, in order to increase ROM and strength in BLE.    Frequency Min 3X/week   Barriers to discharge Other (comment)(spouse/caregiver was also hospitalized but he went home )      Co-evaluation PT/OT/SLP Co-Evaluation/Treatment: Yes Reason for Co-Treatment: Complexity of the patient's impairments (multi-system involvement) PT goals  addressed during session: Mobility/safety with mobility;Strengthening/ROM OT goals addressed during session: ADL's and self-care       AM-PAC PT "6 Clicks" Mobility  Outcome Measure Help needed turning from your back to your side while in a flat bed without using bedrails?: Total Help needed moving from lying on your back to sitting on the side of a flat bed without using bedrails?: Total Help needed moving to and from a bed to a chair (including a wheelchair)?: Total Help needed  standing up from a chair using your arms (e.g., wheelchair or bedside chair)?: Total Help needed to walk in hospital room?: Total Help needed climbing 3-5 steps with a railing? : Total 6 Click Score: 6    End of Session   Activity Tolerance: Treatment limited secondary to medical complications (Comment);Patient limited by lethargy;Patient limited by fatigue Patient left: in chair;with call bell/phone within reach Nurse Communication: Mobility status;Need for lift equipment PT Visit Diagnosis: Muscle weakness (generalized) (M62.81);Pain Pain - part of body: (back)    Time: 3532-9924 PT Time Calculation (min) (ACUTE ONLY): 36 min   Charges:   PT Evaluation $PT Eval High Complexity: 1 High          Drema Pry, PT   Freddi Starr 09/13/2019, 1:37 PM

## 2019-09-14 LAB — CBC WITH DIFFERENTIAL/PLATELET
Abs Immature Granulocytes: 0.04 10*3/uL (ref 0.00–0.07)
Basophils Absolute: 0 10*3/uL (ref 0.0–0.1)
Basophils Relative: 0 %
Eosinophils Absolute: 0 10*3/uL (ref 0.0–0.5)
Eosinophils Relative: 0 %
HCT: 38.6 % (ref 36.0–46.0)
Hemoglobin: 11.9 g/dL — ABNORMAL LOW (ref 12.0–15.0)
Immature Granulocytes: 1 %
Lymphocytes Relative: 16 %
Lymphs Abs: 0.6 10*3/uL — ABNORMAL LOW (ref 0.7–4.0)
MCH: 25 pg — ABNORMAL LOW (ref 26.0–34.0)
MCHC: 30.8 g/dL (ref 30.0–36.0)
MCV: 81.1 fL (ref 80.0–100.0)
Monocytes Absolute: 0.2 10*3/uL (ref 0.1–1.0)
Monocytes Relative: 5 %
Neutro Abs: 2.8 10*3/uL (ref 1.7–7.7)
Neutrophils Relative %: 78 %
Platelets: 266 10*3/uL (ref 150–400)
RBC: 4.76 MIL/uL (ref 3.87–5.11)
RDW: 16 % — ABNORMAL HIGH (ref 11.5–15.5)
WBC: 3.6 10*3/uL — ABNORMAL LOW (ref 4.0–10.5)
nRBC: 0 % (ref 0.0–0.2)

## 2019-09-14 LAB — COMPREHENSIVE METABOLIC PANEL
ALT: 39 U/L (ref 0–44)
AST: 34 U/L (ref 15–41)
Albumin: 3 g/dL — ABNORMAL LOW (ref 3.5–5.0)
Alkaline Phosphatase: 45 U/L (ref 38–126)
Anion gap: 9 (ref 5–15)
BUN: 14 mg/dL (ref 6–20)
CO2: 26 mmol/L (ref 22–32)
Calcium: 8.2 mg/dL — ABNORMAL LOW (ref 8.9–10.3)
Chloride: 101 mmol/L (ref 98–111)
Creatinine, Ser: 0.48 mg/dL (ref 0.44–1.00)
GFR calc Af Amer: >60 mL/min (ref >60)
GFR calc non Af Amer: >60 mL/min (ref >60)
Glucose, Bld: 139 mg/dL — ABNORMAL HIGH (ref 70–99)
Potassium: 4.2 mmol/L (ref 3.5–5.1)
Sodium: 136 mmol/L (ref 135–145)
Total Bilirubin: 0.3 mg/dL (ref 0.3–1.2)
Total Protein: 6.9 g/dL (ref 6.5–8.1)

## 2019-09-14 LAB — C-REACTIVE PROTEIN: CRP: 1.3 mg/dL — ABNORMAL HIGH (ref <1.0)

## 2019-09-14 NOTE — Progress Notes (Addendum)
Spoke with Marissa RN from [place] who is to come perform suprapubic catheter exchange this afternoon.   1430 suprapubic catheter exchange performed by [facility] RN.

## 2019-09-14 NOTE — Progress Notes (Signed)
Physical Therapy Treatment Patient Details Name: [name] MRN: [id number] DOB: Mar 29, 1986 Today's Date: 09/14/2019    History of Present Illness 33 y.o. 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 With 1 week of progressive symptoms and onset of similar symptoms in her spouse/caretaker, they presented to the ED 9/16. She was reportedly hypoxic on room air without infiltrate on CXR and SAR-CoV-2 PCR was positive. Both she and her spouse were admitted to [facility] 9/17. Steroids started, though she exhibited no respiratory symptoms, so remdesivir not started.     PT Comments    Patient reports she has gotten weaker during her illness. Reports she helps with bed mobility, bathing/dressing upper body, and practices standing with her husband. Sounds like she uses extensor tone to "lock" her legs when standing. Patient has had numerous bad experiences with therapists over the years, however agrees she is not at her baseline and agrees to HHPT. She specifically wants to try to have the same PT she had before (see below).    Follow Up Recommendations  Home health PT( pt wants [facility], Jocelyn Lamer, PT)     Equipment Recommendations  None recommended by PT    Recommendations for Other Services       Precautions / Restrictions Precautions Precautions: Fall Precaution Comments: baseline incomplete SCI, pressure wounds at hip Restrictions Weight Bearing Restrictions: No    Mobility  Bed Mobility Overal bed mobility: Needs Assistance Bed Mobility: Rolling;Sit to Supine;Sidelying to Sit Rolling: Mod assist Sidelying to sit: Max assist;HOB elevated   Sit to supine: Total assist   General bed mobility comments: pt "hooks" PTs arm to intiate rolling to either side, once she can reach rail she hooks  arm on rail; assist for lower body; side to sit she can assist with kicking legs off bed and with pushing up to sit;  Transfers                 General transfer comment: not attempted with +1 assist due to height of bed (pt is 4'8" and by the time her feet reach the floor she is in weight-bearing)  Ambulation/Gait                 Stairs             Wheelchair Mobility    Modified Rankin (Stroke Patients Only)       Balance Overall balance assessment: Needs assistance Sitting-balance support: No upper extremity supported;Single extremity supported;Feet unsupported Sitting balance-Leahy Scale: Fair Sitting balance - Comments: EOB x 10 minutes; worked on lean on forearm to each side (required min-mod assist to return to upright sitting), otherwise supervision to sit eOB                                    Cognition Arousal/Alertness: Awake/alert Behavior During Therapy: WFL for tasks assessed/performed Overall Cognitive Status: Within Functional Limits for tasks assessed                                 General Comments: pt very talkative re: her multiple surgeries and all the therapy she's had and things she has tried      Exercises  Other Exercises Other Exercises: AAROM-PROM ankles, knees, and hips for 5 reps each plane of motion. Pt with stronger knee extension on right (in sitting) however remains 2-); PROM for ROM into ankle DF, hip/knee flexion, and fully supine hip/knee extension    General Comments        Pertinent Vitals/Pain Pain Assessment: 0-10 Pain Score: 10-Worst pain ever Pain Location: back, reports rods "up and down" her L side and 45 minutes overdo for her pain medication Pain Descriptors / Indicators: Tingling;Sore Pain Intervention(s): Limited activity within patient's tolerance;Monitored during session;RN gave pain meds during session;Repositioned    Home Living                      Prior  Function            PT Goals (current goals can now be found in the care plan section) Acute Rehab PT Goals Patient Stated Goal: increase strength and go home with spouse Time For Goal Achievement: 09/27/19 Potential to Achieve Goals: Fair Progress towards PT goals: Progressing toward goals    Frequency    Min 3X/week      PT Plan Current plan remains appropriate    Co-evaluation              AM-PAC PT "6 Clicks" Mobility   Outcome Measure  Help needed turning from your back to your side while in a flat bed without using bedrails?: Total Help needed moving from lying on your back to sitting on the side of a flat bed without using bedrails?: Total Help needed moving to and from a bed to a chair (including a wheelchair)?: Total Help needed standing up from a chair using your arms (e.g., wheelchair or bedside chair)?: Total Help needed to walk in hospital room?: Total Help needed climbing 3-5 steps with a railing? : Total 6 Click Score: 6    End of Session   Activity Tolerance: Patient tolerated treatment well Patient left: with call bell/phone within reach;in bed Nurse Communication: Mobility status;Need for lift equipment PT Visit Diagnosis: Muscle weakness (generalized) (M62.81);Pain     Time: 7673-4193 PT Time Calculation (min) (ACUTE ONLY): 50 min  Charges:  $Therapeutic Exercise: 23-37 mins $Therapeutic Activity: 8-22 mins                       Barry Brunner, PT       [name] 09/14/2019, 12:03 PM

## 2019-09-14 NOTE — Progress Notes (Signed)
PROGRESS NOTE  [name]  DTO:[id number] DOB: 29-Sep-1986 DOA: 09/07/2019 PCP: [facility]   Brief Narrative:   [name] is a 33 y.o. 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 With 1 week of progressive symptoms and onset of similar symptoms in her spouse/caretaker, they presented to the ED 9/16. She was reportedly hypoxic on room air without infiltrate on CXR and SAR-CoV-2 PCR was positive. Both she and her spouse were admitted to [facility] 9/17. Steroids started, though she exhibited no respiratory symptoms, so remdesivir not started. On 9/20, remdesivir started in response to hypoxia and infiltrates on CXR with subsequent improvement in symptoms, inflammatory markers, and hypoxia..   Subjective: No new complaints.  She denies any chest pain, fever or shortness of breath Assessment & Plan: Principal Problem:   COVID-19 virus infection Active Problems:   Pressure injury of skin   Recurrent UTI   Chronic incomplete spastic tetraplegia (HCC)   Chronic suprapubic catheter (HCC)   Pharyngitis   COVID-19  Acute hypoxic respiratory failure due to covid-19 pneumonia: - No hypoxia or respiratory complaints at presentation. Initial CXR with no infiltrate, some opacity on repeat CXR which has developed more definitively into multifocal PNA as of 9/20. Atelectasis likely also contributing to hypoxia. Fever overnight with rising CRP and declining PCT and persistent lymphopenia.  - Continue steroids x10 days - Continue remdesivir x5 days (9/20 - 9/24) -Continue to trend inflammatory markers, CRP trending down which is reassuring - Maintain euvolemia/net negative.  - Added antitussive. COVID-19 Labs  Recent Labs    09/12/19 0340 09/13/19 0230 09/14/19 0340  CRP 5.9* 2.4* 1.3*    Lab  Results  Component Value Date   SARSCOV2NAA POSITIVE (A) 09/07/2019   [place] NEGATIVE 08/30/2019   [place] NEGATIVE 05/07/2019     UTI, neurogenic bladder, chronic suprapubic catheter: -Initially treated with Rocephin, urine culture grow Alcaligenes faecalis.  This is most likely colonization, as her procalcitonin has normalized, leukocytosis has resolved, she is afebrile, nonseptic appearing, Rocephin has been stopped. - Continue formulary equivalent of vesicare -Patient suprapubic Foley catheter was supposed to be changed on Monday, discussed with left, will tract to change suprapubic Foley catheter today before anticipated discharge tomorrow.  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  - Continue home medications as she was taking including valium, dilaudid, lyrica TID.  - Turn q2h, assist as needed for hygiene  Chronic pain s/p significant spinal surgeries:  - Continue home regimen which includes lyrica, dilaudid and prn ibuprofen.   Anxiety:  - Continue valium. Ordered as she takes it at home chronically.   Pancytopenia: Suspected to be due to covid-19 infection.  - Trend. Stable. Improving. Platelets and hgb normalized.  Pressure injuries as listed below: All POA.  - Offload as able - WOC consulted - Continue regular follow up with wound care center (chronic patient there)  RN Pressure Injury Documentation: Pressure Injury 01/19/18 Stage II -  Partial thickness loss of dermis presenting as a shallow open ulcer with a red, pink wound bed without slough. (Active)  01/19/18 0500  Location: Hip  Location Orientation: Anterior;Left;Proximal;Upper  Staging: Stage II -  Partial thickness loss of dermis presenting as a shallow open ulcer with a red, pink wound bed without slough.  Wound Description (Comments):  Present on Admission: Yes     Pressure Injury 07/09/18 Stage IV - Full thickness tissue loss with  exposed bone, tendon or muscle. (Active)  07/09/18 1600  Location: Sacrum  Location Orientation: Mid  Staging: Stage IV - Full thickness tissue loss with exposed bone, tendon or muscle.  Wound Description (Comments):   Present on Admission: Yes     Pressure Injury 07/09/18 Stage IV - Full thickness tissue loss with exposed bone, tendon or muscle. (Active)  07/09/18 2030  Location: Ischial tuberosity  Location Orientation: Left  Staging: Stage IV - Full thickness tissue loss with exposed bone, tendon or muscle.  Wound Description (Comments):   Present on Admission: Yes     Pressure Injury 07/09/18 Stage III -  Full thickness tissue loss. Subcutaneous fat may be visible but bone, tendon or muscle are NOT exposed. labial folds, perineum (Active)  07/09/18 2030  Location: Other (Comment)  Location Orientation: Left  Staging: Stage III -  Full thickness tissue loss. Subcutaneous fat may be visible but bone, tendon or muscle are NOT exposed.  Wound Description (Comments): labial folds, perineum  Present on Admission: Yes     Pressure Injury 07/09/18 Stage IV - Full thickness tissue loss with exposed bone, tendon or muscle. (Active)  07/09/18 2030  Location: Ischial tuberosity  Location Orientation: Right  Staging: Stage IV - Full thickness tissue loss with exposed bone, tendon or muscle.  Wound Description (Comments):   Present on Admission: Yes     Pressure Injury 07/09/18 Stage III -  Full thickness tissue loss. Subcutaneous fat may be visible but bone, tendon or muscle are NOT exposed. per patient area of extra skin  (Active)  07/09/18 2030  Location: Hip  Location Orientation: Left  Staging: Stage III -  Full thickness tissue loss. Subcutaneous fat may be visible but bone, tendon or muscle are NOT exposed.  Wound Description (Comments): per patient area of extra skin   Present on Admission: Yes     Pressure Injury 07/09/18 per patient pressure wound from where laptop rests in  her lap  (Active)  07/09/18 2030  Location: Thigh  Location Orientation: Right  Staging:   Wound Description (Comments): per patient pressure wound from where laptop rests in her lap   Present on Admission: Yes     Pressure Injury 07/09/18 Deep Tissue Injury - Purple or maroon localized area of discolored intact skin or blood-filled blister due to damage of underlying soft tissue from pressure and/or shear. (Active)  07/09/18 1734  Location: Heel  Location Orientation: Left  Staging: Deep Tissue Injury - Purple or maroon localized area of discolored intact skin or blood-filled blister due to damage of underlying soft tissue from pressure and/or shear.  Wound Description (Comments):   Present on Admission: Yes     Pressure Injury 07/12/18 Deep Tissue Injury - Purple or maroon localized area of discolored intact skin or blood-filled blister due to damage of underlying soft tissue from pressure and/or shear. (Active)  07/12/18 1737  Location: Heel  Location Orientation: Right  Staging: Deep Tissue Injury - Purple or maroon localized area of discolored intact skin or blood-filled blister due to damage of underlying soft tissue from pressure and/or shear.  Wound Description (Comments):   Present on Admission: Yes     Pressure Injury 09/08/19 Ischial tuberosity Left Stage III -  Full thickness tissue loss. Subcutaneous fat may be visible but bone, tendon or muscle are NOT exposed. red, draining (Active)  09/08/19 1913  Location:  Ischial tuberosity  Location Orientation: Left  Staging: Stage III -  Full thickness tissue loss. Subcutaneous fat may be visible but bone, tendon or muscle are NOT exposed.  Wound Description (Comments): red, draining  Present on Admission: Yes     Pressure Injury Ischial tuberosity Right Stage III -  Full thickness tissue loss. Subcutaneous fat may be visible but bone, tendon or muscle are NOT exposed. red, draining (Active)     Location: Ischial tuberosity   Location Orientation: Right  Staging: Stage III -  Full thickness tissue loss. Subcutaneous fat may be visible but bone, tendon or muscle are NOT exposed.  Wound Description (Comments): red, draining  Present on Admission: Yes     Pressure Injury 09/08/19 Sacrum Stage I -  Intact skin with non-blanchable redness of a localized area usually over a bony prominence. pink, unblanchable (Active)  09/08/19 1914  Location: Sacrum  Location Orientation:   Staging: Stage I -  Intact skin with non-blanchable redness of a localized area usually over a bony prominence.  Wound Description (Comments): pink, unblanchable  Present on Admission: Yes   DVT prophylaxis: Lovenox 30mg , Pt also has IVC filter in place. Code Status: Full Family Communication: Discussed with patient Disposition Plan: If clinically improving, can DC home after final dose of remdesivir 9/24.    Consultants:   None  Procedures:   None  Antimicrobials:  Ceftriaxone 9/18 >>   Remdesivir 9/20 - 9/24    Objective: Vitals:   09/13/19 1652 09/13/19 1945 09/14/19 0426 09/14/19 0836  BP: 96/63 (!) 87/51 118/69 134/88  Pulse: 83 78 70   Resp:  17    Temp:  98 F (36.7 C) 98 F (36.7 C) 98.6 F (37 C)  TempSrc:  Oral Oral Oral  SpO2: 95% 95% 96%   Weight:      Height:        Intake/Output Summary (Last 24 hours) at 09/14/2019 1402 Last data filed at 09/14/2019 0600 Gross per 24 hour  Intake 240 ml  Output 1200 ml  Net -960 ml   Filed Weights   09/08/19 1628  Weight: 41.7 kg   Awake Alert, Oriented X 3, No new F.N deficits, Normal affect Symmetrical Chest wall movement, Good air movement bilaterally, CTAB RRR,No Gallops,Rubs or new Murmurs, No Parasternal Heave +ve B.Sounds, Abd Soft, No tenderness, No rebound - guarding or rigidity. No Cyanosis, Clubbing ,+1 edema, No new Rash or bruise    Patient was seen and examined with the presence of her nurse 09/10/19  Data Reviewed: I have personally reviewed  following labs and imaging studies  CBC: Recent Labs  Lab 09/10/19 0123 09/11/19 0124 09/12/19 0340 09/13/19 0230 09/14/19 0340  WBC 2.5* 1.9* 1.9* 2.8* 3.6*  NEUTROABS 2.1 1.5* 1.3* 1.9 2.8  HGB 11.3* 11.1* 12.2 12.0 11.9*  HCT 37.5 36.4 39.1 38.3 38.6  MCV 83.5 82.2 81.3 80.5 81.1  PLT 146* 153 193 243 266   Basic Metabolic Panel: Recent Labs  Lab 09/09/19 0243 09/11/19 0124 09/12/19 0340 09/13/19 0230 09/14/19 0340  NA 138 138 138 138 136  K 4.0 4.5 4.4 4.0 4.2  CL 101 101 100 101 101  CO2 28 28 29 27 26   GLUCOSE 138* 112* 111* 120* 139*  BUN 7 11 14 14 14   CREATININE 0.41* 0.49 0.35* 0.42* 0.48  CALCIUM 8.1* 8.3* 8.7* 8.4* 8.2*   GFR: Estimated Creatinine Clearance: 57.9 mL/min (by C-G formula based on SCr of 0.48 mg/dL). Liver Function Tests: Recent  Labs  Lab 09/09/19 0243 09/11/19 0124 09/12/19 0340 09/13/19 0230 09/14/19 0340  AST 23 53* 48* 36 34  ALT 16 26 33 35 39  ALKPHOS 60 59 51 46 45  BILITOT 0.6 0.4 0.4 0.4 0.3  PROT 7.5 7.2 7.7 7.6 6.9  ALBUMIN 3.3* 3.1* 3.3* 3.2* 3.0*   No results for input(s): LIPASE, AMYLASE in the last 168 hours. No results for input(s): AMMONIA in the last 168 hours. Coagulation Profile: No results for input(s): INR, PROTIME in the last 168 hours. Cardiac Enzymes: No results for input(s): CKTOTAL, CKMB, CKMBINDEX, TROPONINI in the last 168 hours. BNP (last 3 results) No results for input(s): PROBNP in the last 8760 hours. HbA1C: No results for input(s): HGBA1C in the last 72 hours. CBG: No results for input(s): GLUCAP in the last 168 hours. Lipid Profile: No results for input(s): CHOL, HDL, LDLCALC, TRIG, CHOLHDL, LDLDIRECT in the last 72 hours. Thyroid Function Tests: No results for input(s): TSH, T4TOTAL, FREET4, T3FREE, THYROIDAB in the last 72 hours. Anemia Panel: No results for input(s): VITAMINB12, FOLATE, FERRITIN, TIBC, IRON, RETICCTPCT in the last 72 hours. Urine analysis:    Component Value  Date/Time   COLORURINE AMBER (A) 09/09/2019 1146   APPEARANCEUR HAZY (A) 09/09/2019 1146   LABSPEC 1.017 09/09/2019 1146   PHURINE 6.0 09/09/2019 1146   GLUCOSEU NEGATIVE 09/09/2019 1146   HGBUR SMALL (A) 09/09/2019 1146   BILIRUBINUR NEGATIVE 09/09/2019 1146   KETONESUR NEGATIVE 09/09/2019 1146   PROTEINUR 30 (A) 09/09/2019 1146   UROBILINOGEN 0.2 08/03/2015 1714   NITRITE NEGATIVE 09/09/2019 1146   LEUKOCYTESUR LARGE (A) 09/09/2019 1146   Recent Results (from the past 240 hour(s))  SARS Coronavirus 2 [facility](Hospital order, Performed in [facility] hospital lab) Nasopharyngeal Nasopharyngeal Swab     Status: Abnormal   Collection Time: 09/07/19 11:56 PM   Specimen: Nasopharyngeal Swab  Result Value Ref Range Status   SARS Coronavirus 2 POSITIVE (A) NEGATIVE Final    Comment: RESULT CALLED TO, READ BACK BY AND VERIFIED WITH: FRICKEY,J @ 0328 ON [id number] BY POTEAT,S (NOTE) If result is NEGATIVE SARS-CoV-2 target nucleic acids are NOT DETECTED. The SARS-CoV-2 RNA is generally detectable in upper and lower  respiratory specimens during the acute phase of infection. The lowest  concentration of SARS-CoV-2 viral copies this assay can detect is 250  copies / mL. A negative result does not preclude SARS-CoV-2 infection  and should not be used as the sole basis for treatment or other  patient management decisions.  A negative result may occur with  improper specimen collection / handling, submission of specimen other  than nasopharyngeal swab, presence of viral mutation(s) within the  areas targeted by this assay, and inadequate number of viral copies  (<250 copies / mL). A negative result must be combined with clinical  observations, patient history, and epidemiological information. If result is POSITIVE SARS-CoV-2 target nucleic acids are DETECTED.  The SARS-CoV-2 RNA is generally detectable in upper and lower  respiratory specimens during the acute phase of infection.  Positive  results  are indicative of active infection with SARS-CoV-2.  Clinical  correlation with patient history and other diagnostic information is  necessary to determine patient infection status.  Positive results do  not rule out bacterial infection or co-infection with other viruses. If result is PRESUMPTIVE POSTIVE SARS-CoV-2 nucleic acids MAY BE PRESENT.   A presumptive positive result was obtained on the submitted specimen  and confirmed on repeat testing.  While 2019 novel coronavirus  (  SARS-CoV-2) nucleic acids may be present in the submitted sample  additional confirmatory testing may be necessary for epidemiological  and / or clinical management purposes  to differentiate between  SARS-CoV-2 and other Sarbecovirus currently known to infect humans.  If clinically indicated additional testing with an alternate test  methodology [phone])  is advised. The SARS-CoV-2 RNA is generally  detectable in upper and lower respiratory specimens during the acute  phase of infection. The expected result is Negative. Fact Sheet for Patients:  BoilerBrush.com.cy Fact Sheet for Healthcare Providers: https://pope.com/ This test is not yet approved or cleared by the [place] FDA and has been authorized for detection and/or diagnosis of SARS-CoV-2 by FDA under an Emergency Use Authorization (EUA).  This EUA will remain in effect (meaning this test can be used) for the duration of the COVID-19 declaration under Section 564(b)(1) of the Act, 21 U.S.C. section 360bbb-3(b)(1), unless the authorization is terminated or revoked sooner. Performed at [facility], [address], [place], [place] [address]   Culture, Urine     Status: Abnormal   Collection Time: 09/09/19 11:45 AM   Specimen: Urine, Random  Result Value Ref Range Status   Specimen Description   Final    URINE, RANDOM Performed at [facility], [address], [place], [place] [address]    Special Requests   Final    NONE Performed at [facility], [address] [place], [place] [address]    Culture >=100,000 COLONIES/mL ALCALIGENES FAECALIS (A)  Final   Report Status 09/12/2019 FINAL  Final   Organism ID, Bacteria ALCALIGENES FAECALIS (A)  Final      Susceptibility   Alcaligenes faecalis - MIC*    CEFEPIME >=64 RESISTANT Resistant     CEFAZOLIN 32 INTERMEDIATE Intermediate     GENTAMICIN 2 SENSITIVE Sensitive     CIPROFLOXACIN >=4 RESISTANT Resistant     IMIPENEM 0.5 SENSITIVE Sensitive     TRIMETH/SULFA >=320 RESISTANT Resistant     * >=100,000 COLONIES/mL ALCALIGENES FAECALIS      Radiology Studies: No results found.  Scheduled Meds: . darifenacin  15 mg Oral Daily  . dexamethasone  6 mg Oral Q24H  . diazepam  5 mg Oral TID  . enoxaparin (LOVENOX) injection  30 mg Subcutaneous Q24H  . hydrocerin   Topical BID  . HYDROmorphone  4 mg Oral TID  . nystatin   Topical TID  . pantoprazole  40 mg Oral Daily  . pregabalin  75 mg Oral TID   Continuous Infusions: . remdesivir 100 mg in NS 250 mL 100 mg (09/14/19 1036)     LOS: 6 days   Time spent: 25 minutes.  Huey Bienenstock, MD [facility] [email or web address] Password [email or web address] 09/14/2019, 2:02 PM

## 2019-09-14 NOTE — Care Management Important Message (Signed)
Important Message  Patient Details  Name: [name] MRN: [id number] Date of Birth: Mar 24, 1986   Medicare Important Message Given:  Yes - Important Message mailed due to current National Emergency  Verbal consent obtained due to current National Emergency  Relationship to patient: Spouse/Significant Other Contact Name: Kevin Fenton Call Date: 09/14/19  Time: 1503 Phone: [phone] Outcome: Spoke with contact Important Message mailed to: Patient address on file    Delorse Lek 09/14/2019, 3:04 PM

## 2019-09-15 LAB — CBC WITH DIFFERENTIAL/PLATELET
Abs Immature Granulocytes: 0.1 10*3/uL — ABNORMAL HIGH (ref 0.00–0.07)
Basophils Absolute: 0 10*3/uL (ref 0.0–0.1)
Basophils Relative: 0 %
Eosinophils Absolute: 0 10*3/uL (ref 0.0–0.5)
Eosinophils Relative: 0 %
HCT: 37.2 % (ref 36.0–46.0)
Hemoglobin: 11.5 g/dL — ABNORMAL LOW (ref 12.0–15.0)
Immature Granulocytes: 2 %
Lymphocytes Relative: 14 %
Lymphs Abs: 0.6 10*3/uL — ABNORMAL LOW (ref 0.7–4.0)
MCH: 25.2 pg — ABNORMAL LOW (ref 26.0–34.0)
MCHC: 30.9 g/dL (ref 30.0–36.0)
MCV: 81.4 fL (ref 80.0–100.0)
Monocytes Absolute: 0.2 10*3/uL (ref 0.1–1.0)
Monocytes Relative: 4 %
Neutro Abs: 3.7 10*3/uL (ref 1.7–7.7)
Neutrophils Relative %: 80 %
Platelets: 294 10*3/uL (ref 150–400)
RBC: 4.57 MIL/uL (ref 3.87–5.11)
RDW: 16.2 % — ABNORMAL HIGH (ref 11.5–15.5)
WBC: 4.6 10*3/uL (ref 4.0–10.5)
nRBC: 0 % (ref 0.0–0.2)

## 2019-09-15 LAB — COMPREHENSIVE METABOLIC PANEL
ALT: 37 U/L (ref 0–44)
AST: 27 U/L (ref 15–41)
Albumin: 3 g/dL — ABNORMAL LOW (ref 3.5–5.0)
Alkaline Phosphatase: 44 U/L (ref 38–126)
Anion gap: 9 (ref 5–15)
BUN: 17 mg/dL (ref 6–20)
CO2: 25 mmol/L (ref 22–32)
Calcium: 8.2 mg/dL — ABNORMAL LOW (ref 8.9–10.3)
Chloride: 103 mmol/L (ref 98–111)
Creatinine, Ser: 0.5 mg/dL (ref 0.44–1.00)
GFR calc Af Amer: >60 mL/min (ref >60)
GFR calc non Af Amer: >60 mL/min (ref >60)
Glucose, Bld: 143 mg/dL — ABNORMAL HIGH (ref 70–99)
Potassium: 4.4 mmol/L (ref 3.5–5.1)
Sodium: 137 mmol/L (ref 135–145)
Total Bilirubin: 0.2 mg/dL — ABNORMAL LOW (ref 0.3–1.2)
Total Protein: 7 g/dL (ref 6.5–8.1)

## 2019-09-15 LAB — C-REACTIVE PROTEIN: CRP: 0.9 mg/dL (ref <1.0)

## 2019-09-15 MED ORDER — DEXAMETHASONE 6 MG PO TABS: 6.0000 mg | ORAL_TABLET | ORAL | 0 refills | Status: DC

## 2019-09-15 NOTE — Progress Notes (Signed)
Pt says she spoke to her  Husband. No need to call him tonight.

## 2019-09-15 NOTE — TOC Transition Note (Signed)
Transition of Care [facility]) - CM/SW Discharge Note   Patient Details  Name: [name] MRN: [id number] Date of Birth: [birth year]  [facility] CM/SW Contact:  Stacy Meeker, RN Phone Number:  [phone] (working remotely) 09/15/2019, 12:07 PM   Clinical Narrative:   33 yr old female admitted and treated for COVID 19. Thankfully she is improving and will discharge home. Case manager spoke with patient via telephone concerning discharge plan. She states she has been with [facility] in the past and would like to do so now. [name] also requested that CM ask for [name] to be her assigned therapist. Case manager contacted [name], [place] Liaison with referral and request. Patient asks that [facility] transport her home, her significant other is home recovering from [place] as well and no other transport available. CM contacted [facility], transport scheduled for 1pm. Charge nurse notified.     Final next level of care: [place] Barriers to Discharge: No Barriers Identified   Patient Goals and CMS Choice Patient states their goals for this hospitalization and ongoing recovery are:: continue to get better CMS Medicare.gov Compare Post Acute Care list provided to:: Patient Choice offered to / list presented to : Patient  Discharge Placement                       Discharge Plan and Services   Discharge Planning Services: CM Consult Post Acute Care Choice: Home Health          DME Arranged: N/A         HH Arranged: PT, OT [place] Agency: [place] ([facility]) Date HH Agency Contacted: 09/15/19 Time [place]: 1200 Representative spoke with at [place]: [name] ([place]) Interventions     Readmission Risk Interventions No flowsheet data found.

## 2019-09-15 NOTE — Progress Notes (Signed)
Patient discharged to home. AVS was reviewed, all questions answered and public health contract signed and placed in chart. Prescription was sent to the patient's pharmacy. IV removed. All dressings changed, clean dry and intact. V/S stable. [facility] provided transportation.

## 2019-09-15 NOTE — Discharge Instructions (Signed)
Person Under Monitoring Name: [name]  Location: [place] [address]   Infection Prevention Recommendations for Individuals Confirmed to have, or Being Evaluated for, 2019 Novel Coronavirus (COVID-19) Infection Who Receive Care at Home  Individuals who are confirmed to have, or are being evaluated for, COVID-19 should follow the prevention steps below until a healthcare provider or local or state health department says they can return to normal activities.  Stay home except to get medical care You should restrict activities outside your home, except for getting medical care. Do not go to work, school, or public areas, and do not use public transportation or taxis.  Call ahead before visiting your doctor Before your medical appointment, call the healthcare provider and tell them that you have, or are being evaluated for, COVID-19 infection. This will help the healthcare providers office take steps to keep other people from getting infected. Ask your healthcare provider to call the local or state health department.  Monitor your symptoms Seek prompt medical attention if your illness is worsening (e.g., difficulty breathing). Before going to your medical appointment, call the healthcare provider and tell them that you have, or are being evaluated for, COVID-19 infection. Ask your healthcare provider to call the local or state health department.  Wear a facemask You should wear a facemask that covers your nose and mouth when you are in the same room with other people and when you visit a healthcare provider. People who live with or visit you should also wear a facemask while they are in the same room with you.  Separate yourself from other people in your home As much as possible, you should stay in a different room from other people in your home. Also, you should use a separate bathroom, if available.  Avoid sharing household items You should not share  dishes, drinking glasses, cups, eating utensils, towels, bedding, or other items with other people in your home. After using these items, you should wash them thoroughly with soap and water.  Cover your coughs and sneezes Cover your mouth and nose with a tissue when you cough or sneeze, or you can cough or sneeze into your sleeve. Throw used tissues in a lined trash can, and immediately wash your hands with soap and water for at least 20 seconds or use an alcohol-based hand rub.  Wash your [facility] your hands often and thoroughly with soap and water for at least 20 seconds. You can use an alcohol-based hand sanitizer if soap and water are not available and if your hands are not visibly dirty. Avoid touching your eyes, nose, and mouth with unwashed hands.   Prevention Steps for Caregivers and Household Members of Individuals Confirmed to have, or Being Evaluated for, COVID-19 Infection Being Cared for in the Home  If you live with, or provide care at home for, a person confirmed to have, or being evaluated for, COVID-19 infection please follow these guidelines to prevent infection:  Follow healthcare providers instructions Make sure that you understand and can help the patient follow any healthcare provider instructions for all care.  Provide for the patients basic needs You should help the patient with basic needs in the home and provide support for getting groceries, prescriptions, and other personal needs.  Monitor the patients symptoms If they are getting sicker, call his or her medical provider and tell them that the patient has, or is being evaluated for, COVID-19 infection. This will help the healthcare providers office  take steps to keep other people from getting infected. Ask the healthcare provider to call the local or state health department.  Limit the number of people who have contact with the patient  If possible, have only one caregiver for the patient.  Other  household members should stay in another home or place of residence. If this is not possible, they should stay  in another room, or be separated from the patient as much as possible. Use a separate bathroom, if available.  Restrict visitors who do not have an essential need to be in the home.  Keep older adults, very young children, and other sick people away from the patient Keep older adults, very young children, and those who have compromised immune systems or chronic health conditions away from the patient. This includes people with chronic heart, lung, or kidney conditions, diabetes, and cancer.  Ensure good ventilation Make sure that shared spaces in the home have good air flow, such as from an air conditioner or an opened window, weather permitting.  Wash your hands often  Wash your hands often and thoroughly with soap and water for at least 20 seconds. You can use an alcohol based hand sanitizer if soap and water are not available and if your hands are not visibly dirty.  Avoid touching your eyes, nose, and mouth with unwashed hands.  Use disposable paper towels to dry your hands. If not available, use dedicated cloth towels and replace them when they become wet.  Wear a facemask and gloves  Wear a disposable facemask at all times in the room and gloves when you touch or have contact with the patients blood, body fluids, and/or secretions or excretions, such as sweat, saliva, sputum, nasal mucus, vomit, urine, or feces.  Ensure the mask fits over your nose and mouth tightly, and do not touch it during use.  Throw out disposable facemasks and gloves after using them. Do not reuse.  Wash your hands immediately after removing your facemask and gloves.  If your personal clothing becomes contaminated, carefully remove clothing and launder. Wash your hands after handling contaminated clothing.  Place all used disposable facemasks, gloves, and other waste in a lined container before  disposing them with other household waste.  Remove gloves and wash your hands immediately after handling these items.  Do not share dishes, glasses, or other household items with the patient  Avoid sharing household items. You should not share dishes, drinking glasses, cups, eating utensils, towels, bedding, or other items with a patient who is confirmed to have, or being evaluated for, COVID-19 infection.  After the person uses these items, you should wash them thoroughly with soap and water.  Wash laundry thoroughly  Immediately remove and wash clothes or bedding that have blood, body fluids, and/or secretions or excretions, such as sweat, saliva, sputum, nasal mucus, vomit, urine, or feces, on them.  Wear gloves when handling laundry from the patient.  Read and follow directions on labels of laundry or clothing items and detergent. In general, wash and dry with the warmest temperatures recommended on the label.  Clean all areas the individual has used often  Clean all touchable surfaces, such as counters, tabletops, doorknobs, bathroom fixtures, toilets, phones, keyboards, tablets, and bedside tables, every day. Also, clean any surfaces that may have blood, body fluids, and/or secretions or excretions on them.  Wear gloves when cleaning surfaces the patient has come in contact with.  Use a diluted bleach solution (e.g., dilute bleach with 1 part  bleach and 10 parts water) or a household disinfectant with a label that says EPA-registered for coronaviruses. To make a bleach solution at home, add 1 tablespoon of bleach to 1 quart (4 cups) of water. For a larger supply, add  cup of bleach to 1 gallon (16 cups) of water.  Read labels of cleaning products and follow recommendations provided on product labels. Labels contain instructions for safe and effective use of the cleaning product including precautions you should take when applying the product, such as wearing gloves or eye protection  and making sure you have good ventilation during use of the product.  Remove gloves and wash hands immediately after cleaning.  Monitor yourself for signs and symptoms of illness Caregivers and household members are considered close contacts, should monitor their health, and will be asked to limit movement outside of the home to the extent possible. Follow the monitoring steps for close contacts listed on the symptom monitoring form.   ? If you have additional questions, contact your local health department or call the epidemiologist on call at [phone] (available 24/7). ? This guidance is subject to change. For the most up-to-date guidance from [facility], please refer to their website: [email or web address]
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
## 2019-09-26 ENCOUNTER — Telehealth: Payer: Self-pay | Admitting: Neurology

## 2019-09-26 NOTE — Telephone Encounter (Signed)
Noted, thanks!

## 2019-09-26 NOTE — Telephone Encounter (Signed)
Pt is asking for a call from RN to discuss her upcoming appointment for 10-08 and her Covid diagnosis on 09-16.  Please call

## 2019-09-26 NOTE — Telephone Encounter (Signed)
Pt returned call and stated she is symptom free and no fever , she states she would be able to come in office on 09/29/2019

## 2019-09-26 NOTE — Telephone Encounter (Signed)
Attempted to reach the pt. Pt was not available. Will try again at a later time. If the pt calls back; since it's been over 2 weeks, if she is symptom free and no fever we can see her on 09/29/2019 or we could do a my chart video visit.

## 2019-09-29 ENCOUNTER — Ambulatory Visit (INDEPENDENT_AMBULATORY_CARE_PROVIDER_SITE_OTHER): Payer: Medicare Other | Admitting: Neurology

## 2019-09-29 ENCOUNTER — Other Ambulatory Visit: Payer: Self-pay

## 2019-09-29 ENCOUNTER — Encounter: Payer: Self-pay | Admitting: Neurology

## 2019-09-29 VITALS — BP 76/54 | HR 101 | Temp 98.8°F

## 2019-09-29 DIAGNOSIS — F3342 Major depressive disorder, recurrent, in full remission: Secondary | ICD-10-CM | POA: Diagnosis not present

## 2019-09-29 DIAGNOSIS — L8944 Pressure ulcer of contiguous site of back, buttock and hip, stage 4: Secondary | ICD-10-CM | POA: Diagnosis not present

## 2019-09-29 DIAGNOSIS — G8250 Quadriplegia, unspecified: Secondary | ICD-10-CM | POA: Diagnosis not present

## 2019-09-29 MED ORDER — DIAZEPAM 5 MG PO TABS: ORAL_TABLET | ORAL | 1 refills | Status: AC

## 2019-09-29 MED ORDER — TIZANIDINE HCL 2 MG PO TABS: 2.0000 mg | ORAL_TABLET | Freq: Three times a day (TID) | ORAL | 1 refills | Status: DC | PRN
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
## 2019-09-29 NOTE — Patient Instructions (Signed)
We will add tizanidine 2 mg up to three times a day if needed for the spasticity.

## 2019-10-07 ENCOUNTER — Other Ambulatory Visit: Payer: Self-pay

## 2019-10-07 DIAGNOSIS — Z20822 Contact with and (suspected) exposure to covid-19: Secondary | ICD-10-CM

## 2019-10-09 LAB — NOVEL CORONAVIRUS, NAA: SARS-CoV-2, NAA: NOT DETECTED

## 2019-10-11 ENCOUNTER — Inpatient Hospital Stay (HOSPITAL_COMMUNITY)
Admission: EM | Admit: 2019-10-11 | Discharge: 2019-10-18 | DRG: 177 | Disposition: A | Discharge status: Home Health | Payer: Medicare Other | Attending: Internal Medicine | Admitting: Internal Medicine

## 2019-10-11 ENCOUNTER — Other Ambulatory Visit: Payer: Self-pay

## 2019-10-11 ENCOUNTER — Emergency Department (HOSPITAL_COMMUNITY): Payer: Medicare Other

## 2019-10-11 DIAGNOSIS — L89329 Pressure ulcer of left buttock, unspecified stage: Secondary | ICD-10-CM | POA: Diagnosis present

## 2019-10-11 DIAGNOSIS — G8222 Paraplegia, incomplete: Secondary | ICD-10-CM | POA: Diagnosis present

## 2019-10-11 DIAGNOSIS — Z981 Arthrodesis status: Secondary | ICD-10-CM

## 2019-10-11 DIAGNOSIS — Z8619 Personal history of other infectious and parasitic diseases: Secondary | ICD-10-CM

## 2019-10-11 DIAGNOSIS — G8250 Quadriplegia, unspecified: Secondary | ICD-10-CM | POA: Diagnosis present

## 2019-10-11 DIAGNOSIS — J9601 Acute respiratory failure with hypoxia: Secondary | ICD-10-CM | POA: Diagnosis present

## 2019-10-11 DIAGNOSIS — J90 Pleural effusion, not elsewhere classified: Secondary | ICD-10-CM | POA: Diagnosis present

## 2019-10-11 DIAGNOSIS — Z885 Allergy status to narcotic agent status: Secondary | ICD-10-CM

## 2019-10-11 DIAGNOSIS — Z95828 Presence of other vascular implants and grafts: Secondary | ICD-10-CM

## 2019-10-11 DIAGNOSIS — Z801 Family history of malignant neoplasm of trachea, bronchus and lung: Secondary | ICD-10-CM

## 2019-10-11 DIAGNOSIS — Z791 Long term (current) use of non-steroidal anti-inflammatories (NSAID): Secondary | ICD-10-CM

## 2019-10-11 DIAGNOSIS — L89109 Pressure ulcer of unspecified part of back, unspecified stage: Secondary | ICD-10-CM | POA: Diagnosis present

## 2019-10-11 DIAGNOSIS — R600 Localized edema: Secondary | ICD-10-CM | POA: Diagnosis present

## 2019-10-11 DIAGNOSIS — Z79891 Long term (current) use of opiate analgesic: Secondary | ICD-10-CM

## 2019-10-11 DIAGNOSIS — Z79899 Other long term (current) drug therapy: Secondary | ICD-10-CM

## 2019-10-11 DIAGNOSIS — Z8701 Personal history of pneumonia (recurrent): Secondary | ICD-10-CM

## 2019-10-11 DIAGNOSIS — T83511A Infection and inflammatory reaction due to indwelling urethral catheter, initial encounter: Secondary | ICD-10-CM | POA: Diagnosis present

## 2019-10-11 DIAGNOSIS — J45909 Unspecified asthma, uncomplicated: Secondary | ICD-10-CM | POA: Diagnosis present

## 2019-10-11 DIAGNOSIS — R0602 Shortness of breath: Secondary | ICD-10-CM

## 2019-10-11 DIAGNOSIS — L89209 Pressure ulcer of unspecified hip, unspecified stage: Secondary | ICD-10-CM | POA: Diagnosis present

## 2019-10-11 DIAGNOSIS — F419 Anxiety disorder, unspecified: Secondary | ICD-10-CM | POA: Diagnosis present

## 2019-10-11 DIAGNOSIS — R0902 Hypoxemia: Secondary | ICD-10-CM | POA: Diagnosis not present

## 2019-10-11 DIAGNOSIS — N319 Neuromuscular dysfunction of bladder, unspecified: Secondary | ICD-10-CM | POA: Diagnosis present

## 2019-10-11 DIAGNOSIS — F339 Major depressive disorder, recurrent, unspecified: Secondary | ICD-10-CM | POA: Diagnosis present

## 2019-10-11 DIAGNOSIS — Z87440 Personal history of urinary (tract) infections: Secondary | ICD-10-CM

## 2019-10-11 DIAGNOSIS — N390 Urinary tract infection, site not specified: Secondary | ICD-10-CM | POA: Diagnosis present

## 2019-10-11 DIAGNOSIS — Z9359 Other cystostomy status: Secondary | ICD-10-CM

## 2019-10-11 DIAGNOSIS — G8929 Other chronic pain: Secondary | ICD-10-CM | POA: Diagnosis present

## 2019-10-11 DIAGNOSIS — L89319 Pressure ulcer of right buttock, unspecified stage: Secondary | ICD-10-CM | POA: Diagnosis present

## 2019-10-11 DIAGNOSIS — R030 Elevated blood-pressure reading, without diagnosis of hypertension: Secondary | ICD-10-CM | POA: Diagnosis present

## 2019-10-11 DIAGNOSIS — Z833 Family history of diabetes mellitus: Secondary | ICD-10-CM

## 2019-10-11 DIAGNOSIS — D62 Acute posthemorrhagic anemia: Secondary | ICD-10-CM | POA: Diagnosis present

## 2019-10-11 DIAGNOSIS — Z8249 Family history of ischemic heart disease and other diseases of the circulatory system: Secondary | ICD-10-CM

## 2019-10-11 DIAGNOSIS — Z823 Family history of stroke: Secondary | ICD-10-CM

## 2019-10-11 DIAGNOSIS — J189 Pneumonia, unspecified organism: Secondary | ICD-10-CM | POA: Diagnosis present

## 2019-10-11 DIAGNOSIS — Z888 Allergy status to other drugs, medicaments and biological substances status: Secondary | ICD-10-CM

## 2019-10-11 DIAGNOSIS — J1289 Other viral pneumonia: Secondary | ICD-10-CM | POA: Diagnosis present

## 2019-10-11 DIAGNOSIS — Q600 Renal agenesis, unilateral: Secondary | ICD-10-CM

## 2019-10-11 DIAGNOSIS — L8944 Pressure ulcer of contiguous site of back, buttock and hip, stage 4: Secondary | ICD-10-CM | POA: Diagnosis present

## 2019-10-11 DIAGNOSIS — U071 COVID-19: Principal | ICD-10-CM | POA: Diagnosis present

## 2019-10-11 DIAGNOSIS — N920 Excessive and frequent menstruation with regular cycle: Secondary | ICD-10-CM | POA: Diagnosis present

## 2019-10-11 DIAGNOSIS — Z85820 Personal history of malignant melanoma of skin: Secondary | ICD-10-CM

## 2019-10-11 DIAGNOSIS — D649 Anemia, unspecified: Secondary | ICD-10-CM | POA: Diagnosis present

## 2019-10-11 LAB — TROPONIN I (HIGH SENSITIVITY)
Troponin I (High Sensitivity): 2 ng/L (ref <18)
Troponin I (High Sensitivity): 2 ng/L (ref <18)

## 2019-10-11 LAB — APTT: aPTT: 20 seconds — ABNORMAL LOW (ref 24–36)

## 2019-10-11 LAB — CBC WITH DIFFERENTIAL/PLATELET
Abs Immature Granulocytes: 0.04 10*3/uL (ref 0.00–0.07)
Basophils Absolute: 0 10*3/uL (ref 0.0–0.1)
Basophils Relative: 0 %
Eosinophils Absolute: 0.1 10*3/uL (ref 0.0–0.5)
Eosinophils Relative: 1 %
HCT: 29.7 % — ABNORMAL LOW (ref 36.0–46.0)
Hemoglobin: 8.6 g/dL — ABNORMAL LOW (ref 12.0–15.0)
Immature Granulocytes: 0 %
Lymphocytes Relative: 17 %
Lymphs Abs: 1.5 10*3/uL (ref 0.7–4.0)
MCH: 24.4 pg — ABNORMAL LOW (ref 26.0–34.0)
MCHC: 29 g/dL — ABNORMAL LOW (ref 30.0–36.0)
MCV: 84.1 fL (ref 80.0–100.0)
Monocytes Absolute: 1.3 10*3/uL — ABNORMAL HIGH (ref 0.1–1.0)
Monocytes Relative: 14 %
Neutro Abs: 6.3 10*3/uL (ref 1.7–7.7)
Neutrophils Relative %: 68 %
Platelets: 239 10*3/uL (ref 150–400)
RBC: 3.53 MIL/uL — ABNORMAL LOW (ref 3.87–5.11)
RDW: 16.9 % — ABNORMAL HIGH (ref 11.5–15.5)
WBC: 9.3 10*3/uL (ref 4.0–10.5)
nRBC: 0 % (ref 0.0–0.2)

## 2019-10-11 LAB — URINALYSIS, ROUTINE W REFLEX MICROSCOPIC
Bilirubin Urine: NEGATIVE
Glucose, UA: NEGATIVE mg/dL
Hgb urine dipstick: NEGATIVE
Ketones, ur: NEGATIVE mg/dL
Nitrite: POSITIVE — AB
Protein, ur: 100 mg/dL — AB
Specific Gravity, Urine: 1.026 (ref 1.005–1.030)
pH: 5 (ref 5.0–8.0)

## 2019-10-11 LAB — LACTIC ACID, PLASMA
Lactic Acid, Venous: 0.7 mmol/L (ref 0.5–1.9)
Lactic Acid, Venous: 0.6 mmol/L (ref 0.5–1.9)

## 2019-10-11 LAB — COMPREHENSIVE METABOLIC PANEL
ALT: 19 U/L (ref 0–44)
AST: 19 U/L (ref 15–41)
Albumin: 3 g/dL — ABNORMAL LOW (ref 3.5–5.0)
Alkaline Phosphatase: 62 U/L (ref 38–126)
Anion gap: 7 (ref 5–15)
BUN: 10 mg/dL (ref 6–20)
CO2: 31 mmol/L (ref 22–32)
Calcium: 8.3 mg/dL — ABNORMAL LOW (ref 8.9–10.3)
Chloride: 100 mmol/L (ref 98–111)
Creatinine, Ser: 0.46 mg/dL (ref 0.44–1.00)
GFR calc Af Amer: >60 mL/min (ref >60)
GFR calc non Af Amer: >60 mL/min (ref >60)
Glucose, Bld: 97 mg/dL (ref 70–99)
Potassium: 4.9 mmol/L (ref 3.5–5.1)
Sodium: 138 mmol/L (ref 135–145)
Total Bilirubin: 0.2 mg/dL — ABNORMAL LOW (ref 0.3–1.2)
Total Protein: 7.2 g/dL (ref 6.5–8.1)

## 2019-10-11 LAB — PROTIME-INR
INR: 1 (ref 0.8–1.2)
Prothrombin Time: 12.7 seconds (ref 11.4–15.2)

## 2019-10-11 LAB — PREGNANCY, URINE: Preg Test, Ur: NEGATIVE

## 2019-10-11 LAB — POC OCCULT BLOOD, ED: Fecal Occult Bld: NEGATIVE

## 2019-10-11 MED ORDER — SODIUM CHLORIDE (PF) 0.9 % IJ SOLN
INTRAMUSCULAR | Status: AC
  Administered 2019-10-11: 23:00:00
  Filled 2019-10-11: qty 50

## 2019-10-11 MED ORDER — ONDANSETRON HCL 4 MG/2ML IJ SOLN
4.0000 mg | Freq: Once | INTRAMUSCULAR | Status: AC
  Administered 2019-10-12: 4 mg via INTRAVENOUS
  Filled 2019-10-11: qty 2

## 2019-10-11 MED ORDER — HYDROMORPHONE HCL 2 MG PO TABS
4.0000 mg | ORAL_TABLET | Freq: Once | ORAL | Status: AC
  Administered 2019-10-12: 4 mg via ORAL
  Filled 2019-10-11: qty 2

## 2019-10-11 MED ORDER — IOHEXOL 300 MG/ML  SOLN: 80.0000 mL | Freq: Once | INTRAMUSCULAR | Status: DC | PRN

## 2019-10-11 MED ORDER — IOHEXOL 350 MG/ML SOLN
80.0000 mL | Freq: Once | INTRAVENOUS | Status: AC | PRN
  Administered 2019-10-11: 23:00:00 80 mL via INTRAVENOUS

## 2019-10-11 NOTE — ED Notes (Signed)
Patient transported to CT 

## 2019-10-11 NOTE — ED Notes (Signed)
Patient requesting pain and nausea medication; also requesting something to drink. EDP made aware.
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
## 2019-10-11 NOTE — ED Notes (Signed)
IV team at bedside 

## 2019-10-11 NOTE — ED Triage Notes (Signed)
Transported by [facility] from home-- hx of spina bifida, asthma. Patient is a paraplegic. Patient c/o HA (migraine), orthopnea, shob. Tested positive for COVID 1 month ago. 88-89% on room air, when place on oxygen 3 L via [place].

## 2019-10-11 NOTE — ED Notes (Signed)
X-ray at bedside

## 2019-10-12 ENCOUNTER — Inpatient Hospital Stay (HOSPITAL_COMMUNITY): Payer: Medicare Other

## 2019-10-12 ENCOUNTER — Encounter (HOSPITAL_COMMUNITY): Payer: Self-pay | Admitting: Family Medicine

## 2019-10-12 DIAGNOSIS — G8929 Other chronic pain: Secondary | ICD-10-CM | POA: Diagnosis present

## 2019-10-12 DIAGNOSIS — U071 COVID-19: Secondary | ICD-10-CM | POA: Diagnosis present

## 2019-10-12 DIAGNOSIS — Q600 Renal agenesis, unilateral: Secondary | ICD-10-CM | POA: Diagnosis not present

## 2019-10-12 DIAGNOSIS — L89109 Pressure ulcer of unspecified part of back, unspecified stage: Secondary | ICD-10-CM | POA: Diagnosis present

## 2019-10-12 DIAGNOSIS — Z888 Allergy status to other drugs, medicaments and biological substances status: Secondary | ICD-10-CM | POA: Diagnosis not present

## 2019-10-12 DIAGNOSIS — Z833 Family history of diabetes mellitus: Secondary | ICD-10-CM | POA: Diagnosis not present

## 2019-10-12 DIAGNOSIS — Z85820 Personal history of malignant melanoma of skin: Secondary | ICD-10-CM | POA: Diagnosis not present

## 2019-10-12 DIAGNOSIS — Z885 Allergy status to narcotic agent status: Secondary | ICD-10-CM | POA: Diagnosis not present

## 2019-10-12 DIAGNOSIS — G8250 Quadriplegia, unspecified: Secondary | ICD-10-CM | POA: Diagnosis not present

## 2019-10-12 DIAGNOSIS — L8944 Pressure ulcer of contiguous site of back, buttock and hip, stage 4: Secondary | ICD-10-CM

## 2019-10-12 DIAGNOSIS — F339 Major depressive disorder, recurrent, unspecified: Secondary | ICD-10-CM | POA: Diagnosis present

## 2019-10-12 DIAGNOSIS — J9601 Acute respiratory failure with hypoxia: Secondary | ICD-10-CM | POA: Diagnosis present

## 2019-10-12 DIAGNOSIS — Z87440 Personal history of urinary (tract) infections: Secondary | ICD-10-CM | POA: Diagnosis not present

## 2019-10-12 DIAGNOSIS — G8222 Paraplegia, incomplete: Secondary | ICD-10-CM | POA: Diagnosis present

## 2019-10-12 DIAGNOSIS — J189 Pneumonia, unspecified organism: Secondary | ICD-10-CM | POA: Diagnosis not present

## 2019-10-12 DIAGNOSIS — D62 Acute posthemorrhagic anemia: Secondary | ICD-10-CM | POA: Diagnosis present

## 2019-10-12 DIAGNOSIS — T83511D Infection and inflammatory reaction due to indwelling urethral catheter, subsequent encounter: Secondary | ICD-10-CM | POA: Diagnosis not present

## 2019-10-12 DIAGNOSIS — F419 Anxiety disorder, unspecified: Secondary | ICD-10-CM | POA: Diagnosis present

## 2019-10-12 DIAGNOSIS — J90 Pleural effusion, not elsewhere classified: Secondary | ICD-10-CM | POA: Diagnosis present

## 2019-10-12 DIAGNOSIS — Z9359 Other cystostomy status: Secondary | ICD-10-CM | POA: Diagnosis not present

## 2019-10-12 DIAGNOSIS — R600 Localized edema: Secondary | ICD-10-CM | POA: Diagnosis not present

## 2019-10-12 DIAGNOSIS — R0902 Hypoxemia: Secondary | ICD-10-CM | POA: Diagnosis present

## 2019-10-12 DIAGNOSIS — R0602 Shortness of breath: Secondary | ICD-10-CM

## 2019-10-12 DIAGNOSIS — N319 Neuromuscular dysfunction of bladder, unspecified: Secondary | ICD-10-CM | POA: Diagnosis present

## 2019-10-12 DIAGNOSIS — Z981 Arthrodesis status: Secondary | ICD-10-CM | POA: Diagnosis not present

## 2019-10-12 DIAGNOSIS — Z8249 Family history of ischemic heart disease and other diseases of the circulatory system: Secondary | ICD-10-CM | POA: Diagnosis not present

## 2019-10-12 DIAGNOSIS — J1289 Other viral pneumonia: Secondary | ICD-10-CM | POA: Diagnosis present

## 2019-10-12 DIAGNOSIS — L89209 Pressure ulcer of unspecified hip, unspecified stage: Secondary | ICD-10-CM | POA: Diagnosis present

## 2019-10-12 DIAGNOSIS — L89329 Pressure ulcer of left buttock, unspecified stage: Secondary | ICD-10-CM | POA: Diagnosis present

## 2019-10-12 DIAGNOSIS — D649 Anemia, unspecified: Secondary | ICD-10-CM

## 2019-10-12 DIAGNOSIS — L89319 Pressure ulcer of right buttock, unspecified stage: Secondary | ICD-10-CM | POA: Diagnosis present

## 2019-10-12 DIAGNOSIS — R030 Elevated blood-pressure reading, without diagnosis of hypertension: Secondary | ICD-10-CM | POA: Diagnosis present

## 2019-10-12 LAB — CBC WITH DIFFERENTIAL/PLATELET
Abs Immature Granulocytes: 0.04 10*3/uL (ref 0.00–0.07)
Basophils Absolute: 0 10*3/uL (ref 0.0–0.1)
Basophils Relative: 1 %
Eosinophils Absolute: 0.1 10*3/uL (ref 0.0–0.5)
Eosinophils Relative: 1 %
HCT: 32.4 % — ABNORMAL LOW (ref 36.0–46.0)
Hemoglobin: 9.2 g/dL — ABNORMAL LOW (ref 12.0–15.0)
Immature Granulocytes: 1 %
Lymphocytes Relative: 19 %
Lymphs Abs: 1.3 10*3/uL (ref 0.7–4.0)
MCH: 24.7 pg — ABNORMAL LOW (ref 26.0–34.0)
MCHC: 28.4 g/dL — ABNORMAL LOW (ref 30.0–36.0)
MCV: 86.9 fL (ref 80.0–100.0)
Monocytes Absolute: 0.6 10*3/uL (ref 0.1–1.0)
Monocytes Relative: 8 %
Neutro Abs: 4.9 10*3/uL (ref 1.7–7.7)
Neutrophils Relative %: 70 %
Platelets: 235 10*3/uL (ref 150–400)
RBC: 3.73 MIL/uL — ABNORMAL LOW (ref 3.87–5.11)
RDW: 16.9 % — ABNORMAL HIGH (ref 11.5–15.5)
WBC: 6.9 10*3/uL (ref 4.0–10.5)
nRBC: 0 % (ref 0.0–0.2)

## 2019-10-12 LAB — BLOOD GAS, ARTERIAL
Acid-Base Excess: 6.3 mmol/L — ABNORMAL HIGH (ref 0.0–2.0)
Bicarbonate: 32.8 mmol/L — ABNORMAL HIGH (ref 20.0–28.0)
O2 Content: 3 L/min
O2 Saturation: 96.9 %
Patient temperature: 99.7
pCO2 arterial: 63.1 mmHg — ABNORMAL HIGH (ref 32.0–48.0)
pH, Arterial: 7.336 — ABNORMAL LOW (ref 7.350–7.450)
pO2, Arterial: 97.6 mmHg (ref 83.0–108.0)

## 2019-10-12 LAB — PROCALCITONIN: Procalcitonin: <0.1 ng/mL

## 2019-10-12 LAB — BASIC METABOLIC PANEL
Anion gap: 7 (ref 5–15)
BUN: 9 mg/dL (ref 6–20)
CO2: 28 mmol/L (ref 22–32)
Calcium: 8.1 mg/dL — ABNORMAL LOW (ref 8.9–10.3)
Chloride: 101 mmol/L (ref 98–111)
Creatinine, Ser: 0.37 mg/dL — ABNORMAL LOW (ref 0.44–1.00)
GFR calc Af Amer: >60 mL/min (ref >60)
GFR calc non Af Amer: >60 mL/min (ref >60)
Glucose, Bld: 84 mg/dL (ref 70–99)
Potassium: 4.4 mmol/L (ref 3.5–5.1)
Sodium: 136 mmol/L (ref 135–145)

## 2019-10-12 LAB — LACTATE DEHYDROGENASE: LDH: 131 U/L (ref 98–192)

## 2019-10-12 LAB — STREP PNEUMONIAE URINARY ANTIGEN: Strep Pneumo Urinary Antigen: NEGATIVE

## 2019-10-12 LAB — C-REACTIVE PROTEIN: CRP: 10.4 mg/dL — ABNORMAL HIGH (ref <1.0)

## 2019-10-12 LAB — BRAIN NATRIURETIC PEPTIDE: B Natriuretic Peptide: 113.1 pg/mL — ABNORMAL HIGH (ref 0.0–100.0)

## 2019-10-12 LAB — ECHOCARDIOGRAM COMPLETE
Height: 56 in
Weight: 1472 oz

## 2019-10-12 LAB — SEDIMENTATION RATE: Sed Rate: 51 mm/hr — ABNORMAL HIGH (ref 0–22)

## 2019-10-12 LAB — SARS CORONAVIRUS 2 (TAT 6-24 HRS): SARS Coronavirus 2: POSITIVE — AB

## 2019-10-12 LAB — FERRITIN: Ferritin: 7 ng/mL — ABNORMAL LOW (ref 11–307)

## 2019-10-12 LAB — FIBRINOGEN: Fibrinogen: 616 mg/dL — ABNORMAL HIGH (ref 210–475)

## 2019-10-12 LAB — D-DIMER, QUANTITATIVE: D-Dimer, Quant: 3.1 ug/mL-FEU — ABNORMAL HIGH (ref 0.00–0.50)

## 2019-10-12 MED ORDER — POLYETHYLENE GLYCOL 3350 17 G PO PACK: 17.0000 g | PACK | Freq: Every day | ORAL | Status: DC | PRN

## 2019-10-12 MED ORDER — DIAZEPAM 5 MG PO TABS
5.0000 mg | ORAL_TABLET | Freq: Three times a day (TID) | ORAL | Status: DC | PRN
  Administered 2019-10-12 – 2019-10-17 (×10): 5 mg via ORAL
  Filled 2019-10-12 (×10): qty 1

## 2019-10-12 MED ORDER — HYDROCODONE-ACETAMINOPHEN 5-325 MG PO TABS
1.0000 | ORAL_TABLET | ORAL | Status: DC | PRN
  Administered 2019-10-13 – 2019-10-17 (×3): 1 via ORAL
  Filled 2019-10-12 (×4): qty 1

## 2019-10-12 MED ORDER — SODIUM CHLORIDE 0.9% FLUSH: 3.0000 mL | INTRAVENOUS | Status: DC | PRN

## 2019-10-12 MED ORDER — SODIUM CHLORIDE 0.9 % IV SOLN: 2.0000 g | Freq: Once | INTRAVENOUS | Status: DC

## 2019-10-12 MED ORDER — SODIUM CHLORIDE 0.9 % IV SOLN
1.0000 g | Freq: Once | INTRAVENOUS | Status: AC
  Administered 2019-10-12: 04:00:00 1 g via INTRAVENOUS
  Filled 2019-10-12: qty 1

## 2019-10-12 MED ORDER — SODIUM CHLORIDE 0.9 % IV SOLN: 250.0000 mL | INTRAVENOUS | Status: DC | PRN

## 2019-10-12 MED ORDER — SODIUM CHLORIDE 0.9% FLUSH
3.0000 mL | Freq: Two times a day (BID) | INTRAVENOUS | Status: DC
  Administered 2019-10-12 – 2019-10-18 (×12): 3 mL via INTRAVENOUS

## 2019-10-12 MED ORDER — PREGABALIN 50 MG PO CAPS
75.0000 mg | ORAL_CAPSULE | Freq: Three times a day (TID) | ORAL | Status: DC
  Administered 2019-10-12 – 2019-10-18 (×19): 75 mg via ORAL
  Filled 2019-10-12 (×19): qty 1

## 2019-10-12 MED ORDER — TIZANIDINE HCL 2 MG PO TABS
2.0000 mg | ORAL_TABLET | Freq: Every evening | ORAL | Status: DC
  Administered 2019-10-12: 15:00:00 via ORAL
  Administered 2019-10-13 – 2019-10-17 (×5): 2 mg via ORAL
  Filled 2019-10-12 (×7): qty 1

## 2019-10-12 MED ORDER — SODIUM CHLORIDE 0.9% FLUSH
3.0000 mL | Freq: Two times a day (BID) | INTRAVENOUS | Status: DC
  Administered 2019-10-14: 11:00:00 3 mL via INTRAVENOUS

## 2019-10-12 MED ORDER — ONDANSETRON HCL 4 MG/2ML IJ SOLN
4.0000 mg | Freq: Four times a day (QID) | INTRAMUSCULAR | Status: DC | PRN
  Administered 2019-10-12 – 2019-10-13 (×3): 4 mg via INTRAVENOUS
  Filled 2019-10-12 (×3): qty 2

## 2019-10-12 MED ORDER — SODIUM CHLORIDE 0.9 % IV SOLN
2.0000 g | Freq: Every day | INTRAVENOUS | Status: DC
  Administered 2019-10-12 – 2019-10-13 (×3): 2 g via INTRAVENOUS
  Filled 2019-10-12 (×3): qty 2

## 2019-10-12 MED ORDER — SODIUM CHLORIDE 0.9 % IV SOLN
500.0000 mg | Freq: Every day | INTRAVENOUS | Status: DC
  Administered 2019-10-12 – 2019-10-13 (×3): 500 mg via INTRAVENOUS
  Filled 2019-10-12 (×3): qty 500

## 2019-10-12 MED ORDER — ALBUTEROL SULFATE HFA 108 (90 BASE) MCG/ACT IN AERS
2.0000 | INHALATION_SPRAY | RESPIRATORY_TRACT | Status: DC | PRN
  Administered 2019-10-15 – 2019-10-16 (×2): 2 via RESPIRATORY_TRACT
  Filled 2019-10-12 (×2): qty 6.7

## 2019-10-12 MED ORDER — ONDANSETRON HCL 4 MG PO TABS
4.0000 mg | ORAL_TABLET | Freq: Four times a day (QID) | ORAL | Status: DC | PRN
  Administered 2019-10-12 – 2019-10-17 (×2): 4 mg via ORAL
  Filled 2019-10-12 (×2): qty 1

## 2019-10-12 MED ORDER — BACITRACIN-NEOMYCIN-POLYMYXIN OINTMENT TUBE
TOPICAL_OINTMENT | Freq: Every day | CUTANEOUS | Status: DC
  Administered 2019-10-12 – 2019-10-18 (×7): via TOPICAL
  Filled 2019-10-12: qty 14.17
  Filled 2019-10-12: qty 1

## 2019-10-12 MED ORDER — PANTOPRAZOLE SODIUM 40 MG PO TBEC
40.0000 mg | DELAYED_RELEASE_TABLET | Freq: Every day | ORAL | Status: DC
  Administered 2019-10-12 – 2019-10-18 (×7): 40 mg via ORAL
  Filled 2019-10-12 (×7): qty 1

## 2019-10-12 MED ORDER — DARIFENACIN HYDROBROMIDE ER 7.5 MG PO TB24
15.0000 mg | ORAL_TABLET | Freq: Every day | ORAL | Status: DC
  Administered 2019-10-12 – 2019-10-18 (×7): 15 mg via ORAL
  Filled 2019-10-12: qty 1
  Filled 2019-10-12 (×3): qty 2
  Filled 2019-10-12: qty 1
  Filled 2019-10-12: qty 2
  Filled 2019-10-12: qty 1
  Filled 2019-10-12: qty 2

## 2019-10-12 MED ORDER — VANCOMYCIN HCL IN DEXTROSE 1-5 GM/200ML-% IV SOLN
1000.0000 mg | Freq: Once | INTRAVENOUS | Status: AC
  Administered 2019-10-12: 02:00:00 1000 mg via INTRAVENOUS
  Filled 2019-10-12: qty 200

## 2019-10-12 MED ORDER — ACETAMINOPHEN 650 MG RE SUPP: 650.0000 mg | Freq: Four times a day (QID) | RECTAL | Status: DC | PRN

## 2019-10-12 MED ORDER — FUROSEMIDE 10 MG/ML IJ SOLN
40.0000 mg | Freq: Once | INTRAMUSCULAR | Status: AC
  Administered 2019-10-12: 15:00:00 40 mg via INTRAVENOUS
  Filled 2019-10-12: qty 4

## 2019-10-12 MED ORDER — HYDROMORPHONE HCL 2 MG PO TABS
4.0000 mg | ORAL_TABLET | Freq: Three times a day (TID) | ORAL | Status: DC | PRN
  Administered 2019-10-12 – 2019-10-17 (×12): 4 mg via ORAL
  Filled 2019-10-12 (×12): qty 2

## 2019-10-12 MED ORDER — ACETAMINOPHEN 325 MG PO TABS
650.0000 mg | ORAL_TABLET | Freq: Four times a day (QID) | ORAL | Status: DC | PRN
  Administered 2019-10-12 – 2019-10-13 (×5): 650 mg via ORAL
  Filled 2019-10-12 (×6): qty 2
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
## 2019-10-12 NOTE — Progress Notes (Signed)
Patient's care started prior to midnight in the ED and she was admitted after midnight by my colleague Dr. Christia Reading Opyd and I am in current agreement with this assessment and plan.  Additional changes to the plan of care been made accordingly.  The patient is a 33 year old Caucasian female with a past medical history significant for but not limited to spinal cord injury with spastic quadriplegia, chronic pain, asthma, pressure ulcers and recent COVID-19 infection along with chronic suprapubic catheter with recurrent UTIs who presented to the ED for evaluation of shortness of breath.  Of note she is admitted to the hospital last month with acute hypoxic respiratory failure secondary to COVID-19 pneumonia and was discharged on 09/15/2019 and reported that her fevers and cough had resolved and that her shortness of was much improved and that she does negative for COVID-19 a week ago.  Subsequently she developed insidiously worsening shortness breath with orthopnea which has continued to worsen and she also describes paroxysmal nocturnal dyspnea and reported bilateral leg swelling that has been more chronic.  She has not noted any fever since her hospitalization last month and she does not use any supplemental oxygen via nasal cannula however T-max here was 100 and BNP was elevated.  Of note she tested positive again for SARS-CoV-2 and came in with respiratory complaints.  Likely her respiratory complaints are attributable to heart failure however if she is diuresing does not improve she will be transferred to [facility] tomorrow after my discussion with the team over there.  In the interim we will check an echocardiogram and diurese the patient and ask for cardiology assistance in this matter. She is being treated for the following but not limited to:  Acute hypoxic respiratory failure in the setting of Suspected CHF (Unknown type) vs. Reoccurence of COVID 19 PNA  - Presents with SOB, orthopnea, PND,  and is found to have new supplemental O2 requirement, slightly elevated BNP, and multifocal infiltrates on CTA chest concerning for multifocal PNA  -pH on admission was 7.336, PCO2 was 63.1, PO2 was 97.6, HCO3 was 32.8, and O2 saturation 96.9 - She had COVID-19 pneumonia in September but reports recovering from that and subsequently tested negative but repeat here is POSITIVE  - History is most suggestive of CHF  and BNP was elevated at 113.1 - Treat suspected PNA as below, check echocardiogram, follow daily wt and I/O's  - Will consult Cardiology for further evaluation and recommendations  - Start Diuresis and given IV Lasix 40 mg this AM  -CTA showed "Negative for acute central embolus. Limited evaluation of distal vessels due to respiratory motion. Small left-sided pleural effusion. Multifocal consolidation involving the lower lobes and right upper lobe with ground-glass opacity in the right upper lobe, suspicious for multifocal pneumonia. Mild cardiomegaly."  SIRS  - Presents with SOB, found to have tachycardia and tachypnea, possible pneumonia vs CHF, possible UTI vs colonization, and mild cellulitis surrounding pressure ulcer  - Lactate reassuringly normal  0.6 and procalcitonin level of less than 0.10 - Blood and urine cultures ordered from ED, and she was treated with meropenem and vancomycin in ED  - Continue antibiotics for suspected PNA with Rocephin and azithromycin, check sputum culture, check strep pneumo (Negative) and legionella antigens, check/trend procalcitonin, and follow clinical course   -Check inflammatory markers  COVID 19 Disease -Recently Treated in September and Tested Positive at that time, negative a week ago and + Again this Hospitalization -Will Check Inflammatory Markers -Treat HF and  if still not improving will transfer to [facility] in AM   Normocyctic Anemia  - Hgb is 8.6 on admission, down from 11.5 a month earlier; now hemoglobin/hematocrit is 9.2/32.4 - FOBT  is negative, patient reported heavy menstrual bleeding  -Type and screen  -Continue to monitor for signs symptoms of bleeding -Repeat CBC in a.m.  Spastic quadriplegia; chronic pain  - Continue home regimen   Pressure ulcers, poA - There is surrounding erythema and heat, possible mild cellulitis, continue current antibiotics and wound care  - WOC Nurse consulted for further evaluation and recommendations  We will continue to monitor patient's clinical response to intervention and follow-up on Cardiology recommendation.  Patient still not improving clinically she will be transferred to [facility] in the morning

## 2019-10-12 NOTE — Progress Notes (Signed)
A consult was received from an ED physician for vancomycin per pharmacy dosing.  The patient's profile has been reviewed for ht/wt/allergies/indication/available labs.   A one time order has been placed for vancomycin 1gm iv x1.  Further antibiotics/pharmacy consults should be ordered by admitting physician if indicated.                       Thank you, Nani Skillern Crowford 10/12/2019  12:28 AM

## 2019-10-12 NOTE — Progress Notes (Signed)
Lab from [facility] called Sars Coronovirus Covid test resulted POSITIVE. MD updated. SRP, RN

## 2019-10-12 NOTE — Plan of Care (Signed)

## 2019-10-12 NOTE — Progress Notes (Signed)
  Echocardiogram 2D Echocardiogram has been performed.  [name] 10/12/2019, 3:09 PM

## 2019-10-12 NOTE — ED Notes (Signed)
Respiratory at bedside for ABG

## 2019-10-12 NOTE — Progress Notes (Signed)
Pt refused wound care on sacrum and buttocks. Bil hip and knees complete. Pt does not want to be turned and screamed out, "STOP" when attempted to reposition for wound care. Pt  SP cath patent draining diluted urine from Lasix emptied 900cc. Will cont to monitor. SRP, RN

## 2019-10-12 NOTE — Progress Notes (Signed)
Pt resting in bed, refused Prevalon Boots and Air Flow mattress. Pt [facility] IV red, IV team consulted for restart. Pt husband on the phone and questions answered. Will cont to monitor pt. Covid results remain pending. SRP, RN

## 2019-10-12 NOTE — Consult Note (Signed)
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  WIll recommend Prevalon offloading boots in addition to socks for added protection although unclear if patient will allow this.  Bilateral trochanters with noted nonblanchable erythema present on admission and persists. Will leave open to air, and turn and reposition every two hours. Will implement mattress with low air loss feature.  Wound bed: See above Drainage (amount, consistency, odor) minimal serosanguinous  No odor Periwound:intact Dressing procedure/placement/frequency: Cleanse wounds to ischium with NS and pat dry.  Apply silicone foam and change every three days and PRN soilage.  Cleanse knee burns with NS and pat dry.  Apply neosporin to wound bed. Cover with foam dressing.  Peel back and re apply daily and change foam every three days.  Prevalon boots to heels (if patient allows, has therapeutic socks in place from home) Mattress with low air loss feature.  Turn and reposition every two hours to offload pressure.  Assess hip bony prominences every shift.   Will not follow at this time.  Please re-consult if needed.  Domenic Moras MSN, RN, FNP-BC [facility] Wound, Ostomy, Continence Nurse Pager [phone]  Wound bed: Drainage (amount, consistency, odor)  Periwound: Dressing procedure/placement/frequency:

## 2019-10-13 ENCOUNTER — Inpatient Hospital Stay (HOSPITAL_COMMUNITY): Payer: Medicare Other

## 2019-10-13 DIAGNOSIS — J9601 Acute respiratory failure with hypoxia: Secondary | ICD-10-CM

## 2019-10-13 DIAGNOSIS — J069 Acute upper respiratory infection, unspecified: Secondary | ICD-10-CM

## 2019-10-13 DIAGNOSIS — R600 Localized edema: Secondary | ICD-10-CM | POA: Diagnosis present

## 2019-10-13 DIAGNOSIS — J189 Pneumonia, unspecified organism: Secondary | ICD-10-CM

## 2019-10-13 DIAGNOSIS — G8250 Quadriplegia, unspecified: Secondary | ICD-10-CM

## 2019-10-13 LAB — COMPREHENSIVE METABOLIC PANEL
ALT: 26 U/L (ref 0–44)
AST: 25 U/L (ref 15–41)
Albumin: 2.8 g/dL — ABNORMAL LOW (ref 3.5–5.0)
Alkaline Phosphatase: 59 U/L (ref 38–126)
Anion gap: 13 (ref 5–15)
BUN: 8 mg/dL (ref 6–20)
CO2: 29 mmol/L (ref 22–32)
Calcium: 8.1 mg/dL — ABNORMAL LOW (ref 8.9–10.3)
Chloride: 94 mmol/L — ABNORMAL LOW (ref 98–111)
Creatinine, Ser: 0.4 mg/dL — ABNORMAL LOW (ref 0.44–1.00)
GFR calc Af Amer: >60 mL/min (ref >60)
GFR calc non Af Amer: >60 mL/min (ref >60)
Glucose, Bld: 86 mg/dL (ref 70–99)
Potassium: 4.2 mmol/L (ref 3.5–5.1)
Sodium: 136 mmol/L (ref 135–145)
Total Bilirubin: 0.6 mg/dL (ref 0.3–1.2)
Total Protein: 6.6 g/dL (ref 6.5–8.1)

## 2019-10-13 LAB — RETICULOCYTES
Immature Retic Fract: 17.4 % — ABNORMAL HIGH (ref 2.3–15.9)
RBC.: 3.94 MIL/uL (ref 3.87–5.11)
Retic Count, Absolute: 25.2 10*3/uL (ref 19.0–186.0)
Retic Ct Pct: 3.2 % — ABNORMAL HIGH (ref 0.4–3.1)

## 2019-10-13 LAB — FOLATE: Folate: 11.9 ng/mL (ref >5.9)

## 2019-10-13 LAB — CBC WITH DIFFERENTIAL/PLATELET
Abs Immature Granulocytes: 0.02 10*3/uL (ref 0.00–0.07)
Basophils Absolute: 0 10*3/uL (ref 0.0–0.1)
Basophils Relative: 0 %
Eosinophils Absolute: 0.1 10*3/uL (ref 0.0–0.5)
Eosinophils Relative: 2 %
HCT: 33.7 % — ABNORMAL LOW (ref 36.0–46.0)
Hemoglobin: 9.7 g/dL — ABNORMAL LOW (ref 12.0–15.0)
Immature Granulocytes: 0 %
Lymphocytes Relative: 20 %
Lymphs Abs: 1.1 10*3/uL (ref 0.7–4.0)
MCH: 24.6 pg — ABNORMAL LOW (ref 26.0–34.0)
MCHC: 28.8 g/dL — ABNORMAL LOW (ref 30.0–36.0)
MCV: 85.5 fL (ref 80.0–100.0)
Monocytes Absolute: 0.7 10*3/uL (ref 0.1–1.0)
Monocytes Relative: 14 %
Neutro Abs: 3.4 10*3/uL (ref 1.7–7.7)
Neutrophils Relative %: 64 %
Platelets: 206 10*3/uL (ref 150–400)
RBC: 3.94 MIL/uL (ref 3.87–5.11)
RDW: 16.3 % — ABNORMAL HIGH (ref 11.5–15.5)
WBC: 5.3 10*3/uL (ref 4.0–10.5)
nRBC: 0 % (ref 0.0–0.2)

## 2019-10-13 LAB — C-REACTIVE PROTEIN: CRP: 10.6 mg/dL — ABNORMAL HIGH (ref <1.0)

## 2019-10-13 LAB — LEGIONELLA PNEUMOPHILA SEROGP 1 UR AG: L. pneumophila Serogp 1 Ur Ag: NEGATIVE

## 2019-10-13 LAB — FIBRINOGEN: Fibrinogen: 436 mg/dL (ref 210–475)

## 2019-10-13 LAB — FERRITIN: Ferritin: 8 ng/mL — ABNORMAL LOW (ref 11–307)

## 2019-10-13 LAB — LACTATE DEHYDROGENASE: LDH: 192 U/L (ref 98–192)

## 2019-10-13 LAB — URINE CULTURE

## 2019-10-13 LAB — PHOSPHORUS: Phosphorus: 3.5 mg/dL (ref 2.5–4.6)

## 2019-10-13 LAB — IRON AND TIBC
Iron: 15 ug/dL — ABNORMAL LOW (ref 28–170)
Saturation Ratios: 5 % — ABNORMAL LOW (ref 10.4–31.8)
TIBC: 331 ug/dL (ref 250–450)
UIBC: 316 ug/dL

## 2019-10-13 LAB — MAGNESIUM: Magnesium: 2.1 mg/dL (ref 1.7–2.4)

## 2019-10-13 LAB — D-DIMER, QUANTITATIVE: D-Dimer, Quant: 2.73 ug/mL-FEU — ABNORMAL HIGH (ref 0.00–0.50)

## 2019-10-13 LAB — PROCALCITONIN: Procalcitonin: <0.1 ng/mL

## 2019-10-13 LAB — VITAMIN B12: Vitamin B-12: 186 pg/mL (ref 180–914)

## 2019-10-13 LAB — SEDIMENTATION RATE: Sed Rate: 52 mm/hr — ABNORMAL HIGH (ref 0–22)

## 2019-10-13 MED ORDER — ENOXAPARIN SODIUM 30 MG/0.3ML ~~LOC~~ SOLN
30.0000 mg | SUBCUTANEOUS | Status: DC
  Administered 2019-10-13 – 2019-10-17 (×5): 30 mg via SUBCUTANEOUS
  Filled 2019-10-13 (×5): qty 0.3

## 2019-10-13 MED ORDER — DEXAMETHASONE SODIUM PHOSPHATE 10 MG/ML IJ SOLN
10.0000 mg | Freq: Once | INTRAMUSCULAR | Status: AC
  Administered 2019-10-13: 10 mg via INTRAVENOUS
  Filled 2019-10-13: qty 1

## 2019-10-13 NOTE — Progress Notes (Signed)
PROGRESS NOTE    [name]  ZJQ:[id number] DOB: [birth year] DOA: 10/11/2019 PCP: [facility]   Brief Narrative:  The patient is a 33 year old Caucasian female with a past medical history significant for but not limited to spinal cord injury with spastic quadriplegia, chronic pain, asthma, pressure ulcers and recent COVID-19 infection along with chronic suprapubic catheter with recurrent UTIs who presented to the ED for evaluation of shortness of breath.  Of note she is admitted to the hospital last month with acute hypoxic respiratory failure secondary to COVID-19 pneumonia and was discharged on 09/15/2019 and reported that her fevers and cough had resolved and that her shortness of was much improved and that she tested negative for COVID-19 a week ago.   Subsequently she developed insidiously worsening shortness breath with orthopnea which has continued to worsen and she also describes paroxysmal nocturnal dyspnea and reported bilateral leg swelling that has been more chronic.  She has not noted any fever since her hospitalization last month and she does not use any supplemental oxygen via nasal cannula however T-max here was 100 in the last 48 hours and BNP was minimally elevated.  Of note she tested positive again for SARS-CoV-2 and came in with respiratory complaints.  Likely her respiratory complaints were  Initially attributable to heart failure however her ECHO showed no evidence of Heart Failure and Cardiology Agreed. She was diuresed and CXR continued to worsen and likely has reinfection of COVID-19 so will  be transferred [facility] and will restart Steroids while currently still in [facility].   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, buttock, and hip, stage 4 (HCC)   Chronic incomplete spastic tetraplegia (HCC)  Catheter-associated urinary tract infection (HCC)   Normochromic anemia   Chronic pain   Multifocal pneumonia   Lower extremity edema  Acute Hypoxic Respiratory Failurein the setting of Reoccurence of COVID 19 PNA; Heart Failure Ruled out -Presented with SOB, orthopnea, PND, and is found to have new supplemental O2 requirement, slightly elevated BNP, and multifocal infiltrates on CTA chest concerning for multifocal PNA -pH on admission was 7.336, PCO2 was 63.1, PO2 was 97.6, HCO3 was 32.8, and O2 saturation 96.9 -She had COVID-19 pneumonia in September but reports recovering from that and subsequently tested negativebut repeat here is POSITIVE  -History was most suggestive of CHFand BNP was elevated at 113.1 but Heart Failure has been ruled out and Cardiology agrees and recommends stopping diuresis as ECHO showed no Systolic or Diastolic CHF -Treat suspected PNA as below, checked  echocardiogram, follow daily wt and I/O's - Consulted Cardiology for further evaluation and recommendations and appreciated their input - Started Diuresis and given IV Lasix 40 mg but this has now bee stopped  -CTA showed "Negative for acute central embolus. Limited evaluation of distal vessels due to respiratory motion. Small left-sided pleural effusion. Multifocal consolidation involving the lower lobes and right upper lobe with ground-glass opacity in the right upper lobe, suspicious for multifocal pneumonia. Mild cardiomegaly." -Repeat CXR This AM showed  -SLP  Consulted and recommending Regular Solids and Thin Liquids  -Start IV Decadron again and given 10 mg x1 and will be dosed with 6 mg Daily and likely receive Remdesivir again  -Discussed with Dr. Bonner Puna at [facility] to be on 3 Liters of Supplemental O2 via [place]  SIRS -Presents with SOB, found  to have tachycardia and tachypnea, possible pneumonia vs CHF, possible UTI vs colonization, and mild cellulitis surrounding pressure ulcer  - Lactate  reassuringly normal 0.6 and procalcitonin level of less than 0.10 x2 -Blood and urine cultures ordered from ED, and she was treated with meropenem and vancomycin in ED -Continued antibiotics for suspected PNA with Rocephin and azithromycin but likely can stop now, check sputum culture, -check strep pneumo (Negative) and legionella antigens (Negative),  -check/trend procalcitonin, and follow clinical course -Check and trend inflammatory markers Daily  COVID 19 Disease -Recently Treated in September and Tested Positive at that time, negative a week ago and + Again this Hospitalization -Checked Inflammatory Markers and CRP elevated and went from 10.4 -> 10.6, PCT <0.10 x2, ESR 51-52, D-Dimer was 3.10 ->2.73, Fibrinogen was 616 -> 436 -Continues to be on 3 Liters of Supplemental O2 via [place]  -Was Diuresed and Clinical Picture shows likely repeat COVID Disease -Suspect this is a Re-infection given presentation -Will start Systemic Steroids and likely patient to get Remdesivir at [facility]  Normocyctic Anemia -Hgb is 8.6 on admission, down from 11.5 a month earlier; now hemoglobin/hematocrit is 9.7/33.7 -Anemia panel was checked and showed an iron level of 15, U IBC 316, TIBC 331, saturation actually 5%, ferritin level of 8, folate level of 11.9 -FOBT is negative, patient reported heavy menstrual bleeding -Type and screen -Continue to monitor for signs symptoms of bleeding -Repeat CBC in a.m.  Spastic quadriplegia; chronic pain -Continue home regimen with Darifenacin, Diazepam, Hydrocodone-Acetaminophen and Hydromorphone along with Tizanidine   Pressure ulcers, poA - There is surrounding erythema and heat, possible mild cellulitis, continue current antibiotics and wound care  - WOC Nurse consulted for further evaluation and recommendations  DVT prophylaxis: Lovenox sq q24h Code Status: FULL CODE Family Communication: Discussed with Spouse over the telephone Disposition Plan:  Transfer to [facility] for further evaluation and Treatment   Consultants:   Cardiology   Procedures:  ECHOCARDIOGRAM IMPRESSIONS    1. Left ventricular ejection fraction, by visual estimation, is 60 to 65%. The left ventricle has normal function. Normal left ventricular size. There is no left ventricular hypertrophy.  2. Global right ventricle has normal systolic function.The right ventricular size is normal. No increase in right ventricular wall thickness.  3. Left atrial size was normal.  4. Right atrial size was normal.  5. The mitral valve is normal in structure. No evidence of mitral valve regurgitation. No evidence of mitral stenosis.  6. The tricuspid valve is normal in structure. Tricuspid valve regurgitation was not visualized by color flow Doppler.  7. The aortic valve is tricuspid Aortic valve regurgitation was not visualized by color flow Doppler. Structurally normal aortic valve, with no evidence of sclerosis or stenosis.  8. The pulmonic valve was normal in structure. Pulmonic valve regurgitation is not visualized by color flow Doppler.  9. The inferior vena cava is normal in size with greater than 50% respiratory variability, suggesting right atrial pressure of 3 mmHg.  FINDINGS  Left Ventricle: Left ventricular ejection fraction, by visual estimation, is 60 to 65%. The left ventricle has normal function. No evidence of left ventricular regional wall motion abnormalities. There is no left ventricular hypertrophy. Normal left  ventricular size. Spectral Doppler shows Left ventricular diastolic parameters were normal pattern of LV diastolic filling.  Right Ventricle: The right ventricular size is normal. No increase in right ventricular wall thickness. Global RV systolic function is has normal systolic function.  Left Atrium: Left atrial size was normal  in size.  Right Atrium: Right atrial size was normal in size  Pericardium: There is no evidence of pericardial  effusion.  Mitral Valve: The mitral valve is normal in structure. No evidence of mitral valve stenosis by observation. No evidence of mitral valve regurgitation.  Tricuspid Valve: The tricuspid valve is normal in structure. Tricuspid valve regurgitation was not visualized by color flow Doppler.  Aortic Valve: The aortic valve is tricuspid. Aortic valve regurgitation was not visualized by color flow Doppler. The aortic valve is structurally normal, with no evidence of sclerosis or stenosis.  Pulmonic Valve: The pulmonic valve was normal in structure. Pulmonic valve regurgitation is not visualized by color flow Doppler.  Aorta: The aortic root, ascending aorta and aortic arch are all structurally normal, with no evidence of dilitation or obstruction.  Pulmonary Artery: The pulmonary artery is of normal size and origin.  Venous: The inferior vena cava is normal in size with greater than 50% respiratory variability, suggesting right atrial pressure of 3 mmHg.  IAS/Shunts: No atrial level shunt detected by color flow Doppler. No ventricular septal defect is seen or detected. There is no evidence of an atrial septal defect.     LEFT VENTRICLE PLAX 2D LVIDd:         4.16 cm  Diastology LVIDs:         2.82 cm  LV e' lateral:   24.10 cm/s LV PW:         0.74 cm  LV E/e' lateral: 4.9 LV IVS:        0.72 cm  LV e' medial:    12.30 cm/s LVOT diam:     1.60 cm  LV E/e' medial:  9.5 LV SV:         47 ml LV SV Index:   36.30 LVOT Area:     2.01 cm    RIGHT VENTRICLE RV S prime:     18.50 cm/s TAPSE (M-mode): 1.3 cm  LEFT ATRIUM           Index       RIGHT ATRIUM          Index LA diam:      2.10 cm 1.65 cm/m  RA Area:     9.77 cm LA Vol (A2C): 24.7 ml 19.35 ml/m RA Volume:   16.90 ml 13.24 ml/m LA Vol (A4C): 26.4 ml 20.69 ml/m  AORTIC VALVE LVOT Vmax:   142.00 cm/s LVOT Vmean:  95.400 cm/s LVOT VTI:    0.236 m   AORTA Ao Root diam: 2.30 cm  MITRAL VALVE MV Area  (PHT): 6.71 cm              SHUNTS MV PHT:        32.77 msec            Systemic VTI:  0.24 m MV Decel Time: 113 msec              Systemic Diam: 1.60 cm MV E velocity: 117.00 cm/s 103 cm/s MV A velocity: 88.80 cm/s  70.3 cm/s MV E/A ratio:  1.32        1.5    Antimicrobials:  Anti-infectives (From admission, onward)   Start     Dose/Rate Route Frequency Ordered Stop   10/12/19 0315  cefTRIAXone (ROCEPHIN) 2 g in sodium chloride 0.9 % 100 mL IVPB     2 g 200 mL/hr over 30 Minutes Intravenous Daily at bedtime 10/12/19 0256 10/16/19 2159   10/12/19 0315  azithromycin (ZITHROMAX) 500 mg in sodium chloride 0.9 % 250 mL IVPB     500 mg 250 mL/hr over 60 Minutes Intravenous Daily at bedtime 10/12/19 0256 10/16/19 2159   10/12/19 0045  meropenem (MERREM) 1 g in sodium chloride 0.9 % 100 mL IVPB     1 g 200 mL/hr over 30 Minutes Intravenous  Once 10/12/19 0031 10/12/19 0407   10/12/19 0030  aztreonam (AZACTAM) 2 g in sodium chloride 0.9 % 100 mL IVPB  Status:  Discontinued     2 g 200 mL/hr over 30 Minutes Intravenous  Once 10/12/19 0021 10/12/19 0031   10/12/19 0030  vancomycin (VANCOCIN) IVPB 1000 mg/200 mL premix     1,000 mg 200 mL/hr over 60 Minutes Intravenous  Once 10/12/19 0028 10/12/19 0234     Subjective: Seen and examined at bedside and still follow short of breath and denies any chest pain, lightheadedness or dizziness and continues to have some leg swelling.  States that she is feeling a little bit better respiratory wise but not significantly much.  Happy to hear that she is not in heart failure.  No other concerns or complaints at this time.  Objective: Vitals:   10/12/19 1246 10/12/19 2303 10/13/19 0613 10/13/19 1305  BP: 98/90 (!) 82/41 (!) 111/56 (!) 91/56  Pulse: 88 78 75 76  Resp: 18 16 16 16   Temp: 100 F (37.8 C) 98.2 F (36.8 C) 98 F (36.7 C) 98.6 F (37 C)  TempSrc: Oral   Axillary  SpO2: 99% 100% 100% 96%  Weight:      Height:        Intake/Output  Summary (Last 24 hours) at 10/13/2019 1629 Last data filed at 10/13/2019 1113 Gross per 24 hour  Intake 619.1 ml  Output 1875 ml  Net -1255.9 ml   Filed Weights   10/12/19 0135  Weight: 41.7 kg   Examination: Physical Exam:  Constitutional: Thin Caucasian female in NAD and appears calm  Eyes: Lids and conjunctivae normal, sclerae anicteric  ENMT: External Ears, Nose appear normal. Grossly normal hearing. Mucous membranes are moist.  Neck: Appears normal, supple, no cervical masses, normal ROM, no appreciable thyromegaly; no JVD Respiratory: Diminished to auscultation bilaterally with coarse breath sound but no appreciable wheezing or crackles. Normal respiratory effort and patient is not tachypenic. No accessory muscle use. Wearing Supplemental O2 via [place] Cardiovascular: RRR, no murmurs / rubs / gallops. S1 and S2 auscultated. 1-2+ LE Edema which is pitting  Abdomen: Soft, non-tender, non-distended. Bowel sounds positive x4.  GU: Deferred. Has a chronic foley  Musculoskeletal: Incomplete spastic Quadriplegic.  Skin: Has some sacral and pressure ulcers and diffuse tattoos scattered throughout the body. No induration; Warm and dry.  Neurologic: CN 2-12 grossly intact with no focal deficits. Romberg sign and cerebellar reflexes not assessed.  Psychiatric: Normal judgment and insight. Alert and oriented x 3. Anxious mood and appropriate affect.   Data Reviewed: I have personally reviewed following labs and imaging studies  CBC: Recent Labs  Lab 10/11/19 2200 10/12/19 0431 10/13/19 0417  WBC 9.3 6.9 5.3  NEUTROABS 6.3 4.9 3.4  HGB 8.6* 9.2* 9.7*  HCT 29.7* 32.4* 33.7*  MCV 84.1 86.9 85.5  PLT 239 235 931   Basic Metabolic Panel: Recent Labs  Lab 10/11/19 2051 10/12/19 0431 10/13/19 0417  NA 138 136 136  K 4.9 4.4 4.2  CL 100 101 94*  CO2 31 28 29   GLUCOSE 97 84 86  BUN 10 9 8  CREATININE 0.46 0.37* 0.40*  CALCIUM 8.3* 8.1* 8.1*  MG  --   --  2.1  PHOS  --   --  3.5     GFR: Estimated Creatinine Clearance: 57.3 mL/min (A) (by C-G formula based on SCr of 0.4 mg/dL (L)). Liver Function Tests: Recent Labs  Lab 10/11/19 2051 10/13/19 0417  AST 19 25  ALT 19 26  ALKPHOS 62 59  BILITOT 0.2* 0.6  PROT 7.2 6.6  ALBUMIN 3.0* 2.8*   No results for input(s): LIPASE, AMYLASE in the last 168 hours. No results for input(s): AMMONIA in the last 168 hours. Coagulation Profile: Recent Labs  Lab 10/11/19 2200  INR 1.0   Cardiac Enzymes: No results for input(s): CKTOTAL, CKMB, CKMBINDEX, TROPONINI in the last 168 hours. BNP (last 3 results) No results for input(s): PROBNP in the last 8760 hours. HbA1C: No results for input(s): HGBA1C in the last 72 hours. CBG: No results for input(s): GLUCAP in the last 168 hours. Lipid Profile: No results for input(s): CHOL, HDL, LDLCALC, TRIG, CHOLHDL, LDLDIRECT in the last 72 hours. Thyroid Function Tests: No results for input(s): TSH, T4TOTAL, FREET4, T3FREE, THYROIDAB in the last 72 hours. Anemia Panel: Recent Labs    10/12/19 1438 10/13/19 0417  VITAMINB12  --  186  FOLATE  --  11.9  FERRITIN 7* 8*  TIBC  --  331  IRON  --  15*  RETICCTPCT  --  3.2*   Sepsis Labs: Recent Labs  Lab 10/11/19 2100 10/11/19 2201 10/12/19 0431 10/13/19 0417  PROCALCITON  --   --  <0.10 <0.10  LATICACIDVEN 0.7 0.6  --   --     Recent Results (from the past 240 hour(s))  Novel Coronavirus, NAA ([facility])     Status: None   Collection Time: 10/07/19 12:53 PM   Specimen: Nasopharyngeal(NP) swabs in vial transport medium   NASOPHARYNGE  TESTING  Result Value Ref Range Status   SARS-CoV-2, NAA Not Detected Not Detected Final    Comment: This nucleic acid amplification test was developed and its performance characteristics determined by [employer]. Nucleic acid amplification tests include PCR and TMA. This test has not been FDA cleared or approved. This test has been authorized by FDA under an Emergency Use  Authorization (EUA). This test is only authorized for the duration of time the declaration that circumstances exist justifying the authorization of the emergency use of in vitro diagnostic tests for detection of SARS-CoV-2 virus and/or diagnosis of COVID-19 infection under section 564(b)(1) of the Act, 21 U.S.C. 295JOA-4(Z) (1), unless the authorization is terminated or revoked sooner. When diagnostic testing is negative, the possibility of a false negative result should be considered in the context of a patient's recent exposures and the presence of clinical signs and symptoms consistent with COVID-19. An individual without symptoms of COVID-19 and who is not shedding SARS-CoV-2 virus would  expect to have a negative (not detected) result in this assay.   Blood Culture (routine x 2)     Status: None (Preliminary result)   Collection Time: 10/11/19  8:06 PM   Specimen: BLOOD RIGHT HAND  Result Value Ref Range Status   Specimen Description   Final    BLOOD RIGHT HAND Performed at [facility] [address] [place], [place] [address]    Special Requests   Final    BOTTLES DRAWN AEROBIC ONLY Blood Culture results may not be optimal due to an inadequate volume of blood received in culture  bottles Performed at [facility] [address], [place], [place] [address]    Culture   Final    NO GROWTH 1 DAY Performed at [facility], [place] [address], [place], [place] [address]    Report Status PENDING  Incomplete  Blood Culture (routine x 2)     Status: None (Preliminary result)   Collection Time: 10/11/19  8:51 PM   Specimen: BLOOD  Result Value Ref Range Status   Specimen Description   Final    BLOOD BLOOD LEFT FOREARM Performed at [facility] [address] [place], [place] [address]    Special Requests   Final    BOTTLES DRAWN AEROBIC ONLY Blood Culture adequate volume Performed at [facility] [address], [place], [place] [address]    Culture   Final    NO GROWTH 2 DAYS Performed at [facility] [address], [place], [place] [address]    Report Status PENDING  Incomplete  Urine culture     Status: Abnormal   Collection Time: 10/11/19  9:00 PM   Specimen: In/Out Cath Urine  Result Value Ref Range Status   Specimen Description   Final    IN/OUT CATH URINE Performed at [facility] [address], [place], [place] [address]    Special Requests   Final    NONE Performed at [facility], [place] [address] [place], [place] [address]    Culture MULTIPLE SPECIES PRESENT, SUGGEST RECOLLECTION (A)  Final   Report Status 10/13/2019 FINAL  Final  SARS CORONAVIRUS 2 (TAT 6-24 HRS) Nasopharyngeal Nasopharyngeal Swab     Status: Abnormal   Collection Time: 10/11/19  9:13 PM   Specimen: Nasopharyngeal Swab  Result Value Ref Range Status   SARS Coronavirus 2 POSITIVE (A) NEGATIVE Final    Comment: RESULT CALLED TO, READ BACK BY AND VERIFIED WITH: Franne Grip RN 13:40 10/12/19 ([email or web address]) (NOTE) SARS-CoV-2 target nucleic acids are DETECTED. The SARS-CoV-2 RNA is generally detectable in upper and lower respiratory specimens during the acute phase of infection. Positive results are indicative of active infection with SARS-CoV-2. Clinical  correlation with patient history and other diagnostic information is necessary to determine patient infection status. Positive results do  not rule out bacterial infection or co-infection with other viruses. The expected result is Negative. Fact Sheet for Patients: SugarRoll.be Fact Sheet for Healthcare Providers: https://www.woods-mathews.com/ This test is not yet approved or cleared by the [place] FDA and  has been authorized for detection and/or diagnosis of SARS-CoV-2 by FDA under an Emergency Use Authorization (EUA). This EUA will remain  in effect  (meaning this test can be used) fo r the duration of the COVID-19 declaration under Section 564(b)(1) of the Act, 21 U.S.C. section 360bbb-3(b)(1), unless the authorization is terminated or revoked sooner. Performed at [facility], [place] [address], [place], [place] [address]     RN Pressure Injury Documentation: Pressure Injury 01/19/18 Stage II -  Partial thickness loss of dermis presenting as a shallow open ulcer with a red, pink wound bed without slough. (Active)  01/19/18 0500  Location: Hip  Location Orientation: Anterior;Left;Proximal;Upper  Staging: Stage II -  Partial thickness loss of dermis presenting as a shallow open ulcer with a red, pink wound bed without slough.  Wound Description (Comments):   Present on Admission: Yes     Pressure Injury 07/09/18 Stage III -  Full thickness tissue loss.  Subcutaneous fat may be visible but bone, tendon or muscle are NOT exposed. labial folds, perineum (Active)  07/09/18 2030  Location: Other (Comment)  Location Orientation: Left  Staging: Stage III -  Full thickness tissue loss. Subcutaneous fat may be visible but bone, tendon or muscle are NOT exposed.  Wound Description (Comments): labial folds, perineum  Present on Admission: Yes     Pressure Injury 07/09/18 Stage III -  Full thickness tissue loss. Subcutaneous fat may be visible but bone, tendon or muscle are NOT exposed. per patient area of extra skin  (Active)  07/09/18 2030  Location: Hip  Location Orientation: Left  Staging: Stage III -  Full thickness tissue loss. Subcutaneous fat may be visible but bone, tendon or muscle are NOT exposed.  Wound Description (Comments): per patient area of extra skin   Present on Admission: Yes     Pressure Injury 07/09/18 per patient pressure wound from where laptop rests in her lap  (Active)  07/09/18 2030  Location: Thigh  Location Orientation: Right  Staging:   Wound Description (Comments): per patient pressure wound from where  laptop rests in her lap   Present on Admission: Yes     Pressure Injury 07/09/18 Deep Tissue Injury - Purple or maroon localized area of discolored intact skin or blood-filled blister due to damage of underlying soft tissue from pressure and/or shear. (Active)  07/09/18 1734  Location: Heel  Location Orientation: Left  Staging: Deep Tissue Injury - Purple or maroon localized area of discolored intact skin or blood-filled blister due to damage of underlying soft tissue from pressure and/or shear.  Wound Description (Comments):   Present on Admission: Yes     Pressure Injury 07/12/18 Deep Tissue Injury - Purple or maroon localized area of discolored intact skin or blood-filled blister due to damage of underlying soft tissue from pressure and/or shear. (Active)  07/12/18 1737  Location: Heel  Location Orientation: Right  Staging: Deep Tissue Injury - Purple or maroon localized area of discolored intact skin or blood-filled blister due to damage of underlying soft tissue from pressure and/or shear.  Wound Description (Comments):   Present on Admission: Yes     Pressure Injury 09/08/19 Ischial tuberosity Left Stage III -  Full thickness tissue loss. Subcutaneous fat may be visible but bone, tendon or muscle are NOT exposed. red, draining (Active)  09/08/19 1913  Location: Ischial tuberosity  Location Orientation: Left  Staging: Stage III -  Full thickness tissue loss. Subcutaneous fat may be visible but bone, tendon or muscle are NOT exposed.  Wound Description (Comments): red, draining  Present on Admission: Yes     Pressure Injury Ischial tuberosity Right Stage III -  Full thickness tissue loss. Subcutaneous fat may be visible but bone, tendon or muscle are NOT exposed. red, draining (Active)     Location: Ischial tuberosity  Location Orientation: Right  Staging: Stage III -  Full thickness tissue loss. Subcutaneous fat may be visible but bone, tendon or muscle are NOT exposed.  Wound  Description (Comments): red, draining  Present on Admission: Yes     Pressure Injury 09/08/19 Sacrum Stage I -  Intact skin with non-blanchable redness of a localized area usually over a bony prominence. pink, unblanchable (Active)  09/08/19 1914  Location: Sacrum  Location Orientation:   Staging: Stage I -  Intact skin with non-blanchable redness of a localized area usually over a bony prominence.  Wound Description (Comments): pink, unblanchable  Present on Admission: Yes  Radiology Studies: Ct Angio Chest Pe W And/or Wo Contrast  Result Date: 10/11/2019 CLINICAL DATA:  Hypoxia abnormal chest x-ray EXAM: CT ANGIOGRAPHY CHEST WITH CONTRAST TECHNIQUE: Multidetector CT imaging of the chest was performed using the standard protocol during bolus administration of intravenous contrast. Multiplanar CT image reconstructions and MIPs were obtained to evaluate the vascular anatomy. CONTRAST:  96m OMNIPAQUE IOHEXOL 350 MG/ML SOLN COMPARISON:  Chest x-ray 10/11/2019, CT 05/24/2019 FINDINGS: Cardiovascular: Respiratory motion degrades the study. Limited evaluation for subsegmental emboli within the bilateral lungs. No acute filling defects within the central or main segmental pulmonary arteries. Nonaneurysmal aorta. Mild cardiomegaly. No significant pericardial effusion Mediastinum/Nodes: Midline trachea. No thyroid mass. No increasing adenopathy. Esophagus within normal limits Lungs/Pleura: Small left-sided pleural effusion. Consolidation in the left greater than right lower lobes. Patchy consolidation and ground-glass density in the right upper lobe. Upper Abdomen: No acute abnormality Musculoskeletal: Scoliosis and vertebral anomalies. No acute or suspicious osseous abnormality. Review of the MIP images confirms the above findings. IMPRESSION: 1. Negative for acute central embolus. Limited evaluation of distal vessels due to respiratory motion 2. Small left-sided pleural effusion. Multifocal consolidation  involving the lower lobes and right upper lobe with ground-glass opacity in the right upper lobe, suspicious for multifocal pneumonia. 3. Mild cardiomegaly Electronically Signed   By: KDonavan FoilM.D.   On: 10/11/2019 23:44   Dg Chest Port 1 View  Result Date: 10/13/2019 CLINICAL DATA:  Shortness of breath.  COVID-19 positive. EXAM: PORTABLE CHEST 1 VIEW COMPARISON:  Radiographs yesterday. Chest CT 10/11/2019 FINDINGS: Low lung volumes persist. Slight worsening bilateral airspace disease. Left pleural effusion unchanged. Unchanged heart size and mediastinal contours. No pneumothorax. Cervical and thoracic spinal fusion hardware intact. IMPRESSION: Slight worsening bilateral airspace disease. Unchanged left pleural effusion. Electronically Signed   By: MKeith RakeM.D.   On: 10/13/2019 05:30   Dg Chest Port 1 View  Result Date: 10/12/2019 CLINICAL DATA:  Covert positive with history of weakness. EXAM: PORTABLE CHEST 1 VIEW COMPARISON:  10/11/2019 FINDINGS: Heart size remains enlarged with dense retrocardiac opacification. Patchy opacities bilaterally similar to prior study. Signs of thoracic to upper lumbar spinal fusion and spinal curvature are unchanged. No acute bone process. IMPRESSION: No interval change in the appearance of the chest since 10/20/202020/2012. Electronically Signed   By: GZetta BillsM.D.   On: 10/12/2019 10:21   Dg Chest Port 1 View  Result Date: 10/11/2019 CLINICAL DATA:  Orthopnea EXAM: PORTABLE CHEST 1 VIEW COMPARISON:  September 11, 2019 FINDINGS: There is cardiomegaly. Pulmonary vascular congestion is seen. There is hazy ground-glass opacity in the right mid lung. There is blunting of the left costophrenic angle which could be due to a small effusion. Overlying surgical fixation hardware. IMPRESSION: 1. Pulmonary vascular congestion 2. Hazy airspace opacity in the right mid lung which is non-specific could be due to edema, atelectasis, and/or early infectious  etiology. 3. Probable trace left pleural effusion Electronically Signed   By: BPrudencio PairM.D.   On: 10/11/2019 20:40    Scheduled Meds:  darifenacin  15 mg Oral Daily   dexamethasone (DECADRON) injection  10 mg Intravenous Once   neomycin-bacitracin-polymyxin   Topical Daily   pantoprazole  40 mg Oral Daily   pregabalin  75 mg Oral TID   sodium chloride flush  3 mL Intravenous Q12H   sodium chloride flush  3 mL Intravenous Q12H   tiZANidine  2 mg Oral QPM   Continuous Infusions:  sodium chloride  azithromycin 500 mg (10/13/19 0008)   cefTRIAXone (ROCEPHIN)  IV 2 g (10/12/19 2307)    LOS: 1 day   Kerney Elbe, DO [facility] PAGER is on [place]  If 7PM-7AM, please contact night-coverage [email or web address] Password [facility] 10/13/2019, 4:29 PM
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
## 2019-10-13 NOTE — Progress Notes (Signed)
Report called to [place] at [facility] for pt transfer from rm [phone] to [phone]

## 2019-10-14 ENCOUNTER — Inpatient Hospital Stay (HOSPITAL_COMMUNITY): Payer: Medicare Other

## 2019-10-14 DIAGNOSIS — N390 Urinary tract infection, site not specified: Secondary | ICD-10-CM

## 2019-10-14 DIAGNOSIS — T83511D Infection and inflammatory reaction due to indwelling urethral catheter, subsequent encounter: Secondary | ICD-10-CM

## 2019-10-14 MED ORDER — SODIUM CHLORIDE 0.9 % IV SOLN
100.0000 mg | INTRAVENOUS | Status: AC
  Administered 2019-10-14 – 2019-10-18 (×5): 100 mg via INTRAVENOUS
  Filled 2019-10-14 (×5): qty 20

## 2019-10-14 MED ORDER — GUAIFENESIN-DM 100-10 MG/5ML PO SYRP: 5.0000 mL | ORAL_SOLUTION | ORAL | Status: DC | PRN

## 2019-10-14 MED ORDER — DEXAMETHASONE SODIUM PHOSPHATE 10 MG/ML IJ SOLN
6.0000 mg | INTRAMUSCULAR | Status: DC
  Administered 2019-10-14 – 2019-10-18 (×5): 6 mg via INTRAVENOUS
  Filled 2019-10-14 (×5): qty 1

## 2019-10-14 NOTE — Plan of Care (Signed)
Continue current POC 

## 2019-10-14 NOTE — Progress Notes (Signed)
PROGRESS NOTE  [name]  CBS:[id number] DOB: 1986/10/04 DOA: 10/11/2019 PCP: [facility]   Brief Narrative: [name] is a 33 y.o. 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 Since discharge she had been feeling well, had confirmed negative test as an outpatient 10/16 but began noticing progressive shortness of breath and orthopnea and acute on chronic leg swelling. In the ED she was afebrile, tachypneic, with CTA chest showing no large PE but did reveal new left pleural effusion and scattered consolidations suspicious for multifocal pneumonia. SAR-CoV-2 PCR was again positive. BNP was elevated, so she was admitted at [facility] for clinical diagnosis of heart failure, given lasix with brisk diuresis. Echocardiogram showed no evidence of systolic or diastolic dysfunction and a normal IVC. Cardiology consultant did not suspect heart failure to be cause of symptoms. Speech therapy evaluation ordered and demonstrated no aspiration risk. Despite diuresis, she grew more hypoxic and CXR confirmed worsening bilateral airspace disease. CRP noted to be elevated at 10.4 (had normalized by time of previous discharge), PCT undetectable, and  Hospitalist conferred with [facility] MD who recommended initiating steroids and transferring to [facility].   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 No large PE on CTA chest. Echocardiogram does not implicate cardiogenic cause to hypoxia. Elevated inflammatory markers and exposure to mother-in-law who is not regularly following masking/social distancing guidelines makes recrudescence or reinfection with covid possible. Organizing pneumonia also a possibility.  - Continue steroids, seems to have responded clinically to initiation of decadron 10/22.  - Start 5 days of remdesivir, potential benefit felt to outweigh risk at this time.  - Trend inflammatory markers - Keep negative balance - Incentive spirometry very important as atelectasis is likely to contribute to symptoms.  - Antitussives as ordered - Monitor sputum culture, though with negative blood cultures to date, undetectable PCT, will stop abx.  Neurogenic bladder, chronic suprapubic catheter: Nonclonal urine culture.  - Continue routine catheter care.   Spastic incomplete quadriplegia s/p spinal cord injury at age 62: Some use of hands, contractures noted.  - Continue home medications - Turn q2h, assist as needed for hygiene  Chronic pain s/p significant spinal surgeries:  - Continue home regimen  Anxiety:  - Continue valium  Acute blood loss anemia, vaginal bleeding:  - Hgb down but has stabilized, hemostatic.  Pressure injuries as listed below: All POA.  - Offload as able - WOC consulted - Continue regular follow up with wound care center (chronic patient there)  RN Pressure Injury Documentation: Pressure Injury 01/19/18 Stage II -  Partial thickness loss of dermis presenting as a shallow open ulcer with a red, pink wound bed without slough. (Active)  01/19/18 0500  Location: Hip  Location Orientation: Anterior;Left;Proximal;Upper  Staging: Stage II -  Partial thickness loss of dermis presenting as a shallow open ulcer with a red, pink wound bed  without slough.    Wound Description (Comments):   Present on Admission: Yes     Pressure Injury 07/09/18 Stage III -  Full thickness tissue loss. Subcutaneous fat may be visible but bone, tendon or muscle are NOT exposed. labial folds, perineum (Active)  07/09/18 2030  Location: Other (Comment)  Location Orientation: Left  Staging: Stage III -  Full thickness tissue loss. Subcutaneous fat may be visible but bone, tendon or muscle are NOT exposed.  Wound Description (Comments): labial folds, perineum  Present on Admission: Yes     Pressure Injury 07/09/18 Stage III -  Full thickness tissue loss. Subcutaneous fat may be visible but bone, tendon or muscle are NOT exposed. per patient area of extra skin  (Active)  07/09/18 2030  Location: Hip  Location Orientation: Left  Staging: Stage III -  Full thickness tissue loss. Subcutaneous fat may be visible but bone, tendon or muscle are NOT exposed.  Wound Description (Comments): per patient area of extra skin   Present on Admission: Yes     Pressure Injury 07/09/18 per patient pressure wound from where laptop rests in her lap  (Active)  07/09/18 2030  Location: Thigh  Location Orientation: Right  Staging:   Wound Description (Comments): per patient pressure wound from where laptop rests in her lap   Present on Admission: Yes     Pressure Injury 07/09/18 Deep Tissue Injury - Purple or maroon localized area of discolored intact skin or blood-filled blister due to damage of underlying soft tissue from pressure and/or shear. (Active)  07/09/18 1734  Location: Heel  Location Orientation: Left  Staging: Deep Tissue Injury - Purple or maroon localized area of discolored intact skin or blood-filled blister due to damage of underlying soft tissue from pressure and/or shear.  Wound Description (Comments):   Present on Admission: Yes     Pressure Injury 07/12/18 Deep Tissue Injury - Purple or maroon localized area of discolored intact skin or blood-filled blister  due to damage of underlying soft tissue from pressure and/or shear. (Active)  07/12/18 1737  Location: Heel  Location Orientation: Right  Staging: Deep Tissue Injury - Purple or maroon localized area of discolored intact skin or blood-filled blister due to damage of underlying soft tissue from pressure and/or shear.  Wound Description (Comments):   Present on Admission: Yes     Pressure Injury 09/08/19 Ischial tuberosity Left Stage III -  Full thickness tissue loss. Subcutaneous fat may be visible but bone, tendon or muscle are NOT exposed. red, draining (Active)  09/08/19 1913  Location: Ischial tuberosity  Location Orientation: Left  Staging: Stage III -  Full thickness tissue loss. Subcutaneous fat may be visible but bone, tendon or muscle are NOT exposed.  Wound Description (Comments): red, draining  Present on Admission: Yes     Pressure Injury Ischial tuberosity Right Stage III -  Full thickness tissue loss. Subcutaneous fat may be visible but bone, tendon or muscle are NOT exposed. red, draining (Active)     Location: Ischial tuberosity  Location Orientation: Right  Staging: Stage III -  Full thickness tissue loss. Subcutaneous fat may be visible but bone, tendon or muscle are NOT exposed.  Wound Description (Comments): red, draining  Present on Admission: Yes     Pressure Injury 09/08/19 Sacrum Stage I -  Intact skin with non-blanchable redness of a localized area usually over a bony prominence. pink, unblanchable (Active)  09/08/19 1914  Location: Sacrum  Location Orientation:   Staging: Stage I -  Intact skin with non-blanchable redness of a localized area usually over a bony prominence.  Wound Description (Comments): pink, unblanchable  Present on Admission: Yes   DVT prophylaxis: Lovenox 30mg , Pt also has IVC filter in place. Code Status: Full Family Communication: None at bedside Disposition Plan: Uncertain, pending clinical course.   Consultants:   Dr. Oval Linsey,  Cardiology  Procedures:   Echocardiogram 10/13/2019: 1. Left ventricular ejection fraction, by visual estimation, is 60 to 65%. The left ventricle has normal function. Normal left ventricular size. There is no left ventricular hypertrophy. 2. Global right ventricle has normal systolic function.The right ventricular size is normal. No increase in right ventricular wall thickness. 3. Left atrial size was normal. 4. Right atrial size was normal. 5. The mitral valve is normal in structure. No evidence of mitral valve regurgitation. No evidence of mitral stenosis. 6. The tricuspid valve is normal in structure. Tricuspid valve regurgitation was not visualized by color flow Doppler. 7. The aortic valve is tricuspid Aortic valve regurgitation was not visualized by color flow Doppler. Structurally normal aortic valve, with no evidence of sclerosis or stenosis. 8. The pulmonic valve was normal in structure. Pulmonic valve regurgitation is not visualized by color flow Doppler. 9. The inferior vena cava is normal in size with greater than 50% respiratory variability, suggesting right atrial pressure of 3 mmHg.  Antimicrobials:  Ceftriaxone, azithromycin 10/20 - 10/22  Vancomycin, meropenem 10/20   Remdesivir 9/20 - 9/24; restarted 10/23 - 10/27.  Subjective: Feels shortness of breath, orthopnea, and cough have improved since yesterday afternoon after receiving steroid. Denies current bleeding. Leg swelling is at approximate chronic baseline. No chest pain.   Objective: Vitals:   10/14/19 0055 10/14/19 0400 10/14/19 0500 10/14/19 0833  BP: 137/87 (!) 130/94  139/77  Pulse: 61 70 75 92  Resp: (!) 21 (!) 24 (!) 27 20  Temp:   98.9 F (37.2 C) 99.9 F (37.7 C)  TempSrc:   Oral Oral  SpO2: 100% 100% 98% 98%  Weight:      Height:        Intake/Output Summary (Last 24 hours) at 10/14/2019 1354 Last data filed at 10/14/2019 1108 Gross per 24 hour  Intake 995.68 ml  Output 550 ml    Net 445.68 ml   Filed Weights   10/12/19 0135  Weight: 41.7 kg   Gen: Frail female in no distress Pulm: Nonlabored breathing supplemental oxygen. Scant crackles bilaterally without wheezes, decreased at left base. CV: Regular rate and rhythm. No murmur, rub, or gallop. No JVD, 1+ dependent edema. GI: Abdomen soft, non-tender, non-distended, with normoactive bowel sounds.  Ext: Warm, Spasticity x4 extremities Skin: No new rashes, lesions or ulcers on visualized skin. Back exam deferred. Neuro: Alert and oriented. Incomplete paraplegia. No focal neurological deficits. Psych: Judgement and insight appear fair. Mood euthymic & affect congruent. Behavior is appropriate.    Data Reviewed: I have personally reviewed following labs and imaging studies  CBC: Recent Labs  Lab 10/11/19 2200 10/12/19 0431 10/13/19 0417  WBC 9.3 6.9 5.3  NEUTROABS 6.3 4.9 3.4  HGB 8.6* 9.2* 9.7*  HCT 29.7* 32.4* 33.7*  MCV 84.1 86.9 85.5  PLT 239 235 854   Basic Metabolic Panel: Recent Labs  Lab 10/11/19 2051 10/12/19 0431 10/13/19 0417  NA 138 136 136  K 4.9 4.4 4.2  CL 100 101 94*  CO2 31 28 29   GLUCOSE 97 84 86  BUN 10 9 8   CREATININE 0.46 0.37* 0.40*  CALCIUM 8.3*  8.1* 8.1*  MG  --   --  2.1  PHOS  --   --  3.5   GFR: Estimated Creatinine Clearance: 57.3 mL/min (A) (by C-G formula based on SCr of 0.4 mg/dL (L)). Liver Function Tests: Recent Labs  Lab 10/11/19 2051 10/13/19 0417  AST 19 25  ALT 19 26  ALKPHOS 62 59  BILITOT 0.2* 0.6  PROT 7.2 6.6  ALBUMIN 3.0* 2.8*   No results for input(s): LIPASE, AMYLASE in the last 168 hours. No results for input(s): AMMONIA in the last 168 hours. Coagulation Profile: Recent Labs  Lab 10/11/19 2200  INR 1.0   Cardiac Enzymes: No results for input(s): CKTOTAL, CKMB, CKMBINDEX, TROPONINI in the last 168 hours. BNP (last 3 results) No results for input(s): PROBNP in the last 8760 hours. HbA1C: No results for input(s): HGBA1C in the  last 72 hours. CBG: No results for input(s): GLUCAP in the last 168 hours. Lipid Profile: No results for input(s): CHOL, HDL, LDLCALC, TRIG, CHOLHDL, LDLDIRECT in the last 72 hours. Thyroid Function Tests: No results for input(s): TSH, T4TOTAL, FREET4, T3FREE, THYROIDAB in the last 72 hours. Anemia Panel: Recent Labs    10/12/19 1438 10/13/19 0417  VITAMINB12  --  186  FOLATE  --  11.9  FERRITIN 7* 8*  TIBC  --  331  IRON  --  15*  RETICCTPCT  --  3.2*   Urine analysis:    Component Value Date/Time   COLORURINE AMBER (A) 10/11/2019 2100   APPEARANCEUR CLOUDY (A) 10/11/2019 2100   LABSPEC 1.026 10/11/2019 2100   PHURINE 5.0 10/11/2019 2100   GLUCOSEU NEGATIVE 10/11/2019 2100   HGBUR NEGATIVE 10/11/2019 2100   BILIRUBINUR NEGATIVE 10/11/2019 2100   KETONESUR NEGATIVE 10/11/2019 2100   PROTEINUR 100 (A) 10/11/2019 2100   UROBILINOGEN 0.2 08/03/2015 1714   NITRITE POSITIVE (A) 10/11/2019 2100   LEUKOCYTESUR LARGE (A) 10/11/2019 2100   Recent Results (from the past 240 hour(s))  Novel Coronavirus, NAA ([facility])     Status: None   Collection Time: 10/07/19 12:53 PM   Specimen: Nasopharyngeal(NP) swabs in vial transport medium   NASOPHARYNGE  TESTING  Result Value Ref Range Status   SARS-CoV-2, NAA Not Detected Not Detected Final    Comment: This nucleic acid amplification test was developed and its performance characteristics determined by [employer]. Nucleic acid amplification tests include PCR and TMA. This test has not been FDA cleared or approved. This test has been authorized by FDA under an Emergency Use Authorization (EUA). This test is only authorized for the duration of time the declaration that circumstances exist justifying the authorization of the emergency use of in vitro diagnostic tests for detection of SARS-CoV-2 virus and/or diagnosis of COVID-19 infection under section 564(b)(1) of the Act, 21 U.S.C. 621HYQ-6(V) (1), unless the authorization is  terminated or revoked sooner. When diagnostic testing is negative, the possibility of a false negative result should be considered in the context of a patient's recent exposures and the presence of clinical signs and symptoms consistent with COVID-19. An individual without symptoms of COVID-19 and who is not shedding SARS-CoV-2 virus would  expect to have a negative (not detected) result in this assay.   Blood Culture (routine x 2)     Status: None (Preliminary result)   Collection Time: 10/11/19  8:06 PM   Specimen: BLOOD RIGHT HAND  Result Value Ref Range Status   Specimen Description   Final    BLOOD RIGHT HAND Performed at [facility]  [facility], [address] [place], [place] [address]    Special Requests   Final    BOTTLES DRAWN AEROBIC ONLY Blood Culture results may not be optimal due to an inadequate volume of blood received in culture bottles Performed at [facility], [address] [place], [place] [address]    Culture   Final    NO GROWTH 2 DAYS Performed at [facility], [address] [place], [place] [address]    Report Status PENDING  Incomplete  Blood Culture (routine x 2)     Status: None (Preliminary result)   Collection Time: 10/11/19  8:51 PM   Specimen: BLOOD  Result Value Ref Range Status   Specimen Description   Final    BLOOD BLOOD LEFT FOREARM Performed at [facility], [address] [place], [place] [address]    Special Requests   Final    BOTTLES DRAWN AEROBIC ONLY Blood Culture adequate volume Performed at [facility], [address] [place], [place] [address]    Culture   Final    NO GROWTH 3 DAYS Performed at [facility], [address] [place], [place] [address]    Report Status PENDING  Incomplete  Urine culture     Status: Abnormal   Collection Time: 10/11/19  9:00 PM   Specimen: In/Out Cath Urine  Result Value Ref Range Status   Specimen  Description   Final    IN/OUT CATH URINE Performed at [facility], [address] [place], [place] [address]    Special Requests   Final    NONE Performed at [facility], [address] [place], [place] [address]    Culture MULTIPLE SPECIES PRESENT, SUGGEST RECOLLECTION (A)  Final   Report Status 10/13/2019 FINAL  Final  SARS CORONAVIRUS 2 (TAT 6-24 HRS) Nasopharyngeal Nasopharyngeal Swab     Status: Abnormal   Collection Time: 10/11/19  9:13 PM   Specimen: Nasopharyngeal Swab  Result Value Ref Range Status   SARS Coronavirus 2 POSITIVE (A) NEGATIVE Final    Comment: RESULT CALLED TO, READ BACK BY AND VERIFIED WITH: Tonita PhoenixS. Pickett RN 13:40 10/12/19 ([email or web address]) (NOTE) SARS-CoV-2 target nucleic acids are DETECTED. The SARS-CoV-2 RNA is generally detectable in upper and lower respiratory specimens during the acute phase of infection. Positive results are indicative of active infection with SARS-CoV-2. Clinical  correlation with patient history and other diagnostic information is necessary to determine patient infection status. Positive results do  not rule out bacterial infection or co-infection with other viruses. The expected result is Negative. Fact Sheet for Patients: HairSlick.nohttps://www.fda.gov/media/138098/download Fact Sheet for Healthcare Providers: quierodirigir.comhttps://www.fda.gov/media/138095/download This test is not yet approved or cleared by the [place] FDA and  has been authorized for detection and/or diagnosis of SARS-CoV-2 by FDA under an Emergency Use Authorization (EUA). This EUA will remain  in effect (meaning this test can be used) fo r the duration of the COVID-19 declaration under Section 564(b)(1) of the Act, 21 U.S.C. section 360bbb-3(b)(1), unless the authorization is terminated or revoked sooner. Performed at [facility], [address] [place], [place] [address]       Radiology Studies: Dg Chest Port 1 View  Result  Date: 10/14/2019 CLINICAL DATA:  Shortness of breath EXAM: PORTABLE CHEST 1 VIEW COMPARISON:  10/13/2019 FINDINGS: Cardiomegaly with vascular congestion. Left lower lobe airspace opacity with probable small left effusion. No confluent opacity on the right. No  acute bony abnormality. IMPRESSION: Cardiomegaly, vascular congestion. Left lower lobe atelectasis or infiltrate with probable small left effusion. Improved aeration in the lungs since prior study. Electronically Signed   By: Charlett Nose M.D.   On: 10/14/2019 07:56   Dg Chest Port 1 View  Result Date: 10/13/2019 CLINICAL DATA:  Shortness of breath.  COVID-19 positive. EXAM: PORTABLE CHEST 1 VIEW COMPARISON:  Radiographs yesterday. Chest CT 10/11/2019 FINDINGS: Low lung volumes persist. Slight worsening bilateral airspace disease. Left pleural effusion unchanged. Unchanged heart size and mediastinal contours. No pneumothorax. Cervical and thoracic spinal fusion hardware intact. IMPRESSION: Slight worsening bilateral airspace disease. Unchanged left pleural effusion. Electronically Signed   By: Narda Rutherford M.D.   On: 10/13/2019 05:30    Scheduled Meds:  darifenacin  15 mg Oral Daily   dexamethasone (DECADRON) injection  6 mg Intravenous Q24H   enoxaparin (LOVENOX) injection  30 mg Subcutaneous Q24H   neomycin-bacitracin-polymyxin   Topical Daily   pantoprazole  40 mg Oral Daily   pregabalin  75 mg Oral TID   sodium chloride flush  3 mL Intravenous Q12H   sodium chloride flush  3 mL Intravenous Q12H   tiZANidine  2 mg Oral QPM   Continuous Infusions:  sodium chloride     azithromycin Stopped (10/14/19 0007)   cefTRIAXone (ROCEPHIN)  IV Stopped (10/13/19 2232)   remdesivir 100 mg in NS 250 mL 100 mg (10/14/19 1108)     LOS: 2 days   Time spent: 25 minutes.  Tyrone Nine, MD [facility] [email or web address] Password [email or web address] 10/14/2019, 1:54 PM

## 2019-10-14 NOTE — Consult Note (Signed)
[place] Nurse wound consult note Patient now receiving care in [place]. Patient underwent a live [place] visit by Domenic Moras prior to her transfer to [facility] and COVID+ diagnosis.   Please see her complete and thorough note from 10/12/19 at 0958.  All of her orders for the patient seem to be in place.  Please follow those orders.  Order an air mattress, use Prevalon heel lift boots if the patient will allow.   Monitor the wound area(s) for worsening of condition such as: Signs/symptoms of infection,  Increase in size,  Development of or worsening of odor, Development of pain, or increased pain at the affected locations.  Notify the medical team if any of these develop.  Thank you for the consult. [place] nurse will not follow at this time.  Please re-consult the [place] team if needed.  Val Riles, RN, MSN, CWOCN, CNS-BC, pager [phone]

## 2019-10-14 NOTE — Progress Notes (Signed)
Pt has been keeping her husband updated

## 2019-10-14 NOTE — Progress Notes (Signed)
Remdesivir - Pharmacy Brief Note   O:  ALT: 26 CXR: Cardiomegaly, vascular congestion.  Left lower lobe atelectasis or infiltrate with probable small left effusion.  Improved aeration in the lungs since prior study. SpO2: 98% on 2L Power   A/P:  She recently completed remdesivir course from 9/20 - 9/24.  MD confirms he would like to resume a second course. Remdesivir 100 mg daily x 5 days.   Gretta Arab PharmD, BCPS Clinical pharmacist phone 7am- 5pm: [phone] 10/14/2019 7:22 AM

## 2019-10-15 LAB — COMPREHENSIVE METABOLIC PANEL
ALT: 23 U/L (ref 0–44)
AST: 20 U/L (ref 15–41)
Albumin: 3.4 g/dL — ABNORMAL LOW (ref 3.5–5.0)
Alkaline Phosphatase: 50 U/L (ref 38–126)
Anion gap: 12 (ref 5–15)
BUN: 11 mg/dL (ref 6–20)
CO2: 30 mmol/L (ref 22–32)
Calcium: 8.9 mg/dL (ref 8.9–10.3)
Chloride: 97 mmol/L — ABNORMAL LOW (ref 98–111)
Creatinine, Ser: 0.43 mg/dL — ABNORMAL LOW (ref 0.44–1.00)
GFR calc Af Amer: >60 mL/min (ref >60)
GFR calc non Af Amer: >60 mL/min (ref >60)
Glucose, Bld: 96 mg/dL (ref 70–99)
Potassium: 3.9 mmol/L (ref 3.5–5.1)
Sodium: 139 mmol/L (ref 135–145)
Total Bilirubin: 1.2 mg/dL (ref 0.3–1.2)
Total Protein: 7.5 g/dL (ref 6.5–8.1)

## 2019-10-15 LAB — CBC WITH DIFFERENTIAL/PLATELET
Abs Immature Granulocytes: 0.04 10*3/uL (ref 0.00–0.07)
Basophils Absolute: 0 10*3/uL (ref 0.0–0.1)
Basophils Relative: 0 %
Eosinophils Absolute: 0 10*3/uL (ref 0.0–0.5)
Eosinophils Relative: 0 %
HCT: 34.5 % — ABNORMAL LOW (ref 36.0–46.0)
Hemoglobin: 10.3 g/dL — ABNORMAL LOW (ref 12.0–15.0)
Immature Granulocytes: 1 %
Lymphocytes Relative: 18 %
Lymphs Abs: 1.2 10*3/uL (ref 0.7–4.0)
MCH: 24.3 pg — ABNORMAL LOW (ref 26.0–34.0)
MCHC: 29.9 g/dL — ABNORMAL LOW (ref 30.0–36.0)
MCV: 81.4 fL (ref 80.0–100.0)
Monocytes Absolute: 0.6 10*3/uL (ref 0.1–1.0)
Monocytes Relative: 9 %
Neutro Abs: 4.7 10*3/uL (ref 1.7–7.7)
Neutrophils Relative %: 72 %
Platelets: 330 10*3/uL (ref 150–400)
RBC: 4.24 MIL/uL (ref 3.87–5.11)
RDW: 16.5 % — ABNORMAL HIGH (ref 11.5–15.5)
WBC: 6.5 10*3/uL (ref 4.0–10.5)
nRBC: 0 % (ref 0.0–0.2)

## 2019-10-15 LAB — D-DIMER, QUANTITATIVE: D-Dimer, Quant: 1.48 ug/mL-FEU — ABNORMAL HIGH (ref 0.00–0.50)

## 2019-10-15 LAB — C-REACTIVE PROTEIN: CRP: 5.3 mg/dL — ABNORMAL HIGH (ref <1.0)

## 2019-10-15 NOTE — Progress Notes (Signed)
Patient refused wound care this shift stating she wanted to refuse because she only wants it done once a day. Reminded patient of orders but patient stated she understood but want to refuse.

## 2019-10-15 NOTE — Progress Notes (Signed)
PROGRESS NOTE  [name] OQH:[id number] DOB: Mar 14, 1986 DOA: 10/11/2019 PCP: [facility]   LOS: 3 days   Brief Narrative / Interim history: [name] is a 33 y.o. 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 Since discharge she had been feeling well, had confirmed negative test as an outpatient 10/16 but began noticing progressive shortness of breath and orthopnea and acute on chronic leg swelling. In the ED she was afebrile, tachypneic, with CTA chest showing no large PE but did reveal new left pleural effusion and scattered consolidations suspicious for multifocal pneumonia. SAR-CoV-2 PCR was again positive. BNP was elevated, so she was admitted at [facility] for clinical diagnosis of heart failure, given lasix with brisk diuresis. Echocardiogram showed no evidence of systolic or diastolic dysfunction and a normal IVC. Cardiology consultant did not suspect heart failure to be cause of symptoms. Speech therapy evaluation ordered and demonstrated no aspiration risk. Despite diuresis, she grew more hypoxic and CXR confirmed worsening bilateral airspace disease. CRP noted to be elevated at 10.4 (had normalized by time of previous discharge), PCT undetectable, and  Hospitalist conferred with [facility] MD who recommended initiating steroids and transferring to [facility]  Subjective / 24h Interval events: Feeling improved, less short of breath.  Feels stronger.  Denies any chest pain, no nausea or vomiting, no abdominal pain.  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  False negative Covid 10/16 versus true re-infection -CT angiogram on admission without PE -Echocardiogram does not implicate cardiogenic cause to hypoxia. Elevated inflammatory markers and exposure to mother-in-law who is not regularly following masking/social distancing guidelines makes recrudescence or reinfection with covid possible. Organizing pneumonia also a possibility.  -Started on remdesivir, last dose on 10/27.  Started on Decadron  COVID-19 Labs  Recent Labs    10/12/19 1438 10/13/19 0417 10/15/19 0119  DDIMER 3.10* 2.73* 1.48*  FERRITIN 7* 8*  --   LDH 131 192  --   CRP 10.4* 10.6* 5.3*    Lab Results  Component Value Date   SARSCOV2NAA POSITIVE (A) 10/11/2019   SARSCOV2NAA Not Detected 10/07/2019   SARSCOV2NAA POSITIVE (A) 09/07/2019   SARSCOV2NAA NEGATIVE 08/30/2019     Active Problems Neurogenic bladder/chronic suprapubic catheter -Continue routine catheter care  Spastic incomplete quadriplegia status post spinal cord injury at age 32 -Some use of hands, contractures noted -Continue home medications  Chronic pain status post spinal surgeries -Continue home regimen  Anxiety -Continue Valium  Acute blood loss anemia, vaginal bleeding -Hemoglobin is now been stable  Pressure injuries -Wound care following  Pressure Injury 01/19/18 Stage II -  Partial thickness loss of dermis presenting as a shallow open ulcer with a red, pink wound bed without slough. (Active)  01/19/18 0500  Location: Hip  Location Orientation: Anterior;Left;Proximal;Upper  Staging: Stage II -  Partial thickness loss of dermis presenting as a shallow open ulcer with a red, pink wound bed without  slough.  Wound Description (Comments):    Present on Admission: Yes     Pressure Injury 07/09/18 Stage III -  Full thickness tissue loss. Subcutaneous fat may be visible but bone, tendon or muscle are NOT exposed. labial folds, perineum (Active)  07/09/18 2030  Location: Other (Comment) (under left buttock fold )  Location Orientation: Left  Staging: Stage III -  Full thickness tissue loss. Subcutaneous fat may be visible but bone, tendon or muscle are NOT exposed.  Wound Description (Comments): labial folds, perineum  Present on Admission: Yes     Pressure Injury 07/09/18 Stage III -  Full thickness tissue loss. Subcutaneous fat may be visible but bone, tendon or muscle are NOT exposed. per patient area of extra skin  (Active)  07/09/18 2030  Location: Hip  Location Orientation: Left  Staging: Stage III -  Full thickness tissue loss. Subcutaneous fat may be visible but bone, tendon or muscle are NOT exposed.  Wound Description (Comments): per patient area of extra skin   Present on Admission: Yes     Pressure Injury 07/09/18 per patient pressure wound from where laptop rests in her lap  (Active)  07/09/18 2030  Location: Thigh  Location Orientation: Right  Staging:   Wound Description (Comments): per patient pressure wound from where laptop rests in her lap   Present on Admission: Yes     Pressure Injury 07/09/18 Deep Tissue Injury - Purple or maroon localized area of discolored intact skin or blood-filled blister due to damage of underlying soft tissue from pressure and/or shear. (Active)  07/09/18 1734  Location: Heel  Location Orientation: Left  Staging: Deep Tissue Injury - Purple or maroon localized area of discolored intact skin or blood-filled blister due to damage of underlying soft tissue from pressure and/or shear.  Wound Description (Comments):   Present on Admission: Yes     Pressure Injury 07/12/18 Deep Tissue Injury - Purple or maroon localized area of discolored intact skin or blood-filled blister due to  damage of underlying soft tissue from pressure and/or shear. (Active)  07/12/18 1737  Location: Heel  Location Orientation: Right  Staging: Deep Tissue Injury - Purple or maroon localized area of discolored intact skin or blood-filled blister due to damage of underlying soft tissue from pressure and/or shear.  Wound Description (Comments):   Present on Admission: Yes     Pressure Injury 09/08/19 Ischial tuberosity Left Stage III -  Full thickness tissue loss. Subcutaneous fat may be visible but bone, tendon or muscle are NOT exposed. red, draining (Active)  09/08/19 1913  Location: Ischial tuberosity  Location Orientation: Left  Staging: Stage III -  Full thickness tissue loss. Subcutaneous fat may be visible but bone, tendon or muscle are NOT exposed.  Wound Description (Comments): red, draining  Present on Admission: Yes     Pressure Injury Ischial tuberosity Right Stage III -  Full thickness tissue loss. Subcutaneous fat may be visible but bone, tendon or muscle are NOT exposed. red, draining (Active)     Location: Ischial tuberosity  Location Orientation: Right  Staging: Stage III -  Full thickness tissue loss. Subcutaneous fat may be visible but bone, tendon or muscle are NOT exposed.  Wound Description (Comments): red, draining  Present on Admission: Yes     Pressure Injury 09/08/19 Sacrum Stage I -  Intact skin with non-blanchable redness of a localized area usually over a bony prominence. pink, unblanchable (Active)  09/08/19 1914  Location: Sacrum  Location Orientation:  Staging: Stage I -  Intact skin with non-blanchable redness of a localized area usually over a bony prominence.  Wound Description (Comments): pink, unblanchable  Present on Admission: Yes    Scheduled Meds: . darifenacin  15 mg Oral Daily  . dexamethasone (DECADRON) injection  6 mg Intravenous Q24H  . enoxaparin (LOVENOX) injection  30 mg Subcutaneous Q24H  . neomycin-bacitracin-polymyxin   Topical  Daily  . pantoprazole  40 mg Oral Daily  . pregabalin  75 mg Oral TID  . sodium chloride flush  3 mL Intravenous Q12H  . tiZANidine  2 mg Oral QPM   Continuous Infusions: . sodium chloride    . remdesivir 100 mg in NS 250 mL 100 mg (10/15/19 0917)   PRN Meds:.sodium chloride, acetaminophen **OR** acetaminophen, albuterol, diazepam, guaiFENesin-dextromethorphan, HYDROcodone-acetaminophen, HYDROmorphone, ondansetron **OR** ondansetron (ZOFRAN) IV, polyethylene glycol, sodium chloride flush  DVT prophylaxis: Lovenox Code Status: Full code Family Communication: d/w patient  Disposition Plan: home when ready   Consultants:  None  Procedures:   2D echo: Echocardiogram 10/13/2019: 1. Left ventricular ejection fraction, by visual estimation, is 60 to 65%. The left ventricle has normal function. Normal left ventricular size. There is no left ventricular hypertrophy. 2. Global right ventricle has normal systolic function.The right ventricular size is normal. No increase in right ventricular wall thickness. 3. Left atrial size was normal. 4. Right atrial size was normal. 5. The mitral valve is normal in structure. No evidence of mitral valve regurgitation. No evidence of mitral stenosis. 6. The tricuspid valve is normal in structure. Tricuspid valve regurgitation was not visualized by color flow Doppler. 7. The aortic valve is tricuspid Aortic valve regurgitation was not visualized by color flow Doppler. Structurally normal aortic valve, with no evidence of sclerosis or stenosis. 8. The pulmonic valve was normal in structure. Pulmonic valve regurgitation is not visualized by color flow Doppler. 9. The inferior vena cava is normal in size with greater than 50% respiratory variability, suggesting right atrial pressure of 3 mmHg.  Microbiology  None   Antimicrobials:  Ceftriaxone, azithromycin 10/20 - 10/22  Vancomycin, meropenem 10/20   Remdesivir 9/20 - 9/24; restarted 10/23 -  10/27.   Objective: Vitals:   10/14/19 1800 10/14/19 2038 10/15/19 0513 10/15/19 0825  BP:  121/77 (!) 149/87 (!) 144/89  Pulse:    65  Resp:  18 18 20   Temp:  99.4 F (37.4 C) 98.5 F (36.9 C) 98.7 F (37.1 C)  TempSrc:  Oral Oral Oral  SpO2: 93% 97%  91%  Weight:   42 kg   Height:        Intake/Output Summary (Last 24 hours) at 10/15/2019 1058 Last data filed at 10/15/2019 0851 Gross per 24 hour  Intake 610 ml  Output 1400 ml  Net -790 ml   Filed Weights   10/12/19 0135 10/15/19 0513  Weight: 41.7 kg 42 kg    Examination:  Constitutional: NAD Eyes: lids and conjunctivae normal ENMT: Mucous membranes are moist. Respiratory: clear to auscultation bilaterally, no wheezing, no crackles. Normal respiratory effort. Cardiovascular: Regular rate and rhythm, no murmurs / rubs / gallops. No LE edema.  Abdomen: no tenderness. Bowel sounds positive.  Musculoskeletal: no clubbing / cyanosis. Skin: no rashes Neurologic: Incomplete paraplegia, no focal deficits  Data Reviewed: I have independently reviewed following labs and imaging studies   CBC: Recent Labs  Lab 10/11/19 2200 10/12/19 0431 10/13/19 0417 10/15/19 0119  WBC 9.3 6.9 5.3 6.5  NEUTROABS 6.3 4.9 3.4 4.7  HGB 8.6* 9.2* 9.7* 10.3*  HCT 29.7* 32.4* 33.7* 34.5*  MCV 84.1 86.9 85.5 81.4  PLT 239 235 206 330   Basic Metabolic Panel: Recent Labs  Lab 10/11/19 2051 10/12/19 0431 10/13/19 0417 10/15/19 0119  NA 138 136 136 139  K 4.9 4.4 4.2 3.9  CL 100 101 94* 97*  CO2 GLUCOSE 97 84 86 96  BUN CREATININE 0.46 0.37* 0.40* 0.43*  CALCIUM 8.3* 8.1* 8.1* 8.9  MG  --   --  2.1  --   PHOS  --   --  3.5  --    GFR: Estimated Creatinine Clearance: 57.3 mL/min (A) (by C-G formula based on SCr of 0.43 mg/dL (L)). Liver Function Tests: Recent Labs  Lab 10/11/19 2051 10/13/19 0417 10/15/19 0119  AST ALT ALKPHOS 62 59 50  BILITOT 0.2* 0.6 1.2  PROT 7.2 6.6  7.5  ALBUMIN 3.0* 2.8* 3.4*   No results for input(s): LIPASE, AMYLASE in the last 168 hours. No results for input(s): AMMONIA in the last 168 hours. Coagulation Profile: Recent Labs  Lab 10/11/19 2200  INR 1.0   Cardiac Enzymes: No results for input(s): CKTOTAL, CKMB, CKMBINDEX, TROPONINI in the last 168 hours. BNP (last 3 results) No results for input(s): PROBNP in the last 8760 hours. HbA1C: No results for input(s): HGBA1C in the last 72 hours. CBG: No results for input(s): GLUCAP in the last 168 hours. Lipid Profile: No results for input(s): CHOL, HDL, LDLCALC, TRIG, CHOLHDL, LDLDIRECT in the last 72 hours. Thyroid Function Tests: No results for input(s): TSH, T4TOTAL, FREET4, T3FREE, THYROIDAB in the last 72 hours. Anemia Panel: Recent Labs    10/12/19 1438 10/13/19 0417  VITAMINB12  --  186  FOLATE  --  11.9  FERRITIN 7* 8*  TIBC  --  331  IRON  --  15*  RETICCTPCT  --  3.2*   Urine analysis:    Component Value Date/Time   COLORURINE AMBER (A) 10/11/2019 2100   APPEARANCEUR CLOUDY (A) 10/11/2019 2100   LABSPEC 1.026 10/11/2019 2100   PHURINE 5.0 10/11/2019 2100   GLUCOSEU NEGATIVE 10/11/2019 2100   HGBUR NEGATIVE 10/11/2019 2100   BILIRUBINUR NEGATIVE 10/11/2019 2100   KETONESUR NEGATIVE 10/11/2019 2100   PROTEINUR 100 (A) 10/11/2019 2100   UROBILINOGEN 0.2 08/03/2015 1714   NITRITE POSITIVE (A) 10/11/2019 2100   LEUKOCYTESUR LARGE (A) 10/11/2019 2100   Sepsis Labs: Invalid input(s): PROCALCITONIN, LACTICIDVEN  Recent Results (from the past 240 hour(s))  Novel Coronavirus, NAA ([facility])     Status: None   Collection Time: 10/07/19 12:53 PM   Specimen: Nasopharyngeal(NP) swabs in vial transport medium   NASOPHARYNGE  TESTING  Result Value Ref Range Status   SARS-CoV-2, NAA Not Detected Not Detected Final    Comment: This nucleic acid amplification test was developed and its performance characteristics determined by [employer]. Nucleic acid  amplification tests include PCR and TMA. This test has not been FDA cleared or approved. This test has been authorized by FDA under an Emergency Use Authorization (EUA). This test is only authorized for the duration of time the declaration that circumstances exist justifying the authorization of the emergency use of in vitro diagnostic tests for detection of SARS-CoV-2 virus and/or diagnosis of COVID-19 infection under section 564(b)(1) of the Act, 21 U.S.C. 213YQM-5(H) (1), unless the authorization is terminated or revoked sooner. When diagnostic testing is  negative, the possibility of a false negative result should be considered in the context of a patient's recent exposures and the presence of clinical signs and symptoms consistent with COVID-19. An individual without symptoms of COVID-19 and who is not shedding SARS-CoV-2 virus would  expect to have a negative (not detected) result in this assay.   Blood Culture (routine x 2)     Status: None (Preliminary result)   Collection Time: 10/11/19  8:06 PM   Specimen: BLOOD RIGHT HAND  Result Value Ref Range Status   Specimen Description   Final    BLOOD RIGHT HAND Performed at [facility], [address], [place], [place] [address]    Special Requests   Final    BOTTLES DRAWN AEROBIC ONLY Blood Culture results may not be optimal due to an inadequate volume of blood received in culture bottles Performed at [facility], [address] [place], [place] [address]    Culture   Final    NO GROWTH 3 DAYS Performed at [facility], [address] [place], [place] [address]    Report Status PENDING  Incomplete  Blood Culture (routine x 2)     Status: None (Preliminary result)   Collection Time: 10/11/19  8:51 PM   Specimen: BLOOD  Result Value Ref Range Status   Specimen Description   Final    BLOOD BLOOD LEFT FOREARM Performed at [facility], [address],  [place], [place] [address]    Special Requests   Final    BOTTLES DRAWN AEROBIC ONLY Blood Culture adequate volume Performed at [facility], [address] [place], [place] [address]    Culture   Final    NO GROWTH 4 DAYS Performed at [facility], [address], [place], [place] [address]    Report Status PENDING  Incomplete  Urine culture     Status: Abnormal   Collection Time: 10/11/19  9:00 PM   Specimen: In/Out Cath Urine  Result Value Ref Range Status   Specimen Description   Final    IN/OUT CATH URINE Performed at [facility], [address] [place], [place] [address]    Special Requests   Final    NONE Performed at [facility], [address] [place], [place] [address]    Culture MULTIPLE SPECIES PRESENT, SUGGEST RECOLLECTION (A)  Final   Report Status 10/13/2019 FINAL  Final  SARS CORONAVIRUS 2 (TAT 6-24 HRS) Nasopharyngeal Nasopharyngeal Swab     Status: Abnormal   Collection Time: 10/11/19  9:13 PM   Specimen: Nasopharyngeal Swab  Result Value Ref Range Status   SARS Coronavirus 2 POSITIVE (A) NEGATIVE Final    Comment: RESULT CALLED TO, READ BACK BY AND VERIFIED WITH: Tonita Phoenix RN 13:40 10/12/19 ([email or web address]) (NOTE) SARS-CoV-2 target nucleic acids are DETECTED. The SARS-CoV-2 RNA is generally detectable in upper and lower respiratory specimens during the acute phase of infection. Positive results are indicative of active infection with SARS-CoV-2. Clinical  correlation with patient history and other diagnostic information is necessary to determine patient infection status. Positive results do  not rule out bacterial infection or co-infection with other viruses. The expected result is Negative. Fact Sheet for Patients: HairSlick.no Fact Sheet for Healthcare Providers: quierodirigir.com This test is not yet approved or cleared by the [place]  FDA and  has been authorized for detection and/or diagnosis of SARS-CoV-2 by FDA under an Emergency Use  Authorization (EUA). This EUA will remain  in effect (meaning this test can be used) fo r the duration of the COVID-19 declaration under Section 564(b)(1) of the Act, 21 U.S.C. section 360bbb-3(b)(1), unless the authorization is terminated or revoked sooner. Performed at [facility], [address] [place], [place] [id number]       Radiology Studies: Dg Chest Port 1 View  Result Date: 10/14/2019 CLINICAL DATA:  Shortness of breath EXAM: PORTABLE CHEST 1 VIEW COMPARISON:  10/13/2019 FINDINGS: Cardiomegaly with vascular congestion. Left lower lobe airspace opacity with probable small left effusion. No confluent opacity on the right. No acute bony abnormality. IMPRESSION: Cardiomegaly, vascular congestion. Left lower lobe atelectasis or infiltrate with probable small left effusion. Improved aeration in the lungs since prior study. Electronically Signed   By: Charlett NoseKevin  Dover M.D.   On: 10/14/2019 07:56     Pamella Pertostin Azyria Osmon, MD, PhD [facility]  Contact via  [email or web address]  [facility] Office Info P: [phone] F: [phone]

## 2019-10-15 NOTE — Progress Notes (Signed)
Suprapubic catheter exchange, removed 16 french foley, replaced with 16 french foley without difficulty, immediate urine return.  Patient tolerated well.

## 2019-10-16 DIAGNOSIS — J1289 Other viral pneumonia: Secondary | ICD-10-CM

## 2019-10-16 DIAGNOSIS — U071 COVID-19: Principal | ICD-10-CM

## 2019-10-16 LAB — COMPREHENSIVE METABOLIC PANEL WITH GFR
ALT: 29 U/L (ref 0–44)
AST: 25 U/L (ref 15–41)
Albumin: 3.6 g/dL (ref 3.5–5.0)
Alkaline Phosphatase: 52 U/L (ref 38–126)
Anion gap: 11 (ref 5–15)
BUN: 15 mg/dL (ref 6–20)
CO2: 28 mmol/L (ref 22–32)
Calcium: 9 mg/dL (ref 8.9–10.3)
Chloride: 99 mmol/L (ref 98–111)
Creatinine, Ser: 0.4 mg/dL — ABNORMAL LOW (ref 0.44–1.00)
GFR calc Af Amer: >60 mL/min
GFR calc non Af Amer: >60 mL/min
Glucose, Bld: 175 mg/dL — ABNORMAL HIGH (ref 70–99)
Potassium: 3.7 mmol/L (ref 3.5–5.1)
Sodium: 138 mmol/L (ref 135–145)
Total Bilirubin: 0.8 mg/dL (ref 0.3–1.2)
Total Protein: 7.8 g/dL (ref 6.5–8.1)

## 2019-10-16 LAB — CBC WITH DIFFERENTIAL/PLATELET
Abs Immature Granulocytes: 0.05 10*3/uL (ref 0.00–0.07)
Basophils Absolute: 0 10*3/uL (ref 0.0–0.1)
Basophils Relative: 0 %
Eosinophils Absolute: 0 10*3/uL (ref 0.0–0.5)
Eosinophils Relative: 0 %
HCT: 37.4 % (ref 36.0–46.0)
Hemoglobin: 11.3 g/dL — ABNORMAL LOW (ref 12.0–15.0)
Immature Granulocytes: 1 %
Lymphocytes Relative: 14 %
Lymphs Abs: 0.9 10*3/uL (ref 0.7–4.0)
MCH: 24.2 pg — ABNORMAL LOW (ref 26.0–34.0)
MCHC: 30.2 g/dL (ref 30.0–36.0)
MCV: 80.1 fL (ref 80.0–100.0)
Monocytes Absolute: 0.5 10*3/uL (ref 0.1–1.0)
Monocytes Relative: 8 %
Neutro Abs: 4.8 10*3/uL (ref 1.7–7.7)
Neutrophils Relative %: 77 %
Platelets: 349 10*3/uL (ref 150–400)
RBC: 4.67 MIL/uL (ref 3.87–5.11)
RDW: 16.8 % — ABNORMAL HIGH (ref 11.5–15.5)
WBC: 6.2 10*3/uL (ref 4.0–10.5)
nRBC: 0 % (ref 0.0–0.2)

## 2019-10-16 LAB — CULTURE, BLOOD (ROUTINE X 2)
Culture: NO GROWTH
Special Requests: ADEQUATE

## 2019-10-16 LAB — C-REACTIVE PROTEIN: CRP: 2.7 mg/dL — ABNORMAL HIGH

## 2019-10-16 LAB — D-DIMER, QUANTITATIVE: D-Dimer, Quant: 1.59 ug{FEU}/mL — ABNORMAL HIGH (ref 0.00–0.50)

## 2019-10-16 NOTE — Progress Notes (Signed)
Patient is refusing wound care again tonight. Patient encouraged to have wound care performed as ordered but continues to decline. Patient advised if soiled during the shift that then the dressings will need to be changed. Patient stated understanding.

## 2019-10-16 NOTE — Progress Notes (Signed)
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  In the emergency room, patient found to be afebrile, but tachypneic with no evidence of PE, but noted new left pleural effusion and scattered consolidation suspicious for pneumonia.  Covid test was positive.  Patient had had a negative Covid test as an outpatient on 10/16.  Echocardiogram was normal.  Cardiology consult did not feel patient's heart failure explains her symptoms.  Aspiration ruled out.  Despite diuresis, patient became more hypoxic chest x-ray noted worsening bilateral airspace disease with CRP elevated at 10.4.  Patient transferred to [facility] campus and started on steroids and Remdisivir.  Since hospitalization, patient has been slowly improving and is currently off of oxygen altogether.  She is breathing much more comfortably without any discomfort.  Assessment/Plan: Principal Problem:   Acute respiratory failure with hypoxia (HCC) secondary to COVID-19 infection/left pleural effusion: Unclear if patient had false negative Covid test on 10/16 versus true reinfection.  Either way she seems to be improving clinically.  Continue steroids.  Finishes Remdisivir on 10/27.  Echocardiogram does not note cardiac cause.  PE ruled out. Active Problems: Acute blood loss anemia secondary to vaginal bleeding: Hemoglobin now stable.  Anxiety: Continue as needed Valium.  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  Secondary neurogenic bladder and chronic  suprapubic catheter: Catheter care accordingly    Pressure injury of contiguous region involving back, buttock, and hip, multiple stages: Present on admission.  At times, patient does not allow nursing to treat decubitus wounds.  As listed below. Pressure Injury 01/19/18 Stage II -  Partial thickness loss of dermis presenting as a shallow open ulcer with a red, pink wound bed without slough. (Active)  01/19/18 0500  Location: Hip  Location Orientation: Anterior;Left;Proximal;Upper  Staging: Stage II -  Partial thickness loss of dermis presenting as a shallow open ulcer with a red, pink wound bed without slough.  Wound Description (Comments):   Present on Admission: Yes     Pressure Injury 07/09/18 Stage III -  Full thickness tissue loss. Subcutaneous fat may be visible but bone, tendon or muscle are NOT exposed. labial folds, perineum (Active)  07/09/18 2030  Location: Other (Comment) (under left buttock fold )  Location Orientation: Left  Staging: Stage III -  Full thickness tissue loss. Subcutaneous fat may be visible but bone, tendon or muscle are NOT exposed.  Wound Description (Comments): labial folds, perineum  Present on Admission: Yes     Pressure Injury 07/09/18 Stage III -  Full thickness tissue loss. Subcutaneous fat may be visible but bone, tendon or muscle are NOT exposed. per patient area of extra skin  (Active)  07/09/18 2030  Location: Hip  Location Orientation: Left  Staging: Stage III -  Full thickness tissue loss. Subcutaneous fat may be visible but bone, tendon or muscle are NOT exposed.  Wound Description (Comments): per patient area of extra skin   Present on Admission: Yes     Pressure Injury 07/09/18 per patient pressure wound from where laptop rests in her lap  (Active)  07/09/18 2030  Location: Thigh  Location Orientation: Right  Staging:   Wound Description (Comments): per patient pressure wound from where laptop rests in her lap   Present on Admission: Yes      Pressure Injury 07/09/18 Deep Tissue Injury - Purple or maroon localized area of discolored intact skin or blood-filled blister due to damage of underlying soft tissue from pressure and/or shear. (Active)  07/09/18 1734  Location: Heel  Location Orientation: Left  Staging: Deep Tissue Injury - Purple or maroon localized area of discolored intact skin or blood-filled blister due to damage of underlying soft tissue from pressure and/or shear.  Wound Description (Comments):   Present on Admission: Yes     Pressure Injury 07/12/18 Deep Tissue Injury - Purple or maroon localized area of discolored intact skin or blood-filled blister due to damage of underlying soft tissue from pressure and/or shear. (Active)  07/12/18 1737  Location: Heel  Location Orientation: Right  Staging: Deep Tissue Injury - Purple or maroon localized area of discolored intact skin or blood-filled blister due to damage of underlying soft tissue from pressure and/or shear.  Wound Description (Comments):   Present on Admission: Yes     Pressure Injury 09/08/19 Ischial tuberosity Left Stage III -  Full thickness tissue loss. Subcutaneous fat may be visible but bone, tendon or muscle are NOT exposed. red, draining (Active)  09/08/19 1913  Location: Ischial tuberosity  Location Orientation: Left  Staging: Stage III -  Full thickness tissue loss. Subcutaneous fat may be visible but bone, tendon or muscle are NOT exposed.  Wound Description (Comments): red, draining  Present on Admission: Yes     Pressure Injury Ischial tuberosity Right Stage III -  Full thickness tissue loss. Subcutaneous fat may be visible but bone, tendon or muscle are NOT exposed. red, draining (Active)     Location: Ischial tuberosity  Location Orientation: Right  Staging: Stage III -  Full thickness tissue loss. Subcutaneous fat may be visible but bone, tendon or muscle are NOT exposed.  Wound Description (Comments): red, draining  Present on  Admission: Yes     Pressure Injury 09/08/19 Sacrum Stage I -  Intact skin with non-blanchable redness of a localized area usually over a bony prominence. pink, unblanchable (Active)  09/08/19 1914  Location: Sacrum  Location Orientation:   Staging: Stage I -  Intact skin with non-blanchable redness of a localized area usually over a bony prominence.  Wound Description (Comments): pink, unblanchable  Present on Admission: Yes     Code Status: Full code  Family Communication: Spoke with husband by phone   Disposition Plan: Anticipate should be able to be d/c Tuesday after finishing Remdisivir    Consultants:  Cardiology   Procedures:  Echocardiogram done 10/22: Preserved ejection fraction, no evidence of diastolic dysfunction, no valvular dysfunction  Antimicrobials:  Ceftriaxone, azithromycin 10/20 - 10/22  Vancomycin, meropenem 10/20  Remdesivir 9/20 - 9/24; restarted 10/23 - 10/27.  DVT prophylaxis: Lovenox   Objective: Vitals:   10/16/19 0753 10/16/19 1146  BP: (!) 149/97 (!) 123/92  Pulse: 98 81  Resp: 16 16  Temp: 98 F (36.7 C) 98.6 F (37 C)  SpO2: 92% 94%    Intake/Output Summary (Last 24 hours) at 10/16/2019 1408 Last data filed at 10/16/2019 0800 Gross per 24 hour  Intake 360 ml  Output 800 ml  Net -440 ml   Filed Weights   10/12/19 0135 10/15/19 0513 10/16/19 0400  Weight: 41.7 kg 42 kg 49.4 kg  Body mass index is 24.42 kg/m.  Exam:   General: Alert and oriented x3, no acute distress  Cardiovascular: Regular rate and rhythm, S1-S2  Respiratory: Clear auscultation bilaterally  Abdomen: Soft, nontender, nondistended, positive bowel sounds  Musculoskeletal: No clubbing or cyanosis or edema  Skin: Multiple decubitus ulcerations as noted above  Psychiatry: Appropriate, no evidence of psychoses   Data Reviewed: CBC: Recent Labs  Lab 10/11/19 2200 10/12/19 0431 10/13/19 0417 10/15/19 0119 10/16/19 0115  WBC 9.3 6.9 5.3  6.5 6.2  NEUTROABS 6.3 4.9 3.4 4.7 4.8  HGB 8.6* 9.2* 9.7* 10.3* 11.3*  HCT 29.7* 32.4* 33.7* 34.5* 37.4  MCV 84.1 86.9 85.5 81.4 80.1  PLT 239 235 206 330 349   Basic Metabolic Panel: Recent Labs  Lab 10/11/19 2051 10/12/19 0431 10/13/19 0417 10/15/19 0119 10/16/19 0115  NA 138 136 136 139 138  K 4.9 4.4 4.2 3.9 3.7  CL 100 101 94* 97* 99  CO2 GLUCOSE 97 84 86 96 175*  BUN CREATININE 0.46 0.37* 0.40* 0.43* 0.40*  CALCIUM 8.3* 8.1* 8.1* 8.9 9.0  MG  --   --  2.1  --   --   PHOS  --   --  3.5  --   --    GFR: Estimated Creatinine Clearance: 65.5 mL/min (A) (by C-G formula based on SCr of 0.4 mg/dL (L)). Liver Function Tests: Recent Labs  Lab 10/11/19 2051 10/13/19 0417 10/15/19 0119 10/16/19 0115  AST ALT ALKPHOS 62 59 50 52  BILITOT 0.2* 0.6 1.2 0.8  PROT 7.2 6.6 7.5 7.8  ALBUMIN 3.0* 2.8* 3.4* 3.6   No results for input(s): LIPASE, AMYLASE in the last 168 hours. No results for input(s): AMMONIA in the last 168 hours. Coagulation Profile: Recent Labs  Lab 10/11/19 2200  INR 1.0   Cardiac Enzymes: No results for input(s): CKTOTAL, CKMB, CKMBINDEX, TROPONINI in the last 168 hours. BNP (last 3 results) No results for input(s): PROBNP in the last 8760 hours. HbA1C: No results for input(s): HGBA1C in the last 72 hours. CBG: No results for input(s): GLUCAP in the last 168 hours. Lipid Profile: No results for input(s): CHOL, HDL, LDLCALC, TRIG, CHOLHDL, LDLDIRECT in the last 72 hours. Thyroid Function Tests: No results for input(s): TSH, T4TOTAL, FREET4, T3FREE, THYROIDAB in the last 72 hours. Anemia Panel: No results for input(s): VITAMINB12, FOLATE, FERRITIN, TIBC, IRON, RETICCTPCT in the last 72 hours. Urine analysis:    Component Value Date/Time   COLORURINE AMBER (A) 10/11/2019 2100   APPEARANCEUR CLOUDY (A) 10/11/2019 2100   LABSPEC 1.026 10/11/2019 2100   PHURINE 5.0 10/11/2019 2100    GLUCOSEU NEGATIVE 10/11/2019 2100   HGBUR NEGATIVE 10/11/2019 2100   BILIRUBINUR NEGATIVE 10/11/2019 2100   KETONESUR NEGATIVE 10/11/2019 2100   PROTEINUR 100 (A) 10/11/2019 2100   UROBILINOGEN 0.2 08/03/2015 1714   NITRITE POSITIVE (A) 10/11/2019 2100   LEUKOCYTESUR LARGE (A) 10/11/2019 2100   Sepsis Labs: (procalcitonin:4,lacticidven:4)  ) Recent Results (from the past 240 hour(s))  Novel Coronavirus, NAA ([facility])     Status: None   Collection Time: 10/07/19 12:53 PM   Specimen: Nasopharyngeal(NP) swabs in vial transport medium   NASOPHARYNGE  TESTING  Result Value Ref Range Status   SARS-CoV-2, NAA Not Detected Not Detected Final    Comment: This nucleic acid amplification test was developed and its performance characteristics determined by  [facility]. Nucleic acid amplification tests include PCR and TMA. This test has not been FDA cleared or approved. This test has been authorized by FDA under an Emergency Use Authorization (EUA). This test is only authorized for the duration of time the declaration that circumstances exist justifying the authorization of the emergency use of in vitro diagnostic tests for detection of SARS-CoV-2 virus and/or diagnosis of COVID-19 infection under section 564(b)(1) of the Act, 21 U.S.C. 295AOZ-3(Y360bbb-3(b) (1), unless the authorization is terminated or revoked sooner. When diagnostic testing is negative, the possibility of a false negative result should be considered in the context of a patient's recent exposures and the presence of clinical signs and symptoms consistent with COVID-19. An individual without symptoms of COVID-19 and who is not shedding SARS-CoV-2 virus would  expect to have a negative (not detected) result in this assay.   Blood Culture (routine x 2)     Status: None (Preliminary result)   Collection Time: 10/11/19  8:06 PM   Specimen: BLOOD RIGHT HAND  Result Value Ref Range Status   Specimen Description    Final    BLOOD RIGHT HAND Performed at [facility], [address] [place], [place] [address]    Special Requests   Final    BOTTLES DRAWN AEROBIC ONLY Blood Culture results may not be optimal due to an inadequate volume of blood received in culture bottles Performed at [facility], [address] [place], [place] [address]    Culture   Final    NO GROWTH 3 DAYS Performed at [facility], [address] [place], [place] [address]    Report Status PENDING  Incomplete  Blood Culture (routine x 2)     Status: None (Preliminary result)   Collection Time: 10/11/19  8:51 PM   Specimen: BLOOD  Result Value Ref Range Status   Specimen Description   Final    BLOOD BLOOD LEFT FOREARM Performed at [facility], [address] [place], [place] [address]    Special Requests   Final    BOTTLES DRAWN AEROBIC ONLY Blood Culture adequate volume Performed at [facility], [address] [place], [place] [address]    Culture   Final    NO GROWTH 4 DAYS Performed at [facility], [address] [place], [place] [address]    Report Status PENDING  Incomplete  Urine culture     Status: Abnormal   Collection Time: 10/11/19  9:00 PM   Specimen: In/Out Cath Urine  Result Value Ref Range Status   Specimen Description   Final    IN/OUT CATH URINE Performed at [facility], [address] [place], [place] [address]    Special Requests   Final    NONE Performed at [facility], [address] [place], [place] [address]    Culture MULTIPLE SPECIES PRESENT, SUGGEST RECOLLECTION (A)  Final   Report Status 10/13/2019 FINAL  Final  SARS CORONAVIRUS 2 (TAT 6-24 HRS) Nasopharyngeal Nasopharyngeal Swab     Status: Abnormal   Collection Time: 10/11/19  9:13 PM   Specimen: Nasopharyngeal Swab  Result Value Ref Range Status   SARS Coronavirus 2 POSITIVE (A) NEGATIVE Final     Comment: RESULT CALLED TO, READ BACK BY AND VERIFIED WITH: Tonita PhoenixS. Pickett RN 13:40 10/12/19 ([email or web address]) (NOTE) SARS-CoV-2 target nucleic acids are DETECTED. The SARS-CoV-2 RNA is generally detectable in upper and lower respiratory specimens during  the acute phase of infection. Positive results are indicative of active infection with SARS-CoV-2. Clinical  correlation with patient history and other diagnostic information is necessary to determine patient infection status. Positive results do  not rule out bacterial infection or co-infection with other viruses. The expected result is Negative. Fact Sheet for Patients: SugarRoll.be Fact Sheet for Healthcare Providers: https://www.woods-mathews.com/ This test is not yet approved or cleared by the [place] FDA and  has been authorized for detection and/or diagnosis of SARS-CoV-2 by FDA under an Emergency Use Authorization (EUA). This EUA will remain  in effect (meaning this test can be used) fo r the duration of the COVID-19 declaration under Section 564(b)(1) of the Act, 21 U.S.C. section 360bbb-3(b)(1), unless the authorization is terminated or revoked sooner. Performed at [facility], [place] [address] [place], [place] [address]       Studies: No results found.  Scheduled Meds: . darifenacin  15 mg Oral Daily  . dexamethasone (DECADRON) injection  6 mg Intravenous Q24H  . enoxaparin (LOVENOX) injection  30 mg Subcutaneous Q24H  . neomycin-bacitracin-polymyxin   Topical Daily  . pantoprazole  40 mg Oral Daily  . pregabalin  75 mg Oral TID  . sodium chloride flush  3 mL Intravenous Q12H  . tiZANidine  2 mg Oral QPM    Continuous Infusions: . sodium chloride    . remdesivir 100 mg in NS 250 mL 100 mg (10/16/19 1041)     LOS: 4 days     Annita Brod, MD [facility]  To reach me or the doctor on call, go to: [email or web address] Password [email or web address]  10/16/2019, 2:08 PM

## 2019-10-17 DIAGNOSIS — R030 Elevated blood-pressure reading, without diagnosis of hypertension: Secondary | ICD-10-CM

## 2019-10-17 LAB — URINALYSIS, ROUTINE W REFLEX MICROSCOPIC
Bilirubin Urine: NEGATIVE
Glucose, UA: NEGATIVE mg/dL
Ketones, ur: 20 mg/dL — AB
Leukocytes,Ua: NEGATIVE
Nitrite: NEGATIVE
Protein, ur: NEGATIVE mg/dL
Specific Gravity, Urine: 1.014 (ref 1.005–1.030)
pH: 8 (ref 5.0–8.0)

## 2019-10-17 LAB — CBC WITH DIFFERENTIAL/PLATELET
Abs Immature Granulocytes: 0.03 10*3/uL (ref 0.00–0.07)
Basophils Absolute: 0 10*3/uL (ref 0.0–0.1)
Basophils Relative: 1 %
Eosinophils Absolute: 0 10*3/uL (ref 0.0–0.5)
Eosinophils Relative: 0 %
HCT: 37.1 % (ref 36.0–46.0)
Hemoglobin: 11.2 g/dL — ABNORMAL LOW (ref 12.0–15.0)
Immature Granulocytes: 1 %
Lymphocytes Relative: 30 %
Lymphs Abs: 1.9 10*3/uL (ref 0.7–4.0)
MCH: 24.1 pg — ABNORMAL LOW (ref 26.0–34.0)
MCHC: 30.2 g/dL (ref 30.0–36.0)
MCV: 79.8 fL — ABNORMAL LOW (ref 80.0–100.0)
Monocytes Absolute: 0.7 10*3/uL (ref 0.1–1.0)
Monocytes Relative: 11 %
Neutro Abs: 3.8 10*3/uL (ref 1.7–7.7)
Neutrophils Relative %: 57 %
Platelets: 333 10*3/uL (ref 150–400)
RBC: 4.65 MIL/uL (ref 3.87–5.11)
RDW: 17 % — ABNORMAL HIGH (ref 11.5–15.5)
WBC: 6.5 10*3/uL (ref 4.0–10.5)
nRBC: 0 % (ref 0.0–0.2)

## 2019-10-17 LAB — COMPREHENSIVE METABOLIC PANEL
ALT: 29 U/L (ref 0–44)
AST: 25 U/L (ref 15–41)
Albumin: 3.4 g/dL — ABNORMAL LOW (ref 3.5–5.0)
Alkaline Phosphatase: 43 U/L (ref 38–126)
Anion gap: 12 (ref 5–15)
BUN: 19 mg/dL (ref 6–20)
CO2: 27 mmol/L (ref 22–32)
Calcium: 8.9 mg/dL (ref 8.9–10.3)
Chloride: 101 mmol/L (ref 98–111)
Creatinine, Ser: 0.45 mg/dL (ref 0.44–1.00)
GFR calc Af Amer: >60 mL/min (ref >60)
GFR calc non Af Amer: >60 mL/min (ref >60)
Glucose, Bld: 61 mg/dL — ABNORMAL LOW (ref 70–99)
Potassium: 3.5 mmol/L (ref 3.5–5.1)
Sodium: 140 mmol/L (ref 135–145)
Total Bilirubin: 0.6 mg/dL (ref 0.3–1.2)
Total Protein: 7.4 g/dL (ref 6.5–8.1)

## 2019-10-17 LAB — CULTURE, BLOOD (ROUTINE X 2): Culture: NO GROWTH

## 2019-10-17 LAB — D-DIMER, QUANTITATIVE: D-Dimer, Quant: 1.13 ug/mL-FEU — ABNORMAL HIGH (ref 0.00–0.50)

## 2019-10-17 LAB — C-REACTIVE PROTEIN: CRP: 1.3 mg/dL — ABNORMAL HIGH (ref <1.0)

## 2019-10-17 MED ORDER — DIAZEPAM 5 MG PO TABS
5.0000 mg | ORAL_TABLET | Freq: Three times a day (TID) | ORAL | Status: DC
  Administered 2019-10-17 – 2019-10-18 (×4): 5 mg via ORAL
  Filled 2019-10-17 (×4): qty 1

## 2019-10-17 MED ORDER — HYDROMORPHONE HCL 2 MG PO TABS
4.0000 mg | ORAL_TABLET | Freq: Three times a day (TID) | ORAL | Status: DC
  Administered 2019-10-17 – 2019-10-18 (×4): 4 mg via ORAL
  Filled 2019-10-17 (×4): qty 2

## 2019-10-17 MED ORDER — HYDRALAZINE HCL 50 MG PO TABS: 25.0000 mg | ORAL_TABLET | Freq: Four times a day (QID) | ORAL | Status: DC | PRN

## 2019-10-17 MED ORDER — NITROFURANTOIN MONOHYD MACRO 100 MG PO CAPS
100.0000 mg | ORAL_CAPSULE | Freq: Every day | ORAL | Status: DC
  Administered 2019-10-17: 21:00:00 100 mg via ORAL
  Filled 2019-10-17 (×2): qty 1

## 2019-10-17 NOTE — Progress Notes (Signed)
Dressing change completed. Pt states she only does a change once a day.

## 2019-10-17 NOTE — Plan of Care (Signed)

## 2019-10-17 NOTE — Progress Notes (Signed)
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  In the emergency room, patient found to be afebrile, but tachypneic with no evidence of PE, but noted new left pleural effusion and scattered consolidation suspicious for pneumonia.  Covid test was positive.  Patient had had a negative Covid test as an outpatient on 10/16.  Echocardiogram was normal.  Cardiology consult did not feel patient's heart failure explains her symptoms.  Aspiration ruled out.  Despite diuresis, patient became more hypoxic chest x-ray noted worsening bilateral airspace disease with CRP elevated at 10.4.  Patient transferred to [facility] campus and started on steroids and Remdisivir.  Since hospitalization, patient has been slowly improving and is currently off of oxygen altogether.  She is breathing much more comfortably without any discomfort.  She has had episodes with elevated blood pressure ranging in the 140s to 160s.  Patient denies any other symptoms.  She does not have a previous history of high blood pressure  Assessment/Plan: Principal Problem:   Acute respiratory failure with hypoxia (HCC) secondary to COVID-19 infection/left pleural effusion: Unclear if patient had false negative Covid test on 10/16 versus true reinfection.  Either way she seems to be improving clinically.  Continue steroids.  Finishes Remdisivir on 10/27.  Echocardiogram does not note cardiac cause.  PE ruled out. Active Problems: Acute blood loss anemia secondary to vaginal bleeding: Hemoglobin now stable.  Anxiety:  Continue as needed Valium.  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  Secondary neurogenic bladder and chronic suprapubic catheter: Catheter care accordingly  Elevated blood pressure: Possible it could be from withdrawals.  Patient is on 3 times daily Valium and Dilaudid which at home, she states that she takes scheduled and here she is receiving it as needed.  Have changed this to schedule.  Have also added as needed oral hydralazine.  Checking urinalysis to rule out new infection of UTI.  Also potentially possible she could be developing an infection secondary to her wound ulcers which she refuses to let nurses treat.    Pressure injury of contiguous region involving back, buttock, and hip, multiple stages: Present on admission.  At times, patient does not allow nursing to treat decubitus wounds.  As listed below. Pressure Injury 01/19/18 Stage II -  Partial thickness loss of dermis presenting as a shallow open ulcer with a red, pink wound bed without slough. (Active)  01/19/18 0500  Location: Hip  Location Orientation: Anterior;Left;Proximal;Upper  Staging: Stage II -  Partial thickness loss of dermis presenting as a shallow open ulcer with a red, pink wound bed without slough.  Wound Description (Comments):   Present on Admission: Yes     Pressure Injury 07/09/18 Stage III -  Full thickness tissue loss. Subcutaneous fat may be visible but bone, tendon or muscle are NOT exposed. labial folds, perineum (Active)  07/09/18 2030  Location: Other (Comment) (under left buttock fold )  Location Orientation: Left  Staging: Stage III -  Full thickness tissue loss. Subcutaneous fat may be visible but bone, tendon or muscle are NOT exposed.  Wound Description (Comments): labial  folds, perineum  Present on Admission: Yes     Pressure Injury 07/09/18 Stage III -  Full thickness tissue loss. Subcutaneous fat may be visible but bone,  tendon or muscle are NOT exposed. per patient area of extra skin  (Active)  07/09/18 2030  Location: Hip  Location Orientation: Left  Staging: Stage III -  Full thickness tissue loss. Subcutaneous fat may be visible but bone, tendon or muscle are NOT exposed.  Wound Description (Comments): per patient area of extra skin   Present on Admission: Yes     Pressure Injury 07/09/18 per patient pressure wound from where laptop rests in her lap  (Active)  07/09/18 2030  Location: Thigh  Location Orientation: Right  Staging:   Wound Description (Comments): per patient pressure wound from where laptop rests in her lap   Present on Admission: Yes     Pressure Injury 07/09/18 Deep Tissue Injury - Purple or maroon localized area of discolored intact skin or blood-filled blister due to damage of underlying soft tissue from pressure and/or shear. (Active)  07/09/18 1734  Location: Heel  Location Orientation: Left  Staging: Deep Tissue Injury - Purple or maroon localized area of discolored intact skin or blood-filled blister due to damage of underlying soft tissue from pressure and/or shear.  Wound Description (Comments):   Present on Admission: Yes     Pressure Injury 07/12/18 Deep Tissue Injury - Purple or maroon localized area of discolored intact skin or blood-filled blister due to damage of underlying soft tissue from pressure and/or shear. (Active)  07/12/18 1737  Location: Heel  Location Orientation: Right  Staging: Deep Tissue Injury - Purple or maroon localized area of discolored intact skin or blood-filled blister due to damage of underlying soft tissue from pressure and/or shear.  Wound Description (Comments):   Present on Admission: Yes     Pressure Injury 09/08/19 Ischial tuberosity Left Stage III -  Full thickness tissue loss. Subcutaneous fat may be visible but bone, tendon or muscle are NOT exposed. red, draining (Active)  09/08/19 1913  Location: Ischial tuberosity  Location  Orientation: Left  Staging: Stage III -  Full thickness tissue loss. Subcutaneous fat may be visible but bone, tendon or muscle are NOT exposed.  Wound Description (Comments): red, draining  Present on Admission: Yes     Pressure Injury Ischial tuberosity Right Stage III -  Full thickness tissue loss. Subcutaneous fat may be visible but bone, tendon or muscle are NOT exposed. red, draining (Active)     Location: Ischial tuberosity  Location Orientation: Right  Staging: Stage III -  Full thickness tissue loss. Subcutaneous fat may be visible but bone, tendon or muscle are NOT exposed.  Wound Description (Comments): red, draining  Present on Admission: Yes     Pressure Injury 09/08/19 Sacrum Stage I -  Intact skin with non-blanchable redness of a localized area usually over a bony prominence. pink, unblanchable (Active)  09/08/19 1914  Location: Sacrum  Location Orientation:   Staging: Stage I -  Intact skin with non-blanchable redness of a localized area usually over a bony prominence.  Wound Description (Comments): pink, unblanchable  Present on Admission: Yes     Code Status: Full code  Family Communication: Spoke with husband by phone   Disposition Plan: Anticipate should be able to be d/c Tuesday after finishing Remdisivir    Consultants:  Cardiology   Procedures:  Echocardiogram done 10/22: Preserved ejection fraction, no evidence of diastolic dysfunction, no valvular dysfunction  Antimicrobials:  Ceftriaxone, azithromycin 10/20 - 10/22  Vancomycin, meropenem 10/20  Remdesivir 9/20 - 9/24; restarted 10/23 - 10/27.  DVT prophylaxis: Lovenox   Objective: Vitals:   10/17/19 0811 10/17/19 1126  BP: (!) 157/102 126/84  Pulse: 60 73  Resp: 18 20  Temp: 98.5 F (36.9 C) 98.4 F (36.9 C)  SpO2: 94% 94%    Intake/Output Summary (Last 24 hours) at 10/17/2019 1336 Last data filed at 10/17/2019 0934 Gross per 24 hour  Intake 600 ml  Output 900 ml  Net -300  ml   Filed Weights   10/15/19 0513 10/16/19 0400 10/17/19 0300  Weight: 42 kg 49.4 kg 48.1 kg   Body mass index is 23.77 kg/m.  Exam:   General: Alert and oriented x3, no acute distress  Cardiovascular: Regular rate and rhythm, S1-S2  Respiratory: Clear auscultation bilaterally  Abdomen: Soft, nontender, nondistended, positive bowel sounds  Musculoskeletal: No clubbing or cyanosis or edema  Skin: Multiple decubitus ulcerations as noted above  Psychiatry: Appropriate, no evidence of psychoses   Data Reviewed: CBC: Recent Labs  Lab 10/12/19 0431 10/13/19 0417 10/15/19 0119 10/16/19 0115 10/17/19 0415  WBC 6.9 5.3 6.5 6.2 6.5  NEUTROABS 4.9 3.4 4.7 4.8 3.8  HGB 9.2* 9.7* 10.3* 11.3* 11.2*  HCT 32.4* 33.7* 34.5* 37.4 37.1  MCV 86.9 85.5 81.4 80.1 79.8*  PLT 235 206 330 349 333   Basic Metabolic Panel: Recent Labs  Lab 10/12/19 0431 10/13/19 0417 10/15/19 0119 10/16/19 0115 10/17/19 0415  NA 136 136 139 138 140  K 4.4 4.2 3.9 3.7 3.5  CL 101 94* 97* 99 101  CO2 28 29 30 28 27   GLUCOSE 84 86 96 175* 61*  BUN 9 8 11 15 19   CREATININE 0.37* 0.40* 0.43* 0.40* 0.45  CALCIUM 8.1* 8.1* 8.9 9.0 8.9  MG  --  2.1  --   --   --   PHOS  --  3.5  --   --   --    GFR: Estimated Creatinine Clearance: 64.7 mL/min (by C-G formula based on SCr of 0.45 mg/dL). Liver Function Tests: Recent Labs  Lab 10/11/19 2051 10/13/19 0417 10/15/19 0119 10/16/19 0115 10/17/19 0415  AST 19 25 20 25 25   ALT 19 26 23 29 29   ALKPHOS 62 59 50 52 43  BILITOT 0.2* 0.6 1.2 0.8 0.6  PROT 7.2 6.6 7.5 7.8 7.4  ALBUMIN 3.0* 2.8* 3.4* 3.6 3.4*   No results for input(s): LIPASE, AMYLASE in the last 168 hours. No results for input(s): AMMONIA in the last 168 hours. Coagulation Profile: Recent Labs  Lab 10/11/19 2200  INR 1.0   Cardiac Enzymes: No results for input(s): CKTOTAL, CKMB, CKMBINDEX, TROPONINI in the last 168 hours. BNP (last 3 results) No results for input(s): PROBNP  in the last 8760 hours. HbA1C: No results for input(s): HGBA1C in the last 72 hours. CBG: No results for input(s): GLUCAP in the last 168 hours. Lipid Profile: No results for input(s): CHOL, HDL, LDLCALC, TRIG, CHOLHDL, LDLDIRECT in the last 72 hours. Thyroid Function Tests: No results for input(s): TSH, T4TOTAL, FREET4, T3FREE, THYROIDAB in the last 72 hours. Anemia Panel: No results for input(s): VITAMINB12, FOLATE, FERRITIN, TIBC, IRON, RETICCTPCT in the last 72 hours. Urine analysis:    Component Value Date/Time   COLORURINE AMBER (A) 10/11/2019 2100   APPEARANCEUR CLOUDY (A) 10/11/2019 2100   LABSPEC 1.026 10/11/2019 2100   PHURINE 5.0 10/11/2019 2100   GLUCOSEU NEGATIVE 10/11/2019 2100   HGBUR  NEGATIVE 10/11/2019 2100   BILIRUBINUR NEGATIVE 10/11/2019 2100   KETONESUR NEGATIVE 10/11/2019 2100   PROTEINUR 100 (A) 10/11/2019 2100   UROBILINOGEN 0.2 08/03/2015 1714   NITRITE POSITIVE (A) 10/11/2019 2100   LEUKOCYTESUR LARGE (A) 10/11/2019 2100   Sepsis Labs: (procalcitonin:4,lacticidven:4)  ) Recent Results (from the past 240 hour(s))  Blood Culture (routine x 2)     Status: None   Collection Time: 10/11/19  8:06 PM   Specimen: BLOOD RIGHT HAND  Result Value Ref Range Status   Specimen Description   Final    BLOOD RIGHT HAND Performed at [facility], [address], [place], [place] [address]    Special Requests   Final    BOTTLES DRAWN AEROBIC ONLY Blood Culture results may not be optimal due to an inadequate volume of blood received in culture bottles Performed at [facility], [address], [place], [place] [address]    Culture   Final    NO GROWTH 5 DAYS Performed at [facility], [address] [place], [place] [address]    Report Status 10/17/2019 FINAL  Final  Blood Culture (routine x 2)     Status: None   Collection Time: 10/11/19  8:51 PM   Specimen: BLOOD  Result Value Ref Range Status    Specimen Description   Final    BLOOD BLOOD LEFT FOREARM Performed at [facility], [address], [place], [place] [address]    Special Requests   Final    BOTTLES DRAWN AEROBIC ONLY Blood Culture adequate volume Performed at [facility], [address] [place], [place] [address]    Culture   Final    NO GROWTH 5 DAYS Performed at [facility], [address] [place], [place] [address]    Report Status 10/16/2019 FINAL  Final  Urine culture     Status: Abnormal   Collection Time: 10/11/19  9:00 PM   Specimen: In/Out Cath Urine  Result Value Ref Range Status   Specimen Description   Final    IN/OUT CATH URINE Performed at [facility], [address], [place], [place] [address]    Special Requests   Final    NONE Performed at [facility], [address], [place], [place] [address]    Culture MULTIPLE SPECIES PRESENT, SUGGEST RECOLLECTION (A)  Final   Report Status 10/13/2019 FINAL  Final  SARS CORONAVIRUS 2 (TAT 6-24 HRS) Nasopharyngeal Nasopharyngeal Swab     Status: Abnormal   Collection Time: 10/11/19  9:13 PM   Specimen: Nasopharyngeal Swab  Result Value Ref Range Status   SARS Coronavirus 2 POSITIVE (A) NEGATIVE Final    Comment: RESULT CALLED TO, READ BACK BY AND VERIFIED WITH: Tonita Phoenix RN 13:40 10/12/19 ([email or web address]) (NOTE) SARS-CoV-2 target nucleic acids are DETECTED. The SARS-CoV-2 RNA is generally detectable in upper and lower respiratory specimens during the acute phase of infection. Positive results are indicative of active infection with SARS-CoV-2. Clinical  correlation with patient history and other diagnostic information is necessary to determine patient infection status. Positive results do  not rule out bacterial infection or co-infection with other viruses. The expected result is Negative. Fact Sheet for Patients: HairSlick.no Fact Sheet for  Healthcare Providers: quierodirigir.com This test is not yet approved or cleared by the [place] FDA and  has been authorized for detection and/or diagnosis of SARS-CoV-2 by FDA under an Emergency Use Authorization (EUA). This  EUA will remain  in effect (meaning this test can be used) fo r the duration of the COVID-19 declaration under Section 564(b)(1) of the Act, 21 U.S.C. section 360bbb-3(b)(1), unless the authorization is terminated or revoked sooner. Performed at [facility], [place] [address] [place], [place] [address]       Studies: No results found.  Scheduled Meds: . darifenacin  15 mg Oral Daily  . dexamethasone (DECADRON) injection  6 mg Intravenous Q24H  . diazepam  5 mg Oral TID PC  . enoxaparin (LOVENOX) injection  30 mg Subcutaneous Q24H  . HYDROmorphone  4 mg Oral TID PC  . neomycin-bacitracin-polymyxin   Topical Daily  . nitrofurantoin (macrocrystal-monohydrate)  100 mg Oral QHS  . pantoprazole  40 mg Oral Daily  . pregabalin  75 mg Oral TID  . sodium chloride flush  3 mL Intravenous Q12H  . tiZANidine  2 mg Oral QPM    Continuous Infusions: . sodium chloride    . remdesivir 100 mg in NS 250 mL 100 mg (10/17/19 0915)     LOS: 5 days     Annita Brod, MD [facility]  To reach me or the doctor on call, go to: [email or web address] Password [email or web address]  10/17/2019, 1:36 PM

## 2019-10-17 NOTE — Progress Notes (Signed)
Patient continues to refuse pm dressing changes and q 2 turns. Understands risks of further decline. Will continue to encourage and monitor.

## 2019-10-18 DIAGNOSIS — Z9359 Other cystostomy status: Secondary | ICD-10-CM

## 2019-10-18 DIAGNOSIS — R030 Elevated blood-pressure reading, without diagnosis of hypertension: Secondary | ICD-10-CM | POA: Diagnosis present

## 2019-10-18 LAB — COMPREHENSIVE METABOLIC PANEL
ALT: 28 U/L (ref 0–44)
AST: 24 U/L (ref 15–41)
Albumin: 3.4 g/dL — ABNORMAL LOW (ref 3.5–5.0)
Alkaline Phosphatase: 42 U/L (ref 38–126)
Anion gap: 10 (ref 5–15)
BUN: 18 mg/dL (ref 6–20)
CO2: 26 mmol/L (ref 22–32)
Calcium: 8.5 mg/dL — ABNORMAL LOW (ref 8.9–10.3)
Chloride: 101 mmol/L (ref 98–111)
Creatinine, Ser: 0.47 mg/dL (ref 0.44–1.00)
GFR calc Af Amer: >60 mL/min (ref >60)
GFR calc non Af Amer: >60 mL/min (ref >60)
Glucose, Bld: 90 mg/dL (ref 70–99)
Potassium: 3.8 mmol/L (ref 3.5–5.1)
Sodium: 137 mmol/L (ref 135–145)
Total Bilirubin: 0.5 mg/dL (ref 0.3–1.2)
Total Protein: 7.2 g/dL (ref 6.5–8.1)

## 2019-10-18 LAB — URINE CULTURE: Culture: NO GROWTH

## 2019-10-18 LAB — CBC WITH DIFFERENTIAL/PLATELET
Abs Immature Granulocytes: 0.03 10*3/uL (ref 0.00–0.07)
Basophils Absolute: 0 10*3/uL (ref 0.0–0.1)
Basophils Relative: 0 %
Eosinophils Absolute: 0 10*3/uL (ref 0.0–0.5)
Eosinophils Relative: 0 %
HCT: 36 % (ref 36.0–46.0)
Hemoglobin: 10.9 g/dL — ABNORMAL LOW (ref 12.0–15.0)
Immature Granulocytes: 1 %
Lymphocytes Relative: 25 %
Lymphs Abs: 1.4 10*3/uL (ref 0.7–4.0)
MCH: 24.1 pg — ABNORMAL LOW (ref 26.0–34.0)
MCHC: 30.3 g/dL (ref 30.0–36.0)
MCV: 79.6 fL — ABNORMAL LOW (ref 80.0–100.0)
Monocytes Absolute: 0.4 10*3/uL (ref 0.1–1.0)
Monocytes Relative: 6 %
Neutro Abs: 3.8 10*3/uL (ref 1.7–7.7)
Neutrophils Relative %: 68 %
Platelets: 296 10*3/uL (ref 150–400)
RBC: 4.52 MIL/uL (ref 3.87–5.11)
RDW: 16.7 % — ABNORMAL HIGH (ref 11.5–15.5)
WBC: 5.6 10*3/uL (ref 4.0–10.5)
nRBC: 0 % (ref 0.0–0.2)

## 2019-10-18 LAB — C-REACTIVE PROTEIN: CRP: 1.1 mg/dL — ABNORMAL HIGH (ref <1.0)

## 2019-10-18 LAB — D-DIMER, QUANTITATIVE: D-Dimer, Quant: 0.97 ug/mL-FEU — ABNORMAL HIGH (ref 0.00–0.50)

## 2019-10-18 MED ORDER — DEXAMETHASONE 6 MG PO TABS: ORAL_TABLET | ORAL | 0 refills | Status: AC

## 2019-10-18 NOTE — Care Management Important Message (Signed)
Important Message  Patient Details  Name: [name] MRN: [id number] Date of Birth: 23-Sep-1986   Medicare Important Message Given:  Yes - Important Message mailed due to current National Emergency  Verbal consent obtained due to current National Emergency  Relationship to patient: Self Contact Name: Steele Ledonne Call Date: 10/18/19  Time: 1215 Phone: [phone] Outcome: Spoke with contact Important Message mailed to: Other (must enter comment)(emailed to [email or web address])    Arty Lantzy P Diasha Castleman 10/18/2019, 12:44 PM

## 2019-10-18 NOTE — TOC Transition Note (Signed)
Transition of Care [facility]) - CM/SW Discharge Note   Patient Details  Name: [name] MRN: [id number] Date of Birth: [birth year]  [facility] CM/SW Contact:  Ninfa Meeker, RN Phone Number: 10/18/2019, 1:31 PM   Clinical Narrative:   Case manager spoke with patient via telephone concerning discharge plan. She is active with [facility] and services will resume at discharge. MD has entered orders. Patient lives home with spouse. [facility] arranged for transport home, patient will be next available. Medical necessity and face sheet printed to nursing unit.    Final next level of care: [place] Barriers to Discharge: No Barriers Identified   Patient Goals and CMS Choice Patient states their goals for this hospitalization and ongoing recovery are:: To get better to return home.      Discharge Placement                       Discharge Plan and Services In-house Referral: Financial Counselor Discharge Planning Services: CM Consult Post Acute Care Choice: Resumption of Svcs/PTA Provider          DME Arranged: N/A DME Agency: NA   Time DME Agency Contacted: 64 Representative spoke with at DME Agency: Santiago Glad, RN  pt was active with PT/OT HH Arranged: PT, OT HH Agency: [place] ([place]) Date [place]: 10/18/19 Time [place]: 1100 Representative spoke with at [place]: [place] (SDOH) Interventions     Readmission Risk Interventions No flowsheet data found.

## 2019-10-18 NOTE — Discharge Summary (Signed)
Discharge Summary  [name] ZOX:[id number] DOB: 07-09-1986  PCP: [facility]  Admit date: 10/11/2019 Discharge date: 10/18/2019  Time spent: 25 minutes   Recommendations for Outpatient Follow-up:  1. New medicine: Decadron for 5 more days, then 4 day taper 2. Pt will isolate at home as per Covid requirements 3. Patient will monitor her blood pressure twice a day with a log.  If she has persistent blood pressure greater than 150, she will report this to her primary care doctor.  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  In the emergency room, patient found to be afebrile, but tachypneic  with no evidence of PE, but noted new left pleural effusion and scattered consolidation suspicious for pneumonia.  Covid test was positive.  Patient had had a negative Covid test as an outpatient on 10/16.  Echocardiogram was normal.  Cardiology consult did not feel patient's heart failure explains her symptoms.  Aspiration ruled out.  Despite diuresis, patient became more hypoxic chest x-ray noted worsening bilateral airspace disease with CRP elevated at 10.4.  Patient transferred to [facility] and started on steroids and Remdisivir.  Hospital Course:  Since hospitalization, patient has been slowly improving and is currently off of oxygen altogether.  She is breathing much more comfortably without any discomfort.  She has had episodes with elevated blood pressure ranging in the 140s to 160s.  Patient denies any other symptoms.  She does not have a previous history of high blood pressure  Assessment/Plan: Principal Problem:   Acute respiratory failure with hypoxia (HCC) secondary to COVID-19 infection/left pleural effusion: Unclear if patient had false negative Covid test on 10/16 versus true reinfection.  Either way she seems to be improving clinically.  Continue steroids.  Finishes Remdisivir on 10/27.  Echocardiogram does not note cardiac cause.  PE ruled out.  Weaned off of oxygen by 10/26. Active Problems: Acute blood loss anemia secondary to vaginal bleeding: Hemoglobin now stable.  Anxiety: Continue as needed Valium.  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  Secondary neurogenic bladder and chronic suprapubic catheter: Catheter care accordingly  Elevated blood pressure: Possible it could be from mild withdrawals.  Patient is  on 3 times daily Valium and Dilaudid which at home, she states that she takes scheduled and here she is receiving it as needed.    This was changed to a scheduled 3 times daily dosing on  10/26.  Also added as needed hydralazine.  Since then, patient's blood pressure has improved slightly.  Was again elevated today, 10/27, on day of discharge.  With a systolic in the 811X.  I recommended patient keep track of her blood pressures in the log.  She will discuss this with her primary care physician and if her blood pressures are above 150, would recommend they start her on an oral antihypertensive such as oral hydralazine.  Urinalysis negative ruling out new infection.      Pressure injury of contiguous region involving back, buttock, and hip, multiple stages: Present on admission.  At times, patient does not allow nursing to treat decubitus wounds.  As listed below. Pressure Injury 01/19/18 Stage II -  Partial thickness loss of dermis presenting as a shallow open ulcer with a red, pink wound bed without slough. (Active)  01/19/18 0500  Location: Hip  Location Orientation: Anterior;Left;Proximal;Upper  Staging: Stage II -  Partial thickness loss of dermis presenting as a shallow open ulcer with a red, pink wound bed without slough.  Wound Description (Comments):   Present on Admission: Yes     Pressure Injury 07/09/18 Stage III -  Full thickness tissue loss. Subcutaneous fat may be visible but bone, tendon or muscle are NOT exposed. labial folds, perineum (Active)  07/09/18 2030  Location: Other (Comment) (under left buttock fold )  Location Orientation: Left  Staging: Stage III -  Full thickness tissue loss. Subcutaneous fat may be visible but bone, tendon or muscle are NOT exposed.  Wound Description (Comments): labial folds, perineum  Present on Admission: Yes     Pressure Injury 07/09/18 Stage III -  Full thickness tissue loss. Subcutaneous fat may be visible but bone, tendon or muscle are NOT exposed. per patient area of extra skin  (Active)  07/09/18 2030  Location: Hip  Location Orientation: Left  Staging: Stage III -  Full thickness tissue loss. Subcutaneous fat may be  visible but bone, tendon or muscle are NOT exposed.  Wound Description (Comments): per patient area of extra skin   Present on Admission: Yes     Pressure Injury 07/09/18 per patient pressure wound from where laptop rests in her lap  (Active)  07/09/18 2030  Location: Thigh  Location Orientation: Right  Staging:   Wound Description (Comments): per patient pressure wound from where laptop rests in her lap   Present on Admission: Yes     Pressure Injury 07/09/18 Deep Tissue Injury - Purple or maroon localized area of discolored intact skin or blood-filled blister due to damage of underlying soft tissue from pressure and/or shear. (Active)  07/09/18 1734  Location: Heel  Location Orientation: Left  Staging: Deep Tissue Injury - Purple or maroon localized area of discolored intact skin or blood-filled blister due to damage of underlying soft tissue from pressure and/or shear.  Wound Description (Comments):   Present on Admission: Yes     Pressure Injury 07/12/18 Deep Tissue Injury - Purple or maroon localized area of discolored intact skin or blood-filled blister due to damage of underlying soft tissue from pressure and/or shear. (Active)  07/12/18 1737  Location: Heel  Location Orientation: Right  Staging: Deep Tissue Injury - Purple or maroon localized area of discolored intact skin or blood-filled  blister due to damage of underlying soft tissue from pressure and/or shear.  Wound Description (Comments):   Present on Admission: Yes     Pressure Injury 09/08/19 Ischial tuberosity Left Stage III -  Full thickness tissue loss. Subcutaneous fat may be visible but bone, tendon or muscle are NOT exposed. red, draining (Active)  09/08/19 1913  Location: Ischial tuberosity  Location Orientation: Left  Staging: Stage III -  Full thickness tissue loss. Subcutaneous fat may be visible but bone, tendon or muscle are NOT exposed.  Wound Description (Comments): red, draining  Present on Admission:  Yes     Pressure Injury Ischial tuberosity Right Stage III -  Full thickness tissue loss. Subcutaneous fat may be visible but bone, tendon or muscle are NOT exposed. red, draining (Active)     Location: Ischial tuberosity  Location Orientation: Right  Staging: Stage III -  Full thickness tissue loss. Subcutaneous fat may be visible but bone, tendon or muscle are NOT exposed.  Wound Description (Comments): red, draining  Present on Admission: Yes     Pressure Injury 09/08/19 Sacrum Stage I -  Intact skin with non-blanchable redness of a localized area usually over a bony prominence. pink, unblanchable (Active)  09/08/19 1914  Location: Sacrum  Location Orientation:   Staging: Stage I -  Intact skin with non-blanchable redness of a localized area usually over a bony prominence.  Wound Description (Comments): pink, unblanchable  Present on Admission: Yes    Consultants:  Cardiology   Procedures:  Echocardiogram done 10/22: Preserved ejection fraction, no evidence of diastolic dysfunction, no valvular dysfunction  Discharge Exam: BP (!) 168/92 (BP Location: Right Arm)    Pulse 81    Temp 97.9 F (36.6 C) (Oral)    Resp 18    Ht 4' 8"  (1.422 m)    Wt 45 kg    LMP  (LMP Unknown)    SpO2 95%    BMI 22.24 kg/m   General: Alert and oriented x3, no acute distress Cardiovascular: Regular rate and rhythm, S1-S2 Respiratory: Clear to auscultation bilaterally  Discharge Instructions You were cared for by a hospitalist during your hospital stay. If you have any questions about your discharge medications or the care you received while you were in the hospital after you are discharged, you can call the unit and asked to speak with the hospitalist on call if the hospitalist that took care of you is not available. Once you are discharged, your primary care physician will handle any further medical issues. Please note that NO REFILLS for any discharge medications will be authorized once you are  discharged, as it is imperative that you return to your primary care physician (or establish a relationship with a primary care physician if you do not have one) for your aftercare needs so that they can reassess your need for medications and monitor your lab values.  Discharge Instructions    Diet - low sodium heart healthy   Complete by: As directed    Increase activity slowly   Complete by: As directed    MyChart COVID-19 home monitoring program   Complete by: Oct 18, 2019    Is the patient willing to use the [facility] for home monitoring?: Yes   Temperature monitoring   Complete by: Oct 18, 2019    After how many days would you like to receive a notification of this patient's flowsheet entries?: 1     Allergies as of 10/18/2019  Reactions   Metoclopramide Hcl Other (See Comments)   Seizures   Lactose Intolerance (gi) Other (See Comments)   Gi upset   Benadryl [diphenhydramine Hcl] Rash   Gabapentin Swelling   Morphine Rash   Zosyn [piperacillin-tazobactam In Dex] Itching, Rash   Pt states that she is not aware of this allergy.       Medication List    TAKE these medications   Advil 200 MG tablet Generic drug: ibuprofen Take 200-400 mg by mouth every 6 (six) hours as needed for moderate pain.   albuterol 108 (90 Base) MCG/ACT inhaler Commonly known as: VENTOLIN HFA Inhale 2 puffs into the lungs every 4 (four) hours as needed for wheezing or shortness of breath.   dexamethasone 6 MG tablet Commonly known as: DECADRON Take 1 tablet (6 mg total) by mouth daily for 5 days, THEN 0.5 tablets (3 mg total) daily for 4 days. Start taking on: October 19, 2019   diazepam 5 MG tablet Commonly known as: VALIUM TAKE 1 TABLET(5 MG) BY MOUTH THREE TIMES DAILY   HYDROmorphone 4 MG tablet Commonly known as: DILAUDID Take 4 mg by mouth 3 (three) times daily.   ketoconazole 2 % cream Commonly known as: NIZORAL Apply 1 application topically daily as needed.  Red/irritated regions surrounding open wounds UTD.   Narcan 4 MG/0.1ML Liqd nasal spray kit Generic drug: naloxone Place 1 spray into the nose daily as needed (accidental overdose). PRF Accidental overdose   nitrofurantoin (macrocrystal-monohydrate) 100 MG capsule Commonly known as: MACROBID Take 1 capsule by mouth at bedtime.   norethindrone-ethinyl estradiol 0.5/0.75/1-35 MG-MCG tablet Commonly known as: Ortho-Novum 7/7/7 (28) Take 1 tablet by mouth daily.   pantoprazole 40 MG tablet Commonly known as: PROTONIX Take 1 tablet (40 mg total) by mouth every morning. Take 30 minutes before breakfast.   pregabalin 75 MG capsule Commonly known as: LYRICA TAKE 1 CAPSULE(75 MG) BY MOUTH THREE TIMES DAILY What changed:   how much to take  how to take this  when to take this  additional instructions   solifenacin 10 MG tablet Commonly known as: VESICARE Take 10 mg by mouth daily.   tiZANidine 2 MG tablet Commonly known as: ZANAFLEX Take 1 tablet (2 mg total) by mouth every 8 (eight) hours as needed for muscle spasms. What changed: when to take this      Allergies  Allergen Reactions   Metoclopramide Hcl Other (See Comments)    Seizures    Lactose Intolerance (Gi) Other (See Comments)    Gi upset   Benadryl [Diphenhydramine Hcl] Rash   Gabapentin Swelling   Morphine Rash   Zosyn [Piperacillin-Tazobactam In Dex] Itching and Rash    Pt states that she is not aware of this allergy.    [facility] Follow up in 1 month(s).   Contact information: [address] [place] [address] [phone]            The results of significant diagnostics from this hospitalization (including imaging, microbiology, ancillary and laboratory) are listed below for reference.    Significant Diagnostic Studies: Ct Angio Chest Pe W And/or Wo Contrast  Result Date: 10/11/2019 CLINICAL DATA:  Hypoxia abnormal chest x-ray EXAM: CT  ANGIOGRAPHY CHEST WITH CONTRAST TECHNIQUE: Multidetector CT imaging of the chest was performed using the standard protocol during bolus administration of intravenous contrast. Multiplanar CT image reconstructions and MIPs were obtained to evaluate the vascular anatomy. CONTRAST:  43m OMNIPAQUE IOHEXOL 350  MG/ML SOLN COMPARISON:  Chest x-ray 10/11/2019, CT 05/24/2019 FINDINGS: Cardiovascular: Respiratory motion degrades the study. Limited evaluation for subsegmental emboli within the bilateral lungs. No acute filling defects within the central or main segmental pulmonary arteries. Nonaneurysmal aorta. Mild cardiomegaly. No significant pericardial effusion Mediastinum/Nodes: Midline trachea. No thyroid mass. No increasing adenopathy. Esophagus within normal limits Lungs/Pleura: Small left-sided pleural effusion. Consolidation in the left greater than right lower lobes. Patchy consolidation and ground-glass density in the right upper lobe. Upper Abdomen: No acute abnormality Musculoskeletal: Scoliosis and vertebral anomalies. No acute or suspicious osseous abnormality. Review of the MIP images confirms the above findings. IMPRESSION: 1. Negative for acute central embolus. Limited evaluation of distal vessels due to respiratory motion 2. Small left-sided pleural effusion. Multifocal consolidation involving the lower lobes and right upper lobe with ground-glass opacity in the right upper lobe, suspicious for multifocal pneumonia. 3. Mild cardiomegaly Electronically Signed   By: Donavan Foil M.D.   On: 10/11/2019 23:44   Dg Chest Port 1 View  Result Date: 10/14/2019 CLINICAL DATA:  Shortness of breath EXAM: PORTABLE CHEST 1 VIEW COMPARISON:  10/13/2019 FINDINGS: Cardiomegaly with vascular congestion. Left lower lobe airspace opacity with probable small left effusion. No confluent opacity on the right. No acute bony abnormality. IMPRESSION: Cardiomegaly, vascular congestion. Left lower lobe atelectasis or infiltrate  with probable small left effusion. Improved aeration in the lungs since prior study. Electronically Signed   By: Rolm Baptise M.D.   On: 10/14/2019 07:56   Dg Chest Port 1 View  Result Date: 10/13/2019 CLINICAL DATA:  Shortness of breath.  COVID-19 positive. EXAM: PORTABLE CHEST 1 VIEW COMPARISON:  Radiographs yesterday. Chest CT 10/11/2019 FINDINGS: Low lung volumes persist. Slight worsening bilateral airspace disease. Left pleural effusion unchanged. Unchanged heart size and mediastinal contours. No pneumothorax. Cervical and thoracic spinal fusion hardware intact. IMPRESSION: Slight worsening bilateral airspace disease. Unchanged left pleural effusion. Electronically Signed   By: Keith Rake M.D.   On: 10/13/2019 05:30   Dg Chest Port 1 View  Result Date: 10/12/2019 CLINICAL DATA:  Covert positive with history of weakness. EXAM: PORTABLE CHEST 1 VIEW COMPARISON:  10/11/2019 FINDINGS: Heart size remains enlarged with dense retrocardiac opacification. Patchy opacities bilaterally similar to prior study. Signs of thoracic to upper lumbar spinal fusion and spinal curvature are unchanged. No acute bone process. IMPRESSION: No interval change in the appearance of the chest since 10/20/202020/2012. Electronically Signed   By: Zetta Bills M.D.   On: 10/12/2019 10:21   Dg Chest Port 1 View  Result Date: 10/11/2019 CLINICAL DATA:  Orthopnea EXAM: PORTABLE CHEST 1 VIEW COMPARISON:  September 11, 2019 FINDINGS: There is cardiomegaly. Pulmonary vascular congestion is seen. There is hazy ground-glass opacity in the right mid lung. There is blunting of the left costophrenic angle which could be due to a small effusion. Overlying surgical fixation hardware. IMPRESSION: 1. Pulmonary vascular congestion 2. Hazy airspace opacity in the right mid lung which is non-specific could be due to edema, atelectasis, and/or early infectious etiology. 3. Probable trace left pleural effusion Electronically Signed   By:  Prudencio Pair M.D.   On: 10/11/2019 20:40    Microbiology: Recent Results (from the past 240 hour(s))  Blood Culture (routine x 2)     Status: None   Collection Time: 10/11/19  8:06 PM   Specimen: BLOOD RIGHT HAND  Result Value Ref Range Status   Specimen Description   Final    BLOOD RIGHT HAND Performed at [facility], [place] [address], [place], [place] [address]    Special Requests   Final    BOTTLES DRAWN AEROBIC ONLY Blood Culture results may not be optimal due to an inadequate volume of blood received in culture bottles Performed at [facility] [address], [place], [place] [address]    Culture   Final    NO GROWTH 5 DAYS Performed at [facility], [place] [address] [place], [place] [address]    Report Status 10/17/2019 FINAL  Final  Blood Culture (routine x 2)     Status: None   Collection Time: 10/11/19  8:51 PM   Specimen: BLOOD  Result Value Ref Range Status   Specimen Description   Final    BLOOD BLOOD LEFT FOREARM Performed at [facility] [place] [address] [place], [place] [address]    Special Requests   Final    BOTTLES DRAWN AEROBIC ONLY Blood Culture adequate volume Performed at [facility] [address], [place], [place] [address]    Culture   Final    NO GROWTH 5 DAYS Performed at [facility], [place] [address], [place], [place] [address]    Report Status 10/16/2019 FINAL  Final  Urine culture     Status: Abnormal   Collection Time: 10/11/19  9:00 PM   Specimen: In/Out Cath Urine  Result Value Ref Range Status   Specimen Description   Final    IN/OUT CATH URINE Performed at [facility] [address], [place], [place] [address]    Special Requests   Final    NONE Performed at [facility], [place] [address], [place], [place] [address]    Culture MULTIPLE SPECIES PRESENT, SUGGEST RECOLLECTION (A)  Final   Report Status  10/13/2019 FINAL  Final  SARS CORONAVIRUS 2 (TAT 6-24 HRS) Nasopharyngeal Nasopharyngeal Swab     Status: Abnormal   Collection Time: 10/11/19  9:13 PM   Specimen: Nasopharyngeal Swab  Result Value Ref Range Status   SARS Coronavirus 2 POSITIVE (A) NEGATIVE Final    Comment: RESULT CALLED TO, READ BACK BY AND VERIFIED WITH: Franne Grip RN 13:40 10/12/19 ([email or web address]) (NOTE) SARS-CoV-2 target nucleic acids are DETECTED. The SARS-CoV-2 RNA is generally detectable in upper and lower respiratory specimens during the acute phase of infection. Positive results are indicative of active infection with SARS-CoV-2. Clinical  correlation with patient history and other diagnostic information is necessary to determine patient infection status. Positive results do  not rule out bacterial infection or co-infection with other viruses. The expected result is Negative. Fact Sheet for Patients: SugarRoll.be Fact Sheet for Healthcare Providers: https://www.woods-mathews.com/ This test is not yet approved or cleared by the [place] FDA and  has been authorized for detection and/or diagnosis of SARS-CoV-2 by FDA under an Emergency Use Authorization (EUA). This EUA will remain  in effect (meaning this test can be used) fo r the duration of the COVID-19 declaration under Section 564(b)(1) of the Act, 21 U.S.C. section 360bbb-3(b)(1), unless the authorization is terminated or revoked sooner. Performed at [facility], [place] [address] [place],  [address]      Labs: Basic Metabolic Panel: Recent Labs  Lab 10/13/19 0417 10/15/19 0119 10/16/19 0115 10/17/19 0415 10/18/19 0040  NA 136 139 138 140 137  K 4.2 3.9 3.7 3.5 3.8  CL 94* 97* 99 101 101  CO2 29 30 28 27 26   GLUCOSE 86 96 175*  61* 90  BUN 8 11 15 19 18   CREATININE 0.40* 0.43* 0.40* 0.45 0.47  CALCIUM 8.1* 8.9 9.0 8.9 8.5*  MG 2.1  --   --   --   --   PHOS 3.5  --   --   --   --     Liver Function Tests: Recent Labs  Lab 10/13/19 0417 10/15/19 0119 10/16/19 0115 10/17/19 0415 10/18/19 0040  AST 25 20 25 25 24   ALT 26 23 29 29 28   ALKPHOS 59 50 52 43 42  BILITOT 0.6 1.2 0.8 0.6 0.5  PROT 6.6 7.5 7.8 7.4 7.2  ALBUMIN 2.8* 3.4* 3.6 3.4* 3.4*   No results for input(s): LIPASE, AMYLASE in the last 168 hours. No results for input(s): AMMONIA in the last 168 hours. CBC: Recent Labs  Lab 10/13/19 0417 10/15/19 0119 10/16/19 0115 10/17/19 0415 10/18/19 0040  WBC 5.3 6.5 6.2 6.5 5.6  NEUTROABS 3.4 4.7 4.8 3.8 3.8  HGB 9.7* 10.3* 11.3* 11.2* 10.9*  HCT 33.7* 34.5* 37.4 37.1 36.0  MCV 85.5 81.4 80.1 79.8* 79.6*  PLT 206 330 349 333 296   Cardiac Enzymes: No results for input(s): CKTOTAL, CKMB, CKMBINDEX, TROPONINI in the last 168 hours. BNP: BNP (last 3 results) Recent Labs    10/11/19 2200  BNP 113.1*    ProBNP (last 3 results) No results for input(s): PROBNP in the last 8760 hours.  CBG: No results for input(s): GLUCAP in the last 168 hours.     Signed:  Annita Brod, MD [facility] 10/18/2019, 10:55 AM

## 2019-10-18 NOTE — Plan of Care (Signed)

## 2019-10-18 NOTE — Progress Notes (Signed)
PIV removed, AVS given to [place], No further questions. D/c via stretcher.

## 2019-10-18 NOTE — Discharge Instructions (Signed)
Pt has a history of asthma and is recovering from Covid.  She should not be around anyone who is smoking.  Patient will check her blood pressure twice a day & keep a record of this.  If her blood pressure stays above 150, she will report this to her primary care doctor.   COVID-19 COVID-19 is a respiratory infection that is caused by a virus called severe acute respiratory syndrome coronavirus 2 (SARS-CoV-2). The disease is also known as coronavirus disease or novel coronavirus. In some people, the virus may not cause any symptoms. In others, it may cause a serious infection. The infection can get worse quickly and can lead to complications, such as:  Pneumonia, or infection of the lungs.  Acute respiratory distress syndrome or ARDS. This is fluid build-up in the lungs.  Acute respiratory failure. This is a condition in which there is not enough oxygen passing from the lungs to the body.  Sepsis or septic shock. This is a serious bodily reaction to an infection.  Blood clotting problems.  Secondary infections due to bacteria or fungus. The virus that causes COVID-19 is contagious. This means that it can spread from person to person through droplets from coughs and sneezes (respiratory secretions). What are the causes? This illness is caused by a virus. You may catch the virus by:  Breathing in droplets from an infected person's cough or sneeze.  Touching something, like a table or a doorknob, that was exposed to the virus (contaminated) and then touching your mouth, nose, or eyes. What increases the risk? Risk for infection You are more likely to be infected with this virus if you:  Live in or travel to an area with a COVID-19 outbreak.  Come in contact with a sick person who recently traveled to an area with a COVID-19 outbreak.  Provide care for or live with a person who is infected with COVID-19. Risk for serious illness You are more likely to become seriously ill from the  virus if you:  Are 33 years of age or older.  Have a long-term disease that lowers your body's ability to fight infection (immunocompromised).  Live in a nursing home or long-term care facility.  Have a long-term (chronic) disease such as: ? Chronic lung disease, including chronic obstructive pulmonary disease or asthma ? Heart disease. ? Diabetes. ? Chronic kidney disease. ? Liver disease.  Are obese. What are the signs or symptoms? Symptoms of this condition can range from mild to severe. Symptoms may appear any time from 2 to 14 days after being exposed to the virus. They include:  A fever.  A cough.  Difficulty breathing.  Chills.  Muscle pains.  A sore throat.  Loss of taste or smell. Some people may also have stomach problems, such as nausea, vomiting, or diarrhea. Other people may not have any symptoms of COVID-19. How is this diagnosed? This condition may be diagnosed based on:  Your signs and symptoms, especially if: ? You live in an area with a COVID-19 outbreak. ? You recently traveled to or from an area where the virus is common. ? You provide care for or live with a person who was diagnosed with COVID-19.  A physical exam.  Lab tests, which may include: ? A nasal swab to take a sample of fluid from your nose. ? A throat swab to take a sample of fluid from your throat. ? A sample of mucus from your lungs (sputum). ? Blood tests.  Imaging tests, which  may include, X-rays, CT scan, or ultrasound. How is this treated? At present, there is no medicine to treat COVID-19. Medicines that treat other diseases are being used on a trial basis to see if they are effective against COVID-19. Your health care provider will talk with you about ways to treat your symptoms. For most people, the infection is mild and can be managed at home with rest, fluids, and over-the-counter medicines. Treatment for a serious infection usually takes places in a hospital intensive  care unit (ICU). It may include one or more of the following treatments. These treatments are given until your symptoms improve.  Receiving fluids and medicines through an IV.  Supplemental oxygen. Extra oxygen is given through a tube in the nose, a face mask, or a hood.  Positioning you to lie on your stomach (prone position). This makes it easier for oxygen to get into the lungs.  Continuous positive airway pressure (CPAP) or bi-level positive airway pressure (BPAP) machine. This treatment uses mild air pressure to keep the airways open. A tube that is connected to a motor delivers oxygen to the body.  Ventilator. This treatment moves air into and out of the lungs by using a tube that is placed in your windpipe.  Tracheostomy. This is a procedure to create a hole in the neck so that a breathing tube can be inserted.  Extracorporeal membrane oxygenation (ECMO). This procedure gives the lungs a chance to recover by taking over the functions of the heart and lungs. It supplies oxygen to the body and removes carbon dioxide. Follow these instructions at home: Lifestyle  If you are sick, stay home except to get medical care. Your health care provider will tell you how long to stay home. Call your health care provider before you go for medical care.  Rest at home as told by your health care provider.  Do not use any products that contain nicotine or tobacco, such as cigarettes, e-cigarettes, and chewing tobacco. If you need help quitting, ask your health care provider.  Return to your normal activities as told by your health care provider. Ask your health care provider what activities are safe for you. General instructions  Take over-the-counter and prescription medicines only as told by your health care provider.  Drink enough fluid to keep your urine pale yellow.  Keep all follow-up visits as told by your health care provider. This is important. How is this prevented?  There is no  vaccine to help prevent COVID-19 infection. However, there are steps you can take to protect yourself and others from this virus. To protect yourself:   Do not travel to areas where COVID-19 is a risk. The areas where COVID-19 is reported change often. To identify high-risk areas and travel restrictions, check the CDC travel website: StageSync.si  If you live in, or must travel to, an area where COVID-19 is a risk, take precautions to avoid infection. ? Stay away from people who are sick. ? Wash your hands often with soap and water for 20 seconds. If soap and water are not available, use an alcohol-based hand sanitizer. ? Avoid touching your mouth, face, eyes, or nose. ? Avoid going out in public, follow guidance from your state and local health authorities. ? If you must go out in public, wear a cloth face covering or face mask. ? Disinfect objects and surfaces that are frequently touched every day. This may include:  Counters and tables.  Doorknobs and light switches.  Sinks and faucets.  Electronics, such as phones, remote controls, keyboards, computers, and tablets. To protect others: If you have symptoms of COVID-19, take steps to prevent the virus from spreading to others.  If you think you have a COVID-19 infection, contact your health care provider right away. Tell your health care team that you think you may have a COVID-19 infection.  Stay home. Leave your house only to seek medical care. Do not use public transport.  Do not travel while you are sick.  Wash your hands often with soap and water for 20 seconds. If soap and water are not available, use alcohol-based hand sanitizer.  Stay away from other members of your household. Let healthy household members care for children and pets, if possible. If you have to care for children or pets, wash your hands often and wear a mask. If possible, stay in your own room, separate from others. Use a different  bathroom.  Make sure that all people in your household wash their hands well and often.  Cough or sneeze into a tissue or your sleeve or elbow. Do not cough or sneeze into your hand or into the air.  Wear a cloth face covering or face mask. Where to find more information  Centers for Disease Control and Prevention: PurpleGadgets.be  World Health Organization: https://www.castaneda.info/ Contact a health care provider if:  You live in or have traveled to an area where COVID-19 is a risk and you have symptoms of the infection.  You have had contact with someone who has COVID-19 and you have symptoms of the infection. Get help right away if:  You have trouble breathing.  You have pain or pressure in your chest.  You have confusion.  You have bluish lips and fingernails.  You have difficulty waking from sleep.  You have symptoms that get worse. These symptoms may represent a serious problem that is an emergency. Do not wait to see if the symptoms will go away. Get medical help right away. Call your local emergency services (911 in the [place] Do not drive yourself to the hospital. Let the emergency medical personnel know if you think you have COVID-19. Summary  COVID-19 is a respiratory infection that is caused by a virus. It is also known as coronavirus disease or novel coronavirus. It can cause serious infections, such as pneumonia, acute respiratory distress syndrome, acute respiratory failure, or sepsis.  The virus that causes COVID-19 is contagious. This means that it can spread from person to person through droplets from coughs and sneezes.  You are more likely to develop a serious illness if you are 57 years of age or older, have a weak immunity, live in a nursing home, or have chronic disease.  There is no medicine to treat COVID-19. Your health care provider will talk with you about ways to treat your symptoms.  Take steps to protect  yourself and others from infection. Wash your hands often and disinfect objects and surfaces that are frequently touched every day. Stay away from people who are sick and wear a mask if you are sick. This information is not intended to replace advice given to you by your health care provider. Make sure you discuss any questions you have with your health care provider. Document Released: 01/13/2019 Document Revised: 05/05/2019 Document Reviewed: 01/13/2019 Elsevier Patient Education  2020 [employer].

## 2019-10-19 ENCOUNTER — Encounter (INDEPENDENT_AMBULATORY_CARE_PROVIDER_SITE_OTHER): Payer: Self-pay

## 2019-10-20 ENCOUNTER — Encounter (INDEPENDENT_AMBULATORY_CARE_PROVIDER_SITE_OTHER): Payer: Self-pay

## 2019-10-21 ENCOUNTER — Encounter (INDEPENDENT_AMBULATORY_CARE_PROVIDER_SITE_OTHER): Payer: Self-pay

## 2019-10-22 ENCOUNTER — Encounter (INDEPENDENT_AMBULATORY_CARE_PROVIDER_SITE_OTHER): Payer: Self-pay

## 2019-10-23 ENCOUNTER — Encounter (INDEPENDENT_AMBULATORY_CARE_PROVIDER_SITE_OTHER): Payer: Self-pay

## 2019-10-24 ENCOUNTER — Encounter (INDEPENDENT_AMBULATORY_CARE_PROVIDER_SITE_OTHER): Payer: Self-pay

## 2019-10-25 ENCOUNTER — Encounter (INDEPENDENT_AMBULATORY_CARE_PROVIDER_SITE_OTHER): Payer: Self-pay

## 2019-10-26 ENCOUNTER — Encounter (INDEPENDENT_AMBULATORY_CARE_PROVIDER_SITE_OTHER): Payer: Self-pay

## 2019-10-26 ENCOUNTER — Ambulatory Visit: Payer: Medicare Other | Admitting: Family Medicine

## 2019-10-27 ENCOUNTER — Encounter (INDEPENDENT_AMBULATORY_CARE_PROVIDER_SITE_OTHER): Payer: Self-pay

## 2019-10-28 ENCOUNTER — Encounter (INDEPENDENT_AMBULATORY_CARE_PROVIDER_SITE_OTHER): Payer: Self-pay

## 2019-10-29 ENCOUNTER — Encounter (INDEPENDENT_AMBULATORY_CARE_PROVIDER_SITE_OTHER): Payer: Self-pay

## 2019-10-30 ENCOUNTER — Encounter (INDEPENDENT_AMBULATORY_CARE_PROVIDER_SITE_OTHER): Payer: Self-pay

## 2019-10-31 ENCOUNTER — Encounter (INDEPENDENT_AMBULATORY_CARE_PROVIDER_SITE_OTHER): Payer: Self-pay

## 2019-11-03 ENCOUNTER — Ambulatory Visit: Payer: Medicare Other | Admitting: Family Medicine

## 2019-11-22 DEATH — deceased

## 2019-11-24 ENCOUNTER — Other Ambulatory Visit: Payer: Self-pay | Admitting: Nurse Practitioner

## 2019-11-24 ENCOUNTER — Other Ambulatory Visit: Payer: Self-pay | Admitting: Neurology

## 2019-11-24 ENCOUNTER — Other Ambulatory Visit: Payer: Self-pay

## 2019-11-24 MED ORDER — PANTOPRAZOLE SODIUM 40 MG PO TBEC: 40.0000 mg | DELAYED_RELEASE_TABLET | ORAL | 3 refills | Status: AC

## 2019-11-24 NOTE — Telephone Encounter (Signed)
[name] would you like to see this patient before we refill the Pantoprazole? Please advise

## 2019-12-01 ENCOUNTER — Ambulatory Visit: Payer: Medicare Other | Admitting: Family Medicine

## 2019-12-01 ENCOUNTER — Encounter: Payer: Self-pay | Admitting: Family Medicine

## 2019-12-01 NOTE — Telephone Encounter (Signed)
Looks like I just saw her in August of this year. Can refill with 2 more refills

## 2020-03-28 ENCOUNTER — Telehealth: Payer: Self-pay | Admitting: Neurology

## 2020-03-28 NOTE — Telephone Encounter (Signed)
Called for reminder call- advised that pt had passed back in 11-12-19.

## 2020-04-03 ENCOUNTER — Ambulatory Visit: Payer: Medicare Other | Admitting: Neurology

## 2020-08-25 IMAGING — DX PORTABLE CHEST - 1 VIEW
1 series · 1 of 1 positions shown · non-contrast
Comparison: None.

CLINICAL DATA: Quadriplegic with worsening weakness.

EXAM:
PORTABLE CHEST 1 VIEW

[chest ap]
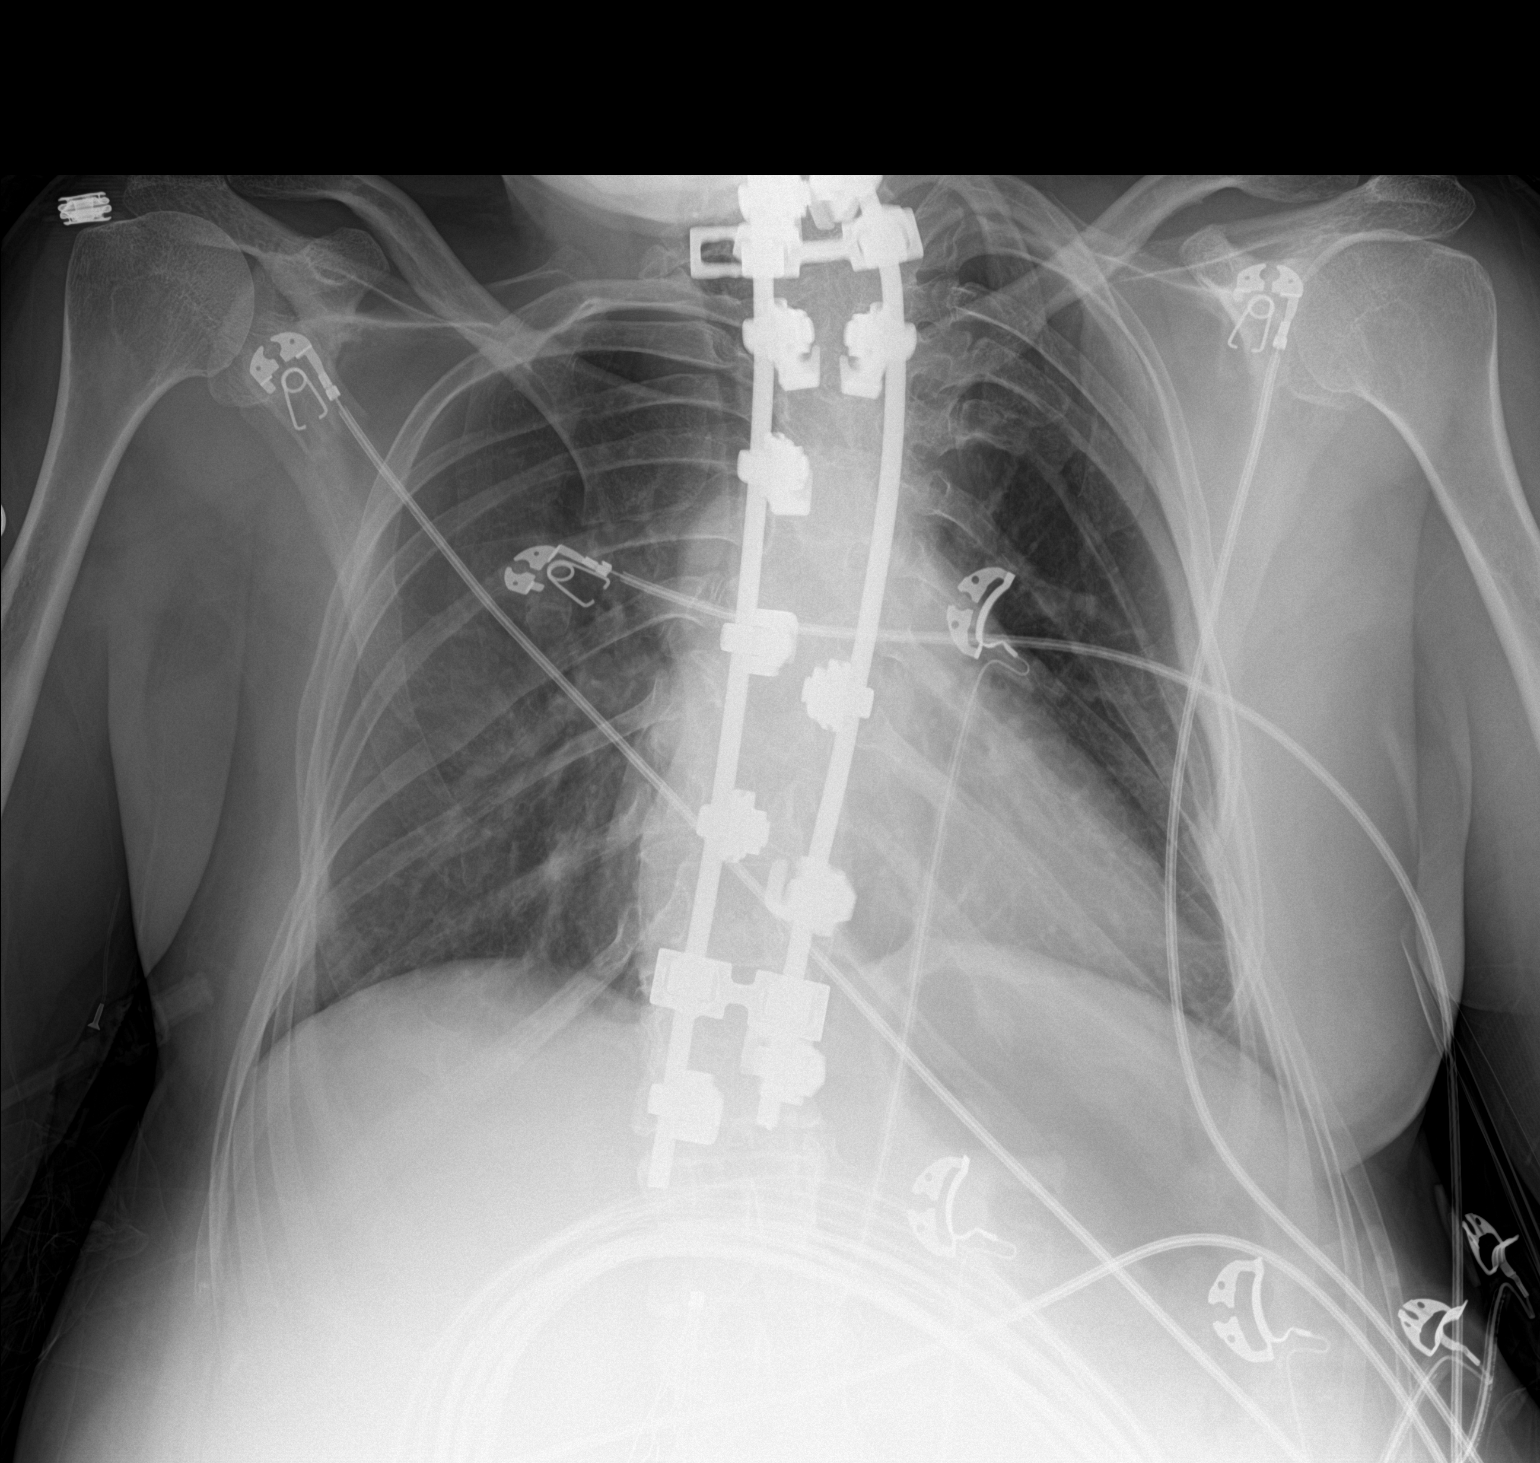

[1 of 1 positions shown; findings below may reference images not displayed]

FINDINGS: The heart size and mediastinal contours are within normal limits.
Both lungs are clear. Long segment spinal fusion.
IMPRESSION: No active disease.

## 2020-09-11 IMAGING — DX PORTABLE CHEST - 1 VIEW
1 series · 1 of 1 positions shown · non-contrast
Comparison: 05/07/2019

CLINICAL DATA: Chest pain and shortness of breath.

EXAM:
PORTABLE CHEST 1 VIEW

[chest ap]
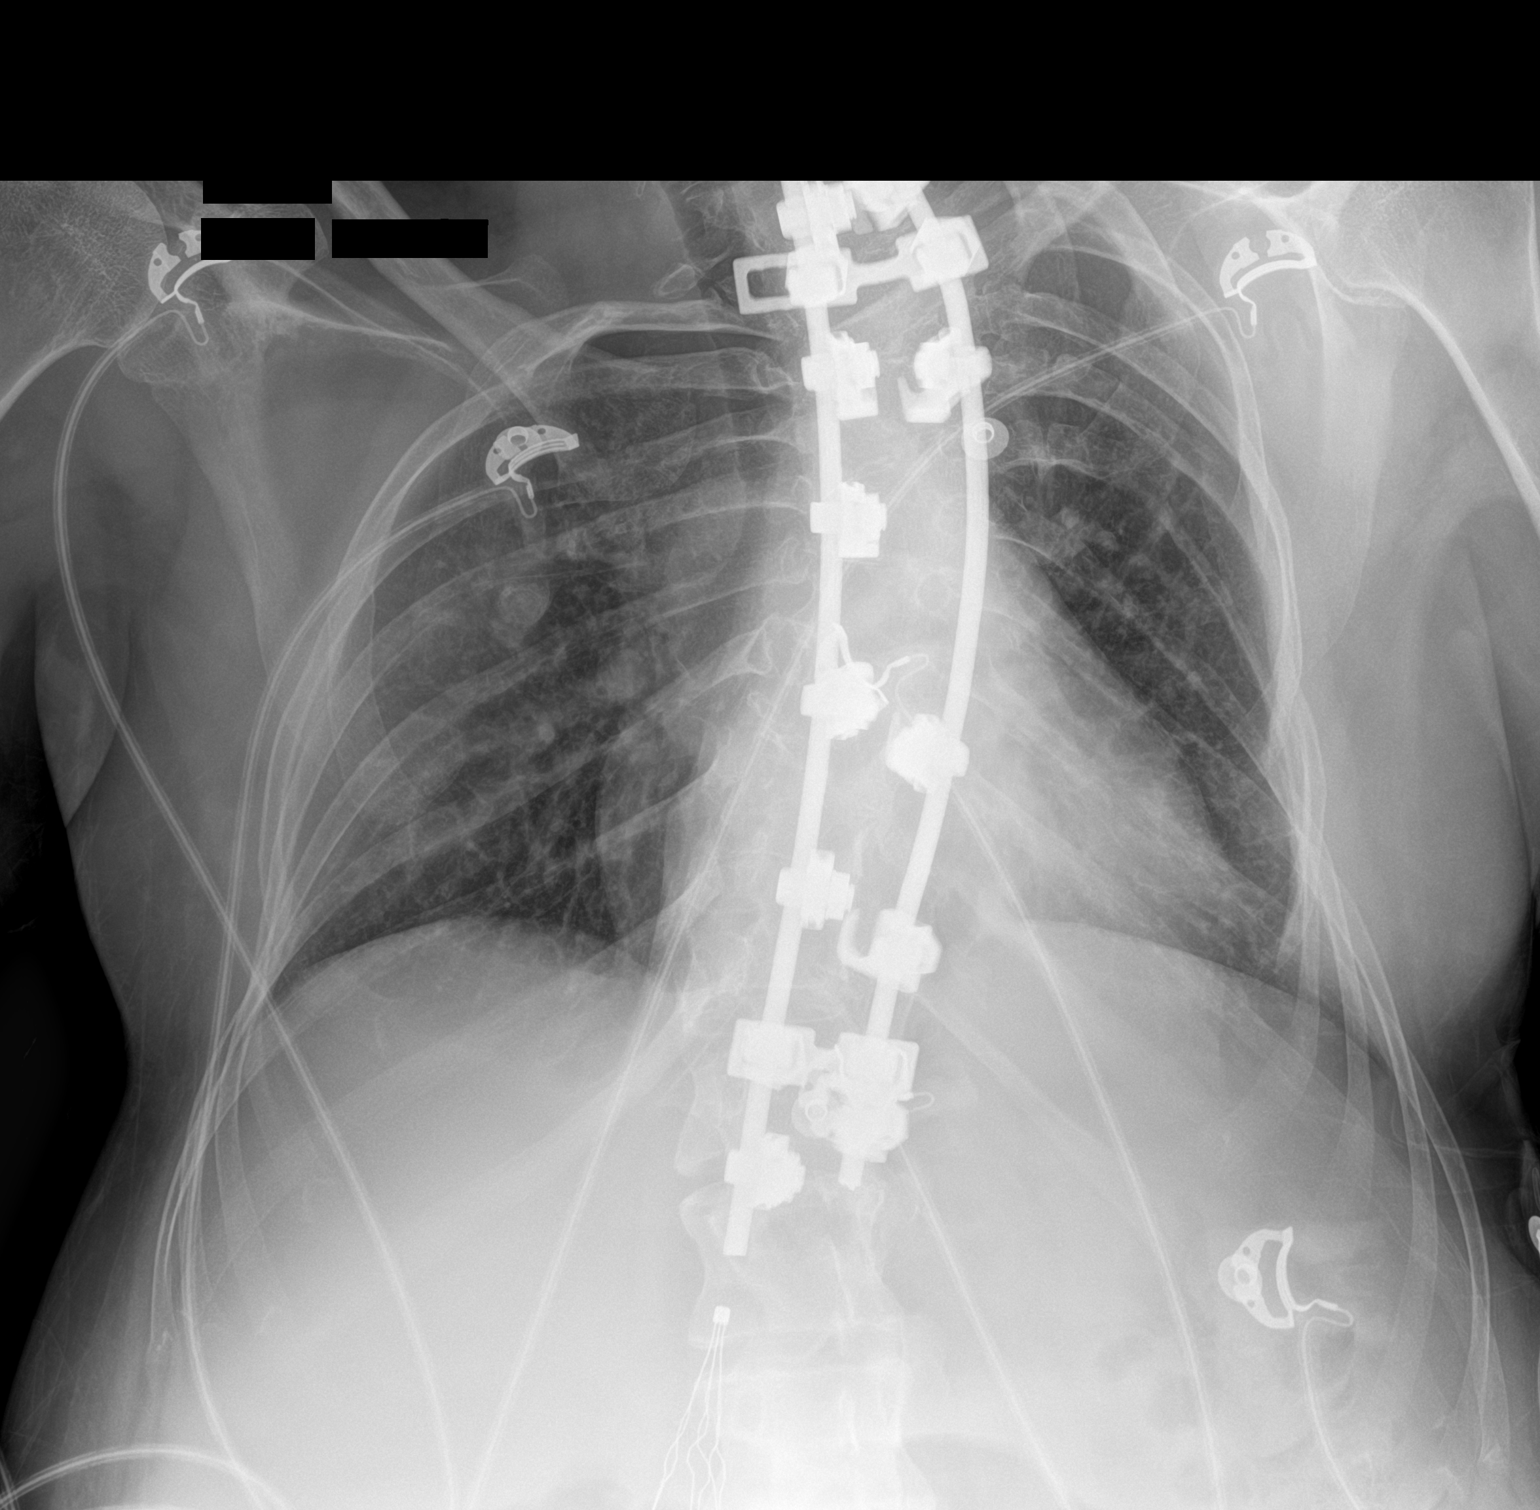

[1 of 1 positions shown; findings below may reference images not displayed]

FINDINGS: A chronic density projects over the right mid/right upper lung zone.
There is no pneumothorax. No large pleural effusion. The cardiac
silhouette is stable. Extensive orthopedic hardware is again noted.
There is a partially visualized non retrievable IVC filter
projecting over the upper abdomen. No acute osseous abnormality.
IMPRESSION: No active disease.

## 2020-09-11 IMAGING — CT CT ANGIOGRAPHY CHEST
2 of 7 series · 19 of 46 positions shown · IV contrast (APPLIED)
Comparison: 01/24/2011

CLINICAL DATA: Chest pain

EXAM:
CT ANGIOGRAPHY CHEST WITH CONTRAST
TECHNIQUE: Multidetector CT imaging of the chest was performed using the
standard protocol during bolus administration of intravenous
contrast. Multiplanar CT image reconstructions and MIPs were
obtained to evaluate the vascular anatomy.
CONTRAST:  80mL OMNIPAQUE IOHEXOL 350 MG/ML SOLN

[Series 8: thins · axial · 0.66mm/px · z∈[+1112,+1336]mm · 16 of 362 slices shown]
[im 21/362  lung]
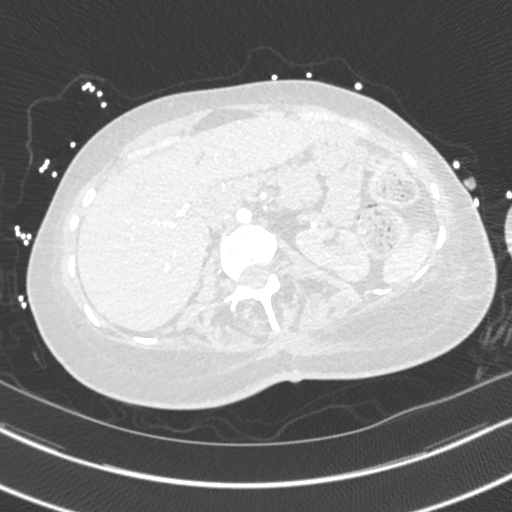
[im 41/362  soft-tissue]
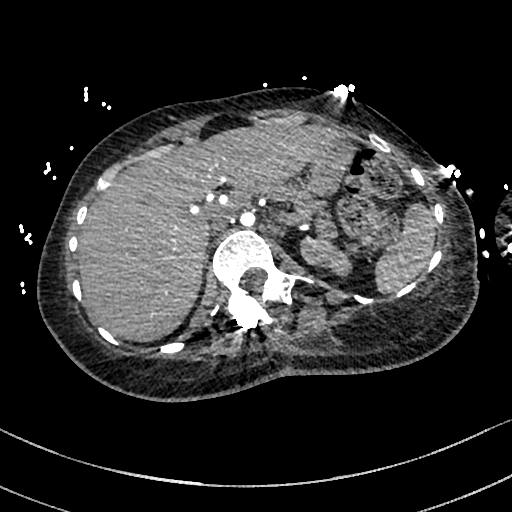
[im 61/362  lung]
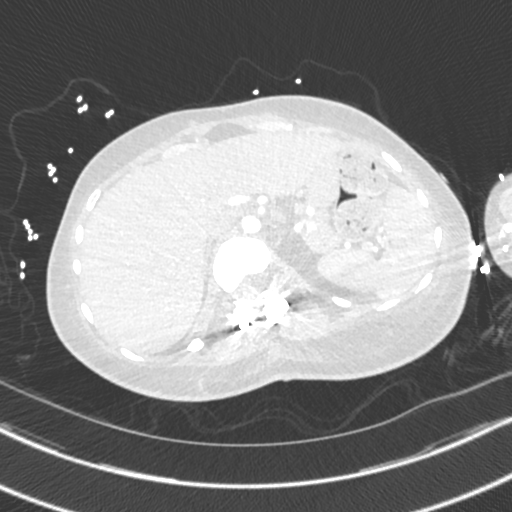
[im 81/362  soft-tissue]
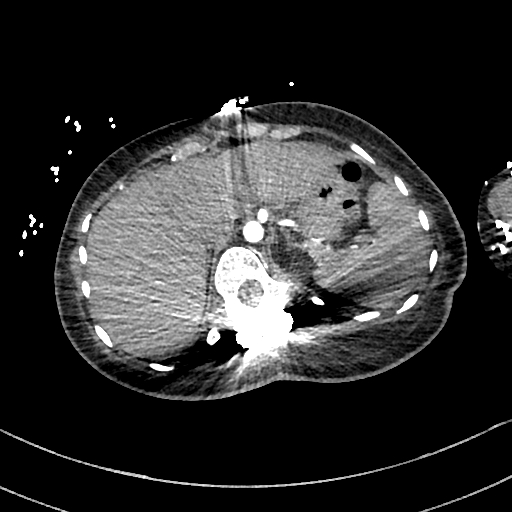
[im 101/362  lung]
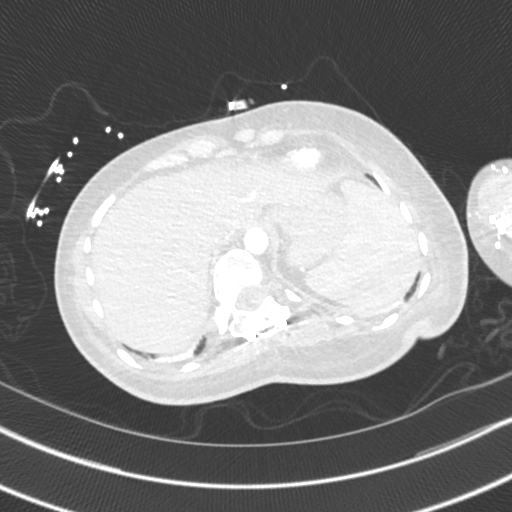
[im 121/362  soft-tissue]
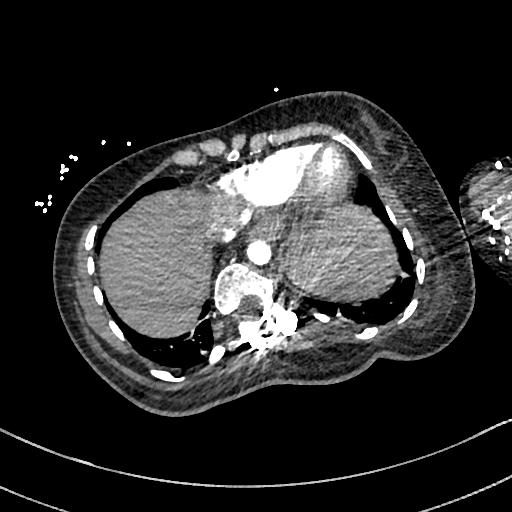
[im 141/362  lung]
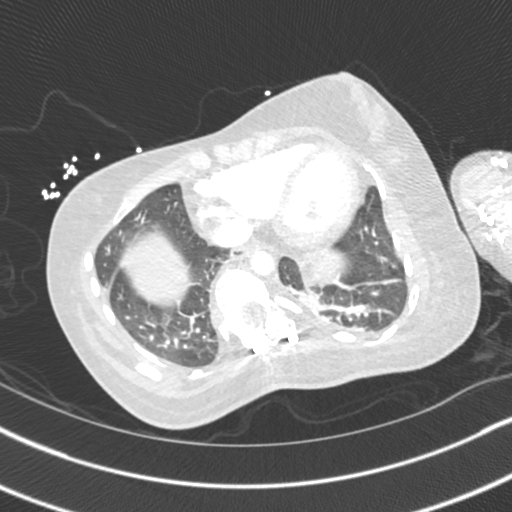
[im 161/362  soft-tissue]
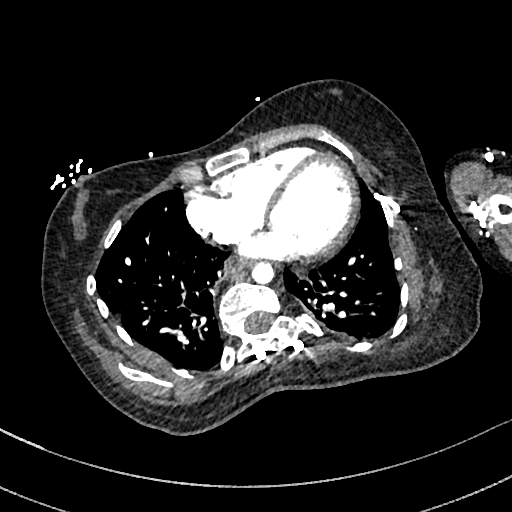
[im 201/362  lung]
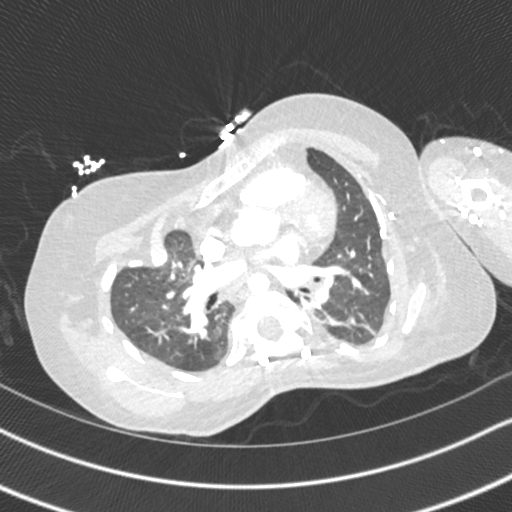
[im 221/362  soft-tissue]
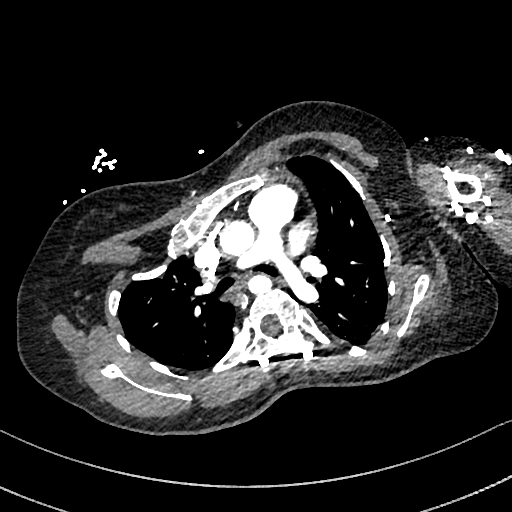
[im 241/362  lung]
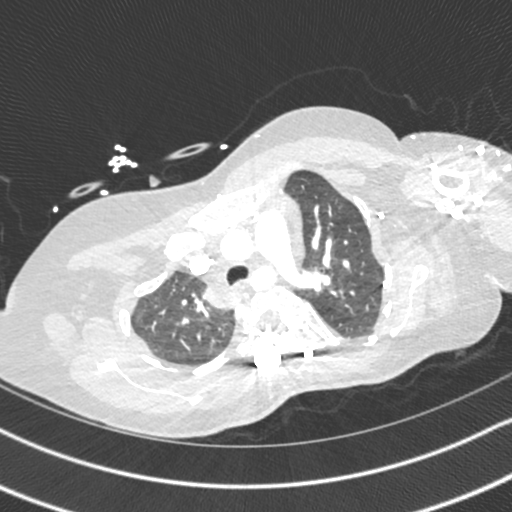
[im 261/362  soft-tissue]
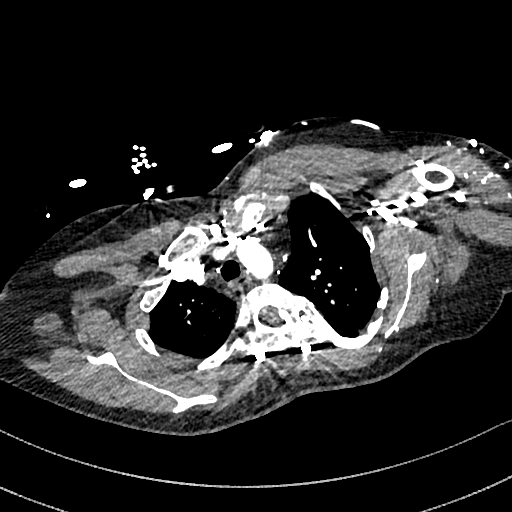
[im 281/362  lung]
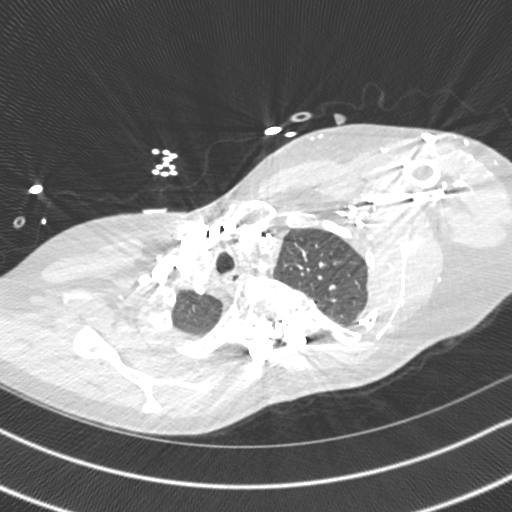
[im 301/362  soft-tissue]
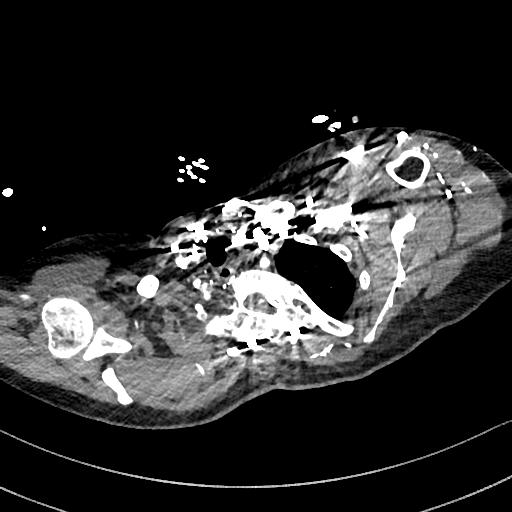
[im 321/362  lung]
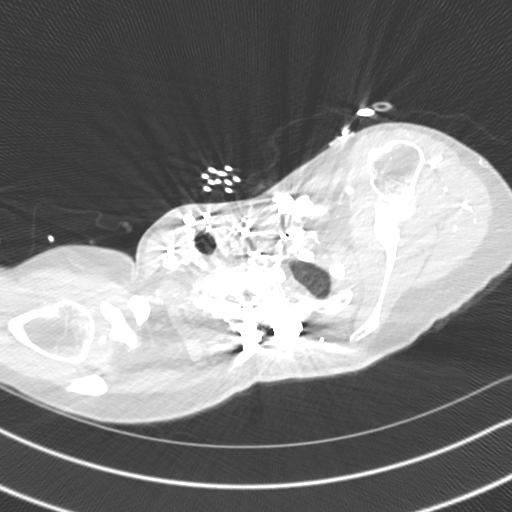
[im 341/362  soft-tissue]
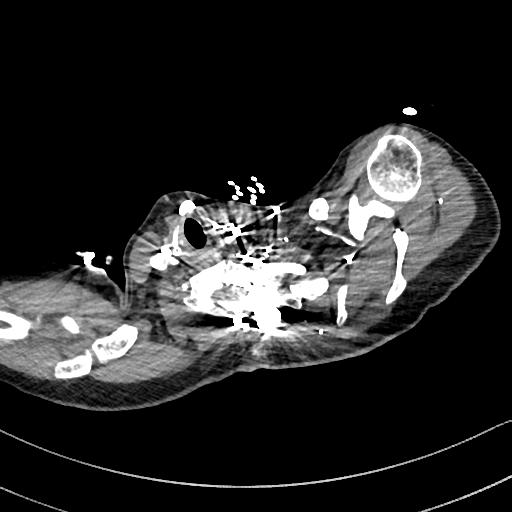

[Series 9: cor · coronal · 0.54mm/px · 3 of 119 slices shown]
[im 30/119  soft-tissue]
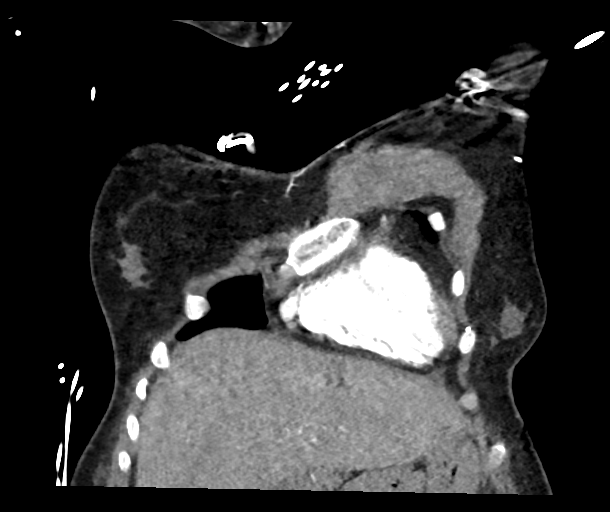
[im 60/119  soft-tissue]
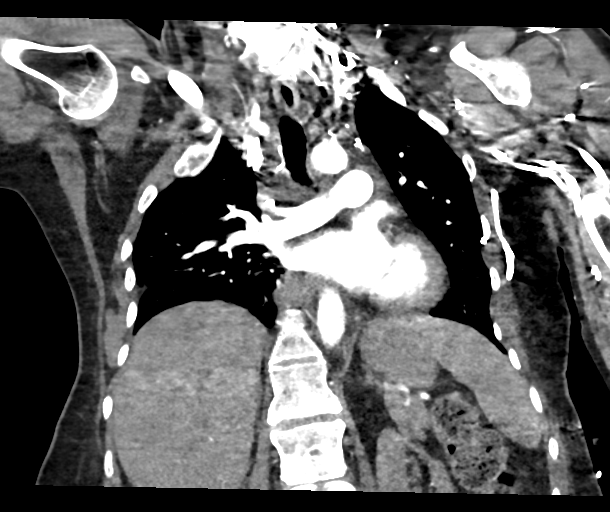
[im 89/119  soft-tissue]
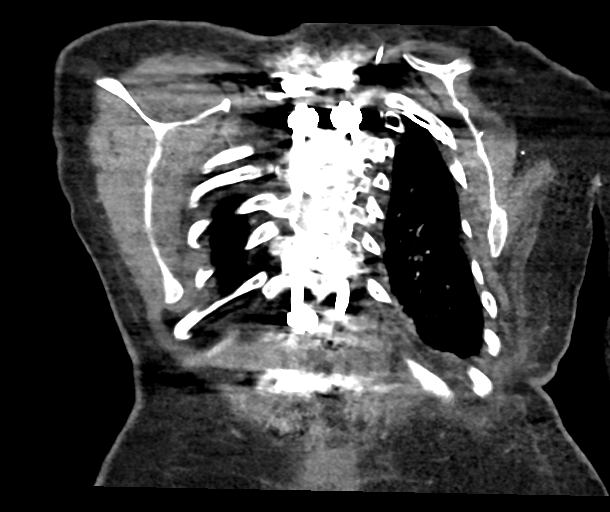

[19 of 46 positions shown; findings below may reference images not displayed]

FINDINGS: Cardiovascular: Satisfactory opacification of the pulmonary arteries
to the segmental level. No evidence of pulmonary embolism. Normal
heart size. No pericardial effusion.

Mediastinum/Nodes: Negative for adenopathy or mass

Lungs/Pleura: Bandlike opacities in the lower lungs attributed
atelectasis or scarring. There is mild air trapping. There is no
edema, consolidation, effusion, or pneumothorax.

Upper Abdomen: Partially visualized IVC filter.

Musculoskeletal: Scoliosis with Harrington rods. There is no edema,
consolidation, effusion, or pneumothorax.

Review of the MIP images confirms the above findings.
IMPRESSION: 1. Negative for pulmonary embolism or other acute finding.
2. Subsegmental atelectasis or scarring.
